# Patient Record
Sex: Male | Born: 1970 | Race: White | Hispanic: No | Smoking: Current every day smoker
Health system: Southern US, Community
[De-identification: ages and names within clinical notes are randomized; demographics above are authoritative.]

## PROBLEM LIST (undated history)

## (undated) ENCOUNTER — Emergency Department (HOSPITAL_COMMUNITY): Discharge status: Absent without leave | Payer: Self-pay

## (undated) DIAGNOSIS — F419 Anxiety disorder, unspecified: Secondary | ICD-10-CM

## (undated) DIAGNOSIS — I10 Essential (primary) hypertension: Secondary | ICD-10-CM

## (undated) DIAGNOSIS — M1990 Unspecified osteoarthritis, unspecified site: Secondary | ICD-10-CM

## (undated) DIAGNOSIS — M958 Other specified acquired deformities of musculoskeletal system: Secondary | ICD-10-CM

## (undated) DIAGNOSIS — G8929 Other chronic pain: Secondary | ICD-10-CM

## (undated) DIAGNOSIS — I639 Cerebral infarction, unspecified: Secondary | ICD-10-CM

## (undated) DIAGNOSIS — M76821 Posterior tibial tendinitis, right leg: Secondary | ICD-10-CM

## (undated) DIAGNOSIS — M25371 Other instability, right ankle: Secondary | ICD-10-CM

## (undated) DIAGNOSIS — I739 Peripheral vascular disease, unspecified: Secondary | ICD-10-CM

## (undated) DIAGNOSIS — G4700 Insomnia, unspecified: Secondary | ICD-10-CM

## (undated) DIAGNOSIS — M7671 Peroneal tendinitis, right leg: Secondary | ICD-10-CM

## (undated) HISTORY — DX: Other chronic pain: G89.29

## (undated) HISTORY — DX: Anxiety disorder, unspecified: F41.9

## (undated) HISTORY — PX: LEG SURGERY: SHX1003

## (undated) HISTORY — PX: HERNIA REPAIR: SHX51

---
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
## 2003-02-04 ENCOUNTER — Emergency Department (HOSPITAL_COMMUNITY): Admission: EM | Admit: 2003-02-04 | Discharge: 2003-02-04 | Payer: Self-pay | Admitting: Emergency Medicine

## 2003-02-15 ENCOUNTER — Ambulatory Visit (HOSPITAL_BASED_OUTPATIENT_CLINIC_OR_DEPARTMENT_OTHER): Admission: RE | Admit: 2003-02-15 | Discharge: 2003-02-15 | Payer: Self-pay | Admitting: General Surgery

## 2003-03-10 ENCOUNTER — Inpatient Hospital Stay (HOSPITAL_COMMUNITY): Admission: EM | Admit: 2003-03-10 | Discharge: 2003-03-13 | Payer: Self-pay | Admitting: Emergency Medicine

## 2004-08-10 ENCOUNTER — Emergency Department (HOSPITAL_COMMUNITY): Admission: EM | Admit: 2004-08-10 | Discharge: 2004-08-10 | Payer: Self-pay | Admitting: Emergency Medicine

## 2004-09-05 ENCOUNTER — Emergency Department (HOSPITAL_COMMUNITY): Admission: EM | Admit: 2004-09-05 | Discharge: 2004-09-05 | Payer: Self-pay | Admitting: Emergency Medicine

## 2005-10-02 IMAGING — CR DG CLAVICLE*R*
2 series · 2 of 2 positions shown · non-contrast
Comparison: None.

CLINICAL DATA: Fall three weeks ago.  Now with right shoulder and neck pain.
 RIGHT CLAVICLE ? 2 VIEW:

[w clavicle ap right *]
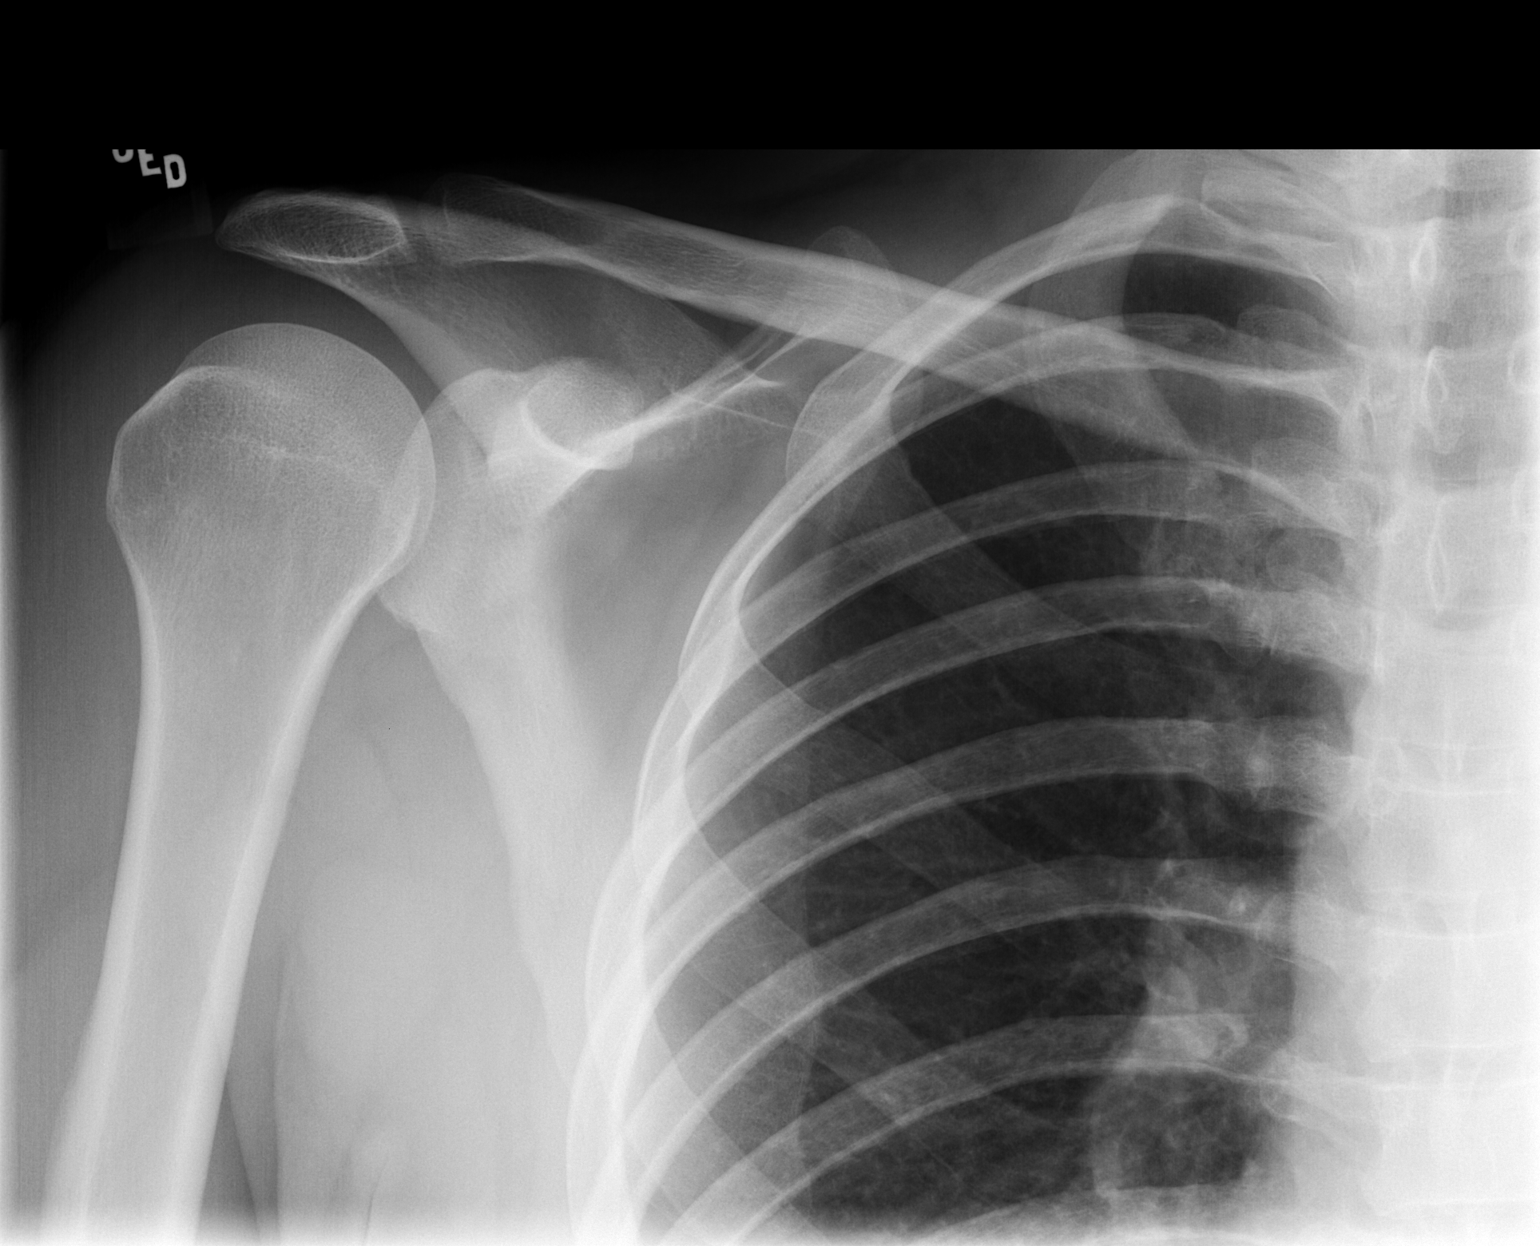

[w clavicle tangential right *]
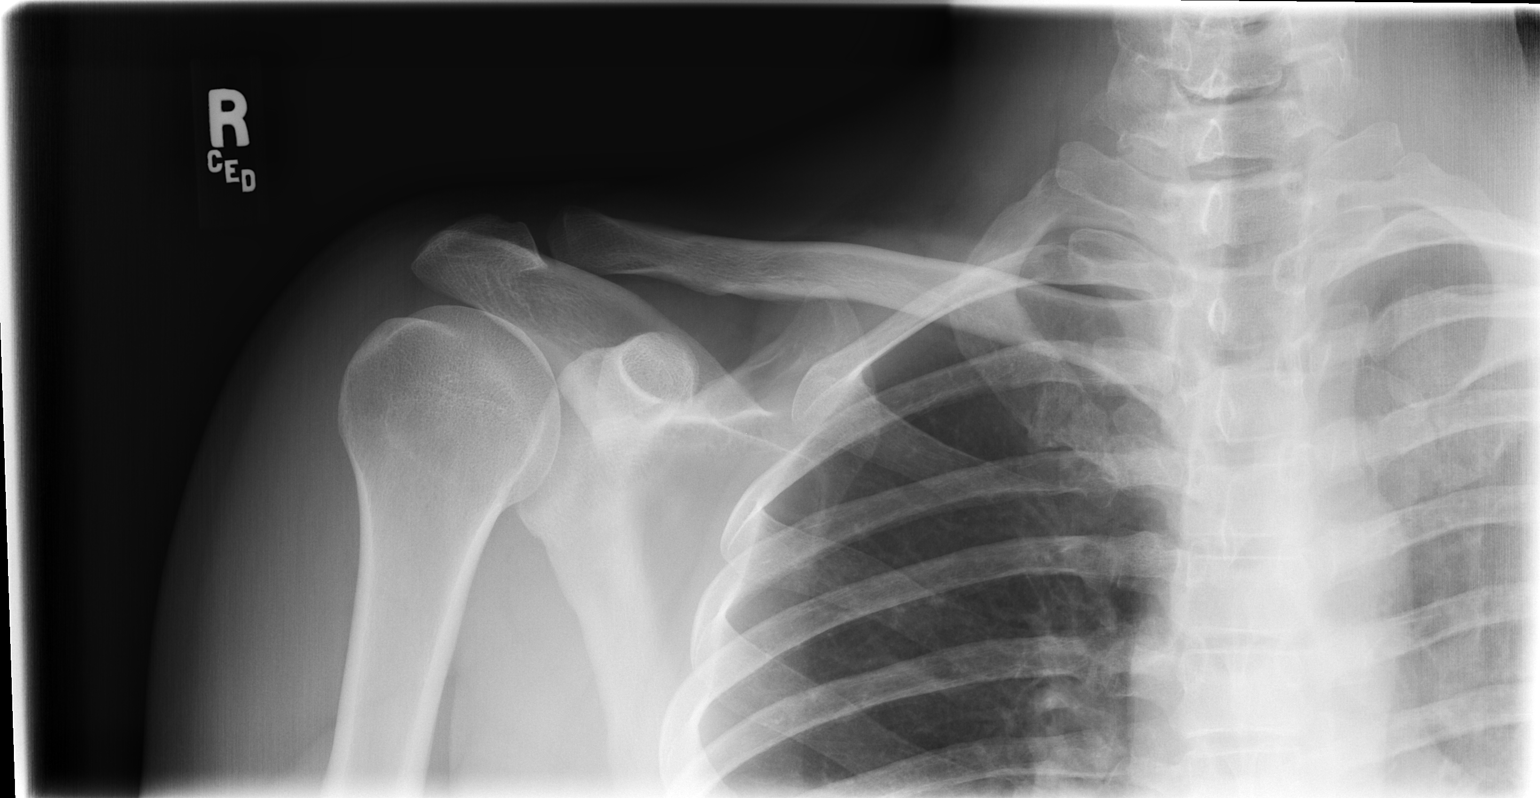

[2 of 2 positions shown; findings below may reference images not displayed]

FINDINGS: No fractures or dislocations are identified.
IMPRESSION: Normal right shoulder.

## 2008-03-17 ENCOUNTER — Emergency Department (HOSPITAL_COMMUNITY): Admission: EM | Admit: 2008-03-17 | Discharge: 2008-03-17 | Payer: Self-pay | Admitting: Emergency Medicine

## 2008-05-22 ENCOUNTER — Emergency Department (HOSPITAL_COMMUNITY): Admission: EM | Admit: 2008-05-22 | Discharge: 2008-05-22 | Payer: Self-pay | Admitting: Emergency Medicine

## 2009-04-24 ENCOUNTER — Emergency Department (HOSPITAL_COMMUNITY): Admission: EM | Admit: 2009-04-24 | Discharge: 2009-04-24 | Payer: Self-pay | Admitting: Emergency Medicine

## 2009-08-14 ENCOUNTER — Emergency Department (HOSPITAL_COMMUNITY): Admission: EM | Admit: 2009-08-14 | Discharge: 2009-08-14 | Payer: Self-pay | Admitting: Emergency Medicine

## 2010-07-07 LAB — POCT I-STAT, CHEM 8
Calcium, Ion: 0.87 mmol/L — ABNORMAL LOW (ref 1.12–1.32)
Chloride: 109 mEq/L (ref 96–112)
Glucose, Bld: 61 mg/dL — ABNORMAL LOW (ref 70–99)
HCT: 46 % (ref 39.0–52.0)
Hemoglobin: 15.6 g/dL (ref 13.0–17.0)
Sodium: 134 mEq/L — ABNORMAL LOW (ref 135–145)
TCO2: 21 mmol/L (ref 0–100)

## 2010-07-07 LAB — CBC
HCT: 42.5 % (ref 39.0–52.0)
Hemoglobin: 14.3 g/dL (ref 13.0–17.0)
MCHC: 33.7 g/dL (ref 30.0–36.0)
Platelets: 182 10*3/uL (ref 150–400)
RDW: 13.4 % (ref 11.5–15.5)
WBC: 12.2 10*3/uL — ABNORMAL HIGH (ref 4.0–10.5)

## 2010-07-07 LAB — DIFFERENTIAL
Basophils Relative: 0 % (ref 0–1)
Lymphs Abs: 2.9 10*3/uL (ref 0.7–4.0)
Monocytes Relative: 9 % (ref 3–12)
Neutrophils Relative %: 65 % (ref 43–77)

## 2010-09-02 ENCOUNTER — Emergency Department (HOSPITAL_COMMUNITY): Payer: Self-pay

## 2010-09-02 ENCOUNTER — Inpatient Hospital Stay (HOSPITAL_COMMUNITY): Payer: Self-pay

## 2010-09-02 ENCOUNTER — Inpatient Hospital Stay (HOSPITAL_COMMUNITY)
Admission: EM | Admit: 2010-09-02 | Discharge: 2010-09-04 | DRG: 494 | Disposition: A | Discharge status: Home Health | Payer: Self-pay | Source: Ambulatory Visit | Attending: Orthopedic Surgery | Admitting: Orthopedic Surgery

## 2010-09-02 DIAGNOSIS — S60229A Contusion of unspecified hand, initial encounter: Secondary | ICD-10-CM | POA: Diagnosis present

## 2010-09-02 DIAGNOSIS — G40909 Epilepsy, unspecified, not intractable, without status epilepticus: Secondary | ICD-10-CM | POA: Diagnosis present

## 2010-09-02 DIAGNOSIS — S72309A Unspecified fracture of shaft of unspecified femur, initial encounter for closed fracture: Principal | ICD-10-CM | POA: Diagnosis present

## 2010-09-02 DIAGNOSIS — F1010 Alcohol abuse, uncomplicated: Secondary | ICD-10-CM | POA: Diagnosis present

## 2010-09-02 DIAGNOSIS — F17200 Nicotine dependence, unspecified, uncomplicated: Secondary | ICD-10-CM | POA: Diagnosis present

## 2010-09-02 LAB — ETHANOL: Alcohol, Ethyl (B): 144 mg/dL — ABNORMAL HIGH (ref 0–10)

## 2010-09-02 LAB — SAMPLE TO BLOOD BANK

## 2010-09-02 LAB — COMPREHENSIVE METABOLIC PANEL
AST: 25 U/L (ref 0–37)
Albumin: 3.7 g/dL (ref 3.5–5.2)
Alkaline Phosphatase: 131 U/L — ABNORMAL HIGH (ref 39–117)
Creatinine, Ser: 0.97 mg/dL (ref 0.4–1.5)
GFR calc non Af Amer: >60 mL/min (ref >60)
Glucose, Bld: 150 mg/dL — ABNORMAL HIGH (ref 70–99)
Sodium: 141 mEq/L (ref 135–145)
Total Bilirubin: 0.2 mg/dL — ABNORMAL LOW (ref 0.3–1.2)
Total Protein: 6.8 g/dL (ref 6.0–8.3)

## 2010-09-02 LAB — POCT I-STAT, CHEM 8
Chloride: 107 meq/L (ref 96–112)
Glucose, Bld: 150 mg/dL — ABNORMAL HIGH (ref 70–99)
HCT: 48 % (ref 39.0–52.0)

## 2010-09-02 LAB — PROTIME-INR: Prothrombin Time: 13.1 seconds (ref 11.6–15.2)

## 2010-09-02 LAB — CBC
Hemoglobin: 15.8 g/dL (ref 13.0–17.0)
MCH: 33.4 pg (ref 26.0–34.0)
RDW: 12.7 % (ref 11.5–15.5)
WBC: 21.6 10*3/uL — ABNORMAL HIGH (ref 4.0–10.5)

## 2010-09-02 LAB — LACTIC ACID, PLASMA: Lactic Acid, Venous: 2 mmol/L (ref 0.5–2.2)

## 2010-09-02 LAB — SURGICAL PCR SCREEN
MRSA, PCR: NEGATIVE
Staphylococcus aureus: NEGATIVE

## 2010-09-02 MED ORDER — IOHEXOL 300 MG/ML  SOLN
100.0000 mL | Freq: Once | INTRAMUSCULAR | Status: AC | PRN
  Administered 2010-09-02: 100 mL via INTRAVENOUS

## 2010-09-03 LAB — BASIC METABOLIC PANEL
BUN: 8 mg/dL (ref 6–23)
Calcium: 7.5 mg/dL — ABNORMAL LOW (ref 8.4–10.5)
GFR calc non Af Amer: >60 mL/min (ref >60)
Glucose, Bld: 153 mg/dL — ABNORMAL HIGH (ref 70–99)
Potassium: 3.7 mEq/L (ref 3.5–5.1)

## 2010-09-03 LAB — CBC
HCT: 32.9 % — ABNORMAL LOW (ref 39.0–52.0)
MCHC: 33.4 g/dL (ref 30.0–36.0)
MCV: 95.6 fL (ref 78.0–100.0)
RDW: 13.1 % (ref 11.5–15.5)

## 2010-09-04 LAB — BASIC METABOLIC PANEL WITH GFR
BUN: 5 mg/dL — ABNORMAL LOW (ref 6–23)
CO2: 27 meq/L (ref 19–32)
Calcium: 7.9 mg/dL — ABNORMAL LOW (ref 8.4–10.5)
Chloride: 102 meq/L (ref 96–112)
Creatinine, Ser: 0.66 mg/dL (ref 0.4–1.5)
GFR calc non Af Amer: >60 mL/min
Glucose, Bld: 102 mg/dL — ABNORMAL HIGH (ref 70–99)
Potassium: 3.7 meq/L (ref 3.5–5.1)
Sodium: 136 meq/L (ref 135–145)

## 2010-09-04 LAB — CBC
HCT: 30.1 % — ABNORMAL LOW (ref 39.0–52.0)
Hemoglobin: 10.5 g/dL — ABNORMAL LOW (ref 13.0–17.0)
MCH: 32.7 pg (ref 26.0–34.0)
MCHC: 34.9 g/dL (ref 30.0–36.0)
MCV: 93.8 fL (ref 78.0–100.0)
Platelets: 159 K/uL (ref 150–400)
RBC: 3.21 MIL/uL — ABNORMAL LOW (ref 4.22–5.81)
RDW: 12.7 % (ref 11.5–15.5)
WBC: 8.5 K/uL (ref 4.0–10.5)

## 2010-09-04 NOTE — Op Note (Signed)
NAME:  Aaron Kennedy, Aaron Kennedy                        ACCOUNT NO.:  [id number]   MEDICAL RECORD NO.:  [id number]                   PATIENT TYPE:  AMB   LOCATION:  DSC                                  FACILITY:  [facility]   PHYSICIAN:  Gita Kudo, M.D.              DATE OF BIRTH:  05/20/1970   DATE OF PROCEDURE:  02/15/2003  DATE OF DISCHARGE:                                 OPERATIVE REPORT   PROCEDURE:  Repair of left inguinal hernia - direct and indirect, with  modified Kugel two-layer mesh.   SURGEON:  Gita Kudo, M.D.   ANESTHESIA:  General endotracheal.   PREOPERATIVE DIAGNOSIS:  Left inguinal hernia.   POSTOPERATIVE DIAGNOSIS:  Left inguinal hernia.  Combined direct and  indirect.   INDICATIONS FOR PROCEDURE:  A 40 year old male with groin bulge for at least  two years' duration.  Comes in for elective repair after verifying the  hernia on physical examination.   FINDINGS:  The patient had a very large indirect hernia with a very large  sac and the contents could be totally reduced.  He also had a medium-size  direct hernia.   DESCRIPTION OF PROCEDURE:  Under satisfactory general anesthesia, having  received 1.0 gram Ancef preoperatively, the patient's abdomen was prepped  and draped in the usual sterile fashion.  A total of 30 mL of 0.5% Marcaine  with epinephrine was infiltrated during the procedure for postoperative  analgesia.  A transverse incision was made and carried down to and through  the external ring and external oblique.  Bleeders were either tied with 3-0  Vicryl or coagulated.  The cord and its contents were mobilized with the  Penrose drain and the large hernia sac identified, dissected, and ligated  high with 0 Prolene suture.  The cord structures and nerves were identified  throughout the procedure and not injured.  Then the floor of the canal was  opened from the pubis to the internal ring and the inferior epigastric  vessels identified and  divided between clamps and ties of 3-0 Vicryl.  This  was done to allow good access to the preperitoneal space.  The preperitoneal  space was then developed by finger dissection and the contents held away  with a moistened gauze.   The circular portion of the Kugel mesh was then anchored to Cooper's  ligament with a 0 Prolene suture and unfolded laterally and medially.  Then,  the mesh was placed superiorly and medially after the moistened gauze was  removed.  The mesh lay in good position, well deployed.  A second suture was  used to approximate the mesh to the undersurface of the abdominal wall  medially and then the floor closed over the mesh taking intermittent bites  of the mesh with a running 0 Prolene which when tied at the internal ring,  rendered the ring snug and the ends were left long.  Then  the oval portion  of the mesh was tailored to fit into the floor and a slit made to go around  the cord.  It was then anchored at the internal ring with the previous  suture and tacked around the periphery with three sutures - 0 Prolene - to  the internal oblique, soft tissue near the pubis, inguinal ligament.  The  towels were then brought around the cord structures and sutured to each  other.  The wound was lavaged with saline, made hemostatic by cautery, and  then closed in layers with running 2-0 Vicryl for the external oblique, 2-0  Vicryl for the deep fascia, 3-0 Vicryl for subcu, Steri-Strips for skin.  Sterile absorbent dressing applied and the patient went to the recovery room  from the operating room in good condition.                                               Gita Kudo, M.D.    MRL/MEDQ  D:  02/15/2003  T:  02/15/2003  Job:  [id number]

## 2010-09-04 NOTE — Consult Note (Signed)
NAME:  Aaron Kennedy, Aaron Kennedy                        ACCOUNT NO.:  [id number]   MEDICAL RECORD NO.:  [id number]                   PATIENT TYPE:  INP   LOCATION:  5016                                 FACILITY:  [facility]   PHYSICIAN:  Artist Pais. Mina Marble, M.D.           DATE OF BIRTH:  1971/01/22   DATE OF CONSULTATION:  03/10/2003  DATE OF DISCHARGE:                                   CONSULTATION   REQUESTING PHYSICIAN:  Doug Sou, M.D.   REASON FOR CONSULTATION:  Aaron Kennedy is a 40 year old right-hand  dominate male who presented today with bilateral lesions on his hands as  well as status post left inguinal hernia repair 3 weeks ago by Dr. Jerelene Redden with some drainage and some lesions in the vicinity of his incision  for the last week. He is an otherwise  healthy 40 year old male.   ALLERGIES:  No known drug allergies.   CURRENT MEDICATIONS:  None except Percocet.   PAST MEDICAL HISTORY:  No recent hospitalizations or surgeries except for  the previously mentioned inguinal hernia repair around 3 weeks ago.   FAMILY HISTORY:  Noncontributory.   SOCIAL HISTORY:  Occasional alcohol  and tobacco use.   PHYSICAL EXAMINATION:  GENERAL:  A well nourished male, pleasant, alert and  oriented x3.  EXTREMITIES:  Examination of his hands, he has bilateral lesions on his  right hand over the ulnar border of the 5th digit proximal phalanx. He has a  small lesion that was cultured by the ER staff that measures approximately 1  x 1. It is raised, red and again was opened and cultured by the  ER staff.  On his left hand he has a similar lesion over the left 5th digit ulnar side  at the proximal phalangeal level. It is raised, erythematous but no drainage  was noted. He also has a lesion on the long finger over the middle phalanx,  left hand, which is raised about 5 x 6 mm, erythematous but no drainage  noted. No streaks up the arms or adenopathy  bilaterally. His inguinal  hernia repair  is somewhat red and his bandage has some yellowish type  drainage and he says this has been draining for the past week or so  intermittently.   LABORATORY DATA:  White count was drawn and pending.   IMPRESSION:  A 40 year old male with possible methicillin resistant Staph  aureus infection on his hands and probably secondary to a infection to his  hernia repair. Recommendations at this point in time, his wound was  cultured. I am going to admit him for IV vancomycin for a presumed  methicillin resistant Staph aureus infection. I am going to have the general  surgeon on call for Dr. Jerelene Redden look at his left hernia incision and  will await his cultures, white count, etc.  Artist Pais Mina Marble, M.D.   MAW/MEDQ  D:  03/10/2003  T:  03/10/2003  Job:  [id number]

## 2010-09-04 NOTE — Discharge Summary (Signed)
NAME:  Aaron Kennedy, Aaron Kennedy                        ACCOUNT NO.:  [id number]   MEDICAL RECORD NO.:  [id number]                   PATIENT TYPE:  INP   LOCATION:  5016                                 FACILITY:  [facility]   PHYSICIAN:  Artist Pais. Mina Marble, M.D.           DATE OF BIRTH:  February 19, 1971   DATE OF ADMISSION:  03/10/2003  DATE OF DISCHARGE:  03/13/2003                                 DISCHARGE SUMMARY   ADMITTING DIAGNOSES:  1. Methicillin-resistant Staphylococcus aureus infection bilateral hands.  2. Status post left inguinal hernia repair three weeks ago by Dr. Gita Kudo.   DISCHARGE DIAGNOSES:  1. Methicillin-resistant Staphylococcus aureus infection bilateral hands.  2. Status post left inguinal hernia repair three weeks ago by Dr. Gita Kudo.   DISCHARGE CONDITION:  Stable and improved.   BRIEF HISTORY:  The patient is a 40 year old right-hand dominant white male  who was seen in the emergency room with bilateral lesions on his hands,  status post left inguinal hernia repair by Dr. Gita Kudo.  He has  some drainage from the lesions, and he also has some drainage and redness in  his incision area of his left inguinal hernia repair.  Dr. Dairl Ponder  was consulted from the emergency room for evaluation of his hand lesions.   PERTINENT LABS:  WBC 10.0 on November 21.  On November 23, it was 9.8.  Hemoglobin 13.8, hematocrit 40.1.  Sodium 139, potassium 4.0.  Cultures were  positive for methicillin-resistant staphylococcus aureus from his right-hand  culture.   HOSPITAL COURSE:  The patient was admitted on March 10, 2003 and placed  on IV vancomycin for methicillin-resistant staphylococcus aureus infection.  Dr. Gita Kudo was consulted for evaluation of his left inguinal  hernia incision.  Infectious disease was consulted for assistance with  antibiotic coverage.  On hospital day number 1, the patient was feeling  better.  He had  decreased swelling of his hands, decreased redness.  Several  small pustules on his hands were not draining anything.  He had increased  range of motion bilateral hands.  WBC was 15.0, hemoglobin 13.8, hematocrit  40.1, platelet count 421,000.  He continued on his IV vancomycin.  Infectious disease was consulted.  He remained afebrile.  Vital signs  stable.  He was ready for discharge home on March 13, 2003.  IV medicines  were discontinued.   DISCHARGE MEDICATIONS:  He was discharged home on p.o. doxycycline 100 mg  b.i.d. times two weeks.   FOLLOWUP:  1. On the following Tuesday with Dr. Molli Hazard A. Weingold in their office     [phone]), call for an appointment.  2. He is to follow up with Dr. Gita Kudo regarding his left inguinal     hernia repair.  3. Contact our office prior to followup if he has any questions or concerns.   DISCHARGE  INSTRUCTIONS:  Keep lesions clean and dry.   DISCHARGE CONDITION:  Stable and improved.      Joellyn Rued, P.A. [facility]                    Matthew A. Mina Marble, M.D.    EW/MEDQ  D:  05/08/2003  T:  05/09/2003  Job:  [id number]
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
## 2011-05-18 ENCOUNTER — Telehealth: Payer: Self-pay

## 2011-05-18 NOTE — Telephone Encounter (Signed)
.  [facility] Pt requests refill on tramadol Uses [facility] on [address]: [phone]  bf

## 2011-05-19 NOTE — Telephone Encounter (Signed)
Spoke with pt and informed RF was sent to pharmacy yesterday by computer. Pt thanked [place].

## 2011-06-18 ENCOUNTER — Other Ambulatory Visit: Payer: Self-pay | Admitting: Family Medicine

## 2011-07-19 ENCOUNTER — Other Ambulatory Visit: Payer: Self-pay | Admitting: Physician Assistant

## 2011-08-19 ENCOUNTER — Other Ambulatory Visit: Payer: Self-pay | Admitting: Physician Assistant

## 2011-09-19 ENCOUNTER — Other Ambulatory Visit: Payer: Self-pay | Admitting: Physician Assistant

## 2012-04-28 ENCOUNTER — Other Ambulatory Visit: Payer: Self-pay | Admitting: Family Medicine

## 2012-04-28 NOTE — Telephone Encounter (Signed)
Pt has questions about refill for amitriptolyne  Pharmacy: [facility] [address]  bf

## 2012-04-28 NOTE — Telephone Encounter (Signed)
What are pt's questions?

## 2012-05-05 NOTE — Telephone Encounter (Signed)
Called patient. Unsure why this has not been called sooner. Message was in Rx pool. He states Dr Milus Glazier normally gives him a 1 yr supply. He states he has no questions, he just needs a refill on this please advise.

## 2012-06-13 ENCOUNTER — Other Ambulatory Visit: Payer: Self-pay | Admitting: Family Medicine

## 2012-06-14 ENCOUNTER — Other Ambulatory Visit: Payer: Self-pay | Admitting: Family Medicine

## 2012-07-07 ENCOUNTER — Other Ambulatory Visit: Payer: Self-pay | Admitting: Family Medicine

## 2012-07-08 ENCOUNTER — Telehealth: Payer: Self-pay

## 2012-07-08 NOTE — Telephone Encounter (Signed)
Pt is needing Rx refill on MRSA Medication, the pharmacy has already faxed over a request.

## 2012-07-08 NOTE — Telephone Encounter (Signed)
Does pt need to RTC? 

## 2012-07-08 NOTE — Telephone Encounter (Signed)
Needs OV, hasn't been seen in Epic

## 2012-07-09 NOTE — Telephone Encounter (Signed)
Spoke with pt advised to RTC. Pt understood.

## 2013-02-06 ENCOUNTER — Encounter (HOSPITAL_COMMUNITY): Payer: Self-pay | Admitting: Emergency Medicine

## 2013-02-06 ENCOUNTER — Emergency Department (HOSPITAL_COMMUNITY)
Admission: EM | Admit: 2013-02-06 | Discharge: 2013-02-06 | Disposition: A | Discharge status: Home / Self Care | Payer: No Typology Code available for payment source | Attending: Emergency Medicine | Admitting: Emergency Medicine

## 2013-02-06 DIAGNOSIS — R51 Headache: Secondary | ICD-10-CM | POA: Insufficient documentation

## 2013-02-06 DIAGNOSIS — F17200 Nicotine dependence, unspecified, uncomplicated: Secondary | ICD-10-CM | POA: Insufficient documentation

## 2013-02-06 DIAGNOSIS — I10 Essential (primary) hypertension: Secondary | ICD-10-CM | POA: Insufficient documentation

## 2013-02-06 DIAGNOSIS — Z79899 Other long term (current) drug therapy: Secondary | ICD-10-CM | POA: Insufficient documentation

## 2013-02-06 HISTORY — DX: Essential (primary) hypertension: I10

## 2013-02-06 LAB — CBC WITH DIFFERENTIAL/PLATELET
Basophils Absolute: 0 10*3/uL (ref 0.0–0.1)
Basophils Relative: 0 % (ref 0–1)
Eosinophils Absolute: 0.3 10*3/uL (ref 0.0–0.7)
Eosinophils Relative: 3 % (ref 0–5)
HCT: 43.9 % (ref 39.0–52.0)
Hemoglobin: 15.5 g/dL (ref 13.0–17.0)
Lymphocytes Relative: 42 % (ref 12–46)
Lymphs Abs: 4.8 10*3/uL — ABNORMAL HIGH (ref 0.7–4.0)
MCH: 32.9 pg (ref 26.0–34.0)
MCHC: 35.3 g/dL (ref 30.0–36.0)
MCV: 93.2 fL (ref 78.0–100.0)
Monocytes Absolute: 0.9 10*3/uL (ref 0.1–1.0)
Monocytes Relative: 8 % (ref 3–12)
Neutro Abs: 5.4 10*3/uL (ref 1.7–7.7)
Neutrophils Relative %: 47 % (ref 43–77)
Platelets: 204 10*3/uL (ref 150–400)
RBC: 4.71 MIL/uL (ref 4.22–5.81)
RDW: 12.9 % (ref 11.5–15.5)
WBC: 11.4 10*3/uL — ABNORMAL HIGH (ref 4.0–10.5)

## 2013-02-06 LAB — COMPREHENSIVE METABOLIC PANEL
ALT: 20 U/L (ref 0–53)
AST: 15 U/L (ref 0–37)
Albumin: 3.9 g/dL (ref 3.5–5.2)
Alkaline Phosphatase: 135 U/L — ABNORMAL HIGH (ref 39–117)
BUN: 9 mg/dL (ref 6–23)
CO2: 24 mEq/L (ref 19–32)
Calcium: 9 mg/dL (ref 8.4–10.5)
Chloride: 106 mEq/L (ref 96–112)
Creatinine, Ser: 0.92 mg/dL (ref 0.50–1.35)
GFR calc Af Amer: >90 mL/min (ref >90)
GFR calc non Af Amer: >90 mL/min (ref >90)
Glucose, Bld: 109 mg/dL — ABNORMAL HIGH (ref 70–99)
Potassium: 3.7 mEq/L (ref 3.5–5.1)
Sodium: 141 mEq/L (ref 135–145)
Total Bilirubin: 0.3 mg/dL (ref 0.3–1.2)
Total Protein: 7.1 g/dL (ref 6.0–8.3)

## 2013-02-06 MED ORDER — AMLODIPINE BESYLATE 5 MG PO TABS
5.0000 mg | ORAL_TABLET | Freq: Once | ORAL | Status: AC
  Administered 2013-02-06: 5 mg via ORAL
  Filled 2013-02-06 (×3): qty 1

## 2013-02-06 MED ORDER — OXYCODONE-ACETAMINOPHEN 5-325 MG PO TABS: 1.0000 | ORAL_TABLET | ORAL | Status: DC | PRN

## 2013-02-06 NOTE — ED Notes (Signed)
MD Kohut at bedside. 

## 2013-02-06 NOTE — ED Provider Notes (Signed)
CSN: [id number]     Arrival date & time 02/06/13  1914 History   First MD Initiated Contact with Patient 02/06/13 2023     Chief Complaint  Patient presents with  . Hypertension   (Consider location/radiation/quality/duration/timing/severity/associated sxs/prior Treatment) HPI  42 year old with hypertension. Long-standing. Reports that recently his systolic blood pressures have been consistently in the 200s. Has been seen by his primary care provider recently for the same as her amlodipine. He reports only intermittent complaints though. Occasional mild headaches for the past day. Does not say that they're particularly severe and he has no neurological complaints otherwise. No chest pain or shortness of breath. No peripheral edema. No urinary complaints. Is a smoker.  Past Medical History  Diagnosis Date  . Hypertension    Past Surgical History  Procedure Laterality Date  . Hernia repair    . Leg surgery     No family history on file. History  Substance Use Topics  . Smoking status: Current Every Day Smoker  . Smokeless tobacco: Not on file  . Alcohol Use: Yes    Review of Systems  All systems reviewed and negative, other than as noted in HPI.   Allergies  Review of patient's allergies indicates no known allergies.  Home Medications   Current Outpatient Rx  Name  Route  Sig  Dispense  Refill  . acetaminophen (TYLENOL) 325 MG tablet   Oral   Take 650 mg by mouth every 6 (six) hours as needed for pain.         Marland Kitchen amitriptyline (ELAVIL) 50 MG tablet   Oral   Take 150 mg by mouth at bedtime.         Marland Kitchen amLODipine (NORVASC) 5 MG tablet   Oral   Take 5 mg by mouth daily.         Marland Kitchen gabapentin (NEURONTIN) 600 MG tablet   Oral   Take 600 mg by mouth 2 (two) times daily.          BP 187/135  Pulse 80  Temp(Src) 98.1 F (36.7 C) (Oral)  Resp 21  Ht 5\' 11"  (1.803 m)  Wt 220 lb (99.791 kg)  BMI 30.7 kg/m2  SpO2 98% Physical Exam  Nursing note and vitals  reviewed. Constitutional: He is oriented to person, place, and time. He appears well-developed and well-nourished. No distress.  HENT:  Head: Normocephalic and atraumatic.  Eyes: Conjunctivae are normal. Right eye exhibits no discharge. Left eye exhibits no discharge.  Neck: Normal range of motion. Neck supple.  Cardiovascular: Normal rate, regular rhythm and normal heart sounds.  Exam reveals no gallop and no friction rub.   No murmur heard. Pulmonary/Chest: Effort normal and breath sounds normal. No respiratory distress.  Abdominal: Soft. He exhibits no distension. There is no tenderness.  Musculoskeletal: He exhibits no edema and no tenderness.  Neurological: He is alert and oriented to person, place, and time. No cranial nerve deficit. He exhibits normal muscle tone. Coordination normal.  Skin: Skin is warm and dry.  Psychiatric: He has a normal mood and affect. His behavior is normal. Thought content normal.    ED Course  Procedures (including critical care time) Labs Review Labs Reviewed  CBC WITH DIFFERENTIAL - Abnormal; Notable for the following:    WBC 11.4 (*)    Lymphs Abs 4.8 (*)    All other components within normal limits  COMPREHENSIVE METABOLIC PANEL - Abnormal; Notable for the following:    Glucose, Bld 109 (*)  Alkaline Phosphatase 135 (*)    All other components within normal limits   Imaging Review No results found.  EKG Interpretation   None       MDM   1. Hypertension    42 year old male with asymptomatic hypertension. He has had an occasional mild headache over the past several days, but I'm not convinced that this is necessarily related to his hypertension. He has a nonfocal neurological examination. Discussed at length the need for blood pressure control and long-term complications of it. Stressed the importance of compliance with medicines. Discussed that he may potentially need further agents for increased dose of his amlodipine but that he needs  to take his medicines regularly before anyone can titrate them. Return precautions were discussed. Outpatient followup otherwise.    Raeford Razor, MD 02/08/13 [phone]

## 2013-02-06 NOTE — ED Notes (Signed)
Pt. reports elevated blood pressure with headache for past several days , pt.stated he does not take his antihypertensive medication regularly. Alert and oriented / respirations unlabored .

## 2013-05-06 ENCOUNTER — Other Ambulatory Visit: Payer: Self-pay | Admitting: Family Medicine

## 2013-05-11 ENCOUNTER — Other Ambulatory Visit: Payer: Self-pay | Admitting: Family Medicine

## 2013-05-14 ENCOUNTER — Telehealth: Payer: Self-pay

## 2013-05-14 DIAGNOSIS — G4700 Insomnia, unspecified: Secondary | ICD-10-CM

## 2013-05-14 NOTE — Telephone Encounter (Signed)
Patient says that he had a refill on his amitriptyline and it was at the pharmacy for him, he was out of town working and could not pick it up, now we have to approve the med. He does know that he needs an ov, so he was transferred to appt office, please call and let him know if we can approve this [phone]

## 2013-05-14 NOTE — Telephone Encounter (Signed)
I was trying to explain to pt that he needed to come in for an ov. It has been over a year that the patient has been in to see a dr. He called in a refill at his pharmacy and was unable to pick it up because the prescription has expired. They need us to send in a refill medication.    Patient hung up on me.

## 2013-05-15 MED ORDER — AMITRIPTYLINE HCL 50 MG PO TABS: 150.0000 mg | ORAL_TABLET | Freq: Every day | ORAL | Status: DC

## 2013-05-15 NOTE — Telephone Encounter (Signed)
Spoke with pt, he has an appointment on 05/24/2013 at 11:00 am with Dr Milus Glazier. Can he have a refill until then so he wont run out of his medication Amitriptyline.

## 2013-05-24 ENCOUNTER — Encounter: Payer: Self-pay | Admitting: Family Medicine

## 2013-05-24 ENCOUNTER — Ambulatory Visit: Payer: Self-pay | Admitting: Family Medicine

## 2013-05-24 VITALS — BP 180/130 | HR 72 | Temp 98.9°F | Resp 16 | Ht 71.0 in | Wt 222.0 lb

## 2013-05-24 DIAGNOSIS — G4700 Insomnia, unspecified: Secondary | ICD-10-CM

## 2013-05-24 DIAGNOSIS — M79606 Pain in leg, unspecified: Secondary | ICD-10-CM

## 2013-05-24 DIAGNOSIS — B009 Herpesviral infection, unspecified: Secondary | ICD-10-CM

## 2013-05-24 DIAGNOSIS — M79609 Pain in unspecified limb: Secondary | ICD-10-CM

## 2013-05-24 DIAGNOSIS — G8929 Other chronic pain: Secondary | ICD-10-CM

## 2013-05-24 DIAGNOSIS — I10 Essential (primary) hypertension: Secondary | ICD-10-CM

## 2013-05-24 MED ORDER — AMITRIPTYLINE HCL 50 MG PO TABS: 150.0000 mg | ORAL_TABLET | Freq: Every day | ORAL | Status: DC

## 2013-05-24 MED ORDER — OXYCODONE-ACETAMINOPHEN 5-325 MG PO TABS: 1.0000 | ORAL_TABLET | Freq: Two times a day (BID) | ORAL | Status: DC | PRN

## 2013-05-24 MED ORDER — AMLODIPINE BESYLATE 10 MG PO TABS: 10.0000 mg | ORAL_TABLET | Freq: Every day | ORAL | Status: DC

## 2013-05-24 MED ORDER — ACYCLOVIR 200 MG PO CAPS: 200.0000 mg | ORAL_CAPSULE | Freq: Every morning | ORAL | Status: DC
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
## 2014-05-27 ENCOUNTER — Other Ambulatory Visit: Payer: Self-pay | Admitting: Family Medicine

## 2014-07-24 ENCOUNTER — Ambulatory Visit: Payer: Self-pay

## 2014-10-10 ENCOUNTER — Encounter (HOSPITAL_COMMUNITY): Payer: Self-pay | Admitting: *Deleted

## 2014-10-10 ENCOUNTER — Emergency Department (HOSPITAL_COMMUNITY): Payer: Self-pay

## 2014-10-10 ENCOUNTER — Emergency Department (HOSPITAL_COMMUNITY)
Admission: EM | Admit: 2014-10-10 | Discharge: 2014-10-10 | Disposition: A | Discharge status: Home / Self Care | Payer: Self-pay | Attending: Emergency Medicine | Admitting: Emergency Medicine

## 2014-10-10 DIAGNOSIS — Z720 Tobacco use: Secondary | ICD-10-CM | POA: Insufficient documentation

## 2014-10-10 DIAGNOSIS — Z9889 Other specified postprocedural states: Secondary | ICD-10-CM | POA: Insufficient documentation

## 2014-10-10 DIAGNOSIS — M25569 Pain in unspecified knee: Secondary | ICD-10-CM

## 2014-10-10 DIAGNOSIS — Z79899 Other long term (current) drug therapy: Secondary | ICD-10-CM | POA: Insufficient documentation

## 2014-10-10 DIAGNOSIS — Z87828 Personal history of other (healed) physical injury and trauma: Secondary | ICD-10-CM | POA: Insufficient documentation

## 2014-10-10 DIAGNOSIS — G8929 Other chronic pain: Secondary | ICD-10-CM | POA: Insufficient documentation

## 2014-10-10 DIAGNOSIS — I10 Essential (primary) hypertension: Secondary | ICD-10-CM | POA: Insufficient documentation

## 2014-10-10 DIAGNOSIS — M25562 Pain in left knee: Secondary | ICD-10-CM | POA: Insufficient documentation

## 2014-10-10 NOTE — ED Notes (Signed)
Pt left with all belongings and refused wheelchair. 
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
## 2014-10-10 NOTE — ED Notes (Signed)
Pt to xray
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
## 2014-10-10 NOTE — Discharge Instructions (Signed)
Arthralgia °Your caregiver has diagnosed you as suffering from an arthralgia. Arthralgia means there is pain in a joint. This can come from many reasons including: °· Bruising the joint which causes soreness (inflammation) in the joint. °· Wear and tear on the joints which occur as we grow older (osteoarthritis). °· Overusing the joint. °· Various forms of arthritis. °· Infections of the joint. °Regardless of the cause of pain in your joint, most of these different pains respond to anti-inflammatory drugs and rest. The exception to this is when a joint is infected, and these cases are treated with antibiotics, if it is a bacterial infection. °HOME CARE INSTRUCTIONS  °· Rest the injured area for as long as directed by your caregiver. Then slowly start using the joint as directed by your caregiver and as the pain allows. Crutches as directed may be useful if the ankles, knees or hips are involved. If the knee was splinted or casted, continue use and care as directed. If an stretchy or elastic wrapping bandage has been applied today, it should be removed and re-applied every 3 to 4 hours. It should not be applied tightly, but firmly enough to keep swelling down. Watch toes and feet for swelling, bluish discoloration, coldness, numbness or excessive pain. If any of these problems (symptoms) occur, remove the ace bandage and re-apply more loosely. If these symptoms persist, contact your caregiver or return to this location. °· For the first 24 hours, keep the injured extremity elevated on pillows while lying down. °· Apply ice for 15-20 minutes to the sore joint every couple hours while awake for the first half day. Then 03-04 times per day for the first 48 hours. Put the ice in a plastic bag and place a towel between the bag of ice and your skin. °· Wear any splinting, casting, elastic bandage applications, or slings as instructed. °· Only take over-the-counter or prescription medicines for pain, discomfort, or fever as  directed by your caregiver. Do not use aspirin immediately after the injury unless instructed by your physician. Aspirin can cause increased bleeding and bruising of the tissues. °· If you were given crutches, continue to use them as instructed and do not resume weight bearing on the sore joint until instructed. °Persistent pain and inability to use the sore joint as directed for more than 2 to 3 days are warning signs indicating that you should see a caregiver for a follow-up visit as soon as possible. Initially, a hairline fracture (break in bone) may not be evident on X-rays. Persistent pain and swelling indicate that further evaluation, non-weight bearing or use of the joint (use of crutches or slings as instructed), or further X-rays are indicated. X-rays may sometimes not show a small fracture until a week or 10 days later. Make a follow-up appointment with your own caregiver or one to whom we have referred you. A radiologist (specialist in reading X-rays) may read your X-rays. Make sure you know how you are to obtain your X-ray results. Do not assume everything is normal if you do not hear from us. °SEEK MEDICAL CARE IF: °Bruising, swelling, or pain increases. °SEEK IMMEDIATE MEDICAL CARE IF:  °· Your fingers or toes are numb or blue. °· The pain is not responding to medications and continues to stay the same or get worse. °· The pain in your joint becomes severe. °· You develop a fever over 102° F (38.9° C). °· It becomes impossible to move or use the joint. °MAKE SURE YOU:  °·   Understand these instructions. °· Will watch your condition. °· Will get help right away if you are not doing well or get worse. °Document Released: 04/05/2005 Document Revised: 06/28/2011 Document Reviewed: 11/22/2007 °ExitCare® Patient Information ©2015 [employer]. This information is not intended to replace advice given to you by your health care provider. Make sure you discuss any questions you have with your health care  provider. ° °Knee Pain °The knee is the complex joint between your thigh and your lower leg. It is made up of bones, tendons, ligaments, and cartilage. The bones that make up the knee are: °· The femur in the thigh. °· The tibia and fibula in the lower leg. °· The patella or kneecap riding in the groove on the lower femur. °CAUSES  °Knee pain is a common complaint with many causes. A few of these causes are: °· Injury, such as: °¨ A ruptured ligament or tendon injury. °¨ Torn cartilage. °· Medical conditions, such as: °¨ Gout °¨ Arthritis °¨ Infections °· Overuse, over training, or overdoing a physical activity. °Knee pain can be minor or severe. Knee pain can accompany debilitating injury. Minor knee problems often respond well to self-care measures or get well on their own. More serious injuries may need medical intervention or even surgery. °SYMPTOMS °The knee is complex. Symptoms of knee problems can vary widely. Some of the problems are: °· Pain with movement and weight bearing. °· Swelling and tenderness. °· Buckling of the knee. °· Inability to straighten or extend your knee. °· Your knee locks and you cannot straighten it. °· Warmth and redness with pain and fever. °· Deformity or dislocation of the kneecap. °DIAGNOSIS  °Determining what is wrong may be very straight forward such as when there is an injury. It can also be challenging because of the complexity of the knee. Tests to make a diagnosis may include: °· Your caregiver taking a history and doing a physical exam. °· Routine X-rays can be used to rule out other problems. X-rays will not reveal a cartilage tear. Some injuries of the knee can be diagnosed by: °¨ Arthroscopy a surgical technique by which a small video camera is inserted through tiny incisions on the sides of the knee. This procedure is used to examine and repair internal knee joint problems. Tiny instruments can be used during arthroscopy to repair the torn knee cartilage  (meniscus). °¨ Arthrography is a radiology technique. A contrast liquid is directly injected into the knee joint. Internal structures of the knee joint then become visible on X-ray film. °¨ An MRI scan is a non X-ray radiology procedure in which magnetic fields and a computer produce two- or three-dimensional images of the inside of the knee. Cartilage tears are often visible using an MRI scanner. MRI scans have largely replaced arthrography in diagnosing cartilage tears of the knee. °· Blood work. °· Examination of the fluid that helps to lubricate the knee joint (synovial fluid). This is done by taking a sample out using a needle and a syringe. °TREATMENT °The treatment of knee problems depends on the cause. Some of these treatments are: °· Depending on the injury, proper casting, splinting, surgery, or physical therapy care will be needed. °· Give yourself adequate recovery time. Do not overuse your joints. If you begin to get sore during workout routines, back off. Slow down or do fewer repetitions. °· For repetitive activities such as cycling or running, maintain your strength and nutrition. °· Alternate muscle groups. For example, if you are a   weight lifter, work the upper body on one day and the lower body the next. °· Either tight or weak muscles do not give the proper support for your knee. Tight or weak muscles do not absorb the stress placed on the knee joint. Keep the muscles surrounding the knee strong. °· Take care of mechanical problems. °¨ If you have flat feet, orthotics or special shoes may help. See your caregiver if you need help. °¨ Arch supports, sometimes with wedges on the inner or outer aspect of the heel, can help. These can shift pressure away from the side of the knee most bothered by osteoarthritis. °¨ A brace called an "unloader" brace also may be used to help ease the pressure on the most arthritic side of the knee. °· If your caregiver has prescribed crutches, braces, wraps or ice,  use as directed. The acronym for this is PRICE. This means protection, rest, ice, compression, and elevation. °· Nonsteroidal anti-inflammatory drugs (NSAIDs), can help relieve pain. But if taken immediately after an injury, they may actually increase swelling. Take NSAIDs with food in your stomach. Stop them if you develop stomach problems. Do not take these if you have a history of ulcers, stomach pain, or bleeding from the bowel. Do not take without your caregiver's approval if you have problems with fluid retention, heart failure, or kidney problems. °· For ongoing knee problems, physical therapy may be helpful. °· Glucosamine and chondroitin are over-the-counter dietary supplements. Both may help relieve the pain of osteoarthritis in the knee. These medicines are different from the usual anti-inflammatory drugs. Glucosamine may decrease the rate of cartilage destruction. °· Injections of a corticosteroid drug into your knee joint may help reduce the symptoms of an arthritis flare-up. They may provide pain relief that lasts a few months. You may have to wait a few months between injections. The injections do have a small increased risk of infection, water retention, and elevated blood sugar levels. °· Hyaluronic acid injected into damaged joints may ease pain and provide lubrication. These injections may work by reducing inflammation. A series of shots may give relief for as long as 6 months. °· Topical painkillers. Applying certain ointments to your skin may help relieve the pain and stiffness of osteoarthritis. Ask your pharmacist for suggestions. Many over the-counter products are approved for temporary relief of arthritis pain. °· In some countries, doctors often prescribe topical NSAIDs for relief of chronic conditions such as arthritis and tendinitis. A review of treatment with NSAID creams found that they worked as well as oral medications but without the serious side effects. °PREVENTION °· Maintain a  healthy weight. Extra pounds put more strain on your joints. °· Get strong, stay limber. Weak muscles are a common cause of knee injuries. Stretching is important. Include flexibility exercises in your workouts. °· Be smart about exercise. If you have osteoarthritis, chronic knee pain or recurring injuries, you may need to change the way you exercise. This does not mean you have to stop being active. If your knees ache after jogging or playing basketball, consider switching to swimming, water aerobics, or other low-impact activities, at least for a few days a week. Sometimes limiting high-impact activities will provide relief. °· Make sure your shoes fit well. Choose footwear that is right for your sport. °· Protect your knees. Use the proper gear for knee-sensitive activities. Use kneepads when playing volleyball or laying carpet. Buckle your seat belt every time you drive. Most shattered kneecaps occur in car accidents. °·   Rest when you are tired. °SEEK MEDICAL CARE IF:  °You have knee pain that is continual and does not seem to be getting better.  °SEEK IMMEDIATE MEDICAL CARE IF:  °Your knee joint feels hot to the touch and you have a high fever. °MAKE SURE YOU:  °· Understand these instructions. °· Will watch your condition. °· Will get help right away if you are not doing well or get worse. °Document Released: 01/31/2007 Document Revised: 06/28/2011 Document Reviewed: 01/31/2007 °ExitCare® Patient Information ©2015 [employer]. This information is not intended to replace advice given to you by your health care provider. Make sure you discuss any questions you have with your health care provider. ° °

## 2015-03-18 ENCOUNTER — Emergency Department (HOSPITAL_COMMUNITY): Payer: Self-pay

## 2015-03-18 ENCOUNTER — Observation Stay (HOSPITAL_COMMUNITY)
Admission: EM | Admit: 2015-03-18 | Discharge: 2015-03-19 | Disposition: A | Discharge status: Home / Self Care | Payer: Self-pay | Attending: Family Medicine | Admitting: Family Medicine

## 2015-03-18 ENCOUNTER — Encounter (HOSPITAL_COMMUNITY): Payer: Self-pay | Admitting: Emergency Medicine

## 2015-03-18 ENCOUNTER — Observation Stay (HOSPITAL_COMMUNITY): Payer: Self-pay

## 2015-03-18 DIAGNOSIS — F17200 Nicotine dependence, unspecified, uncomplicated: Secondary | ICD-10-CM | POA: Insufficient documentation

## 2015-03-18 DIAGNOSIS — I5040 Unspecified combined systolic (congestive) and diastolic (congestive) heart failure: Secondary | ICD-10-CM | POA: Insufficient documentation

## 2015-03-18 DIAGNOSIS — M25571 Pain in right ankle and joints of right foot: Secondary | ICD-10-CM | POA: Insufficient documentation

## 2015-03-18 DIAGNOSIS — Z79899 Other long term (current) drug therapy: Secondary | ICD-10-CM | POA: Insufficient documentation

## 2015-03-18 DIAGNOSIS — E785 Hyperlipidemia, unspecified: Secondary | ICD-10-CM | POA: Insufficient documentation

## 2015-03-18 DIAGNOSIS — G459 Transient cerebral ischemic attack, unspecified: Principal | ICD-10-CM | POA: Insufficient documentation

## 2015-03-18 DIAGNOSIS — I10 Essential (primary) hypertension: Secondary | ICD-10-CM | POA: Insufficient documentation

## 2015-03-18 DIAGNOSIS — R9082 White matter disease, unspecified: Secondary | ICD-10-CM | POA: Insufficient documentation

## 2015-03-18 DIAGNOSIS — M6289 Other specified disorders of muscle: Secondary | ICD-10-CM

## 2015-03-18 DIAGNOSIS — R531 Weakness: Secondary | ICD-10-CM | POA: Diagnosis present

## 2015-03-18 DIAGNOSIS — Z8781 Personal history of (healed) traumatic fracture: Secondary | ICD-10-CM

## 2015-03-18 LAB — I-STAT CHEM 8, ED
BUN: 8 mg/dL (ref 6–20)
CREATININE: 0.8 mg/dL (ref 0.61–1.24)
Calcium, Ion: 1.04 mmol/L — ABNORMAL LOW (ref 1.12–1.23)
Chloride: 106 mmol/L (ref 101–111)
GLUCOSE: 93 mg/dL (ref 65–99)
HEMATOCRIT: 49 % (ref 39.0–52.0)
HEMOGLOBIN: 16.7 g/dL (ref 13.0–17.0)
POTASSIUM: 4 mmol/L (ref 3.5–5.1)
Sodium: 141 mmol/L (ref 135–145)
TCO2: 23 mmol/L (ref 0–100)

## 2015-03-18 LAB — CBC
HEMATOCRIT: 46.1 % (ref 39.0–52.0)
HEMOGLOBIN: 16.1 g/dL (ref 13.0–17.0)
MCH: 33.2 pg (ref 26.0–34.0)
MCHC: 34.9 g/dL (ref 30.0–36.0)
MCV: 95.1 fL (ref 78.0–100.0)
Platelets: 182 10*3/uL (ref 150–400)
RBC: 4.85 MIL/uL (ref 4.22–5.81)
RDW: 12.4 % (ref 11.5–15.5)
WBC: 8.2 10*3/uL (ref 4.0–10.5)

## 2015-03-18 LAB — PROTIME-INR
INR: 1.08 (ref 0.00–1.49)
Prothrombin Time: 14.2 seconds (ref 11.6–15.2)

## 2015-03-18 LAB — RAPID URINE DRUG SCREEN, HOSP PERFORMED
Amphetamines: NOT DETECTED
BARBITURATES: NOT DETECTED
Benzodiazepines: NOT DETECTED
COCAINE: NOT DETECTED
OPIATES: NOT DETECTED
Tetrahydrocannabinol: POSITIVE — AB

## 2015-03-18 LAB — DIFFERENTIAL
BASOS PCT: 0 %
Basophils Absolute: 0 10*3/uL (ref 0.0–0.1)
EOS ABS: 0.2 10*3/uL (ref 0.0–0.7)
EOS PCT: 3 %
LYMPHS ABS: 2.6 10*3/uL (ref 0.7–4.0)
Lymphocytes Relative: 32 %
MONO ABS: 0.8 10*3/uL (ref 0.1–1.0)
MONOS PCT: 9 %
Neutro Abs: 4.6 10*3/uL (ref 1.7–7.7)
Neutrophils Relative %: 56 %

## 2015-03-18 LAB — COMPREHENSIVE METABOLIC PANEL
ALT: 19 U/L (ref 17–63)
ANION GAP: 5 (ref 5–15)
AST: 13 U/L — ABNORMAL LOW (ref 15–41)
Albumin: 3.7 g/dL (ref 3.5–5.0)
Alkaline Phosphatase: 103 U/L (ref 38–126)
BILIRUBIN TOTAL: 0.6 mg/dL (ref 0.3–1.2)
BUN: 7 mg/dL (ref 6–20)
CO2: 24 mmol/L (ref 22–32)
Calcium: 8.6 mg/dL — ABNORMAL LOW (ref 8.9–10.3)
Chloride: 110 mmol/L (ref 101–111)
Creatinine, Ser: 0.83 mg/dL (ref 0.61–1.24)
Glucose, Bld: 94 mg/dL (ref 65–99)
POTASSIUM: 4.2 mmol/L (ref 3.5–5.1)
Sodium: 139 mmol/L (ref 135–145)
TOTAL PROTEIN: 6.1 g/dL — AB (ref 6.5–8.1)

## 2015-03-18 LAB — APTT: aPTT: 33 seconds (ref 24–37)

## 2015-03-18 LAB — URINALYSIS, ROUTINE W REFLEX MICROSCOPIC
Bilirubin Urine: NEGATIVE
Glucose, UA: NEGATIVE mg/dL
Hgb urine dipstick: NEGATIVE
KETONES UR: NEGATIVE mg/dL
LEUKOCYTES UA: NEGATIVE
Nitrite: NEGATIVE
PROTEIN: NEGATIVE mg/dL
Specific Gravity, Urine: 1.01 (ref 1.005–1.030)
pH: 7.5 (ref 5.0–8.0)

## 2015-03-18 LAB — ETHANOL

## 2015-03-18 LAB — I-STAT TROPONIN, ED: TROPONIN I, POC: 0.01 ng/mL (ref 0.00–0.08)

## 2015-03-18 MED ORDER — ACETAMINOPHEN 325 MG PO TABS
325.0000 mg | ORAL_TABLET | Freq: Once | ORAL | Status: DC
  Filled 2015-03-18: qty 1

## 2015-03-18 MED ORDER — ASPIRIN 81 MG PO CHEW
324.0000 mg | CHEWABLE_TABLET | Freq: Once | ORAL | Status: AC
  Administered 2015-03-18: 324 mg via ORAL
  Filled 2015-03-18: qty 4

## 2015-03-18 MED ORDER — NICOTINE 21 MG/24HR TD PT24
21.0000 mg | MEDICATED_PATCH | Freq: Once | TRANSDERMAL | Status: AC
  Administered 2015-03-18: 21 mg via TRANSDERMAL
  Filled 2015-03-18: qty 1

## 2015-03-18 MED ORDER — ACETAMINOPHEN 325 MG PO TABS
650.0000 mg | ORAL_TABLET | Freq: Once | ORAL | Status: AC
  Administered 2015-03-18: 650 mg via ORAL

## 2015-03-18 MED ORDER — GABAPENTIN 300 MG PO CAPS
300.0000 mg | ORAL_CAPSULE | Freq: Three times a day (TID) | ORAL | Status: DC | PRN
  Administered 2015-03-19: 300 mg via ORAL
  Filled 2015-03-18: qty 1

## 2015-03-18 MED ORDER — NICOTINE 21 MG/24HR TD PT24: 21.0000 mg | MEDICATED_PATCH | Freq: Once | TRANSDERMAL | Status: DC

## 2015-03-18 MED ORDER — STROKE: EARLY STAGES OF RECOVERY BOOK
Freq: Once | Status: AC
  Administered 2015-03-18: 17:00:00
  Filled 2015-03-18: qty 1

## 2015-03-18 MED ORDER — ACETAMINOPHEN 325 MG PO TABS
650.0000 mg | ORAL_TABLET | Freq: Four times a day (QID) | ORAL | Status: DC | PRN
  Administered 2015-03-18: 650 mg via ORAL
  Filled 2015-03-18: qty 2

## 2015-03-18 MED ORDER — ASPIRIN EC 81 MG PO TBEC
81.0000 mg | DELAYED_RELEASE_TABLET | Freq: Every day | ORAL | Status: DC
  Administered 2015-03-19: 81 mg via ORAL
  Filled 2015-03-18: qty 1

## 2015-03-18 MED ORDER — ENOXAPARIN SODIUM 40 MG/0.4ML ~~LOC~~ SOLN
40.0000 mg | SUBCUTANEOUS | Status: DC
  Administered 2015-03-18: 40 mg via SUBCUTANEOUS
  Filled 2015-03-18: qty 0.4

## 2015-03-18 NOTE — ED Notes (Signed)
Patient still out for scan.

## 2015-03-18 NOTE — H&P (Signed)
Family Medicine Teaching [facility] Admission History and Physical Service Pager: [phone]  Patient name: Aaron Kennedy Medical record number: [id number] Date of birth: [birth year] Age: 44 y.o. Gender: male  Primary Care Provider: Pcp Not In System Consultants: none  Code Status: FULL  Chief Complaint: right sided numbness/weakness, slurred speech, and difficulty with balance .    Assessment and Plan: Aaron Kennedy is a 44 y.o. male presenting with right sided numbness/weakness, slurred speech, and difficulty with balance, suggestive of [name]. PMH is significant for HTN (noncompliant with meds), Chronic Pain (right ankle), ?HSV.   Suspected [name] with Right Sided Numbness/Weakness, slurred speech, and difficulty with balance: Now significantly improved with only some residual numbness over right lateral ankle (chronic from h/o fracture) and right fingertips. Risk factors include uncontrolled HTN without compliance to medication (due to reported dizziness), tobacco use. CT Head negative for acute findings. MRI head only showed moderately advanced cerebral white matter disease for age which may reflect chronic small vessel ischemia.  - admit to observation, telemetry, attending Dr. Randolm Idol - Neuro checks per protocol  - ASA  in ED; will start 81 mg daily 11/30, recommend he continue ASA 81 daily on discharge - allow permissive HTN for 24 hrs - ECHO and Carotid US ordered  - risk stratification labs: A1c, lipid, TSH - PT/OT/Speech consulted   Right Ankle Pain: Notes of history of injury about 1 year ago, but was not evaluated. No recent re-injury. Symptoms started along with [name] symptoms. Normal ROM with only some tenderness on lateral malleolus.  - Ordered x-ray right ankle complete - continue home Gabapentin  PRN - Tylenol PRN  Tobacco Use: almost 1ppd ~43 years.  - Nicotine patch .  HTN: 177/114 in ED which improved without treatment. Patient self discontinued home  Amlodipine due to dizziness. Most recent BP 162/87 - allow permissive HTN for 24 hrs - will follow BP - anticipate starting anti-HTN med on discharge, likely ACE vs Thiazide   FEN/GI: heart diet Prophylaxis: Lovenox SQ  Disposition: admit to teaching service for management of [name].   History of Present Illness:  Aaron Kennedy is a 44 y.o. male presenting with right sided numbness/weakness, slurred speech, and difficulty with balance .   Patient reports that he first noticed symptoms after waking up early morning 11/29 at 0330, said he "felt like right arm numbness", thought he "slept on it overnight" and got up to go smoke, but went back to sleep. He then woke up around 0700 and then realized his right arm, hand, leg, and foot were "numb", also described feeling some "numbness in tip of R-ear". He describes also feeling dizzy, difficulty with balance was "shuffling" with his gait and did not "feel right".  Never had similar episode of numbness before. No history of prior [name] / CVA. 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 He did have some subsequent seizures later and was put on Dilantin for 2 years. Last seizure was 2006. Has not had one since, and has been off Dilantin since that time.  Currently symptoms mostly resolved with only very mild residual numbness in right foot and fingertips. Does endorse some residual problem with prior broken Right ankle. Previously was prescribed Gabapentin, still takes occasional  in AM only PRN.  Does have a history of elevated BP. Was started on Amlodipine because was told had  BP up to 215/115, did not tolerate and felt dizzy after trying to take this, then self discontinued. He does check BP at home, has been checking it regularly at home, with reported values of about 120-140s/80-90s.  Admits "neck pain". Denies  any fevers/chills, headaches, vision loss  Active smoker, about 0.5ppd since age [age], about >25 year smoking history. Infrequent alcohol use, none in past few months. Admits to marijuana use. No other illicit drug use.  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  Regarding current home medications, patient has been out of Acyclovir and Elavil. Does not take Percoet. History of hernia surgery, has had some history of cellulitis and MRSA.  Review Of Systems: Per HPI Otherwise the remainder of the systems were negative.  Patient Active Problem List   Diagnosis Date Noted  . Right sided weakness 03/18/2015    Past Medical History: Past Medical History  Diagnosis Date  . Hypertension     Past Surgical History: Past Surgical History  Procedure Laterality Date  . Hernia repair    . Leg surgery      Social History: Social History  Substance Use Topics  . Smoking status: Current Every Day Smoker  . Smokeless tobacco: None  . Alcohol Use: Yes   Please also refer to relevant sections of EMR.  Family History: No family history on file.   Allergies and Medications: No Known Allergies No current facility-administered medications on file prior to encounter.   Current Outpatient Prescriptions on File Prior to Encounter  Medication Sig Dispense Refill  . gabapentin (NEURONTIN) 600 MG tablet Take 600-1,200 mg by mouth daily as needed (leg pain).     Marland Kitchen acyclovir (ZOVIRAX) 200 MG capsule Take 1 capsule (200 mg total) by mouth every morning. (Patient not taking: Reported on 03/18/2015) 90 capsule 3  . amitriptyline (ELAVIL) 50 MG tablet TAKE THREE TABLETS BY MOUTH AT BEDTIME. "OV NEEDED FOR ADDITIONAL REFILLS" (Patient not taking: Reported on 03/18/2015) 90 tablet 0  . amLODipine (NORVASC) 10 MG tablet Take 1 tablet (10 mg total) by mouth daily. (Patient not taking: Reported on 03/18/2015) 90 tablet 3  . oxyCODONE-acetaminophen  (PERCOCET/ROXICET) 5-325 MG per tablet Take 1 tablet by mouth every 12 (twelve) hours as needed. (Patient not taking: Reported on 03/18/2015) 60 tablet 0    Objective: BP 146/109 mmHg  Pulse 63  Temp(Src) 97.9 F (36.6 C) (Oral)  Resp 17  SpO2 96% Exam: General: NAD, resting comfortably  Eyes: EOMI, PERRL ENTM: MMM, uvula at midline, oropharynx normal Neck: no carotid bruits heard  Cardiovascular: RRR. No m/r/g/ Respiratory: CTAB, normal effort Abdomen: Soft, NT, ND, + bowel sounds MSK: No LE edema or calf tenderness; notes of tenderness to palpation over the right lateral malleolus, no edema noted, very mild erythema over this area, no increased warmth. No obvious deformity Skin: warm and dry; no rashes on exposed areas Neuro: CN 3-12 intact (visual fields not tested), Strength: 5/5 in upper and lower extremities bilaterally. Sensation: numbness over right lateral ankle, otherwise normal sensation. Finger to nose testing normal bilaterally; normal gait Psych: Mood and affect euthymic, normal rate and volume of speech  Labs and Imaging: CBC BMET   Recent Labs Lab 03/18/15 1014 03/18/15 1022  WBC 8.2  --   HGB 16.1 16.7  HCT 46.1 49.0  PLT 182  --     Recent Labs Lab 03/18/15 1014 03/18/15 1022  NA 139 141  K 4.2 4.0  CL 110 106  CO2 24  --   BUN 7 8  CREATININE 0.83 0.80  GLUCOSE 94 93  CALCIUM 8.6*  --      11/29 Head CT w/o contrast IMPRESSION: No acute intracranial abnormality. No definite acute cortical infarction. Mild mucosal thickening with partial opacification right sphenoid sinus.  11/29 MR Brain w/o contrast IMPRESSION: 1. No acute intracranial abnormality. 2. Moderately advanced cerebral white matter disease for age, nonspecific. This may reflect chronic small vessel ischemia in this patient with hypertension. Other considerations include the sequelae of trauma, hypercoagulable state, vasculitis, migraines, prior infection and  demyelination.  Palma Holter, MD 03/18/2015, 3:35 PM PGY-1, [facility] FPTS Intern pager: [phone], text pages welcome  Upper Level Addendum:  I have seen and evaluated this patient along with Dr. Ottie Glazier and reviewed the above note, making necessary revisions in purple.  Saralyn Pilar, DO [facility], PGY-3

## 2015-03-18 NOTE — ED Notes (Signed)
Admitting MD at the bedside.  

## 2015-03-18 NOTE — ED Notes (Signed)
Patient comes in today with complaints of numbness to right-side. Patient states when he went to bed at midnight he was fine and when he woke up at 0300 he started feeling numbness and pins. Patient denies any pain. neuro assessment showed slight facial droop and right weakness to the right lower extremities. Family states patient's speech is slurred.  Patient alert and oriented x4.

## 2015-03-18 NOTE — ED Provider Notes (Signed)
CSN: [id number]     Arrival date & time 03/18/15  0930 History   First MD Initiated Contact with Patient 03/18/15 [phone]     Chief Complaint  Patient presents with  . Weakness     (Consider location/radiation/quality/duration/timing/severity/associated sxs/prior Treatment) HPI Comments: Patient states numbness and weakness to his right side. He was normal when he went to bed around 12:30. He woke up around 3 with numbness to his right face, arm and leg. Denies pain. Family reports his speech is slurred. History of hypertension has not had medications in 1 year. No headache, chest pain, shortness of breath. Numbness and weakness have not changed since it first started. He denies any visual changes. He denies any chest pain or shortness of breath. He denies any bowel or bladder incontinence. Denies any difficulty swallowing. His family thinks his speech is slurred.  Patient is a 44 y.o. male presenting with weakness. The history is provided by the patient.  Weakness Pertinent negatives include no chest pain, no abdominal pain, no headaches and no shortness of breath.    Past Medical History  Diagnosis Date  . Hypertension    Past Surgical History  Procedure Laterality Date  . Hernia repair    . Leg surgery     No family history on file. Social History  Substance Use Topics  . Smoking status: Current Every Day Smoker  . Smokeless tobacco: None  . Alcohol Use: Yes    Review of Systems  Constitutional: Negative for fever, activity change and appetite change.  HENT: Negative for congestion.   Eyes: Negative for visual disturbance.  Respiratory: Negative for cough, chest tightness and shortness of breath.   Cardiovascular: Negative for chest pain.  Gastrointestinal: Negative for nausea, vomiting and abdominal pain.  Genitourinary: Negative for dysuria and hematuria.  Musculoskeletal: Negative for myalgias and arthralgias.  Skin: Negative for rash.  Neurological: Positive for  facial asymmetry, weakness, light-headedness and numbness. Negative for dizziness and headaches.  A complete 10 system review of systems was obtained and all systems are negative except as noted in the HPI and PMH.      Allergies  Review of patient's allergies indicates no known allergies.  Home Medications   Prior to Admission medications   Medication Sig Start Date End Date Taking? Authorizing Provider  Esomeprazole Magnesium (NEXIUM PO) Take 1 capsule by mouth daily as needed (heartburn).   Yes Historical Provider, MD  gabapentin (NEURONTIN) 600 MG tablet Take 600-1,200 mg by mouth daily as needed (leg pain).    Yes Historical Provider, MD  hydroxypropyl methylcellulose / hypromellose (ISOPTO TEARS / GONIOVISC) 2.5 % ophthalmic solution Place 1 drop into both eyes 4 (four) times daily as needed for dry eyes.   Yes Historical Provider, MD  acyclovir (ZOVIRAX) 200 MG capsule Take 1 capsule (200 mg total) by mouth every morning. Patient not taking: Reported on 03/18/2015 05/24/13   [name], MD  amitriptyline (ELAVIL) 50 MG tablet TAKE THREE TABLETS BY MOUTH AT BEDTIME. "OV NEEDED FOR ADDITIONAL REFILLS" Patient not taking: Reported on 03/18/2015 05/27/14   Chelle Jeffery, PA-C  amLODipine (NORVASC) 10 MG tablet Take 1 tablet (10 mg total) by mouth daily. Patient not taking: Reported on 03/18/2015 05/24/13   [name], MD  oxyCODONE-acetaminophen (PERCOCET/ROXICET) 5-325 MG per tablet Take 1 tablet by mouth every 12 (twelve) hours as needed. Patient not taking: Reported on 03/18/2015 05/24/13   [name], MD   BP 158/94 mmHg  Pulse 67  Temp(Src) 97.9 F (36.6  C) (Oral)  Resp 18  SpO2 95% Physical Exam  Constitutional: He is oriented to person, place, and time. He appears well-developed and well-nourished. No distress.  HENT:  Head: Normocephalic and atraumatic.  Mouth/Throat: Oropharynx is clear and moist. No oropharyngeal exudate.  Eyes: Conjunctivae and EOM are  normal. Pupils are equal, round, and reactive to light.  Neck: Normal range of motion. Neck supple.  No meningismus.  Cardiovascular: Normal rate, regular rhythm, normal heart sounds and intact distal pulses.   No murmur heard. Pulmonary/Chest: Effort normal and breath sounds normal. No respiratory distress.  Abdominal: Soft. There is no tenderness. There is no rebound and no guarding.  Musculoskeletal: Normal range of motion. He exhibits no edema or tenderness.  Neurological: He is alert and oriented to person, place, and time. A cranial nerve deficit is present. He exhibits normal muscle tone. Coordination normal.  Slight right-sided facial droop, questionable slurred speech, tongue midline. 4/5 strength of right upper extremity and right lower extremity. Pronator drift of right arm. No ataxia on finger to nose. Subjective sensory deficit on the right side compared to left.  Skin: Skin is warm.  Psychiatric: He has a normal mood and affect. His behavior is normal.  Nursing note and vitals reviewed.   ED Course  Procedures (including critical care time) Labs Review Labs Reviewed  COMPREHENSIVE METABOLIC PANEL - Abnormal; Notable for the following:    Calcium 8.6 (*)    Total Protein 6.1 (*)    AST 13 (*)    All other components within normal limits  URINE RAPID DRUG SCREEN, HOSP PERFORMED - Abnormal; Notable for the following:    Tetrahydrocannabinol POSITIVE (*)    All other components within normal limits  I-STAT CHEM 8, ED - Abnormal; Notable for the following:    Calcium, Ion 1.04 (*)    All other components within normal limits  ETHANOL  PROTIME-INR  APTT  CBC  DIFFERENTIAL  URINALYSIS, ROUTINE W REFLEX MICROSCOPIC (NOT AT [facility])  I-STAT TROPOININ, ED  CBG MONITORING, ED    Imaging Review Ct Head Wo Contrast  03/18/2015  CLINICAL DATA:  Right side weakness, facial droop, slurred speech EXAM: CT HEAD WITHOUT CONTRAST TECHNIQUE: Contiguous axial images were obtained  from the base of the skull through the vertex without intravenous contrast. COMPARISON:  09/02/2010 FINDINGS: There is mild mucosal thickening right sphenoid sinus. No intracranial hemorrhage, mass effect or midline shift. No definite acute cortical infarction. No mass lesion is noted on this unenhanced scan. No hydrocephalus. No intra or extra-axial fluid collection. Ventricular size is stable from prior exam. IMPRESSION: No acute intracranial abnormality. No definite acute cortical infarction. Mild mucosal thickening with partial opacification right sphenoid sinus. Electronically Signed   By: Natasha Mead M.D.   On: 03/18/2015 13:15   Mr Brain Wo Contrast  03/18/2015  CLINICAL DATA:  Right-sided weakness.  Right facial droop. EXAM: MRI HEAD WITHOUT CONTRAST TECHNIQUE: Multiplanar, multiecho pulse sequences of the brain and surrounding structures were obtained without intravenous contrast. COMPARISON:  Head CT 03/18/2015 FINDINGS: No acute infarct is identified. Increased signal along the anterior left aspect of the pons and medulla on axial diffusion weighted images is felt to be artifact. There is no evidence of intracranial hemorrhage, mass, midline shift, or extra-axial fluid collection. Small foci of T2 hyperintensity are scattered throughout the subcortical and deep cerebral white matter bilaterally, moderately advanced for age. Mildly dilated perivascular spaces are noted about the basal ganglia, most notably in the region of the  posterior limb of the right internal capsule. Orbits are unremarkable. Scattered mucous retention cysts and mild mucosal thickening are noted in the paranasal sinuses. Secretions are noted in the right sphenoid sinus. The mastoid air cells are clear. There is mild enlargement of the cisterna magna. Major intracranial vascular flow voids are preserved. IMPRESSION: 1. No acute intracranial abnormality. 2. Moderately advanced cerebral white matter disease for age, nonspecific. This  may reflect chronic small vessel ischemia in this patient with hypertension. Other considerations include the sequelae of trauma, hypercoagulable state, vasculitis, migraines, prior infection and demyelination. Electronically Signed   By: Sebastian Ache M.D.   On: 03/18/2015 14:56   I have personally reviewed and evaluated these images and lab results as part of my medical decision-making.   EKG Interpretation   Date/Time:  Tuesday March 18 2015 09:57:53 EST Ventricular Rate:  68 PR Interval:  166 QRS Duration: 84 QT Interval:  427 QTC Calculation: 454 R Axis:   33 Text Interpretation:  Sinus rhythm Abnormal R-wave progression, early  transition ST elevation, consider inferior injury No previous ECGs  available Confirmed by Nevia Henkin  MD, Sagrario Lineberry [phone]) on 03/18/2015 2:23:38  PM      MDM   Final diagnoses:  Right sided weakness   Right-sided numbness and weakness, last seen normal 3 AM. Concern for stroke. Code stroke not activated due to delay in presentation.  CT head is negative for hemorrhage or obvious infarct. Labs remarkable for marijuana and drug screen. Blood pressure is uncontrolled. States he has not had blood pressure medication for more than 1 year.  Concern for CVA versus [name]. His symptoms are gradually improving. Unassigned admission will be discussed with family practice residents. MRI pending.    Glynn Octave, MD 03/18/15 [phone]

## 2015-03-18 NOTE — ED Notes (Signed)
Admitting MD continues to be at the bedside

## 2015-03-18 NOTE — ED Notes (Signed)
Admitting MD just stepped out.

## 2015-03-18 NOTE — Progress Notes (Signed)
Patient arrived from ED to 5C16. Safety precautions and orders reviewed with patient. Call light within reach at this time. TELE called and applied. Will continue to monitor.   Sim Boast, RN

## 2015-03-18 NOTE — ED Notes (Signed)
Pt being transported upstairs by Zella Ball, EMT

## 2015-03-19 ENCOUNTER — Observation Stay (HOSPITAL_BASED_OUTPATIENT_CLINIC_OR_DEPARTMENT_OTHER): Payer: Self-pay

## 2015-03-19 DIAGNOSIS — I10 Essential (primary) hypertension: Secondary | ICD-10-CM

## 2015-03-19 DIAGNOSIS — I639 Cerebral infarction, unspecified: Secondary | ICD-10-CM

## 2015-03-19 DIAGNOSIS — G459 Transient cerebral ischemic attack, unspecified: Secondary | ICD-10-CM

## 2015-03-19 LAB — BASIC METABOLIC PANEL
Anion gap: 7 (ref 5–15)
BUN: 7 mg/dL (ref 6–20)
CHLORIDE: 108 mmol/L (ref 101–111)
CO2: 25 mmol/L (ref 22–32)
CREATININE: 0.98 mg/dL (ref 0.61–1.24)
Calcium: 8.6 mg/dL — ABNORMAL LOW (ref 8.9–10.3)
GFR calc Af Amer: >60 mL/min (ref >60)
GFR calc non Af Amer: >60 mL/min (ref >60)
GLUCOSE: 86 mg/dL (ref 65–99)
Potassium: 4.1 mmol/L (ref 3.5–5.1)
SODIUM: 140 mmol/L (ref 135–145)

## 2015-03-19 LAB — LIPID PANEL
Cholesterol: 189 mg/dL (ref 0–200)
HDL: 24 mg/dL — ABNORMAL LOW (ref >40)
LDL CALC: 140 mg/dL — AB (ref 0–99)
Total CHOL/HDL Ratio: 7.9 RATIO
Triglycerides: 127 mg/dL (ref <150)
VLDL: 25 mg/dL (ref 0–40)

## 2015-03-19 LAB — CBC
HCT: 46.6 % (ref 39.0–52.0)
HEMOGLOBIN: 16.1 g/dL (ref 13.0–17.0)
MCH: 33.1 pg (ref 26.0–34.0)
MCHC: 34.5 g/dL (ref 30.0–36.0)
MCV: 95.7 fL (ref 78.0–100.0)
Platelets: 165 10*3/uL (ref 150–400)
RBC: 4.87 MIL/uL (ref 4.22–5.81)
RDW: 12.5 % (ref 11.5–15.5)
WBC: 7.8 10*3/uL (ref 4.0–10.5)

## 2015-03-19 LAB — GLUCOSE, CAPILLARY: GLUCOSE-CAPILLARY: 95 mg/dL (ref 65–99)

## 2015-03-19 LAB — TSH: TSH: 1.362 u[IU]/mL (ref 0.350–4.500)

## 2015-03-19 MED ORDER — ATORVASTATIN CALCIUM 40 MG PO TABS: 40.0000 mg | ORAL_TABLET | Freq: Every day | ORAL | Status: DC

## 2015-03-19 MED ORDER — NICOTINE POLACRILEX 2 MG MT GUM
2.0000 mg | CHEWING_GUM | OROMUCOSAL | Status: DC | PRN
  Filled 2015-03-19: qty 1

## 2015-03-19 MED ORDER — METOPROLOL SUCCINATE ER 25 MG PO TB24: 25.0000 mg | ORAL_TABLET | Freq: Every day | ORAL | Status: DC

## 2015-03-19 MED ORDER — METOPROLOL SUCCINATE ER 25 MG PO TB24
25.0000 mg | ORAL_TABLET | Freq: Every day | ORAL | Status: DC
  Administered 2015-03-19: 25 mg via ORAL
  Filled 2015-03-19: qty 1

## 2015-03-19 MED ORDER — ATORVASTATIN CALCIUM 40 MG PO TABS
40.0000 mg | ORAL_TABLET | Freq: Every day | ORAL | Status: DC
Start: 2015-03-19 — End: 2015-03-19

## 2015-03-19 MED ORDER — AMITRIPTYLINE HCL 50 MG PO TABS
ORAL_TABLET | ORAL | Status: DC
Start: 2015-03-19 — End: 2015-04-22

## 2015-03-19 MED ORDER — NICOTINE 21 MG/24HR TD PT24: 21.0000 mg | MEDICATED_PATCH | Freq: Once | TRANSDERMAL | Status: DC

## 2015-03-19 MED ORDER — ENOXAPARIN SODIUM 60 MG/0.6ML ~~LOC~~ SOLN
50.0000 mg | SUBCUTANEOUS | Status: DC
  Filled 2015-03-19: qty 0.6

## 2015-03-19 MED ORDER — NICOTINE POLACRILEX 2 MG MT GUM: 2.0000 mg | CHEWING_GUM | OROMUCOSAL | Status: DC | PRN

## 2015-03-19 MED ORDER — ASPIRIN 81 MG PO TBEC: 81.0000 mg | DELAYED_RELEASE_TABLET | Freq: Every day | ORAL | Status: DC

## 2015-03-19 MED ORDER — LISINOPRIL 10 MG PO TABS: 10.0000 mg | ORAL_TABLET | Freq: Every day | ORAL | Status: DC

## 2015-03-19 MED ORDER — LISINOPRIL 10 MG PO TABS
10.0000 mg | ORAL_TABLET | Freq: Every day | ORAL | Status: DC
  Administered 2015-03-19: 10 mg via ORAL
  Filled 2015-03-19: qty 1

## 2015-03-19 NOTE — Progress Notes (Signed)
Discharge instructions discussed and reviewed with Pt and Pt's sister. Pt was informed of upcoming appointments and educated on new medication, hypertension, stroke risk factors and prevention. Pt was provided handouts to take home of information discussed and verbalized understanding. Pt had a NIH of 1. MD notified of Pt's BP 172/108 and pule 62 prior to discharge. RAC IV removed with no difficulties. Pt v/u of all instructions and stated he had no further questions.

## 2015-03-19 NOTE — Progress Notes (Signed)
Family Medicine Teaching Service Daily Progress Note Intern Pager: [phone]  Patient name: Aaron Kennedy Medical record number: [id number] Date of birth: 1970-05-31 Age: 44 y.o. Gender: male  Primary Care Provider: Pcp Not In System Consultants: none Code Status: FULL  Pt Overview and Major Events to Date:  11/29: admitted for suspected [name]  Assessment and Plan: CHRISTAPHER GILLIAN is a 44 y.o. male presenting with right sided numbness/weakness, slurred speech, and difficulty with balance, suggestive of [name]. PMH is significant for HTN (noncompliant with meds), Chronic Pain (right ankle), ?HSV.   Suspected [name] with Right Sided Numbness/Weakness, slurred speech, and difficulty with balance, Resolved: Now significantly improved with only some residual numbness over right lateral ankle (chronic from h/o fracture) - ASA  in ED; start 81 mg daily 11/30, recommend he continue ASA 81 daily on discharge - ECHO showed EF 45-50%, G1DD - Carotid US: negative  - risk stratification labs: A1c pending - TSH wnl - Lipids values give patient ASCVD 10 yr risk score of 15.3% > started Atorvastatin  (rec moderate to high intensity statin)  - PT/OT/Speech consulted: no needs currently   Newly Diagnosed Combined HF, Stable: ECHO showed  EF 45-50%, G1DD.  - started on Metoprolol XL  daily - started on Lisinopril  daily  - ASA daily - started Atorvastatin - cardiology referral as outpatient   Right Ankle Pain, Improved: Notes of history of injury about 1 year ago, but was not evaluated. No recent re-injury. Symptoms started the morning of admission. Normal ROM with only some tenderness on lateral malleolus;no edema.  - X-ray: corticated ossific fragments adjacent to the medial malleolus likely reflecting sequela of prior avulsive injury, but no acute injury noted - continue home Gabapentin  PRN - Tylenol PRN  HTN: 177/114 in ED which improved without treatment. Patient self  discontinued home Amlodipine due to dizziness. Most recent BP 145-165/92-97 - starting Lisinopril  daily  - BMET ordered for future as an outpatient; lab appointment made 12/7  Tobacco Use: almost 1ppd ~43 years.  - Nicotine patch . - Nicorete gum   FEN/GI: heart diet Prophylaxis: Lovenox SQ  Disposition: home today   Subjective:  - states symptoms are almost completely resolved. Only has some numbness around right ankle.  - ready to go home   Objective: Temp:  [97.6 F (36.4 C)-98.4 F (36.9 C)] 97.6 F (36.4 C) (11/30 0600) Pulse Rate:  [56-73] 61 (11/30 0600) Resp:  [14-23] 20 (11/30 0600) BP: (134-177)/(87-114) 153/93 mmHg (11/30 0600) SpO2:  [93 %-97 %] 97 % (11/30 0600) Physical Exam: General: NAD, resting comfortably  Eyes: EOMI, PERRL ENTM: MMM, uvula at midline, oropharynx normal Neck: no carotid bruits heard  Cardiovascular: RRR. No m/r/g/ Respiratory: CTAB, normal effort Abdomen: Soft, NT, ND, + bowel sounds MSK: No LE edema or calf tenderness; notes of mild tenderness to palpation over the right lateral malleolus, no edema noted,no increased warmth. No obvious deformity Skin: warm and dry; no rashes on exposed areas Neuro: CN 3-12 intact (visual fields not tested), Strength: 5/5 in upper and lower extremities bilaterally. Sensation: numbness over right lateral ankle (improved from yesterday), otherwise normal sensation. Finger to nose testing normal bilaterally; normal gait Psych: Mood and affect euthymic, normal rate and volume of speech   Laboratory:  Recent Labs Lab 03/18/15 1014 03/18/15 1022 03/19/15 0546  WBC 8.2  --  7.8  HGB 16.1 16.7 16.1  HCT 46.1 49.0 46.6  PLT 182  --  165    Recent  Labs Lab 03/18/15 1014 03/18/15 1022 03/19/15 0546  NA 139 141 140  K 4.2 4.0 4.1  CL 110 106 108  CO2 24  --  25  BUN CREATININE 0.83 0.80 0.98  CALCIUM 8.6*  --  8.6*  PROT 6.1*  --   --   BILITOT 0.6  --   --   ALKPHOS 103  --    --   ALT 19  --   --   AST 13*  --   --   GLUCOSE 94 93 86    Results for Kilcrease, Pastor R (MRN [id number]) as of 03/19/2015 06:58  Ref. Range 03/19/2015 05:46  Cholesterol Latest Ref Range: 0-200 mg/dL 409  Triglycerides Latest Ref Range: <150 mg/dL 811  HDL Cholesterol Latest Ref Range: >40 mg/dL 24 (L)  LDL (calc) Latest Ref Range: 0-99 mg/dL 914 (H)  VLDL Latest Ref Range: 0-40 mg/dL 25  Total CHOL/HDL Ratio Latest Units: RATIO 7.9   Imaging:   X-Ray Right Ankle: FINDINGS: There is no evidence of fracture, dislocation, or joint effusion. There are multiple well corticated ossific fragments adjacent to the medial malleolus likely reflecting sequela of prior avulsive injury. There is a anterior tibial marginal osteophyte. 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 Soft tissues are unremarkable.  IMPRESSION: No acute osseous injury of the right ankle.  CXR:  FINDINGS: The heart size and mediastinal contours are within normal limits. Both lungs are clear. The visualized skeletal structures are unremarkable.  IMPRESSION: No active cardiopulmonary disease   Palma Holter, MD 03/19/2015, 9:17 AM PGY-1,  Family Medicine FPTS Intern pager: [phone], text pages welcome

## 2015-03-19 NOTE — Evaluation (Signed)
Physical Therapy Evaluation Patient Details Name: Aaron Kennedy MRN: [id number] DOB: Aug 23, 1970 Today's Date: 03/19/2015   History of Present Illness  44 y/o male with PMH of tobacco abuse and uncontrolled HTN admitted with 12 hour history of slurred speech/difficulty with balance/right hand/leg numbness. MRI negative for acute CVA (moderately advanced white matter changes present). Symptoms resolving since time of initial evaluation in the ED.  Clinical Impression  Patient presents with slight weakness R LE as compared to L but without functional deficit beyond his baseline per pt report.  Feel safe for return home with intermittent assist for safety especially accessing second floor apartment more due to impulsivity (which also seems to be his baseline.)  No further skilled PT needs at this time.    Follow Up Recommendations No PT follow up    Equipment Recommendations  None recommended by PT    Recommendations for Other Services       Precautions / Restrictions Precautions Precautions: None      Mobility  Bed Mobility               General bed mobility comments: up in recliner  Transfers Overall transfer level: Independent                  Ambulation/Gait Ambulation/Gait assistance: Independent Ambulation Distance (Feet): 125 Feet Assistive device: None Gait Pattern/deviations: Antalgic;Decreased stance time - right;Wide base of support     General Gait Details: increased lateral weight shift and "dragging" R LE behind at times (reports h/o ankle injury and due to wearing soccer sandles and feet sliding out.  Encouraged pt to wear more secure shoes at home  Stairs Stairs: Yes Stairs assistance: Supervision Stair Management: Alternating pattern;Forwards;One rail Right;Two rails Number of Stairs: 10 General stair comments: no rail initially and LOB tripping on step with self recovery, and encouraged to use rails for safety used one for rest of ascent  and two for descent w/o further LOB  Wheelchair Mobility    Modified Rankin (Stroke Patients Only) Modified Rankin (Stroke Patients Only) Pre-Morbid Rankin Score: No symptoms Modified Rankin: Slight disability     Balance             Standing balance-Leahy Scale: Good         Tandem Stance - Left Leg: 30   Rhomberg - Eyes Closed: 30                 Pertinent Vitals/Pain Pain Assessment: Faces Pain Score: 3  Pain Location: R ankle Pain Descriptors / Indicators: Aching Pain Intervention(s): Monitored during session    Home Living Family/patient expects to be discharged to:: Private residence Living Arrangements: Spouse/significant other Available Help at Discharge: Friend(s) Type of Home: Apartment Home Access: Stairs to enter Entrance Stairs-Rails: Doctor, general practice of Steps: flight Home Layout: One level Home Equipment: None      Prior Function Level of Independence: Independent         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   Dominant Hand: Right    Extremity/Trunk Assessment   Upper Extremity Assessment: Overall WFL for tasks assessed           Lower Extremity Assessment: RLE deficits/detail RLE Deficits / Details: AROM WFL, strength hip flexion 4=/5, knee extension 4+/5, ankle DF 4/5       Communication   Communication: No difficulties  Cognition Arousal/Alertness: Awake/alert Behavior During Therapy: WFL for tasks assessed/performed;Impulsive (eager for d/c) Overall Cognitive Status: Within Functional  Limits for tasks assessed                      General Comments General comments (skin integrity, edema, etc.): Encouraged to gradually return to activities and to seek medical help soon if any symptoms recur    Exercises        Assessment/Plan    PT Assessment Patent does not need any further PT services  PT Diagnosis Abnormality of gait   PT Problem List    PT Treatment Interventions      PT Goals (Current goals can be found in the Care Plan section) Acute Rehab PT Goals PT Goal Formulation: All assessment and education complete, DC therapy    Frequency     Barriers to discharge        Co-evaluation               End of Session   Activity Tolerance: Patient tolerated treatment well Patient left: with call bell/phone within reach;in chair;with nursing/sitter in room      Functional Assessment Tool Used: Clinical Judgement Functional Limitation: Self care Self Care Current Status (Z6109): At least 1 percent but less than 20 percent impaired, limited or restricted Self Care Goal Status (U0454): 0 percent impaired, limited or restricted Self Care Discharge Status [phone]): 0 percent impaired, limited or restricted    Time: 1120-1140 PT Time Calculation (min) (ACUTE ONLY): 20 min   Charges:   PT Evaluation $Initial PT Evaluation Tier I: 1 Procedure     PT G Codes:   PT G-Codes **NOT FOR INPATIENT CLASS** Functional Assessment Tool Used: Clinical Judgement Functional Limitation: Self care Self Care Current Status (B1478): At least 1 percent but less than 20 percent impaired, limited or restricted Self Care Goal Status (G9562): 0 percent impaired, limited or restricted Self Care Discharge Status [phone]): 0 percent impaired, limited or restricted    [facility] 03/19/2015, 12:02 PM  Sheran Lawless, PT [phone] 03/19/2015  Sheran Lawless, PT [phone] 03/19/2015

## 2015-03-19 NOTE — Discharge Summary (Signed)
Family Medicine Teaching [facility] Discharge Summary  Patient name: Aaron Kennedy Medical record number: [id number] Date of birth: 11-26-1970 Age: 44 y.o. Gender: male Date of Admission: 03/18/2015  Date of Discharge: 03/19/15 Admitting Physician: Uvaldo Rising, MD  Primary Care Provider: Pcp Not In System Consultants: none  Indication for Hospitalization: Weakness/Numbness of right upper and lower extremity, slurred speech, difficulty with gait/balance  Discharge Diagnoses/Problem List:  [name] Newly Diagnosed Combined Systolic and Diastolic HF HTN Tobacco Use  Disposition: Home  Discharge Condition: Improved/Essentially Resolved   Discharge Exam:   Brief Hospital Course:   [name] with Right Sided Numbness/Weakness, slurred speech, and difficulty with balance:  Patient presented with the above symptoms. Code stroke was not called in the ED due to delay in presentation. CT head was negative for hemorrhage/infarct. MRI was negative for acute findings. Symptoms were gradually improving while in the ED. Initial labs were essentially unremarkable except for marijuana on drug screen. Blood pressure was elevated through out hospitalization. Patient admitted to noncompliance with home Norvasc due to feeling dizzy when taking medication. Patient was given high dose ASA in the ED, then started on ASA  daily. Risk stratification labs were obtained and are noted below. 10 year ASCVD risk score for patient was ~17%, therefore patient was started on Lipitor . Cartoic [place] was negative. ECHO significant for systolic and diastolic dysfunction (noted below).   Newly Diagnosed Systolic and Diastolic HF:  New diagnosis according to ECHO findings (noted below). Patient was started on Beta Blocker and ACE Inhibitor. EKG showed Q waves in inferior-lateral leads. I-stat troponin negative in ED. Patient denied symptoms of chest pain/shortness of breath.  Right Ankle Pain around Lateral  Malleolus: Patient noted of history of injury about 1 year, but was not evaluated at that point. Denies recent re-injury. Symptoms started the morning of admission. Patinet had normal ROM with only some tenderness on lateral malleolus and without edema. X-ray of ankle was obtained and showed no acute injury but noted corticated ossific fragments adjacent to medial malleolus likely reflecting sequela of prior avulsive injury. Symptoms improved with home Gabapentin PRN and Tylenol PRN.   HTN: Blood pressure was elevated on presentation. Patient was started on Lisinopril. BMET was ordered as a future lab as outpatient. Patient was scheduled a lab visit in one week.   Tobacco Use:  Patient smokes ~1PPD for ~43 years. Interested in quitting. Discharged with nicotine patch  and Nicorete gum.    Issues for Follow Up:  - follow up BMET after starting Lisinopril: ordered as a future lab at [facility]. Patient had a lab visit on 12/7 - follow up Hemoglobin A1c results - patient will benefit from outpatient referral to cardiology - follow up blood pressure with new anti-hypertensive - follow up compliance with new medications - follow up tobacco cessation efforts   Significant Procedures: none  Significant Labs and Imaging:   Recent Labs Lab 03/18/15 1014 03/18/15 1022 03/19/15 0546  WBC 8.2  --  7.8  HGB 16.1 16.7 16.1  HCT 46.1 49.0 46.6  PLT 182  --  165    Recent Labs Lab 03/18/15 1014 03/18/15 1022 03/19/15 0546  NA 139 141 140  K 4.2 4.0 4.1  CL 110 106 108  CO2 24  --  25  GLUCOSE 94 93 86  BUN CREATININE 0.83 0.80 0.98  CALCIUM 8.6*  --  8.6*  ALKPHOS 103  --   --   AST 13*  --   --  ALT 19  --   --   ALBUMIN 3.7  --   --    ECHO:  EF 45-50%, G2DD, mildly dilated right atrium   MRI Brain: CLINICAL DATA: Right-sided weakness. Right facial droop.  EXAM: MRI HEAD WITHOUT CONTRAST  TECHNIQUE: Multiplanar, multiecho pulse sequences of the brain and  surrounding structures were obtained without intravenous contrast.  COMPARISON: Head CT 03/18/2015  FINDINGS: No acute infarct is identified. Increased signal along the anterior left aspect of the pons and medulla on axial diffusion weighted images is felt to be artifact. There is no evidence of intracranial hemorrhage, mass, midline shift, or extra-axial fluid collection.  Small foci of T2 hyperintensity are scattered throughout the subcortical and deep cerebral white matter bilaterally, moderately advanced for age. Mildly dilated perivascular spaces are noted about the basal ganglia, most notably in the region of the posterior limb of the right internal capsule.  Orbits are unremarkable. Scattered mucous retention cysts and mild mucosal thickening are noted in the paranasal sinuses. Secretions are noted in the right sphenoid sinus. The mastoid air cells are clear. There is mild enlargement of the cisterna magna. Major intracranial vascular flow voids are preserved.  IMPRESSION: 1. No acute intracranial abnormality. 2. Moderately advanced cerebral white matter disease for age, nonspecific. This may reflect chronic small vessel ischemia in this patient with hypertension. Other considerations include the sequelae of trauma, hypercoagulable state, vasculitis, migraines, prior infection and demyelination.  CT Head:  IMPRESSION: No acute intracranial abnormality. No definite acute cortical infarction. Mild mucosal thickening with partial opacification right sphenoid sinus  Carotid US: Vascular Ultrasound Carotid Duplex (Doppler) has been completed.  Findings suggest 1-39% internal carotid artery stenosis bilaterally. Vertebral arteries are patent with antegrade flow.  Xray R Ankle: FINDINGS: There is no evidence of fracture, dislocation, or joint effusion. There are multiple well corticated ossific fragments adjacent to the medial malleolus likely reflecting sequela  of prior avulsive injury. There is a anterior tibial marginal osteophyte. 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 Soft tissues are unremarkable.  IMPRESSION: No acute osseous injury of the right ankle.  Results/Tests Pending at Time of Discharge: Hemoglobin A1c  Discharge Medications:    Medication List    STOP taking these medications        amLODipine 10 MG tablet  Commonly known as:  NORVASC     oxyCODONE-acetaminophen 5-325 MG tablet  Commonly known as:  PERCOCET/ROXICET      TAKE these medications        acyclovir 200 MG capsule  Commonly known as:  ZOVIRAX  Take 1 capsule (200 mg total) by mouth every morning.     amitriptyline 50 MG tablet  Commonly known as:  ELAVIL  TAKE THREE TABLETS BY MOUTH AT BEDTIME.     aspirin 81 MG EC tablet  Take 1 tablet (81 mg total) by mouth daily.  Start taking on:  03/20/2015     atorvastatin 40 MG tablet  Commonly known as:  LIPITOR  Take 1 tablet (40 mg total) by mouth daily at 6 PM.     gabapentin 600 MG tablet  Commonly known as:  NEURONTIN  Take 600-1,200 mg by mouth daily as needed (leg pain).     hydroxypropyl methylcellulose / hypromellose 2.5 % ophthalmic solution  Commonly known as:  ISOPTO TEARS / GONIOVISC  Place 1 drop into both eyes 4 (four) times daily as needed for dry eyes.     lisinopril 10 MG tablet  Commonly known  as:  PRINIVIL,ZESTRIL  Take 1 tablet (10 mg total) by mouth daily.  Start taking on:  03/20/2015     metoprolol succinate 25 MG 24 hr tablet  Commonly known as:  TOPROL-XL  Take 1 tablet (25 mg total) by mouth daily.  Start taking on:  03/20/2015     NEXIUM PO  Take 1 capsule by mouth daily as needed (heartburn).     nicotine 21 mg/24hr patch  Commonly known as:  NICODERM CQ - dosed in mg/24 hours  Place 1 patch (21 mg total) onto the skin once.     nicotine polacrilex 2 MG gum  Commonly known as:  NICORETTE  Take 1 each (2 mg total) by mouth as needed for smoking  cessation.        Discharge Instructions: Please refer to Patient Instructions section of EMR for full details.  Patient was counseled important signs and symptoms that should prompt return to medical care, changes in medications, dietary instructions, activity restrictions, and follow up appointments.   Follow-Up Appointments:     Follow-up Information    Follow up with Uvaldo Rising, MD On 04/03/2015.   Specialty:  Family Medicine   Why:  at 8:30 AM for hospital follow up    Contact information:   [address] [place] [address] [phone]       Follow up with Lab Appointment  On 03/26/2015.   Why:  at 8:30AM to get blood work done   [employer] information:   [address] [place] [address] [phone]      Palma Holter, MD 03/19/2015, 1:17 PM PGY-1, [facility]

## 2015-03-19 NOTE — Progress Notes (Signed)
OT Cancellation Note  Patient Details Name: BRINDEN LEVISON MRN: [id number] DOB: 02-19-1971   Cancelled Treatment:    Reason Eval/Treat Not Completed: Patient at procedure or test/ unavailable (Echo). Will attempt to see later today.   Nils Pyle, OTR/L 03/19/2015, 9:16 AM

## 2015-03-19 NOTE — Progress Notes (Signed)
OT Cancellation Note  Patient Details Name: Aaron Kennedy MRN: [id number] DOB: 1970/07/16   Cancelled Treatment:    Reason Eval/Treat Not Completed: OT screened, no needs identified, will sign off. Reviewed and provided education for fall prevention and signs and symptoms.   Nils Pyle, OTR/L 03/19/2015, 1:40 PM

## 2015-03-19 NOTE — Progress Notes (Signed)
  Echocardiogram 2D Echocardiogram has been performed.  Aaron Kennedy 03/19/2015, 9:35 AM

## 2015-03-19 NOTE — Discharge Instructions (Signed)
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 This was likely due to your elevated blood pressures. We started you on a few medications that will help reduce the risk of another similar event in the future.  - You were started on a cholesterol medication (Atorvastatin) - You were also started on two blood pressure medications (Metoprolol and Lisinopril) - You should also take Aspirin  daily - You have an appointment with Dr. Cristal Ford for hospital follow up (on 12/15) - Dr. Randolm Idol would like you to get blood work done before you see him in clinic. You have a lab appointment on 12/7 at 8:30AM to get blood work done.  - We also prescribed Nicotine patches and gum to help you quit smoking  - You can continue to take Tylenol as needed for ankle pain    Transient Ischemic Attack A transient ischemic attack ([name]) is a "warning stroke" that causes stroke-like symptoms. Unlike a stroke, a [name] does not cause permanent damage to the brain. The symptoms of a [name] can happen very fast and do not last long. It is important to know the symptoms of a [name] and what to do. This can help prevent a major stroke or death. CAUSES  A [name] is caused by a temporary blockage in an artery in the brain or neck (carotid artery). The blockage does not allow the brain to get the blood supply it needs and can cause different symptoms. The blockage can be caused by either:  A blood clot.  Fatty buildup (plaque) in a neck or brain artery. RISK FACTORS  High blood pressure (hypertension).  High cholesterol.  Diabetes mellitus.  Heart disease.  The buildup of plaque in the blood vessels (peripheral artery disease or atherosclerosis).  The buildup of plaque in the blood vessels that provide blood and oxygen to the brain (carotid artery stenosis).  An abnormal heart rhythm (atrial fibrillation).  Obesity.  Using any tobacco products, including cigarettes, chewing tobacco, or electronic  cigarettes.  Taking oral contraceptives, especially in combination with using tobacco.  Physical inactivity.  A diet high in fats, salt (sodium), and calories.  Excessive alcohol use.  Use of illegal drugs (especially cocaine and methamphetamine).  Being male.  Being African American.  Being over the age of 64 years.  Family history of stroke.  Previous history of blood clots, stroke, [name], or heart attack.  Sickle cell disease. SIGNS AND SYMPTOMS  [name] symptoms are the same as a stroke but are temporary. These symptoms usually develop suddenly, or may be newly present upon waking from sleep:  Sudden weakness or numbness of the face, arm, or leg, especially on one side of the body.  Sudden trouble walking or difficulty moving arms or legs.  Sudden confusion.  Sudden personality changes.  Trouble speaking (aphasia) or understanding.  Difficulty swallowing.  Sudden trouble seeing in one or both eyes.  Double vision.  Dizziness.  Loss of balance or coordination.  Sudden severe headache with no known cause.  Trouble reading or writing.  Loss of bowel or bladder control.  Loss of consciousness. DIAGNOSIS  Your health care provider may be able to determine the presence or absence of a [name] based on your symptoms, history, and physical exam. CT scan of the brain is usually performed to help identify a [name]. Other tests may include:  Electrocardiography (ECG).  Continuous heart monitoring.  Echocardiography.  Carotid ultrasonography.  MRI.  A scan of the brain circulation.  Blood tests. TREATMENT  Since the symptoms of [name] are the same as a stroke, it is important to seek treatment as soon as possible. You may need a medicine to dissolve a blood clot (thrombolytic) if that is the cause of the [name]. This medicine cannot be given if too much time has passed. Treatment may also include:   Rest, oxygen, fluids through an IV tube, and medicines to thin the blood  (anticoagulants).  Measures will be taken to prevent short-term and long-term complications, including infection from breathing foreign material into the lungs (aspiration pneumonia), blood clots in the legs, and falls.  Procedures to either remove plaque in the carotid arteries or dilate carotid arteries that have narrowed due to plaque. Those procedures are:  Carotid endarterectomy.  Carotid angioplasty and stenting.  Medicines and diet may be used to address diabetes, high blood pressure, and other underlying risk factors. HOME CARE INSTRUCTIONS   Take medicines only as directed by your health care provider. Follow the directions carefully. Medicines may be used to control risk factors for a stroke. Be sure you understand all your medicine instructions.  You may be told to take aspirin or the anticoagulant warfarin. Warfarin needs to be taken exactly as instructed.  Taking too much or too little warfarin is dangerous. Too much warfarin increases the risk of bleeding. Too little warfarin continues to allow the risk for blood clots. While taking warfarin, you will need to have regular blood tests to measure your blood clotting time. A PT blood test measures how long it takes for blood to clot. Your PT is used to calculate another value called an INR. Your PT and INR help your health care provider to adjust your dose of warfarin. The dose can change for many reasons. It is critically important that you take warfarin exactly as prescribed.  Many foods, especially foods high in vitamin K can interfere with warfarin and affect the PT and INR. Foods high in vitamin K include spinach, kale, broccoli, cabbage, collard and turnip greens, Brussels sprouts, peas, cauliflower, seaweed, and parsley, as well as beef and pork liver, green tea, and soybean oil. You should eat a consistent amount of foods high in vitamin K. Avoid major changes in your diet, or notify your health care provider before changing  your diet. Arrange a visit with a dietitian to answer your questions.  Many medicines can interfere with warfarin and affect the PT and INR. You must tell your health care provider about any and all medicines you take; this includes all vitamins and supplements. Be especially cautious with aspirin and anti-inflammatory medicines. Do not take or discontinue any prescribed or over-the-counter medicine except on the advice of your health care provider or pharmacist.  Warfarin can have side effects, such as excessive bruising or bleeding. You will need to hold pressure over cuts for longer than usual. Your health care provider or pharmacist will discuss other potential side effects.  Avoid sports or activities that may cause injury or bleeding.  Be careful when shaving, flossing your teeth, or handling sharp objects.  Alcohol can change the body's ability to handle warfarin. It is best to avoid alcoholic drinks or consume only very small amounts while taking warfarin. Notify your health care provider if you change your alcohol intake.  Notify your dentist or other health care providers before procedures.  Eat a diet that includes 5 or more servings of fruits and vegetables each day. This may reduce the risk of stroke. Certain diets may be prescribed to  address high blood pressure, high cholesterol, diabetes, or obesity.  A diet low in sodium, saturated fat, trans fat, and cholesterol is recommended to manage high blood pressure.  A diet low in saturated fat, trans fat, and cholesterol, and high in fiber may control cholesterol levels.  A controlled-carbohydrate, controlled-sugar diet is recommended to manage diabetes.  A reduced-calorie diet that is low in sodium, saturated fat, trans fat, and cholesterol is recommended to manage obesity.  Maintain a healthy weight.  Stay physically active. It is recommended that you get at least 30 minutes of activity on most or all days.  Do not use any  tobacco products, including cigarettes, chewing tobacco, or electronic cigarettes. If you need help quitting, ask your health care provider.  Limit alcohol intake to no more than 1 drink per day for nonpregnant women and 2 drinks per day for men. One drink equals 12 ounces of beer, 5 ounces of wine, or 1 ounces of hard liquor.  Do not abuse drugs.  A safe home environment is important to reduce the risk of falls. Your health care provider may arrange for specialists to evaluate your home. Having grab bars in the bedroom and bathroom is often important. Your health care provider may arrange for equipment to be used at home, such as raised toilets and a seat for the shower.  Follow all instructions for follow-up with your health care provider. This is very important. This includes any referrals and lab tests. Proper follow-up can prevent a stroke or another [name] from occurring. PREVENTION  The risk of a [name] can be decreased by appropriately treating high blood pressure, high cholesterol, diabetes, heart disease, and obesity, and by quitting smoking, limiting alcohol, and staying physically active. SEEK MEDICAL CARE IF:  You have personality changes.  You have difficulty swallowing.  You are seeing double.  You have dizziness.  You have a fever. SEEK IMMEDIATE MEDICAL CARE IF:  Any of the following symptoms may represent a serious problem that is an emergency. Do not wait to see if the symptoms will go away. Get medical help right away. Call your local emergency services (911 in [place] Do not drive yourself to the hospital.  You have sudden weakness or numbness of the face, arm, or leg, especially on one side of the body.  You have sudden trouble walking or difficulty moving arms or legs.  You have sudden confusion.  You have trouble speaking (aphasia) or understanding.  You have sudden trouble seeing in one or both eyes.  You have a loss of balance or coordination.  You have a  sudden, severe headache with no known cause.  You have new chest pain or an irregular heartbeat.  You have a partial or total loss of consciousness. MAKE SURE YOU:   Understand these instructions.  Will watch your condition.  Will get help right away if you are not doing well or get worse.   This information is not intended to replace advice given to you by your health care provider. Make sure you discuss any questions you have with your health care provider.   Document Released: 01/13/2005 Document Revised: 04/26/2014 Document Reviewed: 07/11/2013 Elsevier Interactive Patient Education 2016 [employer]  Hydrochlorothiazide, HCTZ; Metoprolol tablets What is this medicine? METOPROLOL; HYDROCHLOROTHIAZIDE (me TOE proe lole; hye droe klor oh THYE a zide) is a combination of a beta-blocker and a diuretic. It is used to treat high blood pressure. This medicine may be used for other purposes; ask your health  care provider or pharmacist if you have questions. What should I tell my health care provider before I take this medicine? They need to know if you have any of these conditions: -circulation problems, or blood vessel disease -decreased urine -diabetes -heart disease, heart failure or a history of heart attack -kidney disease -liver disease -lung or breathing disease, like asthma or emphysema -pheochromocytoma -slow heart rate -thyroid disease -an unusual or allergic reaction to hydrochlorothiazide, metoprolol, sulfa drugs, or other medicines, foods, dyes, or preservatives -pregnant or trying to get pregnant -breast-feeding How should I use this medicine? Take this medicine by mouth with a glass of water. Follow the directions on the prescription label. Take this medicine with food. Take your medicine at regular intervals. Do not take it more often than directed. Do not stop taking except on your doctor's advice. Talk to your pediatrician regarding the use of this medicine in  children. Special care may be needed. Overdosage: If you think you have taken too much of this medicine contact a poison control center or emergency room at once. NOTE: This medicine is only for you. Do not share this medicine with others. What if I miss a dose? If you miss a dose, take it as soon as you can. If it is almost time for your next dose, take only that dose. Do not take double or extra doses. What may interact with this medicine? -antiinflammatory drugs, NSAIDs like ibuprofen -barbiturates like phenobarbital -corticosteroids like prednisone -lithium -medicines for chest pain or angina -medicines for diabetes -medicines for high blood pressure or heart failure -medicines to control heart rhythm -prescription pain medicines -rifampin -skeletal muscle relaxants like tubocurarine -some medicines for lowering cholesterol like colestipol or cholestyramine This list may not describe all possible interactions. Give your health care provider a list of all the medicines, herbs, non-prescription drugs, or dietary supplements you use. Also tell them if you smoke, drink alcohol, or use illegal drugs. Some items may interact with your medicine. What should I watch for while using this medicine? Visit your doctor or health care professional for regular checks on your progress. Check your blood pressure as directed. Ask your doctor or health care professional what your blood pressure should be and when you should contact him or her. Check with your doctor or health care professional if you get an attack of severe diarrhea, nausea and vomiting, or if you sweat a lot. The loss of too much body fluid can make it dangerous for you to take this medicine. You may get drowsy or dizzy. Do not drive, use machinery, or do anything that needs mental alertness until you know how this drug affects you. Do not stand or sit up quickly, especially if you are an older patient. This reduces the risk of dizzy or  fainting spells. Alcohol can make you more drowsy and dizzy. Avoid alcoholic drinks. This medicine may affect your blood sugar level. If you have diabetes, check with your doctor or health care professional before changing the dose of your diabetic medicine. This medicine can make you more sensitive to the sun. Keep out of the sun. If you cannot avoid being in the sun, wear protective clothing and use sunscreen. Do not use sun lamps or tanning beds/booths. Do not treat yourself for coughs, colds, or pain while you are taking this medicine without asking your doctor or health care professional for advice. Some ingredients may increase your blood pressure. What side effects may I notice from receiving this medicine? Side  effects that you should report to your doctor or health care professional as soon as possible: -changes in vision -cold, tingling, or numb hands or feet -difficulty breathing, wheezing -eye pain -increased thirst or sweating -irregular heart beat, palpitations, or chest pain -muscle cramps -redness, blistering, peeling or loosening of the skin, including inside the mouth -swollen legs or ankles -tremor, shakes -unusual skin rash or bruising -unusually weak or tired -vomiting -worsened gout pain -yellowing of the eyes or skin Side effects that usually do not require medical attention (report to your doctor or health care professional if they continue or are bothersome): -change in sex drive or performance -cough -depression -diarrhea -nausea This list may not describe all possible side effects. Call your doctor for medical advice about side effects. You may report side effects to FDA at 1-800-FDA-1088. Where should I keep my medicine? Keep out of the reach of children. Store at room temperature between 15 and 30 degrees C (59 and 86 degrees F). Protect from light and moisture. Keep container tightly closed. Throw away any unused medicine after the expiration date. NOTE:  This sheet is a summary. It may not cover all possible information. If you have questions about this medicine, talk to your doctor, pharmacist, or health care provider.    2016, Elsevier/Gold Standard. (2012-03-24 13:16:58)  Lisinopril tablets What is this medicine? LISINOPRIL (lyse IN oh pril) is an ACE inhibitor. This medicine is used to treat high blood pressure and heart failure. It is also used to protect the heart immediately after a heart attack. This medicine may be used for other purposes; ask your health care provider or pharmacist if you have questions. What should I tell my health care provider before I take this medicine? They need to know if you have any of these conditions: -diabetes -heart or blood vessel disease -kidney disease -low blood pressure -previous swelling of the tongue, face, or lips with difficulty breathing, difficulty swallowing, hoarseness, or tightening of the throat -an unusual or allergic reaction to lisinopril, other ACE inhibitors, insect venom, foods, dyes, or preservatives -pregnant or trying to get pregnant -breast-feeding How should I use this medicine? Take this medicine by mouth with a glass of water. Follow the directions on your prescription label. You may take this medicine with or without food. If it upsets your stomach, take it with food. Take your medicine at regular intervals. Do not take it more often than directed. Do not stop taking except on your doctor's advice. Talk to your pediatrician regarding the use of this medicine in children. Special care may be needed. While this drug may be prescribed for children as young as 22 years of age for selected conditions, precautions do apply. Overdosage: If you think you have taken too much of this medicine contact a poison control center or emergency room at once. NOTE: This medicine is only for you. Do not share this medicine with others. What if I miss a dose? If you miss a dose, take it as soon  as you can. If it is almost time for your next dose, take only that dose. Do not take double or extra doses. What may interact with this medicine? Do not take this medicine with any of the following medications: -hymenoptera venomThis medicines may also interact with the following medications: -aliskiren -angiotensin receptor blockers, like losartan or valsartan -certain medicines for diabetes -diuretics -everolimus -gold compounds -lithium -NSAIDs, medicines for pain and inflammation, like ibuprofen or naproxen -potassium salts or supplements -salt substitutes -sirolimus -  temsirolimus This list may not describe all possible interactions. Give your health care provider a list of all the medicines, herbs, non-prescription drugs, or dietary supplements you use. Also tell them if you smoke, drink alcohol, or use illegal drugs. Some items may interact with your medicine. What should I watch for while using this medicine? Visit your doctor or health care professional for regular check ups. Check your blood pressure as directed. Ask your doctor what your blood pressure should be, and when you should contact him or her. Do not treat yourself for coughs, colds, or pain while you are using this medicine without asking your doctor or health care professional for advice. Some ingredients may increase your blood pressure. Women should inform their doctor if they wish to become pregnant or think they might be pregnant. There is a potential for serious side effects to an unborn child. Talk to your health care professional or pharmacist for more information. Check with your doctor or health care professional if you get an attack of severe diarrhea, nausea and vomiting, or if you sweat a lot. The loss of too much body fluid can make it dangerous for you to take this medicine. You may get drowsy or dizzy. Do not drive, use machinery, or do anything that needs mental alertness until you know how this drug  affects you. Do not stand or sit up quickly, especially if you are an older patient. This reduces the risk of dizzy or fainting spells. Alcohol can make you more drowsy and dizzy. Avoid alcoholic drinks. Avoid salt substitutes unless you are told otherwise by your doctor or health care professional. What side effects may I notice from receiving this medicine? Side effects that you should report to your doctor or health care professional as soon as possible: -allergic reactions like skin rash, itching or hives, swelling of the hands, feet, face, lips, throat, or tongue -breathing problems -signs and symptoms of kidney injury like trouble passing urine or change in the amount of urine -signs and symptoms of increased potassium like muscle weakness; chest pain; or fast, irregular heartbeat -signs and symptoms of liver injury like dark yellow or brown urine; general ill feeling or flu-like symptoms; light-colored stools; loss of appetite; nausea; right upper belly pain; unusually weak or tired; yellowing of the eyes or skin -signs and symptoms of low blood pressure like dizziness; feeling faint or lightheaded, falls; unusually weak or tired -stomach pain with or without nausea and vomiting Side effects that usually do not require medical attention (report to your doctor or health care professional if they continue or are bothersome): -changes in taste -cough -dizziness -fever -headache -sensitivity to light This list may not describe all possible side effects. Call your doctor for medical advice about side effects. You may report side effects to FDA at 1-800-FDA-1088. Where should I keep my medicine? Keep out of the reach of children. Store at room temperature between 15 and 30 degrees C (59 and 86 degrees F). Protect from moisture. Keep container tightly closed. Throw away any unused medicine after the expiration date. NOTE: This sheet is a summary. It may not cover all possible information. If  you have questions about this medicine, talk to your doctor, pharmacist, or health care provider.    2016, Elsevier/Gold Standard. (2014-11-28 20:38:20)  Nicotine chewing gum What is this medicine? NICOTINE (NIK oh teen) helps people stop smoking. This medicine replaces the nicotine found in cigarettes and helps to decrease withdrawal effects. It is most effective when  used in combination with a stop-smoking program. This medicine may be used for other purposes; ask your health care provider or pharmacist if you have questions. What should I tell my health care provider before I take this medicine? They need to know if you have any of these conditions: -diabetes -heart disease, angina, irregular heartbeat or previous heart attack -high blood pressure -lung disease, including asthma -overactive thyroid -pheochromocytoma -seizures or history of seizures -stomach problems or ulcers -an unusual or allergic reaction to nicotine, other medicines, foods, dyes, or preservatives -pregnant or trying to get pregnant -breast-feeding How should I use this medicine? Chew but do not swallow the gum. Follow the directions that come with the chewing gum. Use exactly as directed. When you feel an urgent desire for a cigarette, chew one piece of gum slowly. Continue chewing until you taste the gum or feel a slight tingling in your mouth. Then, stop chewing and place the gum between your cheek and gum. Wait until the taste or tingling is almost gone then start chewing again. Continue chewing in this manner for about 30 minutes. Slow chewing helps reduce cravings and also helps reduce the chance for heartburn or other gastrointestinal side effects. Talk to your pediatrician regarding the use of this medicine in children. Special care may be needed. Overdosage: If you think you have taken too much of this medicine contact a poison control center or emergency room at once. NOTE: This medicine is only for you. Do  not share this medicine with others. What if I miss a dose? This does not apply. Only use the chewing gum when you have a strong desire to smoke. Do not use more than one piece of gum at a time. What may interact with this medicine? -medicines for asthma -medicines for blood pressure -medicines for mental depression This list may not describe all possible interactions. Give your health care provider a list of all the medicines, herbs, non-prescription drugs, or dietary supplements you use. Also tell them if you smoke, drink alcohol, or use illegal drugs. Some items may interact with your medicine. What should I watch for while using this medicine? Always carry the nicotine gum with you. Do not use more than 30 pieces of gum a day. Too much gum can increase the risk of an overdose. As the urge to smoke gets less, gradually reduce the number of pieces each day over a period of 2 to 3 months. When you are only using 1 or 2 pieces a day, stop using the nicotine gum. You should begin using the nicotine gum the day you stop smoking. It is okay if you do not succeed with the attempt to quit and have a cigarette. You can still continue your quit attempt and keep using the product as directed. Just throw away your cigarettes and get back to your quit plan. If your mouth gets sore from chewing the gum, suck hard sugarless candy between pieces of gum to help relieve the soreness. Brush your teeth regularly to reduce mouth irritation. If you wear dentures, contact your doctor or health care professional if the gum sticks to your dental work. If you are a diabetic and you quit smoking, the effects of insulin may be increased and you may need to reduce your insulin dose. Check with your doctor or health care professional about how you should adjust your insulin dose. What side effects may I notice from receiving this medicine? Side effects that you should report to your doctor or  health care professional as soon as  possible: -allergic reactions like skin rash, itching or hives, swelling of the face, lips, or tongue -blisters in mouth -breathing problems -changes in hearing -changes in vision -chest pain -cold sweats -confusion -fast, irregular heartbeat -feeling faint or lightheaded, falls -headache -increased saliva -nausea, vomiting -stomach pain -weakness Side effects that usually do not require medical attention (report to your doctor or health care professional if they continue or are bothersome): -diarrhea -dry mouth -hiccups -irritability -nervousness or restlessness -trouble sleeping or vivid dreams This list may not describe all possible side effects. Call your doctor for medical advice about side effects. You may report side effects to FDA at 1-800-FDA-1088. Where should I keep my medicine? Keep out of the reach of children. Store at room temperature between 15 and 30 degrees C (59 and 86 degrees F). Protect from heat and light. Throw away unused medicine after the expiration date. NOTE: This sheet is a summary. It may not cover all possible information. If you have questions about this medicine, talk to your doctor, pharmacist, or health care provider.    2016, Elsevier/Gold Standard. (2014-09-30 19:37:14)  Nicotine skin patches What is this medicine? NICOTINE (NIK [place]) helps people stop smoking. The patches replace the nicotine found in cigarettes and help to decrease withdrawal effects. They are most effective when used in combination with a stop-smoking program. This medicine may be used for other purposes; ask your health care provider or pharmacist if you have questions. What should I tell my health care provider before I take this medicine? They need to know if you have any of these conditions: -diabetes -heart disease, angina, irregular heartbeat or previous heart attack -high blood pressure -lung disease, including asthma -overactive  thyroid -pheochromocytoma -seizures or a history of seizures -skin problems, like eczema -stomach problems or ulcers -an unusual or allergic reaction to nicotine, adhesives, other medicines, foods, dyes, or preservatives -pregnant or trying to get pregnant -breast-feeding How should I use this medicine? This medicine is for use on the skin. Follow the directions that come with the patches. Find an area of skin on your upper arm, chest, or back that is clean, dry, greaseless, undamaged and hairless. Wash hands with plain soap and water. Do not use anything that contains aloe, lanolin or glycerin as these may prevent the patch from sticking. Dry thoroughly. Remove the patch from the sealed pouch. Do not try to cut or trim the patch. Using your palm, press the patch firmly in place for 10 seconds to make sure that there is good contact with your skin. After applying the patch, wash your hands. Change the patch every day, keeping to a regular schedule. When you apply a new patch, use a new area of skin. Wait at least 1 week before using the same area again. Talk to your pediatrician regarding the use of this medicine in children. Special care may be needed. Overdosage: If you think you have taken too much of this medicine contact a poison control center or emergency room at once. NOTE: This medicine is only for you. Do not share this medicine with others. What if I miss a dose? If you forget to replace a patch, use it as soon as you can. Only use one patch at a time and do not leave on the skin for longer than directed. If a patch falls off, you can replace it, but keep to your schedule and remove the patch at the right time. What may  interact with this medicine? -medicines for asthma -medicines for blood pressure -medicines for mental depression This list may not describe all possible interactions. Give your health care provider a list of all the medicines, herbs, non-prescription drugs, or dietary  supplements you use. Also tell them if you smoke, drink alcohol, or use illegal drugs. Some items may interact with your medicine. What should I watch for while using this medicine? You should begin using the nicotine patch the day you stop smoking. It is okay if you do not succeed at your attempt to quit and have a cigarette. You can still continue your quit attempt and keep using the product as directed. Just throw away your cigarettes and get back to your quit plan. You can keep the patch in place during swimming, bathing, and showering. If your patch falls off during these activities, replace it. When you first apply the patch, your skin may itch or burn. This should go away soon. When you remove a patch, the skin may look red, but this should only last for a few days. Call your doctor or health care professional if skin redness does not go away after 4 days, if your skin swells, or if you get a rash. If you are a diabetic and you quit smoking, the effects of insulin may be increased and you may need to reduce your insulin dose. Check with your doctor or health care professional about how you should adjust your insulin dose. If you are going to have a magnetic resonance imaging (MRI) procedure, tell your MRI technician if you have this patch on your body. It must be removed before a MRI. What side effects may I notice from receiving this medicine? Side effects that you should report to your doctor or health care professional as soon as possible: -allergic reactions like skin rash, itching or hives, swelling of the face, lips, or tongue -breathing problems -changes in hearing -changes in vision -chest pain -cold sweats -confusion -fast, irregular heartbeat -feeling faint or lightheaded, falls -headache -increased saliva -skin redness that lasts more than 4 days -stomach pain -signs and symptoms of nicotine overdose like nausea; vomiting; dizziness; weakness; and rapid heartbeat Side effects  that usually do not require medical attention (report to your doctor or health care professional if they continue or are bothersome): -diarrhea -dry mouth -hiccups -irritability -nervousness or restlessness -trouble sleeping or vivid dreams This list may not describe all possible side effects. Call your doctor for medical advice about side effects. You may report side effects to FDA at 1-800-FDA-1088. Where should I keep my medicine? Keep out of the reach of children. Store at room temperature between 20 and 25 degrees C (68 and 77 degrees F). Protect from heat and light. Store in Tax inspector until ready to use. Throw away unused medicine after the expiration date. When you remove a patch, fold with sticky sides together; put in an empty opened pouch and throw away. NOTE: This sheet is a summary. It may not cover all possible information. If you have questions about this medicine, talk to your doctor, pharmacist, or health care provider.    2016, Elsevier/Gold Standard. (2014-03-04 15:46:21)  Hypertension Hypertension, commonly called high blood pressure, is when the force of blood pumping through your arteries is too strong. Your arteries are the blood vessels that carry blood from your heart throughout your body. A blood pressure reading consists of a higher number over a lower number, such as 110/72. The higher number (systolic) is the  pressure inside your arteries when your heart pumps. The lower number (diastolic) is the pressure inside your arteries when your heart relaxes. Ideally you want your blood pressure below 120/80. Hypertension forces your heart to work harder to pump blood. Your arteries may become narrow or stiff. Having untreated or uncontrolled hypertension can cause heart attack, stroke, kidney disease, and other problems. RISK FACTORS Some risk factors for high blood pressure are controllable. Others are not.  Risk factors you cannot control include:   Race.  You may be at higher risk if you are African American.  Age. Risk increases with age.  Gender. Men are at higher risk than women before age 56 years. After age 76, women are at higher risk than men. Risk factors you can control include:  Not getting enough exercise or physical activity.  Being overweight.  Getting too much fat, sugar, calories, or salt in your diet.  Drinking too much alcohol. SIGNS AND SYMPTOMS Hypertension does not usually cause signs or symptoms. Extremely high blood pressure (hypertensive crisis) may cause headache, anxiety, shortness of breath, and nosebleed. DIAGNOSIS To check if you have hypertension, your health care provider will measure your blood pressure while you are seated, with your arm held at the level of your heart. It should be measured at least twice using the same arm. Certain conditions can cause a difference in blood pressure between your right and left arms. A blood pressure reading that is higher than normal on one occasion does not mean that you need treatment. If it is not clear whether you have high blood pressure, you may be asked to return on a different day to have your blood pressure checked again. Or, you may be asked to monitor your blood pressure at home for 1 or more weeks. TREATMENT Treating high blood pressure includes making lifestyle changes and possibly taking medicine. Living a healthy lifestyle can help lower high blood pressure. You may need to change some of your habits. Lifestyle changes may include:  Following the DASH diet. This diet is high in fruits, vegetables, and whole grains. It is low in salt, red meat, and added sugars.  Keep your sodium intake below 2,300 mg per day.  Getting at least 30-45 minutes of aerobic exercise at least 4 times per week.  Losing weight if necessary.  Not smoking.  Limiting alcoholic beverages.  Learning ways to reduce stress. Your health care provider may prescribe medicine if  lifestyle changes are not enough to get your blood pressure under control, and if one of the following is true:  You are 77-31 years of age and your systolic blood pressure is above 140.  You are 44 years of age or older, and your systolic blood pressure is above 150.  Your diastolic blood pressure is above 90.  You have diabetes, and your systolic blood pressure is over 140 or your diastolic blood pressure is over 90.  You have kidney disease and your blood pressure is above 140/90.  You have heart disease and your blood pressure is above 140/90. Your personal target blood pressure may vary depending on your medical conditions, your age, and other factors. HOME CARE INSTRUCTIONS  Have your blood pressure rechecked as directed by your health care provider.   Take medicines only as directed by your health care provider. Follow the directions carefully. Blood pressure medicines must be taken as prescribed. The medicine does not work as well when you skip doses. Skipping doses also puts you at risk for  problems.  Do not smoke.   Monitor your blood pressure at home as directed by your health care provider. SEEK MEDICAL CARE IF:   You think you are having a reaction to medicines taken.  You have recurrent headaches or feel dizzy.  You have swelling in your ankles.  You have trouble with your vision. SEEK IMMEDIATE MEDICAL CARE IF:  You develop a severe headache or confusion.  You have unusual weakness, numbness, or feel faint.  You have severe chest or abdominal pain.  You vomit repeatedly.  You have trouble breathing. MAKE SURE YOU:   Understand these instructions.  Will watch your condition.  Will get help right away if you are not doing well or get worse.   This information is not intended to replace advice given to you by your health care provider. Make sure you discuss any questions you have with your health care provider.   Document Released: 04/05/2005  Document Revised: 08/20/2014 Document Reviewed: 01/26/2013 Elsevier Interactive Patient Education [employer]

## 2015-03-19 NOTE — Care Management Note (Signed)
Case Management Note  Patient Details  Name: MARSH HECKLER MRN: [id number] Date of Birth: 08/09/1970  Subjective/Objective:                    Action/Plan: CM met with patient to discuss discharge needs. Patient is listed as self pay.  Patient states that he has applied for Medicaid in the past, but was recently denied.  He verbalized intent to explore Obamacare.  CM spoke with Benjamine Mola in [employer], who will contact patient after discharge. Patient is aware and agrees to this plan.   Patient has a follow-up appointment scheduled with Dr Ree Kida.  Patient states that his girlfriend has already taken care of picking up his prescriptions, which were electronically sent to [facility]. He denies any discharge needs.  Expected Discharge Date:                  Expected Discharge Plan:  Home/Self Care  In-House Referral:  Financial Counselor  Discharge planning Services     Post Acute Care Choice:    Choice offered to:  Patient  DME Arranged:    DME Agency:     HH Arranged:    [place] Agency:     Status of Service:  Completed, signed off  Medicare Important Message Given:    Date Medicare IM Given:    Medicare IM give by:    Date Additional Medicare IM Given:    Additional Medicare Important Message give by:     If discussed at [place] of Stay Meetings, dates discussed:    Additional Comments:  Rolm Baptise, RN 03/19/2015, 1:22 PM

## 2015-03-19 NOTE — Progress Notes (Signed)
*  PRELIMINARY RESULTS* Vascular Ultrasound Carotid Duplex (Doppler) has been completed.   Findings suggest 1-39% internal carotid artery stenosis bilaterally. Vertebral arteries are patent with antegrade flow.  03/19/2015 9:55 AM Gertie Fey, RVT, RDCS, RDMS

## 2015-03-20 ENCOUNTER — Telehealth: Payer: Self-pay | Admitting: Obstetrics and Gynecology

## 2015-03-20 LAB — HEMOGLOBIN A1C
HEMOGLOBIN A1C: 6 % — AB (ref 4.8–5.6)
MEAN PLASMA GLUCOSE: 126 mg/dL

## 2015-03-20 NOTE — Telephone Encounter (Signed)
Family Medicine Emergency Line Telephone Note  Aaron Kennedy called stating that he was just discharged from the hospital yesterday for [name]. He states that he felt well all yesterday but woke up this morning feeling stiff and weak. Only endorsing weakness in his right hand. States that their is no associated tingling like before just weakness. Feels as though he cannot use his hand the same. Patient also denies slurred speech and gait abnormalities. He called wondering if this was normal.  After reviewing d/c summary from hospitalization it looks like full stroke work-up was done and negative for any source. He was started on ASA and Lipitor. I advised patient that symptoms sound benign at this time and likely secondary residual affects from [name]. He would likely benefit greatly with some PT. I discussed some exercises he could do to strengthen hand. Advised patient to see how symptoms are and if progresses or he gets other symptoms to come to ED to be evaluated. Also suggested that he call the clinic in the morning to discuss this with PCP.   Patient voiced understanding and agreed to plan.   Caryl Ada, DO 03/20/2015, 6:50 PM PGY-2,  Family Medicine

## 2015-03-21 ENCOUNTER — Emergency Department (HOSPITAL_COMMUNITY): Payer: Self-pay

## 2015-03-21 ENCOUNTER — Inpatient Hospital Stay (HOSPITAL_COMMUNITY): Payer: Self-pay

## 2015-03-21 ENCOUNTER — Encounter (HOSPITAL_COMMUNITY): Payer: Self-pay | Admitting: Family Medicine

## 2015-03-21 ENCOUNTER — Inpatient Hospital Stay (HOSPITAL_COMMUNITY)
Admission: EM | Admit: 2015-03-21 | Discharge: 2015-03-22 | DRG: 065 | Disposition: A | Discharge status: Home / Self Care | Payer: Self-pay | Attending: Family Medicine | Admitting: Family Medicine

## 2015-03-21 DIAGNOSIS — I10 Essential (primary) hypertension: Secondary | ICD-10-CM | POA: Diagnosis present

## 2015-03-21 DIAGNOSIS — G8191 Hemiplegia, unspecified affecting right dominant side: Secondary | ICD-10-CM | POA: Diagnosis present

## 2015-03-21 DIAGNOSIS — Z8673 Personal history of transient ischemic attack (TIA), and cerebral infarction without residual deficits: Secondary | ICD-10-CM

## 2015-03-21 DIAGNOSIS — E785 Hyperlipidemia, unspecified: Secondary | ICD-10-CM | POA: Diagnosis present

## 2015-03-21 DIAGNOSIS — G35 Multiple sclerosis: Secondary | ICD-10-CM | POA: Diagnosis present

## 2015-03-21 DIAGNOSIS — M6289 Other specified disorders of muscle: Secondary | ICD-10-CM

## 2015-03-21 DIAGNOSIS — Z8249 Family history of ischemic heart disease and other diseases of the circulatory system: Secondary | ICD-10-CM

## 2015-03-21 DIAGNOSIS — R7303 Prediabetes: Secondary | ICD-10-CM | POA: Diagnosis present

## 2015-03-21 DIAGNOSIS — Z823 Family history of stroke: Secondary | ICD-10-CM

## 2015-03-21 DIAGNOSIS — Z79899 Other long term (current) drug therapy: Secondary | ICD-10-CM

## 2015-03-21 DIAGNOSIS — M25571 Pain in right ankle and joints of right foot: Secondary | ICD-10-CM | POA: Diagnosis present

## 2015-03-21 DIAGNOSIS — I739 Peripheral vascular disease, unspecified: Secondary | ICD-10-CM | POA: Diagnosis present

## 2015-03-21 DIAGNOSIS — R531 Weakness: Secondary | ICD-10-CM

## 2015-03-21 DIAGNOSIS — I639 Cerebral infarction, unspecified: Principal | ICD-10-CM | POA: Diagnosis present

## 2015-03-21 DIAGNOSIS — Z7982 Long term (current) use of aspirin: Secondary | ICD-10-CM

## 2015-03-21 DIAGNOSIS — F17200 Nicotine dependence, unspecified, uncomplicated: Secondary | ICD-10-CM | POA: Diagnosis present

## 2015-03-21 DIAGNOSIS — I6350 Cerebral infarction due to unspecified occlusion or stenosis of unspecified cerebral artery: Secondary | ICD-10-CM

## 2015-03-21 HISTORY — DX: Cerebral infarction, unspecified: I63.9

## 2015-03-21 LAB — I-STAT TROPONIN, ED: Troponin i, poc: 0 ng/mL (ref 0.00–0.08)

## 2015-03-21 LAB — COMPREHENSIVE METABOLIC PANEL
ALBUMIN: 3.9 g/dL (ref 3.5–5.0)
ALT: 17 U/L (ref 17–63)
ANION GAP: 10 (ref 5–15)
AST: 13 U/L — ABNORMAL LOW (ref 15–41)
Alkaline Phosphatase: 116 U/L (ref 38–126)
BILIRUBIN TOTAL: 1.2 mg/dL (ref 0.3–1.2)
BUN: 11 mg/dL (ref 6–20)
CO2: 23 mmol/L (ref 22–32)
CREATININE: 0.81 mg/dL (ref 0.61–1.24)
Calcium: 9.5 mg/dL (ref 8.9–10.3)
Chloride: 106 mmol/L (ref 101–111)
GLUCOSE: 118 mg/dL — AB (ref 65–99)
POTASSIUM: 3.8 mmol/L (ref 3.5–5.1)
Sodium: 139 mmol/L (ref 135–145)
Total Protein: 7.5 g/dL (ref 6.5–8.1)

## 2015-03-21 LAB — CBC
HCT: 49.9 % (ref 39.0–52.0)
HEMOGLOBIN: 17.6 g/dL — AB (ref 13.0–17.0)
MCH: 33.1 pg (ref 26.0–34.0)
MCHC: 35.3 g/dL (ref 30.0–36.0)
MCV: 94 fL (ref 78.0–100.0)
Platelets: 187 10*3/uL (ref 150–400)
RBC: 5.31 MIL/uL (ref 4.22–5.81)
RDW: 12.2 % (ref 11.5–15.5)
WBC: 11.1 10*3/uL — ABNORMAL HIGH (ref 4.0–10.5)

## 2015-03-21 LAB — I-STAT CHEM 8, ED
BUN: 13 mg/dL (ref 6–20)
CREATININE: 0.8 mg/dL (ref 0.61–1.24)
Calcium, Ion: 1.1 mmol/L — ABNORMAL LOW (ref 1.12–1.23)
Chloride: 103 mmol/L (ref 101–111)
Glucose, Bld: 125 mg/dL — ABNORMAL HIGH (ref 65–99)
HEMATOCRIT: 55 % — AB (ref 39.0–52.0)
HEMOGLOBIN: 18.7 g/dL — AB (ref 13.0–17.0)
POTASSIUM: 3.7 mmol/L (ref 3.5–5.1)
Sodium: 140 mmol/L (ref 135–145)
TCO2: 23 mmol/L (ref 0–100)

## 2015-03-21 LAB — DIFFERENTIAL
Basophils Absolute: 0 10*3/uL (ref 0.0–0.1)
Basophils Relative: 0 %
EOS ABS: 0.1 10*3/uL (ref 0.0–0.7)
EOS PCT: 1 %
LYMPHS ABS: 3.5 10*3/uL (ref 0.7–4.0)
Lymphocytes Relative: 31 %
Monocytes Absolute: 1 10*3/uL (ref 0.1–1.0)
Monocytes Relative: 9 %
NEUTROS PCT: 59 %
Neutro Abs: 6.4 10*3/uL (ref 1.7–7.7)

## 2015-03-21 LAB — APTT: aPTT: 34 seconds (ref 24–37)

## 2015-03-21 LAB — CBG MONITORING, ED: GLUCOSE-CAPILLARY: 106 mg/dL — AB (ref 65–99)

## 2015-03-21 LAB — PROTIME-INR
INR: 1.07 (ref 0.00–1.49)
PROTHROMBIN TIME: 14.1 s (ref 11.6–15.2)

## 2015-03-21 MED ORDER — ALPRAZOLAM 0.25 MG PO TABS
1.0000 mg | ORAL_TABLET | Freq: Once | ORAL | Status: AC
  Administered 2015-03-21: 1 mg via ORAL
  Filled 2015-03-21: qty 4

## 2015-03-21 MED ORDER — SODIUM CHLORIDE 0.9 % IV SOLN
240.0000 mg | Freq: Once | INTRAVENOUS | Status: AC
  Administered 2015-03-21: 240 mg via INTRAVENOUS
  Filled 2015-03-21: qty 1.92

## 2015-03-21 MED ORDER — FAMOTIDINE 20 MG PO TABS
20.0000 mg | ORAL_TABLET | Freq: Once | ORAL | Status: AC
  Administered 2015-03-21: 20 mg via ORAL
  Filled 2015-03-21: qty 1

## 2015-03-21 MED ORDER — GADOBENATE DIMEGLUMINE 529 MG/ML IV SOLN
20.0000 mL | Freq: Once | INTRAVENOUS | Status: AC | PRN
  Administered 2015-03-21: 20 mL via INTRAVENOUS

## 2015-03-21 NOTE — ED Notes (Signed)
THE NPT HAS BEEN TO [facility] AND JUST RETURNED .  HE PASSED HIS SWALLOW SCREEN

## 2015-03-21 NOTE — ED Provider Notes (Signed)
CSN: [id number]     Arrival date & time 03/21/15  1607 History   First MD Initiated Contact with Patient 03/21/15 1645     Chief Complaint  Patient presents with  . Extremity Weakness  . Numbness     (Consider location/radiation/quality/duration/timing/severity/associated sxs/prior Treatment) HPI  Patient is a 44 year old male with past medical history significant for hypertension, recent [name], who presents to the emergency department with right-sided numbness and weakness. Patient was recently discharged from hospital 2 days ago following a [name] workup. Patient presented with slurring of speech and right upper and lower extremity numbness and weakness that resolved. CT head and MRI brain were unremarkable, patient was admitted for a [name] workup. His workup was overall unremarkable. He was started on antihypertensive medications and an aspirin. He was back to his baseline Wednesday and went to sleep with normal strength and sensation. States he woke up Thursday morning with right upper and lower extremity weakness with decreased sensation. States this feels exactly like his symptoms when he was diagnosed with a [name]. Denies missing any medication doses. Denies falls or head injury. Denies headache, change in vision, dizziness, difficulty swallowing, slurring of speech, chest pain, shortness of breath, fevers, chills.   Past Medical History  Diagnosis Date  . Hypertension   . Stroke [facility])    Past Surgical History  Procedure Laterality Date  . Hernia repair    . Leg surgery     Family History  Problem Relation Age of Onset  . Stroke Brother     55s  . Heart attack Sister     3s   Social History  Substance Use Topics  . Smoking status: Current Every Day Smoker -- 1.00 packs/day for 20 years  . Smokeless tobacco: None  . Alcohol Use: Yes    Review of Systems  Constitutional: Negative for fever and appetite change.  HENT: Negative for congestion, drooling and trouble swallowing.    Eyes: Negative for photophobia and visual disturbance.  Respiratory: Negative for chest tightness and shortness of breath.   Cardiovascular: Negative for chest pain, palpitations and leg swelling.  Gastrointestinal: Negative for nausea, vomiting, abdominal pain and blood in stool.  Genitourinary: Negative for dysuria, hematuria, flank pain and decreased urine volume.  Musculoskeletal: Positive for gait problem. Negative for back pain and neck pain.  Skin: Negative for rash and wound.  Neurological: Positive for weakness and numbness. Negative for dizziness, seizures, facial asymmetry, speech difficulty, light-headedness and headaches.  Psychiatric/Behavioral: Negative for behavioral problems.      Allergies  Review of patient's allergies indicates no known allergies.  Home Medications   Prior to Admission medications   Medication Sig Start Date End Date Taking? Authorizing Provider  amitriptyline (ELAVIL) 50 MG tablet TAKE THREE TABLETS BY MOUTH AT BEDTIME. 03/19/15  Yes Palma Holter, MD  aspirin EC 81 MG EC tablet Take 1 tablet (81 mg total) by mouth daily. 03/20/15  Yes Palma Holter, MD  atorvastatin (LIPITOR) 40 MG tablet Take 1 tablet (40 mg total) by mouth daily at 6 PM. 03/19/15  Yes Palma Holter, MD  Esomeprazole Magnesium (NEXIUM PO) Take 1 capsule by mouth daily as needed (heartburn).   Yes Historical Provider, MD  gabapentin (NEURONTIN) 600 MG tablet Take 600-1,200 mg by mouth daily as needed (leg pain).    Yes Historical Provider, MD  lisinopril (PRINIVIL,ZESTRIL) 10 MG tablet Take 1 tablet (10 mg total) by mouth daily. 03/20/15  Yes Palma Holter, MD  metoprolol succinate (  TOPROL-XL) 25 MG 24 hr tablet Take 1 tablet (25 mg total) by mouth daily. 03/20/15  Yes Palma Holter, MD  naphazoline-pheniramine (NAPHCON-A) 0.025-0.3 % ophthalmic solution Place 1 drop into both eyes daily as needed for irritation.   Yes Historical Provider, MD  acyclovir  (ZOVIRAX) 200 MG capsule Take 1 capsule (200 mg total) by mouth every morning. Patient not taking: Reported on 03/18/2015 05/24/13   [name], MD  nicotine (NICODERM CQ - DOSED IN MG/24 HOURS) 21 mg/24hr patch Place 1 patch (21 mg total) onto the skin once. Patient not taking: Reported on 03/21/2015 03/19/15   Palma Holter, MD  nicotine polacrilex (NICORETTE) 2 MG gum Take 1 each (2 mg total) by mouth as needed for smoking cessation. Patient not taking: Reported on 03/21/2015 03/19/15   Palma Holter, MD   BP 116/86 mmHg  Pulse 79  Temp(Src) 98.2 F (36.8 C) (Oral)  Resp 18  Ht 6' (1.829 m)  Wt 87.317 kg  BMI 26.10 kg/m2  SpO2 96% Physical Exam  Constitutional: He is oriented to person, place, and time. He appears well-developed and well-nourished. No distress.  HENT:  Head: Normocephalic and atraumatic.  Mouth/Throat: Oropharynx is clear and moist.  Eyes: Conjunctivae and EOM are normal. Pupils are equal, round, and reactive to light.  Neck: Normal range of motion. Neck supple. No JVD present. No tracheal deviation present.  Cardiovascular: Normal rate, regular rhythm, normal heart sounds and intact distal pulses.   Pulmonary/Chest: Effort normal and breath sounds normal. No respiratory distress.  Abdominal: Soft. He exhibits no distension. There is no tenderness. There is no rebound.  Musculoskeletal: Normal range of motion.  Neurological: He is alert and oriented to person, place, and time. He has normal reflexes. He displays no tremor. A sensory deficit is present. No cranial nerve deficit. He exhibits normal muscle tone. He displays no seizure activity. Gait abnormal. Coordination normal. GCS eye subscore is 4. GCS verbal subscore is 5. GCS motor subscore is 6. He displays no Babinski's sign on the right side. He displays no Babinski's sign on the left side.  RUE and RLE strength 4/5. Pronator drift of RUE. Decreased sensation of RUE. Normal sensation of RLE. Gait  with dragging of right foot. Speech normal.  Skin: Skin is warm.  Psychiatric: He has a normal mood and affect.  Nursing note and vitals reviewed.   ED Course  Procedures (including critical care time) Labs Review Labs Reviewed  CBC - Abnormal; Notable for the following:    WBC 11.1 (*)    Hemoglobin 17.6 (*)    All other components within normal limits  COMPREHENSIVE METABOLIC PANEL - Abnormal; Notable for the following:    Glucose, Bld 118 (*)    AST 13 (*)    All other components within normal limits  CBG MONITORING, ED - Abnormal; Notable for the following:    Glucose-Capillary 106 (*)    All other components within normal limits  I-STAT CHEM 8, ED - Abnormal; Notable for the following:    Glucose, Bld 125 (*)    Calcium, Ion 1.10 (*)    Hemoglobin 18.7 (*)    HCT 55.0 (*)    All other components within normal limits  PROTIME-INR  APTT  DIFFERENTIAL  I-STAT TROPOININ, ED    Imaging Review Ct Head Wo Contrast  03/21/2015  CLINICAL DATA:  [name] and hypertension. Recent admission. Patient now unable to move RIGHT arm. EXAM: CT HEAD WITHOUT CONTRAST TECHNIQUE: Contiguous axial  images were obtained from the base of the skull through the vertex without intravenous contrast. COMPARISON:  03/18/2015. FINDINGS: No evidence for acute infarction, hemorrhage, mass lesion, hydrocephalus, or extra-axial fluid. Normal for age cerebral volume. Patchy hypoattenuation of the white matter confirmed to represent small vessel disease on prior MR. Incidental mega cisterna magna. Calvarium intact. Chronic sinus disease. Negative orbits. IMPRESSION: Negative exam.  No acute intracranial findings are evident. Electronically Signed   By: Elsie Stain M.D.   On: 03/21/2015 17:42   Mr Maxine Glenn Head Wo Contrast  03/21/2015  CLINICAL DATA:  RIGHT arm and RIGHT leg weakness which began 03/18/2015. Abnormal brain MRI. EXAM: MRA HEAD WITHOUT CONTRAST TECHNIQUE: Angiographic images of the Circle of Willis were  obtained using MRA technique without intravenous contrast. COMPARISON:  MRI brain 03/18/2015. CT head 03/18/2015. CT head 03/21/2015. FINDINGS: The internal carotid arteries are widely patent. The basilar artery is widely patent with vertebrals codominant. No intracranial stenosis or aneurysm. Mild non stenotic irregularity of the proximal LEFT A1 and mid M1 segments of the anterior and middle cerebral arteries respectively. No PCA disease. No cerebellar branch occlusion. No MCA branch occlusion. Specific attention was directed to the LEFT vertebral and LEFT PICA which show no evidence for dissection or thrombus. IMPRESSION: No flow-limiting stenosis, vertebral dissection, or LEFT PICA thrombosis. Electronically Signed   By: Elsie Stain M.D.   On: 03/21/2015 20:05   Mr Laqueta Jean HQ Contrast  03/21/2015  ADDENDUM REPORT: 03/21/2015 23:00 ADDENDUM: Correction: IMPRESSION: MR CERVICAL SPINE (not mra head): No MR findings of demyelination within the cervical spine. Electronically Signed   By: Awilda Metro M.D.   On: 03/21/2015 23:00  03/21/2015  CLINICAL DATA:  Acute on chronic RIGHT extremity weakness. Diagnosed with transient ischemic attack a few days prior. History of multiple sclerosis, hypertension. Assess stroke. EXAM: MRI HEAD WITHOUT AND WITH CONTRAST MRI CERVICAL SPINE WITHOUT AND WITH CONTRAST TECHNIQUE: Multiplanar, multiecho pulse sequences of the brain and surrounding structures, and cervical spine, to include the craniocervical junction and cervicothoracic junction, were obtained without and with intravenous contrast. CONTRAST:  20 cc MultiHance COMPARISON:  MRI of the brain March 18, 2015 FINDINGS: MRI HEAD FINDINGS Subcentimeter focus of reduced diffusion within the LEFT ventral pontomedullary junction, with corresponding low ADC value. Mild is associated expansile T2 bright signal. No superimposed enhancement. No additional foci of acute ischemia. No susceptibility artifact to suggest  hemorrhage. Ventricles and sulci are normal for patient's age. Greater than expected (greater than 10) supratentorial subcentimeter white matter T2 hyperintensities, unchanged from recent MRI a few which are periventricular in distribution, in addition to LEFT temporal/posterior insula cortical involvement, RIGHT para cingulate white matter changes. No midline shift, mass effect, mass lesions nor abnormal parenchymal enhancement. Small midline posterior fossa arachnoid cyst. No abnormal extra-axial fluid collections, leptomeningeal enhancement or extra-axial masses. Normal major intracranial vascular flow voids seen at the skull base. Mild to moderate pan paranasal sinusitis. Mastoid air cells are well aerated. No abnormal sellar expansion. No cerebellar tonsillar ectopia. No suspicious calvarial bone marrow signal. MRI CERVICAL SPINE FINDINGS Cervical vertebral bodies and posterior elements intact and aligned with maintenance of cervical lordosis. Intervertebral discs demonstrate normal morphology and signal characteristics. No abnormal osseous or intradiscal enhancement. No STIR signal abnormality to suggest acute osseous process. Cervical spinal cord is normal morphology and signal characteristics from the cervical medullary junction to level of T3-4, the most caudal well visualized level. No abnormal cord, leptomeningeal or epidural enhancement. Included prevertebral and paraspinal  soft tissues are normal. Level by level evaluation: C2-3: No significant disc bulge. Moderate LEFT facet arthropathy. No canal stenosis or neural foraminal narrowing. C3-4: Annular bulging, uncovertebral hypertrophy and mild facet arthropathy. Mild canal stenosis. Moderate bilateral neural foraminal narrowing. C4-5: Annular bulging, uncovertebral hypertrophy and mild facet arthropathy. No canal stenosis. Moderate bilateral neural foraminal narrowing. C5-6: Annular bulging, uncovertebral hypertrophy. No canal stenosis. Mild RIGHT,  moderate LEFT neural foraminal narrowing. C6-7: Small broad-based disc bulge, uncovertebral hypertrophy. Minimal canal stenosis. Moderate bilateral neural foraminal narrowing. C7-T1: No significant disc bulge, canal stenosis or neural foraminal narrowing. IMPRESSION: MRI HEAD: Subcentimeter focus of acute ischemia within LEFT ventral pontomedullary junction. Moderate white matter changes advanced for age predominantly representing chronic small vessel ischemic disease though, suspected component chronic demyelination without acute inflammation. No parenchymal brain volume loss for age. MRA HEAD: No MR findings of demyelination within the cervical spine. No acute fracture or malalignment. Mild canal stenosis C3-4, minimal at C6-7. Moderate neural foraminal narrowing C3-4 thru C6-7. Electronically Signed: By: Awilda Metro M.D. On: 03/21/2015 22:46   Mr Cervical Spine W Wo Contrast  03/21/2015  CLINICAL DATA:  Acute on chronic RIGHT extremity weakness. Diagnosed with transient ischemic attack a few days prior. History of multiple sclerosis, hypertension. Assess stroke. EXAM: MRI HEAD WITHOUT AND WITH CONTRAST MRI CERVICAL SPINE WITHOUT AND WITH CONTRAST TECHNIQUE: Multiplanar and multiecho pulse sequences of the cervical spine, to include the craniocervical junction and cervicothoracic junction, were obtained according to standard protocol without and with intravenous contrast. CONTRAST:  20mL MULTIHANCE GADOBENATE DIMEGLUMINE 529 MG/ML IV SOLN COMPARISON:  MRI of the brain March 18, 2015 FINDINGS: MRI HEAD FINDINGS: Subcentimeter focus of reduced diffusion within the LEFT ventral pontomedullary junction, with corresponding low ADC value. Mild is associated expansile T2 bright signal. No superimposed enhancement.No additional foci of acute ischemia. No susceptibility artifact to suggest hemorrhage. Ventricles and sulci are normal for patient's age. Greater than expected (greater than 10) supratentorial  subcentimeter white matter T2 hyperintensities, unchanged from recent MRI, a few which are periventricular in distribution, in addition to LEFT temporal/posterior insula cortical involvement, RIGHT para cingulate white matter changes. No midline shift, mass effect, mass lesions nor abnormal parenchymal enhancement. Small midline posterior fossa arachnoid cyst. No abnormal extra-axial fluid collections, leptomeningeal enhancement or extra-axial masses. Normal major intracranial vascular flow voids seen at the skull base.Mild to moderate pan paranasal sinusitis. Mastoid air cells are well aerated. No abnormal sellar expansion. No cerebellar tonsillar ectopia. No suspicious calvarial bone marrow signal. MRI CERVICAL SPINE FINDINGS: Cervical vertebral bodies and posterior elements intact and aligned with maintenance of cervical lordosis. Intervertebral discs demonstrate normal morphology and signal characteristics. No abnormal osseous or intradiscal enhancement. No STIR signal abnormality to suggest acute osseous process.Cervical spinal cord is normal morphology and signal characteristics from the cervical medullary junction to level of T3-4, the most caudal well visualized level. No abnormal cord, leptomeningeal or epidural enhancement. Included prevertebral and paraspinal soft tissues are normal. Level by level evaluation: C2-3: No significant disc bulge. Moderate LEFT facet arthropathy. No canal stenosis or neural foraminal narrowing. C3-4: Annular bulging, uncovertebral hypertrophy and mild facet arthropathy. Mild canal stenosis. Moderate bilateral neural foraminal narrowing. C4-5: Annular bulging, uncovertebral hypertrophy and mild facet arthropathy. No canal stenosis. Moderate bilateral neural foraminal narrowing. C5-6: Annular bulging, uncovertebral hypertrophy. No canal stenosis. Mild RIGHT, moderate LEFT neural foraminal narrowing. C6-7: Small broad-based disc bulge, uncovertebral hypertrophy. Minimal canal  stenosis. Moderate bilateral neural foraminal narrowing. C7-T1: No significant disc bulge, canal  stenosis or neural foraminal narrowing IMPRESSION: MRI HEAD: Subcentimeter focus of acute ischemia within LEFT ventral pontomedullary junction. Moderate white matter changes advanced for age predominantly representing chronic small vessel ischemic disease though, suspected component chronic demyelination without acute inflammation. No parenchymal brain volume loss for age. MR CERVICAL SPINE: No MR findings of demyelination within the cervical spine. No acute fracture or malalignment. Mild canal stenosis C3-4, minimal at C6-7. Moderate neural foraminal narrowing C3-4 thru C6-7. Electronically Signed   By: Awilda Metro M.D.   On: 03/21/2015 22:59   I have personally reviewed and evaluated these images and lab results as part of my medical decision-making.   EKG Interpretation   Date/Time:  Friday March 21 2015 16:22:18 EST Ventricular Rate:  68 PR Interval:  150 QRS Duration: 86 QT Interval:  378 QTC Calculation: 401 R Axis:   40 Text Interpretation:  Normal sinus rhythm Nonspecific T wave abnormality  Abnormal ECG No significant change since last tracing Confirmed by  Ethelda Chick  MD, SAM [phone]) on 03/21/2015 7:21:10 PM      MDM   Final diagnoses:  Stroke [facility])  Right sided weakness   Patient is a 44 year old male with past medical history significant for hypertension, recent diagnosis of [name], who presents to the emergency department with right upper and lower extremity weakness with decreased sensation. Similar to complaints that patient had been admitted for [name] 3 days ago, however at this time does not have slurring of speech. On arrival no acute distress, not ill appearing. Afebrile, hemodynamically stable. Exam as above, notable for right-sided weakness with decreased sensation and abnormal gait.  Lab work unremarkable. CT head showed no acute findings. Neurology was consulted in  the emergency department. Reviewed the patient's MRI from his recent hospitalization. Stated it was concerning for multiple sclerosis. Recommended obtaining MRI brain and cervical spine with contrast, starting IV Solu-Medrol 250 mg every 6 hours.    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 Recommended canceling MRI of the cervical spine and obtaining an MRA. MRA showed no acute findings. Discussed this with Dr. Charlesetta Ivory, stated that after reevaluating the MRA and MRI that he was concerned for multiple sclerosis and recommended obtaining the MRI cervical spine with contrast.  Patient was given a Xanax prior to MRI cervical spine. Patient will be admitted to family medicine with neurology consult for further evaluation of his neurologic deficits. Uncertain at this time the patient's clinical picture is consistent with CVA versus multiple sclerosis. Patient stable for the floor, transferred to the floor in stable condition.    Corena Herter, MD 03/22/15 6045  Doug Sou, MD 03/25/15 2358

## 2015-03-21 NOTE — ED Notes (Signed)
The pt is c/o weaKNESS IN HBIS RT ARM AND HBIS RT LEG SINCE THIS AM 0700AM.  HE WAS HERE FOR A [name] LAST WEEK AND HE WAS AFRAID NOT TO COME IN.  ALERT ORIENTED SKIN WARM AND DRY.  HE HGAS SL DRIFT RT ARM  WEAK GRIP NO FACIAL DROOP  .  HEADACHE EARLIER TODAY  NONE NOW

## 2015-03-21 NOTE — ED Notes (Signed)
Report attempted. Floor not ready to take patient at this time.

## 2015-03-21 NOTE — Telephone Encounter (Signed)
Patient reports continued weakness of right UE and lE. No slurred speech. Patient counseled to return to the ED as he is at high risk for repeat [name]/CVA given recent admission for [name]. Patient agreeable with plan.

## 2015-03-21 NOTE — H&P (Signed)
Family Medicine Teaching [facility] Admission History and Physical Service Pager: [phone]  Patient name: Aaron Kennedy Medical record number: [id number] Date of birth: 12-16-1970 Age: 44 y.o. Gender: male  Primary Care Provider: Pcp Not In System Consultants: Neurology  Code Status: Full  Chief Complaint: Right-sided numbness, weakness  Assessment and Plan: Aaron Kennedy is a 44 y.o. male presenting with stroke. PMH is significant for hypertension, combined heart failure, tobacco use and prediabetes.   CVA: Right sided numbness & weakness.  Recently discharged 03/19/15 for [name] with similar symptoms that resolved during hospitalization.  Was started on ASA 81 mg daily and Lipitor 40 mg daily at bedtime during last hospitalization. Carotid Duplex (03/19/15) showed 1-39% internal carotid artery stenosis bilaterally. Vertebral arteries are patent with antegrade flow. MRI 03/21/15 showed: acute ischemia within LEFT ventral pontomedullary junction. Multiple family members with CVA or MI in their 59s.  - Admit to Tele - ASA 81mg  qd, Lipitor 40mg  qhs - Allow for permissive HTN as below - Elevate HOB - OOB w/ assistance - PT/OT/SLP consulted - Neuro consulted  Card: HTN, HLD (LDL 140 on 03/19/15), Newly Diagnosed Combined HF (ECHO 03/19/15 showed EF 45-50%, G1DD) - Holding Metoprolol XL 25mg  daily, Lisinopril 10mg  daily restart prn after 24 hours permissive HTN  Prediabetes:  A1c 6 (03/19/59) - Consider starting metformin  Tobacco Use: almost 1ppd ~43 years.  UDS positive for marijuana last hospitalization - Nicotine patch 21mg . - Nicorete gum   Right Ankle Pain: Notes of history of injury about 1 year ago, but was not evaluated. X-ray corticated ossific fragments adjacent to the medial malleolus likely reflecting sequela of prior avulsive injury, but no acute injury noted - Holding Gabapentin  - Tylenol PRN  FEN/GI: heart diet; SLIV Prophylaxis: Lovenox SQ  Disposition:  Tele; discharge pending PT/OT recs  History of Present Illness:  Aaron Kennedy is a 44 y.o. male presenting with right-sided numbness and weakness. He reports improvement upon discharge for [name], but then notice return of right-side weakness/numbness/heaviness the morning of 12/1 than have persisted since. Denies CP, SOB or palpitations. Reports compliance with medications. Decreased smoking but continue to smoke 2-3 cigs a day.   Review Of Systems: Per HPI with the following additions:  Otherwise the remainder of the systems were negative.  Patient Active Problem List   Diagnosis Date Noted  . Multiple sclerosis (HCC) 03/21/2015  . Right sided weakness 03/18/2015  . [name] (transient ischemic attack) 03/18/2015  . History of fracture of ankle   . Right ankle pain     Past Medical History: Past Medical History  Diagnosis Date  . Hypertension   . Stroke [facility])     Past Surgical History: Past Surgical History  Procedure Laterality Date  . Hernia repair    . Leg surgery      Social History: Social History  Substance Use Topics  . Smoking status: Current Every Day Smoker -- 1.00 packs/day for 20 years  . Smokeless tobacco: None  . Alcohol Use: Yes   Additional social history:  Please also refer to relevant sections of EMR.  Family History: Family History  Problem Relation Age of Onset  . Stroke Brother     57s  . Heart attack Sister     32s   Allergies and Medications: No Known Allergies No current facility-administered medications on file prior to encounter.   Current Outpatient Prescriptions on File Prior to Encounter  Medication Sig Dispense Refill  . amitriptyline (ELAVIL) 50 MG  tablet TAKE THREE TABLETS BY MOUTH AT BEDTIME. 90 tablet 0  . aspirin EC 81 MG EC tablet Take 1 tablet (81 mg total) by mouth daily. 30 tablet 0  . atorvastatin (LIPITOR) 40 MG tablet Take 1 tablet (40 mg total) by mouth daily at 6 PM. 30 tablet 0  . Esomeprazole Magnesium (NEXIUM PO)  Take 1 capsule by mouth daily as needed (heartburn).    . gabapentin (NEURONTIN) 600 MG tablet Take 600-1,200 mg by mouth daily as needed (leg pain).     Marland Kitchen lisinopril (PRINIVIL,ZESTRIL) 10 MG tablet Take 1 tablet (10 mg total) by mouth daily. 30 tablet 0  . metoprolol succinate (TOPROL-XL) 25 MG 24 hr tablet Take 1 tablet (25 mg total) by mouth daily. 30 tablet 0  . acyclovir (ZOVIRAX) 200 MG capsule Take 1 capsule (200 mg total) by mouth every morning. (Patient not taking: Reported on 03/18/2015) 90 capsule 3  . nicotine (NICODERM CQ - DOSED IN MG/24 HOURS) 21 mg/24hr patch Place 1 patch (21 mg total) onto the skin once. (Patient not taking: Reported on 03/21/2015) 28 patch 0  . nicotine polacrilex (NICORETTE) 2 MG gum Take 1 each (2 mg total) by mouth as needed for smoking cessation. (Patient not taking: Reported on 03/21/2015) 100 tablet 0    Objective: BP 140/88 mmHg  Pulse 73  Temp(Src) 97.9 F (36.6 C) (Oral)  Resp 18  Ht 6' (1.829 m)  Wt 192 lb 8 oz (87.317 kg)  BMI 26.10 kg/m2  SpO2 99% Exam: General: NAD Eyes: PERRLA ENTM: MM Neck: Supple, no bruits Cardiovascular: RRR no m/r/g Respiratory: CTAB, normal effort Abdomen: SNTND Skin: No rash Neuro: A&Ox3; PERRLA, EOMI, Right-side facial droop otherwise CN-3-12 intact. Dec sensation in right upper and lower extremity; right upper and lower strength 4/5 Psych: Normal mood  Labs and Imaging: CBC BMET   Recent Labs Lab 03/21/15 1637 03/21/15 1639  WBC 11.1*  --   HGB 17.6* 18.7*  HCT 49.9 55.0*  PLT 187  --     Recent Labs Lab 03/21/15 1637 03/21/15 1639  NA 139 140  K 3.8 3.7  CL 106 103  CO2 23  --   BUN 11 13  CREATININE 0.81 0.80  GLUCOSE 118* 125*  CALCIUM 9.5  --      Ct Head Wo Contrast  03/21/2015  CLINICAL DATA:  [name] and hypertension. Recent admission. Patient now unable to move RIGHT arm. EXAM: CT HEAD WITHOUT CONTRAST TECHNIQUE: Contiguous axial images were obtained from the base of the skull  through the vertex without intravenous contrast. COMPARISON:  03/18/2015. FINDINGS: No evidence for acute infarction, hemorrhage, mass lesion, hydrocephalus, or extra-axial fluid. Normal for age cerebral volume. Patchy hypoattenuation of the white matter confirmed to represent small vessel disease on prior MR. Incidental mega cisterna magna. Calvarium intact. Chronic sinus disease. Negative orbits. IMPRESSION: Negative exam.  No acute intracranial findings are evident. Electronically Signed   By: Elsie Stain M.D.   On: 03/21/2015 17:42   Mr Maxine Glenn Head Wo Contrast  03/21/2015  CLINICAL DATA:  RIGHT arm and RIGHT leg weakness which began 03/18/2015. Abnormal brain MRI. EXAM: MRA HEAD WITHOUT CONTRAST TECHNIQUE: Angiographic images of the Circle of Willis were obtained using MRA technique without intravenous contrast. COMPARISON:  MRI brain 03/18/2015. CT head 03/18/2015. CT head 03/21/2015. FINDINGS: The internal carotid arteries are widely patent. The basilar artery is widely patent with vertebrals codominant. No intracranial stenosis or aneurysm. Mild non stenotic irregularity of the proximal LEFT  A1 and mid M1 segments of the anterior and middle cerebral arteries respectively. No PCA disease. No cerebellar branch occlusion. No MCA branch occlusion. Specific attention was directed to the LEFT vertebral and LEFT PICA which show no evidence for dissection or thrombus. IMPRESSION: No flow-limiting stenosis, vertebral dissection, or LEFT PICA thrombosis. Electronically Signed   By: Elsie Stain M.D.   On: 03/21/2015 20:05   Mr Laqueta Jean ZO Contrast  03/21/2015  ADDENDUM REPORT: 03/21/2015 23:00 ADDENDUM: Correction: IMPRESSION: MR CERVICAL SPINE (not mra head): No MR findings of demyelination within the cervical spine. Electronically Signed   By: Awilda Metro M.D.   On: 03/21/2015 23:00  IMPRESSION: MRI HEAD: Subcentimeter focus of acute ischemia within LEFT ventral pontomedullary junction. Moderate white  matter changes advanced for age predominantly representing chronic small vessel ischemic disease though, suspected component chronic demyelination without acute inflammation. No parenchymal brain volume loss for age. MRA HEAD: No MR findings of demyelination within the cervical spine. No acute fracture or malalignment. Mild canal stenosis C3-4, minimal at C6-7. Moderate neural foraminal narrowing C3-4 thru C6-7. Electronically Signed: By: Awilda Metro M.D. On: 03/21/2015 22:46   Jamal Collin, MD 03/22/2015, 12:07 AM PGY-3, [facility] FPTS Intern pager: [phone], text pages welcome

## 2015-03-21 NOTE — Progress Notes (Signed)
Patient arrived from ED with VSS and reporting no pain. WIll orient to room and continue to monitor. Suzy Bouchard E, [place] 03/21/2015 2227

## 2015-03-21 NOTE — Consult Note (Signed)
Referring Physician: ED    Chief Complaint: right sided weakness  HPI:                                                                                                                                         Aaron Kennedy is an 44 y.o. male with a past medical history significant for HTN, HLD, and smoking, admitted to [facility] due to right sided weakness. Aaron Kennedy said that this past Tuesday he woke up around 3 am to go to work and noticed  " a heavy, numb feeling in the right arm and leg and I couldn't use my right arm". He did not seek immediate medical attention, but the weakness worsened and he presented to [facility] where he was admitted to the hospital on 11/30 where his symptoms improved, had [name] work up that was significant only for LDL 140 and hemoglobin A1c 6.0, and thus was discharged home on BP medication, ASA and statin. He indicated that he did improve substantially after discharge  but never was 100% back to baseline and yesterday morning woke up again with weakness of his right arm and leg, similar to what he had before. Denies associated vertigo, double vision, difficulty swallowing, slurred speech, imbalance, language or vision impairment. MRI/MRA brain were personally reviewed and showed an acute infarction at the left ventral pontomedullary region, with MRA that was unrevealing.   Date last known well: 03/17/15 Time last known well: unable to determine tPA Given: no, late presentation   Past Medical History  Diagnosis Date  . Hypertension     Past Surgical History  Procedure Laterality Date  . Hernia repair    . Leg surgery      History reviewed. No pertinent family history. Social History:  reports that he has been smoking.  He does not have any smokeless tobacco history on file. He reports that he drinks alcohol. He reports that he does not use illicit drugs. Family history: no epilepsy, MS, or brain tumor Allergies: No Known Allergies  Medications:                                                                                                                            Scheduled:  ROS:  History obtained from chart review and the patient  General ROS: negative for - chills, fatigue, fever, night sweats, weight gain or weight loss Psychological ROS: negative for - behavioral disorder, hallucinations, memory difficulties, mood swings or suicidal ideation Ophthalmic ROS: negative for - blurry vision, double vision, eye pain or loss of vision ENT ROS: negative for - epistaxis, nasal discharge, oral lesions, sore throat, tinnitus or vertigo Allergy and Immunology ROS: negative for - hives or itchy/watery eyes Hematological and Lymphatic ROS: negative for - bleeding problems, bruising or swollen lymph nodes Endocrine ROS: negative for - galactorrhea, hair pattern changes, polydipsia/polyuria or temperature intolerance Respiratory ROS: negative for - cough, hemoptysis, shortness of breath or wheezing Cardiovascular ROS: negative for - chest pain, dyspnea on exertion, edema or irregular heartbeat Gastrointestinal ROS: negative for - abdominal pain, diarrhea, hematemesis, nausea/vomiting or stool incontinence Genito-Urinary ROS: negative for - dysuria, hematuria, incontinence or urinary frequency/urgency Musculoskeletal ROS: negative for - joint swelling Neurological ROS: as noted in HPI Dermatological ROS: negative for rash and skin lesion changes  Physical exam:  Constitutional: well developed, pleasant male in no apparent distress. Blood pressure 140/88, pulse 73, temperature 97.9 F (36.6 C), temperature source Oral, resp. rate 18, height 6' (1.829 m), weight 87.317 kg (192 lb 8 oz), SpO2 99 %. Eyes: no jaundice or exophthalmos.  Head: normocephalic. Neck: supple, no bruits, no JVD. Cardiac: no murmurs. Lungs:  clear. Abdomen: soft, no tender, no mass. Extremities: no edema, clubbing, or cyanosis.  Skin: no rash  Neurologic Examination:                                                                                                      General: NAD Mental Status: Alert, oriented, thought content appropriate.  Speech fluent without evidence of aphasia.  Able to follow 3 step commands without difficulty. Cranial Nerves: II: Discs flat bilaterally; Visual fields grossly normal, pupils equal, round, reactive to light and accommodation III,IV, VI: ptosis not present, extra-ocular motions intact bilaterally V,VII: smile symmetric, facial light touch sensation normal bilaterally VIII: hearing normal bilaterally IX,X: uvula rises symmetrically XI: bilateral shoulder shrug XII: midline tongue extension without atrophy or fasciculations Motor: Significant for right hemiparesis arm>>leg Tone and bulk:normal tone throughout; no atrophy noted Sensory: Pinprick and light touch intact throughout, bilaterally Deep Tendon Reflexes:  Right: Upper Extremity   Left: Upper extremity   biceps (C-5 to C-6) 2/4   biceps (C-5 to C-6) 2/4 tricep (C7) 2/4    triceps (C7) 2/4 Brachioradialis (C6) 2/4  Brachioradialis (C6) 2/4  Lower Extremity Lower Extremity  quadriceps (L-2 to L-4) 2/4   quadriceps (L-2 to L-4) 2/4 Achilles (S1) 2/4   Achilles (S1) 2/4  Plantars: Right: downgoing   Left: downgoing Cerebellar: normal finger-to-nose,  normal heel-to-shin test Gait:  No tested due to multiple leads      Results for orders placed or performed during the hospital encounter of 03/21/15 (from the past 48 hour(s))  Protime-INR     Status: None   Collection Time: 03/21/15  4:37 PM  Result Value Ref Range  Prothrombin Time 14.1 11.6 - 15.2 seconds   INR 1.07 0.00 - 1.49  APTT     Status: None   Collection Time: 03/21/15  4:37 PM  Result Value Ref Range   aPTT 34 24 - 37 seconds  CBC     Status: Abnormal    Collection Time: 03/21/15  4:37 PM  Result Value Ref Range   WBC 11.1 (H) 4.0 - 10.5 K/uL   RBC 5.31 4.22 - 5.81 MIL/uL   Hemoglobin 17.6 (H) 13.0 - 17.0 g/dL   HCT 49.9 39.0 - 52.0 %   MCV 94.0 78.0 - 100.0 fL   MCH 33.1 26.0 - 34.0 pg   MCHC 35.3 30.0 - 36.0 g/dL   RDW 12.2 11.5 - 15.5 %   Platelets 187 150 - 400 K/uL  Differential     Status: None   Collection Time: 03/21/15  4:37 PM  Result Value Ref Range   Neutrophils Relative % 59 %   Neutro Abs 6.4 1.7 - 7.7 K/uL   Lymphocytes Relative 31 %   Lymphs Abs 3.5 0.7 - 4.0 K/uL   Monocytes Relative 9 %   Monocytes Absolute 1.0 0.1 - 1.0 K/uL   Eosinophils Relative 1 %   Eosinophils Absolute 0.1 0.0 - 0.7 K/uL   Basophils Relative 0 %   Basophils Absolute 0.0 0.0 - 0.1 K/uL  Comprehensive metabolic panel     Status: Abnormal   Collection Time: 03/21/15  4:37 PM  Result Value Ref Range   Sodium 139 135 - 145 mmol/L   Potassium 3.8 3.5 - 5.1 mmol/L   Chloride 106 101 - 111 mmol/L   CO2 23 22 - 32 mmol/L   Glucose, Bld 118 (H) 65 - 99 mg/dL   BUN 11 6 - 20 mg/dL   Creatinine, Ser 0.81 0.61 - 1.24 mg/dL   Calcium 9.5 8.9 - 10.3 mg/dL   Total Protein 7.5 6.5 - 8.1 g/dL   Albumin 3.9 3.5 - 5.0 g/dL   AST 13 (L) 15 - 41 U/L   ALT 17 17 - 63 U/L   Alkaline Phosphatase 116 38 - 126 U/L   Total Bilirubin 1.2 0.3 - 1.2 mg/dL   GFR calc non Af Amer >60 >60 mL/min   GFR calc Af Amer >60 >60 mL/min    Comment: (NOTE) The eGFR has been calculated using the CKD EPI equation. This calculation has not been validated in all clinical situations. eGFR's persistently <60 mL/min signify possible Chronic Kidney Disease.    Anion gap 10 5 - 15  I-Stat Chem 8, ED  (not at [facility])     Status: Abnormal   Collection Time: 03/21/15  4:39 PM  Result Value Ref Range   Sodium 140 135 - 145 mmol/L   Potassium 3.7 3.5 - 5.1 mmol/L   Chloride 103 101 - 111 mmol/L   BUN 13 6 - 20 mg/dL   Creatinine, Ser 0.80 0.61 - 1.24 mg/dL   Glucose, Bld  125 (H) 65 - 99 mg/dL   Calcium, Ion 1.10 (L) 1.12 - 1.23 mmol/L   TCO2 23 0 - 100 mmol/L   Hemoglobin 18.7 (H) 13.0 - 17.0 g/dL   HCT 55.0 (H) 39.0 - 52.0 %  I-stat troponin, ED (not at [facility])     Status: None   Collection Time: 03/21/15  4:40 PM  Result Value Ref Range   Troponin i, poc 0.00 0.00 - 0.08 ng/mL   Comment 3  Comment: Due to the release kinetics of cTnI, a negative result within the first hours of the onset of symptoms does not rule out myocardial infarction with certainty. If myocardial infarction is still suspected, repeat the test at appropriate intervals.   CBG monitoring, ED     Status: Abnormal   Collection Time: 03/21/15  4:52 PM  Result Value Ref Range   Glucose-Capillary 106 (H) 65 - 99 mg/dL   Ct Head Wo Contrast  03/21/2015  CLINICAL DATA:  [name] and hypertension. Recent admission. Patient now unable to move RIGHT arm. EXAM: CT HEAD WITHOUT CONTRAST TECHNIQUE: Contiguous axial images were obtained from the base of the skull through the vertex without intravenous contrast. COMPARISON:  03/18/2015. FINDINGS: No evidence for acute infarction, hemorrhage, mass lesion, hydrocephalus, or extra-axial fluid. Normal for age cerebral volume. Patchy hypoattenuation of the white matter confirmed to represent small vessel disease on prior MR. Incidental mega cisterna magna. Calvarium intact. Chronic sinus disease. Negative orbits. IMPRESSION: Negative exam.  No acute intracranial findings are evident. Electronically Signed   By: Staci Righter M.D.   On: 03/21/2015 17:42   Mr Jodene Nam Head Wo Contrast  03/21/2015  CLINICAL DATA:  RIGHT arm and RIGHT leg weakness which began 03/18/2015. Abnormal brain MRI. EXAM: MRA HEAD WITHOUT CONTRAST TECHNIQUE: Angiographic images of the Circle of Willis were obtained using MRA technique without intravenous contrast. COMPARISON:  MRI brain 03/18/2015. CT head 03/18/2015. CT head 03/21/2015. FINDINGS: The internal carotid arteries are  widely patent. The basilar artery is widely patent with vertebrals codominant. No intracranial stenosis or aneurysm. Mild non stenotic irregularity of the proximal LEFT A1 and mid M1 segments of the anterior and middle cerebral arteries respectively. No PCA disease. No cerebellar branch occlusion. No MCA branch occlusion. Specific attention was directed to the LEFT vertebral and LEFT PICA which show no evidence for dissection or thrombus. IMPRESSION: No flow-limiting stenosis, vertebral dissection, or LEFT PICA thrombosis. Electronically Signed   By: Staci Righter M.D.   On: 03/21/2015 20:05    Assessment: 44 y.o. male with new onset right sided weakness and acute left ventral pontomedullary infarct. Did not receive iv tPA due to late presentation. Complete stroke work up. Plavix. PT. Stroke team will resume care tomorrow.  Stroke Risk Factors -HTN, [name]  Plan: 1. HgbA1c, fasting lipid panel 2. MRI, MRA  of the brain without contrast 3. Echocardiogram 4. Carotid dopplers 5. Prophylactic therapy-plavix 6. Risk factor modification 7. Telemetry monitoring 8. Frequent neuro checks 9. PT/OT SLP  Dorian Pod, MD [facility] [phone]  03/21/2015, 10:48 PM

## 2015-03-21 NOTE — ED Notes (Signed)
No further  headahce

## 2015-03-21 NOTE — ED Notes (Signed)
Pt here for "tightness" and numbness in right arm and leg. sts recently dx with [name]. Sts sore under right arm and on right shoulder.

## 2015-03-21 NOTE — ED Provider Notes (Signed)
Patient awakened yesterday morning with right arm and right leg weakness. Similar to symptoms that he's had in the past. Patient discharged a few days ago with diagnosis of [name]. Nothing makes symptoms better or worse. No other associated symptoms. On exam patient is alert Glasgow Coma Score 15 HEENT exam no facial asymmetry neck supple no JVD lungs clear breath sounds heart regular rate and rhythm abdomen nondistended nontender neurologic cranial nerves II through XII grossly intact positive pronator drift. Motor strength of right upper extremity 4/ 5 right lower extremity 4 over 5, left upper extremity 5 over 5, left lower extremity 5 over 5 DTRs symmetric bilaterally knee jerk and ankle jerk biceps toes downgoing bilaterally. Neurology consult. Foot clinically patient may have multiple sclerosis. MRI scan of brain and cervical spine ordered however I spoke with Dr. Benard Rink from radiology patient has suffered new brainstem stroke seen on MRI scan. MRI of cervical spine canceled. Patient to be given aspirin therapy admitted to medical service  Doug Sou, MD 03/21/15 [phone]

## 2015-03-21 NOTE — ED Notes (Signed)
Pt going to mri 

## 2015-03-21 NOTE — Progress Notes (Signed)
Attempted report will try again later. Melina Schools, RN 03/21/2015 8:11 PM

## 2015-03-21 NOTE — ED Notes (Signed)
Neuro here to see  Pt just went to mri.  Med has not come up from pharmacy steroid

## 2015-03-22 ENCOUNTER — Encounter (HOSPITAL_COMMUNITY): Payer: Self-pay | Admitting: Family Medicine

## 2015-03-22 DIAGNOSIS — I10 Essential (primary) hypertension: Secondary | ICD-10-CM

## 2015-03-22 DIAGNOSIS — E785 Hyperlipidemia, unspecified: Secondary | ICD-10-CM

## 2015-03-22 DIAGNOSIS — I639 Cerebral infarction, unspecified: Secondary | ICD-10-CM | POA: Insufficient documentation

## 2015-03-22 DIAGNOSIS — R7303 Prediabetes: Secondary | ICD-10-CM

## 2015-03-22 DIAGNOSIS — F17200 Nicotine dependence, unspecified, uncomplicated: Secondary | ICD-10-CM

## 2015-03-22 DIAGNOSIS — I6302 Cerebral infarction due to thrombosis of basilar artery: Secondary | ICD-10-CM

## 2015-03-22 LAB — GLUCOSE, CAPILLARY: Glucose-Capillary: 99 mg/dL (ref 65–99)

## 2015-03-22 MED ORDER — AMITRIPTYLINE HCL 75 MG PO TABS
150.0000 mg | ORAL_TABLET | Freq: Every day | ORAL | Status: DC
  Administered 2015-03-22: 150 mg via ORAL
  Filled 2015-03-22: qty 2
  Filled 2015-03-22: qty 6

## 2015-03-22 MED ORDER — CLOPIDOGREL BISULFATE 75 MG PO TABS: 75.0000 mg | ORAL_TABLET | Freq: Every day | ORAL | Status: DC

## 2015-03-22 MED ORDER — ASPIRIN EC 81 MG PO TBEC: 81.0000 mg | DELAYED_RELEASE_TABLET | Freq: Every day | ORAL | Status: DC

## 2015-03-22 MED ORDER — NICOTINE POLACRILEX 2 MG MT GUM
2.0000 mg | CHEWING_GUM | OROMUCOSAL | Status: DC | PRN
  Filled 2015-03-22: qty 1

## 2015-03-22 MED ORDER — CLOPIDOGREL BISULFATE 75 MG PO TABS
75.0000 mg | ORAL_TABLET | Freq: Every day | ORAL | Status: DC
  Administered 2015-03-22: 75 mg via ORAL
  Filled 2015-03-22: qty 1

## 2015-03-22 MED ORDER — ATORVASTATIN CALCIUM 40 MG PO TABS
40.0000 mg | ORAL_TABLET | Freq: Every day | ORAL | Status: DC
  Administered 2015-03-22: 40 mg via ORAL
  Filled 2015-03-22: qty 1

## 2015-03-22 MED ORDER — NICOTINE 21 MG/24HR TD PT24: 21.0000 mg | MEDICATED_PATCH | Freq: Once | TRANSDERMAL | Status: DC

## 2015-03-22 MED ORDER — SENNOSIDES-DOCUSATE SODIUM 8.6-50 MG PO TABS: 1.0000 | ORAL_TABLET | Freq: Every evening | ORAL | Status: DC | PRN

## 2015-03-22 MED ORDER — ENOXAPARIN SODIUM 40 MG/0.4ML ~~LOC~~ SOLN
40.0000 mg | SUBCUTANEOUS | Status: DC
  Administered 2015-03-22: 40 mg via SUBCUTANEOUS
  Filled 2015-03-22: qty 0.4

## 2015-03-22 MED ORDER — METOPROLOL SUCCINATE ER 25 MG PO TB24
25.0000 mg | ORAL_TABLET | Freq: Every day | ORAL | Status: DC
  Administered 2015-03-22: 25 mg via ORAL
  Filled 2015-03-22: qty 1

## 2015-03-22 MED ORDER — STROKE: EARLY STAGES OF RECOVERY BOOK
Freq: Once | Status: AC
  Administered 2015-03-22

## 2015-03-22 NOTE — Progress Notes (Signed)
Pt is being discharged home. Discharge instructions were given to patient and family 

## 2015-03-22 NOTE — Evaluation (Signed)
Physical Therapy Evaluation Patient Details Name: Aaron Kennedy MRN: [id number] DOB: 07/20/1970 Today's Date: 03/22/2015   History of Present Illness  44 y.o. male admitted for slurred speech and R side weakness. 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 Pt had previous hospitalization 3 days ago for [name]. PMH consists of MS, active smoker, and HTN.  Clinical Impression  Pt is mod I to supervision with all functional mobility. No further acute care PT services indicated. Recommend OPPT at d/c to address strength deficits RUE/LE. PT signing off.    Follow Up Recommendations Outpatient PT;Supervision - Intermittent    Equipment Recommendations  None recommended by PT    Recommendations for Other Services       Precautions / Restrictions Precautions Precautions: None      Mobility  Bed Mobility Overal bed mobility: Independent                Transfers Overall transfer level: Modified independent                  Ambulation/Gait Ambulation/Gait assistance: Supervision Ambulation Distance (Feet): 350 Feet Assistive device: None Gait Pattern/deviations: Decreased stance time - right;Step-through pattern Gait velocity: WFL   General Gait Details: increased hip flexion/hip hiking R during swing phase with knee held in fully extended position.  Stairs            Wheelchair Mobility    Modified Rankin (Stroke Patients Only) Modified Rankin (Stroke Patients Only) Pre-Morbid Rankin Score: No symptoms Modified Rankin: Slight disability     Balance             Standing balance-Leahy Scale: Good                               Pertinent Vitals/Pain Pain Assessment: No/denies pain    Home Living Family/patient expects to be discharged to:: Private residence Living Arrangements: Spouse/significant other;Children Available Help at Discharge: Family;Friend(s);Available PRN/intermittently Type of Home: Apartment    Entrance Stairs-Rails: Right;Left Entrance Stairs-Number of Steps: flight Home Layout: One level Home Equipment: None      Prior Function Level of Independence: Independent         Comments: works in Runner, broadcasting/film/video   Dominant Hand: Right    Extremity/Trunk Assessment   Upper Extremity Assessment: RUE deficits/detail RUE Deficits / Details: strength deficits noted, extensors greater than flexors.          Lower Extremity Assessment: RLE deficits/detail RLE Deficits / Details: AROM WFL, hip flexors 4/5, knee flex 4/5, knee extension 4-/5, DF 3/5    Cervical / Trunk Assessment: Normal  Communication   Communication: No difficulties  Cognition Arousal/Alertness: Awake/alert Behavior During Therapy: WFL for tasks assessed/performed Overall Cognitive Status: Within Functional Limits for tasks assessed                      General Comments      Exercises        Assessment/Plan    PT Assessment All further PT needs can be met in the next venue of care  PT Diagnosis Abnormality of gait   PT Problem List Decreased strength;Decreased mobility  PT Treatment Interventions     PT Goals (Current goals can be found in the Care Plan section) Acute Rehab PT Goals PT Goal Formulation: All assessment and education complete, DC therapy    Frequency     Barriers to  discharge        Co-evaluation               End of Session Equipment Utilized During Treatment: Gait belt Activity Tolerance: Patient tolerated treatment well Patient left: in chair;with call bell/phone within reach;Other (comment) (MD in room) Nurse Communication: Mobility status         Time: 0762-2633 PT Time Calculation (min) (ACUTE ONLY): 25 min   Charges:   PT Evaluation $Initial PT Evaluation Tier I: 1 Procedure PT Treatments $Gait Training: 8-22 mins   PT G Codes:        Lorriane Shire 03/22/2015, 1:33 PM

## 2015-03-22 NOTE — Progress Notes (Signed)
   03/22/15 1415  Vitals  Temp 98.3 F (36.8 C)  Temp Source Oral  BP (!) 163/106 mmHg  BP Location Left Arm  BP Method Automatic  Patient Position (if appropriate) Sitting  Pulse Rate 74  Pulse Rate Source Dinamap  Resp 18  Oxygen Therapy  SpO2 100 %  O2 Device Room Air   VS was reported to family medecin teaching service. No new order given

## 2015-03-22 NOTE — Discharge Summary (Signed)
Family Medicine Teaching [facility] Discharge Summary  Patient name: Aaron Kennedy Medical record number: [id number] Date of birth: 06-03-1970 Age: 44 y.o. Gender: male Date of Admission: 03/21/2015  Date of Discharge: 03/22/15 Admitting Physician: Uvaldo Rising, MD  Primary Care Provider: Pcp Not In System Consultants: Neuro  Indication for Hospitalization: CVA  Discharge Diagnoses/Problem List:  Patient Active Problem List   Diagnosis Date Noted  . Stroke (HCC) 03/22/2015  . HTN (hypertension) 03/22/2015  . HLD (hyperlipidemia) 03/22/2015  . Prediabetes 03/22/2015  . Tobacco use disorder 03/22/2015  . Multiple sclerosis (HCC) 03/21/2015  . Right sided weakness 03/18/2015  . [name] (transient ischemic attack) 03/18/2015  . History of fracture of ankle   . Right ankle pain    Disposition: Home  Discharge Condition: stable  Discharge Exam: See Progress note  Brief Hospital Course:  Aaron Kennedy is a 44 y.o. male presented with stroke (left ventral pontomedullary junction) and right upper and lower extremity weakness. PMH is significant for hypertension, combined heart failure, tobacco use and prediabetes.  Switch from aspirin to Plavix.  Discharged home with follow-up with PCP.  Recommended for outpatient physical therapy.   Issues for Follow Up:  1. Follow-up BP  2. Able to afford Plavix? 3. Prediabetes  Significant Procedures: None  Significant Labs and Imaging:   Recent Labs Lab 03/21/15 1637 03/21/15 1639  WBC 11.1*  --   HGB 17.6* 18.7*  HCT 49.9 55.0*  PLT 187  --     Recent Labs Lab 03/21/15 1637 03/21/15 1639  NA 139 140  K 3.8 3.7  CL 106 103  CO2 23  --   GLUCOSE 118* 125*  BUN 11 13  CREATININE 0.81 0.80  CALCIUM 9.5  --   ALKPHOS 116  --   AST 13*  --   ALT 17  --   ALBUMIN 3.9  --    MRI HEAD: Subcentimeter focus of acute ischemia within LEFT ventral pontomedullary junction.  Results/Tests Pending at Time of Discharge:  None  Discharge Medications:    Medication List    TAKE these medications        acyclovir 200 MG capsule  Commonly known as:  ZOVIRAX  Take 1 capsule (200 mg total) by mouth every morning.     amitriptyline 50 MG tablet  Commonly known as:  ELAVIL  TAKE THREE TABLETS BY MOUTH AT BEDTIME.     aspirin 81 MG EC tablet  Take 1 tablet (81 mg total) by mouth daily.     atorvastatin 40 MG tablet  Commonly known as:  LIPITOR  Take 1 tablet (40 mg total) by mouth daily at 6 PM.     clopidogrel 75 MG tablet  Commonly known as:  PLAVIX  Take 1 tablet (75 mg total) by mouth daily.     gabapentin 600 MG tablet  Commonly known as:  NEURONTIN  Take 600-1,200 mg by mouth daily as needed (leg pain).     lisinopril 10 MG tablet  Commonly known as:  PRINIVIL,ZESTRIL  Take 1 tablet (10 mg total) by mouth daily.     metoprolol succinate 25 MG 24 hr tablet  Commonly known as:  TOPROL-XL  Take 1 tablet (25 mg total) by mouth daily.     naphazoline-pheniramine 0.025-0.3 % ophthalmic solution  Commonly known as:  NAPHCON-A  Place 1 drop into both eyes daily as needed for irritation.     NEXIUM PO  Take 1 capsule by mouth daily as  needed (heartburn).     nicotine 21 mg/24hr patch  Commonly known as:  NICODERM CQ - dosed in mg/24 hours  Place 1 patch (21 mg total) onto the skin once.     nicotine polacrilex 2 MG gum  Commonly known as:  NICORETTE  Take 1 each (2 mg total) by mouth as needed for smoking cessation.       Discharge Instructions: Please refer to Patient Instructions section of EMR for full details.  Patient was counseled important signs and symptoms that should prompt return to medical care, changes in medications, dietary instructions, activity restrictions, and follow up appointments.   Follow-Up Appointments: Follow-up Information    Follow up with Uvaldo Rising, MD On 04/03/2015.   Specialty:  Family Medicine   Why:  Apt at 8:30   Contact information:   [address] [place] [address] [phone]       Follow up with [facility] On 03/26/2015.   Specialty:  Family Medicine   Why:  Appointment with Lab @ 8:30am for blood work   Contact information:   [address] [id number] [place] [address] [phone]      Follow up with Xu,Jindong, MD. Schedule an appointment as soon as possible for a visit in 2 months.   Specialty:  Neurology   Why:  Please call to make an appointment with Neurology   Contact information:   [address] [place] [address] [phone]       Follow-up Information    Follow up with Uvaldo Rising, MD On 04/03/2015.   Specialty:  Family Medicine   Why:  Apt at 8:30   Contact information:   [address] [place] [address] [phone]       Follow up with [facility] On 03/26/2015.   Specialty:  Family Medicine   Why:  Appointment with Lab @ 8:30am for blood work   Contact information:   [address] [id number] [place] [address] [phone]      Follow up with Xu,Jindong, MD. Schedule an appointment as soon as possible for a visit in 2 months.   Specialty:  Neurology   Why:  Please call to make an appointment with Neurology   Contact information:   [address] [place] [address] [phone]      Jamal Collin, MD 03/26/2015, 1:44 PM PGY-3, [facility]

## 2015-03-22 NOTE — Progress Notes (Signed)
Nutrition Brief Note  Patient identified on the Malnutrition Screening Tool (MST) Report  Wt Readings from Last 15 Encounters:  03/21/15 192 lb 8 oz (87.317 kg)  03/19/15 220 lb (99.791 kg)  05/24/13 222 lb (100.699 kg)  02/06/13 220 lb (99.791 kg)   Aaron Kennedy is a 44 y.o. male presenting with stroke. PMH is significant for hypertension, combined heart failure, tobacco use and prediabetes.   Pt admitted with acute infarction at lt ventral pontomedullary region. Neuro following for completion stroke work-up.   Spoke with pt bedside. He reports good appetite presently and PTA. He reveals "I love to eat" and consumed all of his breakfast this morning. He denies any difficulty chewing or swallowing foods and liquids.   Pt reports he "lost a few pounds", but contributesthis to being more active. He recently started playing basketball recreationally. He expressed appreciation for visit, but denied further nutritional needs.   Body mass index is 26.1 kg/(m^2). Patient meets criteria for overweightcurrent BMI.   Current diet order is Heart Healthy, patient is consuming approximately 100% of meals at this time. Labs and medications reviewed.   No nutrition interventions warranted at this time. If nutrition issues arise, please consult RD.   Shad Ledvina A. Mayford Knife, RD, LDN, CDE Pager: [phone] After hours Pager: [phone]

## 2015-03-22 NOTE — Progress Notes (Signed)
PT Cancellation Note  Patient Details Name: Aaron Kennedy MRN: [id number] DOB: Aug 09, 1970   Cancelled Treatment:    Reason Eval/Treat Not Completed: Patient not medically ready. Pt with order for bedrest. Please update activity order, when appropriate, for PT to proceed with eval.   Ilda Foil 03/22/2015, 8:40 AM

## 2015-03-22 NOTE — Progress Notes (Signed)
STROKE TEAM PROGRESS NOTE   HISTORY Aaron Kennedy is an 44 y.o. male with a past medical history significant for HTN, HLD, and smoking, admitted to [facility] due to right sided weakness. Aaron Kennedy said that this past Tuesday he woke up around 3 am to go to work and noticed " a heavy, numb feeling in the right arm and leg and I couldn't use my right arm". He did not seek immediate medical attention, but the weakness worsened and he presented to [facility] where he was admitted to the hospital on 11/30 where his symptoms improved, had [name] work up that was significant only for LDL 140 and hemoglobin A1c 6.0, and thus was discharged home on BP medication, ASA and statin. He indicated that he did improve substantially after discharge but never was 100% back to baseline and yesterday morning woke up again with weakness of his right arm and leg, similar to what he had before. Denies associated vertigo, double vision, difficulty swallowing, slurred speech, imbalance, language or vision impairment. MRI/MRA brain were personally reviewed and showed an acute infarction at the left ventral pontomedullary region, with MRA that was unrevealing.   Date last known well: 03/17/15 Time last known well: unable to determine tPA Given: no, late presentation   SUBJECTIVE (INTERVAL HISTORY) No family members present. The patient is anxious for discharge. He states he intends to quit smoking. He will follow-up in the clinic with Dr. Roda Shutters in approximately 2 months. Still has right hand dexterity difficulty but otherwise stable.   OBJECTIVE Temp:  [97.6 F (36.4 C)-98.2 F (36.8 C)] 98.1 F (36.7 C) (12/03 0600) Pulse Rate:  [57-79] 62 (12/03 0400) Cardiac Rhythm:  [-] Normal sinus rhythm;Bundle branch block (12/03 0700) Resp:  [11-20] 16 (12/03 0600) BP: (116-164)/(82-101) 144/83 mmHg (12/03 0600) SpO2:  [83 %-99 %] 94 % (12/03 0600) Weight:  [87.317 kg (192 lb 8 oz)] 87.317 kg (192 lb 8 oz) (12/02 2227)  CBC:   Recent Labs Lab 03/18/15 1014  03/19/15 0546 03/21/15 1637 03/21/15 1639  WBC 8.2  --  7.8 11.1*  --   NEUTROABS 4.6  --   --  6.4  --   HGB 16.1  < > 16.1 17.6* 18.7*  HCT 46.1  < > 46.6 49.9 55.0*  MCV 95.1  --  95.7 94.0  --   PLT 182  --  165 187  --   < > = values in this interval not displayed.  Basic Metabolic Panel:  Recent Labs Lab 03/19/15 0546 03/21/15 1637 03/21/15 1639  NA 140 139 140  K 4.1 3.8 3.7  CL 108 106 103  CO2 25 23  --   GLUCOSE 86 118* 125*  BUN CREATININE 0.98 0.81 0.80  CALCIUM 8.6* 9.5  --     Lipid Panel:    Component Value Date/Time   CHOL 189 03/19/2015 0546   TRIG 127 03/19/2015 0546   HDL 24* 03/19/2015 0546   CHOLHDL 7.9 03/19/2015 0546   VLDL 25 03/19/2015 0546   LDLCALC 140* 03/19/2015 0546   HgbA1c:  Lab Results  Component Value Date   HGBA1C 6.0* 03/19/2015   Urine Drug Screen:    Component Value Date/Time   LABOPIA NONE DETECTED 03/18/2015 1113   COCAINSCRNUR NONE DETECTED 03/18/2015 1113   LABBENZ NONE DETECTED 03/18/2015 1113   AMPHETMU NONE DETECTED 03/18/2015 1113   THCU POSITIVE* 03/18/2015 1113   LABBARB NONE DETECTED 03/18/2015 1113  IMAGING  Ct Head Wo Contrast 03/21/2015   Negative exam.  No acute intracranial findings are evident.    Mr C Spine W Wo Contrast 03/21/2015   No MR findings of demyelination within the cervical spine. No acute fracture or malalignment. Mild canal stenosis C3-4, minimal at C6-7. Moderate neural foraminal narrowing C3-4 thru C6-7.    MRI HEAD:  Subcentimeter focus of acute ischemia within LEFT ventral pontomedullary junction. Moderate white matter changes advanced for age predominantly representing chronic small vessel ischemic disease though, suspected component chronic demyelination without acute inflammation. No parenchymal brain volume loss for age.    Mr Maxine Glenn Head Wo Contrast 03/21/2015   No flow-limiting stenosis, vertebral dissection, or LEFT PICA  thrombosis.   CUS - Bilateral: 1-39% ICA stenosis. Vertebral artery flow is antegrade.  TTE - - Left ventricle: The cavity size was normal. Wall thickness was normal. Systolic function was mildly reduced. The estimated ejection fraction was in the range of 45% to 50%. Features are consistent with a pseudonormal left ventricular filling pattern, with concomitant abnormal relaxation and increased filling pressure (grade 2 diastolic dysfunction). - Right atrium: The atrium was mildly dilated.   PHYSICAL EXAM  Physical exam  Temp:  [98.3 F (36.8 C)-98.6 F (37 C)] 98.3 F (36.8 C) (12/03 1415) Pulse Rate:  [74] 74 (12/03 1415) Resp:  [18] 18 (12/03 1415) BP: (127-163)/(80-106) 163/106 mmHg (12/03 1415) SpO2:  [95 %-100 %] 100 % (12/03 1415)  General - Well nourished, well developed, in no apparent distress.  Ophthalmologic - Sharp disc margins OU.   Cardiovascular - Regular rate and rhythm.  Mental Status -  Level of arousal and orientation to time, place, and person were intact. Language including expression, naming, repetition, comprehension was assessed and found intact. Fund of Knowledge was assessed and was intact.  Cranial Nerves II - XII - II - Visual field intact OU. III, IV, VI - Extraocular movements intact. V - Facial sensation intact bilaterally. VII - right nasolabial fold flattening. VIII - Hearing & vestibular intact bilaterally. X - Palate elevates symmetrically. XI - Chin turning & shoulder shrug intact bilaterally. XII - Tongue protrusion intact.  Motor Strength - The patient's strength was normal in all extremities except right hand dexterity difficulty and pronator drift was absent.  Bulk was normal and fasciculations were absent.   Motor Tone - Muscle tone was assessed at the neck and appendages and was normal.  Reflexes - The patient's reflexes were 1+ in all extremities and he had no pathological reflexes.  Sensory - Light touch,  temperature/pinprick were assessed and were symmetrical.    Coordination - The patient had normal movements in the hands and feet with no ataxia or dysmetria.  Tremor was absent.  Gait and Station - deferred due to safety concerns    ASSESSMENT/PLAN Aaron Kennedy is a 44 y.o. male with history of hypertension, hyperlipidemia, tobacco history, and recent [name],  presenting with right hemiparesis. He did not receive IV t-PA due to late presentation.  Stroke:  Left pontomedullary infarct, secondary to small vessel disease.  Resultant  Right hand dexterity difficulty  MRI  Subcentimeter focus of acute ischemia within LEFT ventral pontomedullary junction.  MRA  unremarkable  Carotid Doppler unremarkable.  2D Echo - EF 45-50%. No cardiac source of emboli identified.  LDL 140  HgbA1c - 6.0  VTE prophylaxis - Lovenox  Diet Heart Room service appropriate?: Yes; Fluid consistency:: Thin  aspirin 81 mg daily prior to admission, now  on clopidogrel 75 mg daily. Continue plavix on discharge.  Patient counseled to be compliant with his antithrombotic medications  Ongoing aggressive stroke risk factor management  Therapy recommendations: No follow-up physical therapy recommended.  Disposition: Possible discharge today.  Hypertension  Stable  Permissive hypertension (OK if < 220/120) but gradually normalize in 5-7 days  Hyperlipidemia  Home meds:  Lipitor 40 mg daily resumed in hospital. (Recently started)  LDL 140, goal < 70  Continue statin at discharge  Tobacco abuse  Current smoker  Smoking cessation counseling provided  Nicotine patch provided  Pt is willing to quit  Other Stroke Risk Factors  ETOH use  Family hx stroke (brother)  Other Active Problems  UDS - positive for [facility]  Hospital day # 1  Neurology will sign off. Please call with questions. Pt will [facility] with [facility] about 2 months. Thanks for the consult.  Marvel Plan, MD  PhD Stroke Neurology 03/23/2015 6:52 AM   To contact Stroke Continuity provider, please refer to [email or web address]. After hours, contact General Neurology

## 2015-03-22 NOTE — Discharge Instructions (Signed)
You're admitted to the hospital for stroke that caused your right arm and leg weakness.  You should continue to take your Plavix, Lipitor, and blood pressure medications as prescribed.  Your scheduled follow-up with family medicine clinic on 12/7 for blood work at Dr. Randolm Idol on 12/15.   Ischemic Stroke Treated Without Warfarin An ischemic stroke is the sudden death of brain tissue. Blood carries oxygen to all areas of your body. This type of stroke happens when your blood does not flow to your brain like normal. Your brain cannot get the oxygen it needs. This is an emergency. Symptoms of any stroke usually happen all of the sudden. You may notice them when you wake up. They can include sudden:  Weakness or loss of feeling in your face, arm, or leg, especially on one side of your body.  Confusion.  Trouble talking or understanding.  Trouble seeing.  Trouble walking or moving your arms or legs.  Dizziness.  Loss of balance or coordination.  Severe headache with no known cause. Get help as soon as any of these problems start. This is important so your doctor can treat you right away with:   Medicines that dissolve blood clots.  A device to remove a blood clot. A few hours after the start of your symptoms, your doctor may treat you with:  Oxygen.  Medicines.  A surgery to widen blood vessels. RISK FACTORS  Risk factors are things that make it more likely for you to have a stroke. These include:  High blood pressure.  High cholesterol.  Diabetes.  Heart disease.  Having a buildup of fatty deposits in the blood vessels.  Having an abnormal heart rhythm (atrial fibrillation).  Being very overweight (obese).  Smoking.  Taking birth control pills, especially if you smoke.  Not being active.  Having a diet that is high in fats, salt, and calories.  Drinking too much alcohol.  Using illegal drugs.  Being African American.  Being over the age of 33.  Having a  family history of stroke.  Having a history of blood clots, stroke, warning stroke (transient ischemic attack, [name]), or heart attack.  Sickle cell disease. HOME CARE  Take all medicines exactly as told by your doctor. Understand all your medicine instructions.  If you are able to swallow, eat healthy foods. Eat 5 or more servings of fruits and vegetables each day. Eat soft foods or pureed foods, or eat small bites of food so you do not choke.  Stay active. Try to get at least 30 minutes of activity on most or all days.  Do not smoke.  Limit or stop alcohol use.  Keep your home safe so you do not fall. Try:  Putting grab bars in the bedroom and bathroom.  Raising toilet seats.  Putting a seat in the shower.  Go to physical, occupational, and speech therapy sessions as told by your doctor.  Use a walker or a cane at all times if told to do so.  Keep all doctor visits as told. GET HELP RIGHT AWAY IF:   You have sudden weakness or loss of feeling in your face, arm, or leg, especially on one side of your body.  You are suddenly confused.  You have trouble talking or understanding.  You have sudden trouble seeing.  You have sudden trouble walking or moving your arms or legs.  You have sudden dizziness.  You lose your balance or your movements are not smooth.  You have a sudden,  severe headache with no known cause.  You are partly unaware or totally unaware of what is going on around you. Any of these symptoms may be a sign of an emergency. Do not wait to see if the symptoms go away. Call for help (911 in [place] Do not drive yourself to the hospital.   This information is not intended to replace advice given to you by your health care provider. Make sure you discuss any questions you have with your health care provider.   Document Released: 01/18/2014 Document Reviewed: 01/18/2014 Elsevier Interactive Patient Education [employer]

## 2015-03-22 NOTE — Progress Notes (Signed)
Family Medicine Teaching Service Daily Progress Note Intern Pager: [phone]  Patient name: Aaron Kennedy Medical record number: [id number] Date of birth: June 30, 1970 Age: 44 y.o. Gender: male  Primary Care Provider: Pcp Not In System Consultants: Neuro Code Status: Full  Pt Overview and Major Events to Date:  12/2: Admit for CVA; holding BP meds for permissive hypertension 12/3: Likely discharged today pending PT, OT and neuro recs  Assessment and Plan: Aaron Kennedy is a 44 y.o. male presenting with stroke. PMH is significant for hypertension, combined heart failure, tobacco use and prediabetes.   CVA: Right sided numbness & weakness. Recently discharged 03/19/15 for [name] with similar symptoms that resolved during hospitalization. Was started on ASA 81 mg daily and Lipitor 40 mg daily at bedtime during last hospitalization. Carotid Duplex (03/19/15) showed 1-39% internal carotid artery stenosis bilaterally. Vertebral arteries are patent with antegrade flow. MRI 03/21/15 showed: acute ischemia within LEFT ventral pontomedullary junction. Multiple family members with CVA or MI in their 68s.  - Admit to Tele - Plavix qd, Lipitor  qhs - Allow for permissive HTN as below - PT/OT/SLP consulted - Neuro consulted: Awaiting recs - likely discharge today given completed stroke work-up at last hospitalization 3 days ago  Card: HTN, HLD (LDL 140 on 03/19/15), Newly Diagnosed Combined HF (ECHO 03/19/15 showed EF 45-50%, G1DD) - Holding Lisinopril  daily restart prn  - Restart Metoprolol XL  daily  Prediabetes: A1c 6 (03/19/59) - Consider starting metformin  Tobacco Use: almost 1ppd ~43 years. UDS positive for marijuana last hospitalization - Nicotine patch . - Nicorete gum   Right Ankle Pain: Notes of history of injury about 1 year ago, but was not evaluated. X-ray corticated ossific fragments adjacent to the medial malleolus likely reflecting sequela of prior avulsive  injury, but no acute injury noted - Holding Gabapentin  - Tylenol PRN  FEN/GI: heart diet; SLIV Prophylaxis: Lovenox SQ  Disposition: Likely home today  Subjective:  Continues to report some right upper and lower extremity weakness but otherwise denies any complaints.  Continues to deny chest pain, shortness breath, palpitations.   Objective: Temp:  [97.6 F (36.4 C)-98.6 F (37 C)] 98.6 F (37 C) (12/03 0800) Pulse Rate:  [57-79] 62 (12/03 0400) Resp:  [11-20] 16 (12/03 0600) BP: (116-164)/(80-101) 162/90 mmHg (12/03 0943) SpO2:  [83 %-99 %] 99 % (12/03 0943) Weight:  [192 lb 8 oz (87.317 kg)] 192 lb 8 oz (87.317 kg) (12/02 2227)  Physical Exam: General: NAD Eyes: PERRLA ENTM: MM Cardiovascular: RRR no m/r/g Respiratory: CTAB, normal effort Neuro: A&Ox3; PERRLA, EOMI, CN-3-12 intact. Right upper and lower strength 4/5.  Positive pronator drift on right side  Laboratory:  Recent Labs Lab 03/18/15 1014  03/19/15 0546 03/21/15 1637 03/21/15 1639  WBC 8.2  --  7.8 11.1*  --   HGB 16.1  < > 16.1 17.6* 18.7*  HCT 46.1  < > 46.6 49.9 55.0*  PLT 182  --  165 187  --   < > = values in this interval not displayed.  Recent Labs Lab 03/18/15 1014  03/19/15 0546 03/21/15 1637 03/21/15 1639  NA 139  < > 140 139 140  K 4.2  < > 4.1 3.8 3.7  CL 110  < > 108 106 103  CO2 24  --  25 23  --   BUN 7  < > CREATININE 0.83  < > 0.98 0.81 0.80  CALCIUM 8.6*  --  8.6* 9.5  --  PROT 6.1*  --   --  7.5  --   BILITOT 0.6  --   --  1.2  --   ALKPHOS 103  --   --  116  --   ALT 19  --   --  17  --   AST 13*  --   --  13*  --   GLUCOSE 94  < > 86 118* 125*  < > = values in this interval not displayed.   Recent Labs Lab 03/18/15 1014 03/21/15 1637  INR 1.08 1.07   Troponin (Point of Care Test)  Recent Labs  03/21/15 1640  TROPIPOC 0.00   Imaging/Diagnostic Tests: Ct Head Wo Contrast  03/21/2015 CLINICAL DATA: [name] and hypertension. Recent admission.  Patient now unable to move RIGHT arm. EXAM: CT HEAD WITHOUT CONTRAST TECHNIQUE: Contiguous axial images were obtained from the base of the skull through the vertex without intravenous contrast. COMPARISON: 03/18/2015. FINDINGS: No evidence for acute infarction, hemorrhage, mass lesion, hydrocephalus, or extra-axial fluid. Normal for age cerebral volume. Patchy hypoattenuation of the white matter confirmed to represent small vessel disease on prior MR. Incidental mega cisterna magna. Calvarium intact. Chronic sinus disease. Negative orbits. IMPRESSION: Negative exam. No acute intracranial findings are evident. Electronically Signed By: Elsie Stain M.D. On: 03/21/2015 17:42   Mr Maxine Glenn Head Wo Contrast  03/21/2015 CLINICAL DATA: RIGHT arm and RIGHT leg weakness which began 03/18/2015. Abnormal brain MRI. EXAM: MRA HEAD WITHOUT CONTRAST TECHNIQUE: Angiographic images of the Circle of Willis were obtained using MRA technique without intravenous contrast. COMPARISON: MRI brain 03/18/2015. CT head 03/18/2015. CT head 03/21/2015. FINDINGS: The internal carotid arteries are widely patent. The basilar artery is widely patent with vertebrals codominant. No intracranial stenosis or aneurysm. Mild non stenotic irregularity of the proximal LEFT A1 and mid M1 segments of the anterior and middle cerebral arteries respectively. No PCA disease. No cerebellar branch occlusion. No MCA branch occlusion. Specific attention was directed to the LEFT vertebral and LEFT PICA which show no evidence for dissection or thrombus. IMPRESSION: No flow-limiting stenosis, vertebral dissection, or LEFT PICA thrombosis. Electronically Signed By: Elsie Stain M.D. On: 03/21/2015 20:05   Mr Laqueta Jean TG Contrast  03/21/2015 ADDENDUM REPORT: 03/21/2015 23:00 ADDENDUM: Correction: IMPRESSION: MR CERVICAL SPINE (not mra head): No MR findings of demyelination within the cervical spine. Electronically Signed By: Awilda Metro M.D.  On: 03/21/2015 23:00  IMPRESSION: MRI HEAD: Subcentimeter focus of acute ischemia within LEFT ventral pontomedullary junction. Moderate white matter changes advanced for age predominantly representing chronic small vessel ischemic disease though, suspected component chronic demyelination without acute inflammation. No parenchymal brain volume loss for age. MRA HEAD: No MR findings of demyelination within the cervical spine. No acute fracture or malalignment. Mild canal stenosis C3-4, minimal at C6-7. Moderate neural foraminal narrowing C3-4 thru C6-7. Electronically Signed: By: Awilda Metro M.D. On: 03/21/2015 22:46   Jamal Collin, MD 03/22/2015, 10:59 AM PGY-3, [facility] FPTS Intern pager: [phone], text pages welcome

## 2015-03-26 ENCOUNTER — Other Ambulatory Visit: Payer: Self-pay

## 2015-03-26 DIAGNOSIS — I10 Essential (primary) hypertension: Secondary | ICD-10-CM

## 2015-03-26 LAB — BASIC METABOLIC PANEL
BUN: 10 mg/dL (ref 7–25)
CALCIUM: 8.6 mg/dL (ref 8.6–10.3)
CO2: 25 mmol/L (ref 20–31)
CREATININE: 0.82 mg/dL (ref 0.60–1.35)
Chloride: 101 mmol/L (ref 98–110)
Glucose, Bld: 116 mg/dL — ABNORMAL HIGH (ref 65–99)
POTASSIUM: 3.9 mmol/L (ref 3.5–5.3)
SODIUM: 139 mmol/L (ref 135–146)

## 2015-03-26 NOTE — Progress Notes (Signed)
Bmp done today Ardell Makarewicz 

## 2015-03-27 ENCOUNTER — Encounter: Payer: Self-pay | Admitting: Family Medicine

## 2015-04-03 ENCOUNTER — Ambulatory Visit (INDEPENDENT_AMBULATORY_CARE_PROVIDER_SITE_OTHER): Payer: Self-pay | Admitting: Family Medicine

## 2015-04-03 ENCOUNTER — Encounter: Payer: Self-pay | Admitting: Family Medicine

## 2015-04-03 VITALS — BP 150/91 | HR 81 | Temp 97.9°F | Ht 73.0 in | Wt 202.5 lb

## 2015-04-03 DIAGNOSIS — I5189 Other ill-defined heart diseases: Secondary | ICD-10-CM

## 2015-04-03 DIAGNOSIS — F17200 Nicotine dependence, unspecified, uncomplicated: Secondary | ICD-10-CM

## 2015-04-03 DIAGNOSIS — I6330 Cerebral infarction due to thrombosis of unspecified cerebral artery: Secondary | ICD-10-CM

## 2015-04-03 DIAGNOSIS — I639 Cerebral infarction, unspecified: Secondary | ICD-10-CM | POA: Insufficient documentation

## 2015-04-03 DIAGNOSIS — I10 Essential (primary) hypertension: Secondary | ICD-10-CM

## 2015-04-03 DIAGNOSIS — R569 Unspecified convulsions: Secondary | ICD-10-CM | POA: Insufficient documentation

## 2015-04-03 NOTE — Progress Notes (Signed)
   Subjective:    Patient ID: Aaron Kennedy, male    DOB: 15-Dec-1970, 44 y.o.   MRN: [id number]  HPI 44 y/o male presents for new patient visit/hospital follow up.   Admitted to [facility] 11/29 - 11/30 for acute weakness of right UE and LE and slurred speech thought to be [name]. Found to have new diagnosis of combined systolic and diastolic heart failure. He was readmitted on 12/2-12/3 with worsening neurologic symptoms and found to have CVA (see MRI results below).   CVA - slow improvement every day, still some "tightness", currently at 70% improved, no further changes in speech; right leg still has some numbness  History of Seizures - on Amitryptyline per Neurology in [facility]  HTN - currently on Metoprolol and Lisinopril, BP at home 139/82 last night, no chest pain  Combined Diastolic and Systolic Heart Failure - no swelling, no sob, no chest pain  Social - tobacco user, down to 1-2 cigarettes per day, using nicotine patches intermittently  Prediabetes - A1C 6.0 while hospitalized, not on diabetes medications   Review of Systems  Constitutional: Negative for fever and fatigue.  Respiratory: Negative for cough and shortness of breath.   Cardiovascular: Negative for chest pain.  Gastrointestinal: Negative for nausea, vomiting and diarrhea.       Objective:   Physical Exam Vitals: Reviewed Gen.: Pleasant male, no acute distress HEENT: Normocephalic, pupils equal round reactive to light, extraocular intact, no scleral icterus, moist mucous membranes, uvula midline, neck was supple Cardiac: Regular rate and rhythm, S1 and S2 present, no murmurs, no heaves or thrills Respiratory: Clear to patient bilaterally, normal effort Neuro: Cranial nerves II through XII intact, strength grossly 5/5 in all extremities, decreased sensation to light touch in the right upper extremity, 2+ patellar and biceps reflexes bilaterally, normal gait   MRI/MRA 12/2 - subcentimeter focus of  acute ischemia within the left ventral pontomedullary function.     Assessment & Plan:  CVA (cerebral vascular accident) [facility]) Hospital Follow up for CVA on 03/21/15. Symptoms gradually improving. -continue Plavix and Lipitor -patient encouraged to follow up with his Neurologist in [facility] -provided letter to return to work (light duty).   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 Likely due to history of uncontrolled HTN. -Continue Plavix/Toprol/Lisinopril -referral to cardiology for ischemic workup  HTN (hypertension) Blood pressure elevated in office today (under better control at home). -no changes in Lisinopril 10 mg daily and Toprol 25 mg daily -continue to check BP at home, if remain elevated will increase Lisinopril -Patient applying for Medicaid, when he has insurance will check BMP to monitor renal function on ACE-I  Tobacco use disorder Patient down to 1-2 cigarettes per day. -Quit date set for 04/20/15  Seizures (HCC) No recent Seizures. Previously seen by Neurology in [facility]. -record request sent to previous Neurologist -patient to continue Amitryptyline daily

## 2015-04-03 NOTE — Patient Instructions (Signed)
It was great to see you today.   Stroke - you are doing great, continue Plavix and Lipitor daily; please follow up with Dr. Lenoria Farrier (in [facility]).   Smoking - contradulations on cutting down, please set a quit date for the 1st of the year  Blood pressure - slightly elevated today, please continue to check blood pressures at home and call values into the office in 2 weeks; please call the office when you have Medicaid so that we can check some lab work  Heart Failure - continue Metoprolol and Lisinorpril, you have been referred to the heart doctor (my office will call you to schedule an appointment).

## 2015-04-03 NOTE — Assessment & Plan Note (Signed)
Hospital Follow up for CVA on 03/21/15. Symptoms gradually improving. -continue Plavix and Lipitor -patient encouraged to follow up with his Neurologist in [facility] -provided letter to return to work (light duty).

## 2015-04-03 NOTE — Assessment & Plan Note (Signed)
Combined Systolic and Diastolic Heart Failure. Likely due to history of uncontrolled HTN. -Continue Plavix/Toprol/Lisinopril -referral to cardiology for ischemic workup

## 2015-04-03 NOTE — Assessment & Plan Note (Signed)
Patient down to 1-2 cigarettes per day. -Quit date set for 04/20/15

## 2015-04-03 NOTE — Assessment & Plan Note (Signed)
No recent Seizures. Previously seen by Neurology in [facility]. -record request sent to previous Neurologist -patient to continue Amitryptyline daily

## 2015-04-03 NOTE — Assessment & Plan Note (Signed)
Blood pressure elevated in office today (under better control at home). -no changes in Lisinopril 10 mg daily and Toprol 25 mg daily -continue to check BP at home, if remain elevated will increase Lisinopril -Patient applying for Medicaid, when he has insurance will check BMP to monitor renal function on ACE-I

## 2015-04-04 ENCOUNTER — Other Ambulatory Visit: Payer: Self-pay

## 2015-04-04 NOTE — Patient Outreach (Signed)
[facility]) Care Management  04/04/2015  Aaron Kennedy [birth year] [id number]  SUBJECTIVE:  Telephone call to patient regarding EMMI stroke program outreach. HIPAA verified with patient. Patient states he is recovering well.  Patient reports his strength on his right side continues to improve daily. Patient states he still has tome tingling in his toes and fingers on the right side. Patient states his right knee catches  Sometimes when he is walking.  Patient states he followed up with his primary care provider on yesterday 04/03/15.  Patient states he primary MD stated he was doing well. Patient states he has all of his medications and is able to afford them at this time. Patient states he has not scheduled a follow appointment with neurologist. RNCM advised patient to schedule follow up appointment with neurologist. Patient states has a home blood pressure monitor.  States he is concerned whether or not his monitor is checking his blood pressures accurately.   RNCM advised patient to take his blood pressure monitor to his next doctor appointment to have calibrated. Patient verbalized understanding.  Patient states he continues to work on decreasing his smoking. RNCM offered [facility] care management smoking cessation follow up by pharmacist. Patient refused at this time. Patient states he is going to try to stop smoking on his own.  RNCM advised patient to keep schedule appointments with doctor, take medications as prescribed, report any unusual symptoms to doctor and / or call 911 for any stroke symptoms.  RNCM advised to continue to monitor and record blood pressures daily and adhere to low salt diet.  RNCM discussed with patient EMMI education material regarding stroke recovery/ stroke symptoms and low salt diet. Patient verbalized agreement to receive EMMI educations material.  Patient verbalized agreement to next telephone outreach with [facility].   ASSESSMENT: EMMI stroke transition  program: RED referral ( Stroke coping)  PLAN: RNCM will send patient EMMI education material regarding stroke and low salt diet.  RNCM will follow up with patient within 1 week.   [name] RN,BSN,CCM [facility] Telephonic Care Coordinator [phone]

## 2015-04-04 NOTE — Patient Outreach (Signed)
[facility]) Care Management  04/04/2015  Aaron Kennedy [birth year] [id number]  Telephone call to patient regarding EMMI stroke program referral.  Unable to reach patient. HIPAA compliant voice message left with call back phone number.   PLAN: RNCM will attempt 2nd telephone outreach to patient within 1 week.   [name] RN,BSN,CCM [facility] Telephonic Care Coordinator [phone]

## 2015-04-07 ENCOUNTER — Encounter (HOSPITAL_COMMUNITY): Payer: Self-pay | Admitting: Emergency Medicine

## 2015-04-07 ENCOUNTER — Emergency Department (HOSPITAL_COMMUNITY): Payer: Self-pay

## 2015-04-07 ENCOUNTER — Inpatient Hospital Stay (HOSPITAL_COMMUNITY)
Admission: EM | Admit: 2015-04-07 | Discharge: 2015-04-08 | DRG: 093 | Disposition: A | Discharge status: Home / Self Care | Payer: Self-pay | Attending: Family Medicine | Admitting: Family Medicine

## 2015-04-07 ENCOUNTER — Ambulatory Visit: Payer: Self-pay

## 2015-04-07 ENCOUNTER — Encounter: Payer: Self-pay | Admitting: Family Medicine

## 2015-04-07 DIAGNOSIS — Z823 Family history of stroke: Secondary | ICD-10-CM

## 2015-04-07 DIAGNOSIS — R202 Paresthesia of skin: Principal | ICD-10-CM | POA: Diagnosis present

## 2015-04-07 DIAGNOSIS — F17200 Nicotine dependence, unspecified, uncomplicated: Secondary | ICD-10-CM | POA: Diagnosis present

## 2015-04-07 DIAGNOSIS — I10 Essential (primary) hypertension: Secondary | ICD-10-CM | POA: Diagnosis present

## 2015-04-07 DIAGNOSIS — R569 Unspecified convulsions: Secondary | ICD-10-CM

## 2015-04-07 DIAGNOSIS — Z79899 Other long term (current) drug therapy: Secondary | ICD-10-CM

## 2015-04-07 DIAGNOSIS — I6330 Cerebral infarction due to thrombosis of unspecified cerebral artery: Secondary | ICD-10-CM

## 2015-04-07 DIAGNOSIS — I639 Cerebral infarction, unspecified: Secondary | ICD-10-CM | POA: Diagnosis present

## 2015-04-07 DIAGNOSIS — R079 Chest pain, unspecified: Secondary | ICD-10-CM

## 2015-04-07 DIAGNOSIS — R200 Anesthesia of skin: Secondary | ICD-10-CM

## 2015-04-07 DIAGNOSIS — F1290 Cannabis use, unspecified, uncomplicated: Secondary | ICD-10-CM | POA: Diagnosis present

## 2015-04-07 DIAGNOSIS — Z8673 Personal history of transient ischemic attack (TIA), and cerebral infarction without residual deficits: Secondary | ICD-10-CM

## 2015-04-07 DIAGNOSIS — Z7902 Long term (current) use of antithrombotics/antiplatelets: Secondary | ICD-10-CM

## 2015-04-07 DIAGNOSIS — R7303 Prediabetes: Secondary | ICD-10-CM | POA: Diagnosis present

## 2015-04-07 DIAGNOSIS — R209 Unspecified disturbances of skin sensation: Secondary | ICD-10-CM

## 2015-04-07 DIAGNOSIS — E785 Hyperlipidemia, unspecified: Secondary | ICD-10-CM | POA: Diagnosis present

## 2015-04-07 LAB — DIFFERENTIAL
BASOS PCT: 0 %
Basophils Absolute: 0 10*3/uL (ref 0.0–0.1)
EOS ABS: 0.2 10*3/uL (ref 0.0–0.7)
EOS PCT: 3 %
Lymphocytes Relative: 31 %
Lymphs Abs: 2.6 10*3/uL (ref 0.7–4.0)
MONO ABS: 0.7 10*3/uL (ref 0.1–1.0)
Monocytes Relative: 8 %
Neutro Abs: 4.9 10*3/uL (ref 1.7–7.7)
Neutrophils Relative %: 58 %

## 2015-04-07 LAB — CBC
HCT: 43.5 % (ref 39.0–52.0)
Hemoglobin: 15.2 g/dL (ref 13.0–17.0)
MCH: 33.2 pg (ref 26.0–34.0)
MCHC: 34.9 g/dL (ref 30.0–36.0)
MCV: 95 fL (ref 78.0–100.0)
PLATELETS: 197 10*3/uL (ref 150–400)
RBC: 4.58 MIL/uL (ref 4.22–5.81)
RDW: 12.1 % (ref 11.5–15.5)
WBC: 8.5 10*3/uL (ref 4.0–10.5)

## 2015-04-07 LAB — I-STAT CHEM 8, ED
BUN: 12 mg/dL (ref 6–20)
CALCIUM ION: 1.09 mmol/L — AB (ref 1.12–1.23)
CHLORIDE: 101 mmol/L (ref 101–111)
CREATININE: 1 mg/dL (ref 0.61–1.24)
GLUCOSE: 112 mg/dL — AB (ref 65–99)
HCT: 48 % (ref 39.0–52.0)
Hemoglobin: 16.3 g/dL (ref 13.0–17.0)
Potassium: 3.8 mmol/L (ref 3.5–5.1)
SODIUM: 138 mmol/L (ref 135–145)
TCO2: 26 mmol/L (ref 0–100)

## 2015-04-07 LAB — I-STAT TROPONIN, ED: Troponin i, poc: 0 ng/mL (ref 0.00–0.08)

## 2015-04-07 LAB — COMPREHENSIVE METABOLIC PANEL
ALT: 33 U/L (ref 17–63)
ANION GAP: 10 (ref 5–15)
AST: 18 U/L (ref 15–41)
Albumin: 3.5 g/dL (ref 3.5–5.0)
Alkaline Phosphatase: 136 U/L — ABNORMAL HIGH (ref 38–126)
BUN: 10 mg/dL (ref 6–20)
CALCIUM: 8.8 mg/dL — AB (ref 8.9–10.3)
CHLORIDE: 104 mmol/L (ref 101–111)
CO2: 25 mmol/L (ref 22–32)
Creatinine, Ser: 1.04 mg/dL (ref 0.61–1.24)
GFR calc non Af Amer: >60 mL/min (ref >60)
Glucose, Bld: 117 mg/dL — ABNORMAL HIGH (ref 65–99)
POTASSIUM: 4 mmol/L (ref 3.5–5.1)
SODIUM: 139 mmol/L (ref 135–145)
Total Bilirubin: 0.5 mg/dL (ref 0.3–1.2)
Total Protein: 6.1 g/dL — ABNORMAL LOW (ref 6.5–8.1)

## 2015-04-07 LAB — ETHANOL: Alcohol, Ethyl (B): <5 mg/dL (ref <5)

## 2015-04-07 LAB — APTT: aPTT: 32 seconds (ref 24–37)

## 2015-04-07 LAB — TROPONIN I

## 2015-04-07 LAB — PROTIME-INR
INR: 1.12 (ref 0.00–1.49)
Prothrombin Time: 14.6 seconds (ref 11.6–15.2)

## 2015-04-07 MED ORDER — SODIUM CHLORIDE 0.9 % IV BOLUS (SEPSIS)
1000.0000 mL | Freq: Once | INTRAVENOUS | Status: AC
  Administered 2015-04-08: 1000 mL via INTRAVENOUS

## 2015-04-07 MED ORDER — IOHEXOL 350 MG/ML SOLN
100.0000 mL | Freq: Once | INTRAVENOUS | Status: AC | PRN
  Administered 2015-04-07: 100 mL via INTRAVENOUS

## 2015-04-07 NOTE — ED Notes (Signed)
Code Stroke paged @ [phone]

## 2015-04-07 NOTE — Consult Note (Signed)
Stroke Consult Consulting Physician: Dr Manus Gunning ED  Chief Complaint:  HPI: Aaron Kennedy is an 44 y.o. male hx of recent CVA ( left ventral pontomedullary junction) on 12/02, HTN, HLD presenting with fluctuating right sided paresthesias and a numb/cold right foot. Patient has baseline right sided weakness from recent CVA, today at 1930 had sudden onset of tightness of RUE and increased numbness/paresthesias. Felt symptoms were similar to prior admission when he had his stroke. Overall feels symptoms have improved upon presentation to the ED though he continues to have a cold/numb right foot. Takes plavix at home.   CT head imaging reviewed and no acute process noted.   Date last known well: 04/07/2015 Time last known well: 1930 tPA Given: no, symptoms resolved, recent CVA  Modified Rankin: Rankin Score=1  Past Medical History  Diagnosis Date  . Hypertension   . Stroke [facility])     Past Surgical History  Procedure Laterality Date  . Hernia repair    . Leg surgery      Family History  Problem Relation Age of Onset  . Stroke Brother     42s  . Heart attack Sister     28s   Social History:  reports that he has been smoking.  He does not have any smokeless tobacco history on file. He reports that he drinks alcohol. He reports that he uses illicit drugs (Marijuana).  Allergies: No Known Allergies   (Not in a hospital admission)  ROS: Out of a complete 14 system review, the patient complains of only the following symptoms, and all other reviewed systems are negative. + numbness, paresthesias   Physical Examination: Filed Vitals:   04/07/15 2246 04/07/15 2247  BP: 146/91 139/89  Pulse: 75 71  Temp:    Resp: 17 19   Physical Exam  Constitutional: He appears well-developed and well-nourished.  Psych: Affect appropriate to situation Eyes: No scleral injection HENT: No OP obstrucion Head: Normocephalic.  Cardiovascular: Normal rate and regular rhythm.  Respiratory:  Effort normal and breath sounds normal.  GI: Soft. Bowel sounds are normal. No distension. There is no tenderness.  Skin: cold right foot, unable to palpate a pulse  Neurologic Examination: Mental Status: Alert, oriented, thought content appropriate.  Speech fluent without evidence of aphasia. No dysarthria noted. Able to follow 3 step commands without difficulty. Cranial Nerves: II: funduscopic exam wnl bilaterally, visual fields grossly normal, pupils equal, round, reactive to light and accommodation III,IV, VI: ptosis not present, extra-ocular motions intact bilaterally V,VII: smile symmetric, facial light touch sensation normal bilaterally VIII: hearing normal bilaterally IX,X: gag reflex present XI: trapezius strength/neck flexion strength normal bilaterally XII: tongue strength normal  Motor: Right : Upper extremity    Left:     Upper extremity 5/5 deltoid       5/5 deltoid 5/5 biceps      5/5 biceps  5/5 triceps      5/5 triceps 4+/5 hand grip (impaired fine motor)   5/5 hand grip  Lower extremity     Lower extremity 5/5 hip flexor      5/5 hip flexor 5/5 quadricep      5/5 quadriceps  5/5 hamstrings     5/5 hamstrings 5/5 plantar flexion       5/5 plantar flexion 5/5 plantar extension     5/5 plantar extension Tone and bulk:normal tone throughout; no atrophy noted Sensory: Pinprick and light touch intact throughout, bilaterally Deep Tendon Reflexes: 2+ and symmetric throughout Plantars: Right: downgoing  Left: downgoing Cerebellar: normal finger-to-nose, and normal heel-to-shin test Gait: deferred  Laboratory Studies:   Basic Metabolic Panel:  Recent Labs Lab 04/07/15 2241  NA 138  K 3.8  CL 101  GLUCOSE 112*  BUN 12  CREATININE 1.00    Liver Function Tests: No results for input(s): AST, ALT, ALKPHOS, BILITOT, PROT, ALBUMIN in the last 168 hours. No results for input(s): LIPASE, AMYLASE in the last 168 hours. No results for input(s): AMMONIA in the last  168 hours.  CBC:  Recent Labs Lab 04/07/15 2223 04/07/15 2241  WBC 8.5  --   NEUTROABS 4.9  --   HGB 15.2 16.3  HCT 43.5 48.0  MCV 95.0  --   PLT 197  --     Cardiac Enzymes: No results for input(s): CKTOTAL, CKMB, CKMBINDEX, TROPONINI in the last 168 hours.  BNP: Invalid input(s): POCBNP  CBG: No results for input(s): GLUCAP in the last 168 hours.  Microbiology: No results found for this or any previous visit.  Coagulation Studies: No results for input(s): LABPROT, INR in the last 72 hours.  Urinalysis: No results for input(s): COLORURINE, LABSPEC, PHURINE, GLUCOSEU, HGBUR, BILIRUBINUR, KETONESUR, PROTEINUR, UROBILINOGEN, NITRITE, LEUKOCYTESUR in the last 168 hours.  Invalid input(s): APPERANCEUR  Lipid Panel:     Component Value Date/Time   CHOL 189 03/19/2015 0546   TRIG 127 03/19/2015 0546   HDL 24* 03/19/2015 0546   CHOLHDL 7.9 03/19/2015 0546   VLDL 25 03/19/2015 0546   LDLCALC 140* 03/19/2015 0546    HgbA1C:  Lab Results  Component Value Date   HGBA1C 6.0* 03/19/2015    Urine Drug Screen:     Component Value Date/Time   LABOPIA NONE DETECTED 03/18/2015 1113   COCAINSCRNUR NONE DETECTED 03/18/2015 1113   LABBENZ NONE DETECTED 03/18/2015 1113   AMPHETMU NONE DETECTED 03/18/2015 1113   THCU POSITIVE* 03/18/2015 1113   LABBARB NONE DETECTED 03/18/2015 1113    Alcohol Level: No results for input(s): ETH in the last 168 hours.  Other results:  Imaging: No results found.  Assessment: 44 y.o. male hx of recent CVA, HTN, HLD presenting with transient increase in RUE paresthesias and a cold right foot. Patient currently back to baseline in regards to RUE symptoms. CT head imaging appears stable. Discussed possible MRI with patient but he wishes to hold off at this time. Low suspicion for new stroke based on history and presentation.   -continue plavix and BP management. Needs outpatient neurology follow up -further workup of RLE per ED    Elspeth Cho, DO [facility] [phone]  If 7pm- 7am, please page neurology on call as listed in AMION. 04/07/2015, 10:51 PM

## 2015-04-07 NOTE — Progress Notes (Signed)
Received and Reviewed records from [facility] Neurology. Updated EPIC chart as appropriate.   History of seizures (First seizure 08/10/04). Previously seen by [facility] Neurology in [facility]. Reviewed records on 04/07/15. Previously on Dilantin, Phenytoin, and Amitriptyline. Seizures occurred after traumatic injury. EEG on 08/21/04 normal. CT Head 08/10/04 negative.

## 2015-04-07 NOTE — ED Notes (Signed)
Seen here last week for weakness, tingling that was dx'd as TIAs. Tonight, R arm began tingling and going numb. Developed h/a shortly after. R foot numb, cold.

## 2015-04-07 NOTE — ED Notes (Addendum)
Pt screened by nurse first, reported new onset of left arm and leg tingling between 6-7pm, no weakness or sensation deficits noted, pt taken straight back to room with MD Rancour notified of symptoms, he will assess if patient is code stroke.

## 2015-04-07 NOTE — ED Provider Notes (Signed)
CSN: [id number]     Arrival date & time 04/07/15  2151 History   First MD Initiated Contact with Patient 04/07/15 2207     Chief Complaint  Patient presents with  . Stroke Symptoms   44 yo M w/PMH of recent CVA on 12/02, HTN, HLD who presents with right sided numbness and tingling: arm and leg. The began at 7:30 PM today and has been constant. It is very similar to his stroke a few weeks ago. He has also had a cold right foot and has worn double layers of sox on that side for 1-2 days. Patient has baseline right sided weakness from recent CVA, but had worsening numbness at 7:30PM. He also endorses that he felt some chest discomfort earlier today, now gone. Overall feels symptoms have improved upon presentation to the ED, though he continues to have a cold/numb right foot. Takes plavix at home.   He denies SOB, fever, chills, N/V, diarrhea, constipation, hematemesis, dysuria, hematuria, sick contacts, or recent travel.    Patient is a 44 y.o. male presenting with Acute Neurological Problem.  Cerebrovascular Accident This is a new (right body tingling) problem. The current episode started today. The problem occurs constantly. The problem has been gradually improving. Associated symptoms include numbness (right arm/leg) and weakness (right: residual from prior stroke). Pertinent negatives include no abdominal pain, chest pain, chills, fever, headaches, nausea, rash or vomiting. Nothing aggravates the symptoms. He has tried nothing for the symptoms.    Past Medical History  Diagnosis Date  . Hypertension   . Stroke [facility])    Past Surgical History  Procedure Laterality Date  . Hernia repair    . Leg surgery     Family History  Problem Relation Age of Onset  . Stroke Brother     60s  . Heart attack Sister     58s   Social History  Substance Use Topics  . Smoking status: Current Every Day Smoker -- 1.00 packs/day for 20 years  . Smokeless tobacco: None  . Alcohol Use: Yes    Review  of Systems  Constitutional: Negative for fever and chills.  Respiratory: Negative for shortness of breath.   Cardiovascular: Negative for chest pain, palpitations and leg swelling.  Gastrointestinal: Negative for nausea, vomiting, abdominal pain, diarrhea, constipation and abdominal distention.  Genitourinary: Negative for dysuria, frequency, flank pain and decreased urine volume.  Skin: Positive for pallor (right foot, cold right foot). Negative for rash and wound.  Neurological: Positive for weakness (right: residual from prior stroke) and numbness (right arm/leg). Negative for dizziness, speech difficulty, light-headedness and headaches.  All other systems reviewed and are negative.     Allergies  Review of patient's allergies indicates no known allergies.  Home Medications   Prior to Admission medications   Medication Sig Start Date End Date Taking? Authorizing Provider  amitriptyline (ELAVIL) 50 MG tablet TAKE THREE TABLETS BY MOUTH AT BEDTIME. 03/19/15  Yes Palma Holter, MD  atorvastatin (LIPITOR) 40 MG tablet Take 1 tablet (40 mg total) by mouth daily at 6 PM. 03/19/15  Yes Palma Holter, MD  clopidogrel (PLAVIX) 75 MG tablet Take 1 tablet (75 mg total) by mouth daily. 03/22/15  Yes Hillary Percell Boston, MD  gabapentin (NEURONTIN) 600 MG tablet Take 600-1,200 mg by mouth daily as needed (leg pain).    Yes Historical Provider, MD  lisinopril (PRINIVIL,ZESTRIL) 10 MG tablet Take 1 tablet (10 mg total) by mouth daily. 03/20/15  Yes Palma Holter, MD  metoprolol succinate (TOPROL-XL) 25 MG 24 hr tablet Take 1 tablet (25 mg total) by mouth daily. 03/20/15  Yes Palma Holter, MD  nicotine polacrilex (NICORETTE) 2 MG gum Take 1 each (2 mg total) by mouth as needed for smoking cessation. Patient not taking: Reported on 03/21/2015 03/19/15   Palma Holter, MD   BP 136/90 mmHg  Pulse 74  Temp(Src) 98.2 F (36.8 C) (Oral)  Resp 15  SpO2 94% Physical Exam   Constitutional: He is oriented to person, place, and time. He appears well-developed and well-nourished. No distress.  HENT:  Head: Normocephalic and atraumatic.  Eyes: Pupils are equal, round, and reactive to light.  Neck: Normal range of motion.  Cardiovascular: Normal rate, regular rhythm, normal heart sounds and intact distal pulses.  Exam reveals no gallop and no friction rub.   No murmur heard. Pulmonary/Chest: Effort normal and breath sounds normal. No respiratory distress. He has no wheezes. He has no rales. He exhibits no tenderness.  Abdominal: Soft. Bowel sounds are normal. He exhibits no distension and no mass. There is no tenderness. There is no rebound and no guarding.  Musculoskeletal: Normal range of motion. He exhibits no edema or tenderness.  Lymphadenopathy:    He has no cervical adenopathy.  Neurological: He is alert and oriented to person, place, and time. He has normal strength. A sensory deficit (diminiished on right arm /leg) is present. No cranial nerve deficit. Coordination normal.  Skin: Skin is warm and dry. He is not diaphoretic.  Cool right foot. No pulses palpated or dopplerable.   Nursing note and vitals reviewed.   ED Course  .Critical Care Performed by: Rachelle Hora Authorized by: Rachelle Hora Total critical care time: 30 minutes Critical care time was exclusive of separately billable procedures and treating other patients. Critical care was necessary to treat or prevent imminent or life-threatening deterioration of the following conditions: code stroke and pulseless foot. Critical care was time spent personally by me on the following activities: development of treatment plan with patient or surrogate, ordering and performing treatments and interventions, re-evaluation of patient's condition, discussions with consultants, evaluation of patient's response to treatment, examination of patient, ordering and review of radiographic studies, review of old charts  and ordering and review of laboratory studies. Subsequent provider of critical care: I assumed direction of critical care for this patient from another provider of my specialty.   (including critical care time) Labs Review Labs Reviewed  COMPREHENSIVE METABOLIC PANEL - Abnormal; Notable for the following:    Glucose, Bld 117 (*)    Calcium 8.8 (*)    Total Protein 6.1 (*)    Alkaline Phosphatase 136 (*)    All other components within normal limits  I-STAT CHEM 8, ED - Abnormal; Notable for the following:    Glucose, Bld 112 (*)    Calcium, Ion 1.09 (*)    All other components within normal limits  ETHANOL  PROTIME-INR  APTT  CBC  DIFFERENTIAL  TROPONIN I  URINE RAPID DRUG SCREEN, HOSP PERFORMED  URINALYSIS, ROUTINE W REFLEX MICROSCOPIC (NOT AT [facility])  I-STAT TROPOININ, ED  I-STAT CHEM 8, ED  I-STAT TROPOININ, ED    Imaging Review Ct Head Wo Contrast  04/07/2015  CLINICAL DATA:  44 year old male with right-sided numbness, headache, and dizziness. Code stroke. EXAM: CT HEAD WITHOUT CONTRAST TECHNIQUE: Contiguous axial images were obtained from the base of the skull through the vertex without intravenous contrast. COMPARISON:  MRI dated 03/21/2015 FINDINGS: The ventricles and  sulci are appropriate in size for patient's age. Mild periventricular and deep white matter hypodensities represent chronic microvascular ischemic changes and advanced for the patient's age. There is no intracranial hemorrhage. No mass effect or midline shift identified. Posterior fossa CSF density likely representing prominent cisterna magna or arachnoid cyst. The visualized paranasal sinuses and mastoid air cells are well aerated. Small left maxillary sinus retention cyst or polyp. The calvarium is intact. IMPRESSION: No acute intracranial hemorrhage. Mild chronic microvascular ischemic disease, advanced for patient's age. If symptoms persist and there are no contraindications, MRI may provide better evaluation if  clinically indicated. These results were called by telephone at the time of interpretation on 04/07/2015 at 10:52 pm to Dr.Sumner, who verbally acknowledged these results. Electronically Signed   By: Elgie Collard M.D.   On: 04/07/2015 22:53   I have personally reviewed and evaluated these images and lab results as part of my medical decision-making.   EKG Interpretation   Date/Time:  Monday April 07 2015 22:07:09 EST Ventricular Rate:  78 PR Interval:  169 QRS Duration: 101 QT Interval:  386 QTC Calculation: 440 R Axis:   8 Text Interpretation:  Sinus rhythm No significant change was found  Confirmed by Manus Gunning  MD, STEPHEN [phone]) on 04/07/2015 10:16:42 PM      MDM   Final diagnoses:  Cold foot  Numbness and tingling of right arm and leg    45 yo M w/recent CVA who presents w/right sided numbness and tingling as well as pulseless right foot that is cold. See HPI for details. On exam, NAD, AFVSS. Right foot is colder and very, very faint palpable DP pulses. Great pulses on left foot. Pt has no facial droop or weakness on exam but has subjective decreased sensation/tingling on the right arm and leg. Remainder of exam benign.   Concern for acute stroke. Code stroke called. Not a candidate for tpa given recent stroke. CT head wnl. Neuro recommends MRI: no emergent need now.   More concerning now is the pulseless right foot. W/stroke sx and CP earlier, must entertain dissection. However, would be rare with normotension and no back pain. Equal BPs in both arms.  Nevertheless, CT angio abdomen w/runoff (legs) ordered. Labs pending, as well. CP not c/w ACS as no active pain, EKG wnl. Trop negative.   Discussed w/vascular surgery who feel CT chest needs to be included if dissection likely. Discussed with radiology. CT angio for chest/abd/legs impossible to perform with timing of contrast. However, will get CT w/contrast added.   Admit to FM. If dissection or embolus, vascular to  intervene.   Pt was seen under the supervision of Dr. Manus Gunning.     Rachelle Hora, MD 04/08/15 [id number]  Glynn Octave, MD 04/08/15 [phone]

## 2015-04-08 ENCOUNTER — Emergency Department (HOSPITAL_COMMUNITY): Admission: EM | Admit: 2015-04-08 | Payer: MEDICAID | Source: Home / Self Care

## 2015-04-08 DIAGNOSIS — I70211 Atherosclerosis of native arteries of extremities with intermittent claudication, right leg: Secondary | ICD-10-CM

## 2015-04-08 DIAGNOSIS — I639 Cerebral infarction, unspecified: Secondary | ICD-10-CM | POA: Diagnosis present

## 2015-04-08 LAB — URINALYSIS, ROUTINE W REFLEX MICROSCOPIC
Bilirubin Urine: NEGATIVE
Glucose, UA: NEGATIVE mg/dL
Hgb urine dipstick: NEGATIVE
Ketones, ur: NEGATIVE mg/dL
LEUKOCYTES UA: NEGATIVE
Nitrite: NEGATIVE
PROTEIN: NEGATIVE mg/dL
SPECIFIC GRAVITY, URINE: 1.025 (ref 1.005–1.030)
pH: 7 (ref 5.0–8.0)

## 2015-04-08 LAB — RAPID URINE DRUG SCREEN, HOSP PERFORMED
Amphetamines: NOT DETECTED
BARBITURATES: NOT DETECTED
Benzodiazepines: NOT DETECTED
Cocaine: NOT DETECTED
Opiates: NOT DETECTED
Tetrahydrocannabinol: POSITIVE — AB

## 2015-04-08 NOTE — ED Notes (Signed)
MD at bedside. 

## 2015-04-08 NOTE — Consult Note (Signed)
Family Medicine Teaching [facility] Consult Note Service Pager: [phone]  Patient name: Aaron Kennedy Medical record number: [id number] Date of birth: 1970-11-01 Age: 44 y.o. Gender: male  Primary Care Provider: Uvaldo Rising, MD Consultants: Neuro/ Vascular  Chief Complaint: RUE tightness  Assessment and Plan: Aaron Kennedy is a 44 y.o. male presenting with RUE tightness . PMH is significant for recent CVA, tobacco use d/o, combined systolic and diastolic dysfunction, HTN, pre-DIABETES, seizures  Right foot coolness: Notes of history of injury about 1 year ago.  X-ray 03/18/15 corticated ossific fragments adjacent to the medial malleolus likely reflecting sequela of prior avulsive injury, but no acute injury noted.  Seen in ED today for R sided numbness.  Found to have a 3 day history of cool foot.  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  No evidence of Ao dissection on CT chest.  Dr Early from vascular evaluated patient in foot and explained that condition is benign.  He recommends continuing life normally.  No outpatient follow up needed at this time.  Patient with palpable RLE dorsalis pedis pulse, albeit more difficult to palpate than LLE.  Pulse also located via doppler in ED.  H/o recent CVA:  No focal neuro deficits on exam.  Carotid Duplex (03/19/15) showed 1-39% internal carotid artery stenosis bilaterally. Vertebral arteries are patent with antegrade flow. MRI 03/21/15 showed: acute ischemia within LEFT ventral pontomedullary junction. Multiple family members with CVA or MI in their 78s. Neuro has seen in ED.  They considered repeating MRI if patient wishes but patient declines repeat MRI.  Low suspicion for recurrent stroke. Continue Plavix & BP management.  Follow up with Neuro outpatient.    Disposition: Discharge from ED with close outpatient follow up with [facility] and Neuro.  Discussed this with patient and wife.  Since there is no intervention needed for  RLE and low suspicion for recurrent CVA, patient stable for discharge home.    History of Present Illness:  Aaron Kennedy is a 44 y.o. male presenting with RUE tightness Patient reports that he has resumed light duty at work.  He noticed RUE tightness after doing several household chores requiring UE repetitive movements.  Some weakness with overhead motions but otherwise doing well at home.  Able to perform ADLs and ambulate independently.  He notes that right foot has been cool for several days and he has been wearing extra socks to keep warm.  He notes that foot would seem to fall asleep before he had injured it several years ago.  He notes he had to tap it to get blood flow back to it.  Pain in foot seemed to worsen after CVA.  No weakness or falls.    Additionally, patient continues to smoke but notes that he has cut back to 5 cigs/ day.  Review Of Systems: Per HPI with the following additions: none.   Otherwise the remainder of the systems were negative.  Patient Active Problem List   Diagnosis Date Noted  . Stroke (cerebrum) (HCC) 04/08/2015  . 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 CVA (cerebral vascular accident) (HCC) 04/03/2015  . Seizures (HCC) 04/03/2015  . Essential hypertension   . Stroke (HCC) 03/22/2015  . HTN (hypertension) 03/22/2015  . HLD (hyperlipidemia) 03/22/2015  . Prediabetes 03/22/2015  . Tobacco use disorder 03/22/2015  . [name] (transient ischemic attack) 03/18/2015  . History of fracture of ankle   . Right ankle pain  Past Medical History: Past Medical History  Diagnosis Date  . Hypertension   . Stroke [facility])     Past Surgical History: Past Surgical History  Procedure Laterality Date  . Hernia repair    . Leg surgery      Social History: Social History  Substance Use Topics  . Smoking status: Current Every Day Smoker -- 1.00 packs/day for 20 years  . Smokeless tobacco: None  . Alcohol Use: Yes   Additional  social history: none  Please also refer to relevant sections of EMR.  Family History: Family History  Problem Relation Age of Onset  . Stroke Brother     24s  . Heart attack Sister     73s    Allergies and Medications: No Known Allergies No current facility-administered medications on file prior to encounter.   Current Outpatient Prescriptions on File Prior to Encounter  Medication Sig Dispense Refill  . amitriptyline (ELAVIL) 50 MG tablet TAKE THREE TABLETS BY MOUTH AT BEDTIME. 90 tablet 0  . atorvastatin (LIPITOR) 40 MG tablet Take 1 tablet (40 mg total) by mouth daily at 6 PM. 30 tablet 0  . clopidogrel (PLAVIX) 75 MG tablet Take 1 tablet (75 mg total) by mouth daily. 30 tablet 0  . gabapentin (NEURONTIN) 600 MG tablet Take 600-1,200 mg by mouth daily as needed (leg pain).     Marland Kitchen lisinopril (PRINIVIL,ZESTRIL) 10 MG tablet Take 1 tablet (10 mg total) by mouth daily. 30 tablet 0  . metoprolol succinate (TOPROL-XL) 25 MG 24 hr tablet Take 1 tablet (25 mg total) by mouth daily. 30 tablet 0  . nicotine polacrilex (NICORETTE) 2 MG gum Take 1 each (2 mg total) by mouth as needed for smoking cessation. (Patient not taking: Reported on 03/21/2015) 100 tablet 0    Objective: BP 136/90 mmHg  Pulse 74  Temp(Src) 98.2 F (36.8 C) (Oral)  Resp 15  SpO2 94% Exam: General: awake, alert, NAD, sitting up in bed, wife and Dr Arbie Cookey at bedside Eyes: EOMI, PERRLA ENTM: [place]/AT, MMM Neck: FROM, supple Cardiovascular: RRR, no murmrus Respiratory: CTAB, Normal WOB Abdomen: soft, NT/ND, +BS MSK: moves all extremities independently, 5/5 strength of UE and LE Skin: no rashes Neuro: CN 2-12 grossly in tact, light touch sensation intact Psych: mood stable, speech normal  Labs and Imaging: CBC BMET   Recent Labs Lab 04/07/15 2223 04/07/15 2241  WBC 8.5  --   HGB 15.2 16.3  HCT 43.5 48.0  PLT 197  --     Recent Labs Lab 04/07/15 2223 04/07/15 2241  NA 139 138  K 4.0 3.8  CL 104 101   CO2 25  --   BUN 10 12  CREATININE 1.04 1.00  GLUCOSE 117* 112*  CALCIUM 8.8*  --      Ct Head Wo Contrast  04/07/2015  CLINICAL DATA:  44 year old male with right-sided numbness, headache, and dizziness. Code stroke. EXAM: CT HEAD WITHOUT CONTRAST TECHNIQUE: Contiguous axial images were obtained from the base of the skull through the vertex without intravenous contrast. COMPARISON:  MRI dated 03/21/2015 FINDINGS: The ventricles and sulci are appropriate in size for patient's age. Mild periventricular and deep white matter hypodensities represent chronic microvascular ischemic changes and advanced for the patient's age. There is no intracranial hemorrhage. No mass effect or midline shift identified. Posterior fossa CSF density likely representing prominent cisterna magna or arachnoid cyst. The visualized paranasal sinuses and mastoid air cells are well aerated. Small left maxillary sinus retention cyst or  polyp. The calvarium is intact. IMPRESSION: No acute intracranial hemorrhage. Mild chronic microvascular ischemic disease, advanced for patient's age. If symptoms persist and there are no contraindications, MRI may provide better evaluation if clinically indicated. These results were called by telephone at the time of interpretation on 04/07/2015 at 10:52 pm to Dr.Sumner, who verbally acknowledged these results. Electronically Signed   By: Elgie Collard M.D.   On: 04/07/2015 22:53   Ct Chest W Contrast  04/08/2015  CLINICAL DATA:  Weakness and tingling of the right arm. Cold numb right foot. EXAM: CT ANGIOGRAPHY AOBIFEM WITHOUT AND WITH CONTRAST; CT CHEST WITH CONTRAST TECHNIQUE: Arterially timed CT angiography of the abdominal aorta and lower extremities was performed after bolus administration of iodinated contrast. Subsequently chest CT with contrast was performed to evaluate the thoracic aorta. Due to scanner limitations, chest imaging was performed in the parenchymal phase. CONTRAST:   OMNIPAQUE IOHEXOL 350 MG/ML SOLN COMPARISON:  Chest and abdomen CT 09/02/2010. FINDINGS: Arterial findings on CTA Standard abdominal aortic branching with patent visceral branches. There is marked circumferential predominately noncalcified thickening of the abdominal aorta consistent with atherosclerosis and progressed from abdominal CT 09/02/2010. There is no surrounding fat edema typical of aortitis. 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 Right lower extremity: Atheromatous changes to the posterior wall common femoral artery without stenosis. The superficial femoral artery and profunda are widely patent. Widely patent popliteal artery and proximal anterior tibial, posterior tibial, and peroneal arteries. Gradual progressive attenuation of the posterior tibial artery in the calf, with the peroneal artery serving the plantar arteries after crossing posterior to the ankle joint. Symmetric dorsalis pedis flow, faint due to contrast timing. Left lower extremity: Mild atherosclerotic narrowing of the proximal external iliac artery but no significant inflow stenosis. Notable noncalcified plaque in the proximal superficial femoral artery without stenosis. When accounting for faint distal arterial opacification due to contrast timing, there is likely three-vessel runoff. No evidence of dissection, aneurysm, or hemorrhage. Non arterial findings THORACIC INLET/BODY WALL: No acute abnormality. MEDIASTINUM: Normal heart size. No pericardial effusion. Atheromatous wall thickening of the aorta without dissection or evidence of intramural hematoma. No adenopathy. LUNG WINDOWS: No consolidation.  No effusion.  No suspicious pulmonary nodule. Abdominal wall: Changes of left inguinal hernia repair. Shallow fatty inguinal hernia on the right. Hepatobiliary: No focal liver abnormality.No evidence of biliary obstruction or stone. Pancreas: Unremarkable.  Spleen: Nonspecific few coarse calcifications. Adrenals/Urinary Tract: Negative adrenals. No hydronephrosis or stone. Unremarkable bladder. Reproductive:No pathologic findings. Stomach/Bowel:  No obstruction. No appendicitis. Vascular/Lymphatic: No acute vascular abnormality. No mass or adenopathy. Peritoneal: No ascites or pneumoperitoneum. Musculoskeletal: Remote, healed left femur fracture status post ORIF. Review of the MIP images confirms the above findings. IMPRESSION: 1. No acute arterial finding including aortic dissection. 2. 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 Electronically Signed   By: Marnee Spring M.D.   On: 04/08/2015 01:07   Ct Angio Ao+bifem W/cm &/or Wo/cm  04/08/2015  CLINICAL DATA:  Weakness and tingling of the right arm. Cold numb right foot. EXAM: CT ANGIOGRAPHY AOBIFEM WITHOUT AND WITH CONTRAST; CT CHEST WITH CONTRAST TECHNIQUE: Arterially timed CT angiography of the abdominal aorta and lower extremities was performed after bolus administration of iodinated contrast. Subsequently chest CT with contrast was performed to evaluate the thoracic aorta. Due to scanner limitations, chest imaging was performed in the parenchymal phase. CONTRAST:  OMNIPAQUE IOHEXOL 350 MG/ML SOLN COMPARISON:  Chest and abdomen CT 09/02/2010. FINDINGS: Arterial findings on CTA Standard abdominal aortic branching with patent visceral branches. There is marked circumferential predominately noncalcified thickening of the abdominal aorta consistent with atherosclerosis and progressed from abdominal CT 09/02/2010. There is no surrounding fat edema typical of aortitis. 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 Right lower extremity: Atheromatous changes to the posterior wall common femoral artery without stenosis. The superficial femoral artery and profunda are widely patent. Widely patent  popliteal artery and proximal anterior tibial, posterior tibial, and peroneal arteries. Gradual progressive attenuation of the posterior tibial artery in the calf, with the peroneal artery serving the plantar arteries after crossing posterior to the ankle joint. Symmetric dorsalis pedis flow, faint due to contrast timing. Left lower extremity: Mild atherosclerotic narrowing of the proximal external iliac artery but no significant inflow stenosis. Notable noncalcified plaque in the proximal superficial femoral artery without stenosis. When accounting for faint distal arterial opacification due to contrast timing, there is likely three-vessel runoff. No evidence of dissection, aneurysm, or hemorrhage. Non arterial findings THORACIC INLET/BODY WALL: No acute abnormality. MEDIASTINUM: Normal heart size. No pericardial effusion. Atheromatous wall thickening of the aorta without dissection or evidence of intramural hematoma. No adenopathy. LUNG WINDOWS: No consolidation.  No effusion.  No suspicious pulmonary nodule. Abdominal wall: Changes of left inguinal hernia repair. Shallow fatty inguinal hernia on the right. Hepatobiliary: No focal liver abnormality.No evidence of biliary obstruction or stone. Pancreas: Unremarkable. Spleen: Nonspecific few coarse calcifications. Adrenals/Urinary Tract: Negative adrenals. No hydronephrosis or stone. Unremarkable bladder. Reproductive:No pathologic findings. Stomach/Bowel:  No obstruction. No appendicitis. Vascular/Lymphatic: No acute vascular abnormality. No mass or adenopathy. Peritoneal: No ascites or pneumoperitoneum. Musculoskeletal: Remote, healed left femur fracture status post ORIF. Review of the MIP images confirms the above findings. IMPRESSION: 1. No acute arterial finding including aortic dissection. 2. 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 Electronically Signed   By: Marnee Spring M.D.   On: 04/08/2015 01:07     Raliegh Ip, DO 04/08/2015, 12:23 AM PGY-2, [facility] FPTS Intern pager: [phone], text pages welcome

## 2015-04-08 NOTE — Consult Note (Signed)
Patient name: Aaron Kennedy MRN: [id number] DOB: November 11, 1970 Sex: male     Reason for referral:  Chief Complaint  Patient presents with  . Stroke Symptoms    HISTORY OF PRESENT ILLNESS: Patient is a complex 44 year old gentleman presenting to the emergency room this evening with recurrent strokelike symptoms. He was admitted on 03/21/2015 with right arm weakness related to left brain event. Duplex that time showed no evidence of carotid stenosis. He presented this evening with the worsening symptoms. Has been seen by stroke service feels that this is not a new event but related to his prior event. He was also found to have some coolness in his right leg and the vascular was consult for further evaluation of this. On further questioning the patient reports that for years he has had mild claudication symptoms in his right leg. He reports that he noted this first when playing basketball if he stops for a few minutes and pulses foot back this can be relieved. Since the event on 03/21/2015 these had new symptoms of numbness as well. No history of tissue loss. Does have a long history of smoking. No history of cardiac disease.  Past Medical History  Diagnosis Date  . Hypertension   . Stroke [facility])     Past Surgical History  Procedure Laterality Date  . Hernia repair    . Leg surgery      Social History   Social History  . Marital Status: Single    Spouse Name: N/A  . Number of Children: N/A  . Years of Education: N/A   Occupational History  . Not on file.   Social History Main Topics  . Smoking status: Current Every Day Smoker -- 1.00 packs/day for 20 years  . Smokeless tobacco: Not on file  . Alcohol Use: Yes  . Drug Use: Yes    Special: Marijuana  . Sexual Activity: Not on file   Other Topics Concern  . Not on file   Social History Narrative    Family History  Problem Relation Age of Onset  . Stroke Brother     81s  . Heart attack Sister     11s     Allergies as of 04/07/2015  . (No Known Allergies)    No current facility-administered medications on file prior to encounter.   Current Outpatient Prescriptions on File Prior to Encounter  Medication Sig Dispense Refill  . amitriptyline (ELAVIL) 50 MG tablet TAKE THREE TABLETS BY MOUTH AT BEDTIME. 90 tablet 0  . atorvastatin (LIPITOR) 40 MG tablet Take 1 tablet (40 mg total) by mouth daily at 6 PM. 30 tablet 0  . clopidogrel (PLAVIX) 75 MG tablet Take 1 tablet (75 mg total) by mouth daily. 30 tablet 0  . gabapentin (NEURONTIN) 600 MG tablet Take 600-1,200 mg by mouth daily as needed (leg pain).     Marland Kitchen lisinopril (PRINIVIL,ZESTRIL) 10 MG tablet Take 1 tablet (10 mg total) by mouth daily. 30 tablet 0  . metoprolol succinate (TOPROL-XL) 25 MG 24 hr tablet Take 1 tablet (25 mg total) by mouth daily. 30 tablet 0  . nicotine polacrilex (NICORETTE) 2 MG gum Take 1 each (2 mg total) by mouth as needed for smoking cessation. (Patient not taking: Reported on 03/21/2015) 100 tablet 0     REVIEW OF SYSTEMS: Reviewed in his history and physical with nothing to add  PHYSICAL EXAMINATION:  General: The patient is a well-nourished male, in no acute distress. Vital signs  are BP 160/105 mmHg  Pulse 74  Temp(Src) 98.2 F (36.8 C) (Oral)  Resp 21  SpO2 98% Pulmonary: There is a good air exchange  Abdomen: Soft and non-tender with no masses noted Musculoskeletal: There are no major deformities.  There is no significant extremity pain. Neurologic: No focal weakness or paresthesias are detected, Skin: There are no ulcer or rashes noted. Psychiatric: The patient has normal affect. Cardiovascular: 2+ radial pulses bilaterally 2+ femoral pulse on the left. Diminished on the right. 2+ dorsalis pedis pulse on the left and 1+ on the right   Reviewed his CT scan from 2012. This revealed occlusion of his right common iliac artery at that scan with reconstitution of the external iliac and normal runoff  below in the pelvis  Reviewed the CT abdomen pelvis and runoff today and discussed this with the patient and his family present. This reveals no change from his 2012 image. Has complete occlusion of his right common iliac artery with reconstitution of his right external iliac via right internal iliac collaterals. 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 Discussed this at length with the patient and his family. Explained the critical importance of smoking cessation. Do not see any relationship between this and his recent neurologic deficit. No evidence of carotid disease on recent carotid duplex. Discussed this with the family practice teaching service as well. Will not follow actively. Please consult if we can provide any help.    Gretta Began [facility] [place] Office: [phone]

## 2015-04-08 NOTE — ED Provider Notes (Signed)
Received phone call from family medicine team that patient can be discharged. Patient had been previously admitted but apparently had vascular surgery and neurology consults and felt that patient could be discharged. I read through the vascular surgery note, neurology note, previous ED attending note and resident note. I also looked at all the imaging and everything appears stable. The family medicine team has/will shortly staff with their attending per their protocol. Will discharge patient from ED per family medicine wishes.    Labs Reviewed  COMPREHENSIVE METABOLIC PANEL - Abnormal; Notable for the following:    Glucose, Bld 117 (*)    Calcium 8.8 (*)    Total Protein 6.1 (*)    Alkaline Phosphatase 136 (*)    All other components within normal limits  URINE RAPID DRUG SCREEN, HOSP PERFORMED - Abnormal; Notable for the following:    Tetrahydrocannabinol POSITIVE (*)    All other components within normal limits  I-STAT CHEM 8, ED - Abnormal; Notable for the following:    Glucose, Bld 112 (*)    Calcium, Ion 1.09 (*)    All other components within normal limits  ETHANOL  PROTIME-INR  APTT  CBC  DIFFERENTIAL  URINALYSIS, ROUTINE W REFLEX MICROSCOPIC (NOT AT [facility])  TROPONIN I  I-STAT TROPOININ, ED  I-STAT CHEM 8, ED  I-STAT TROPOININ, ED    CT Chest W Contrast  Final Result    CT Angio Ao+Bifem W/Cm &/Or Wo/Cm  Final Result    CT Head Wo Contrast  Final Result      No Follow-up on file.     Editor: Raliegh Ip, DO (Resident)      Related Notes: Original Note by Raliegh Ip, DO (Resident) filed at 04/08/2015 1:18 AM   Cosign Required: Yes       Expand All Collapse All   Family Medicine Teaching [facility] Consult Note Service Pager: [phone]  Patient name: Aaron Revolorio McDonaldMedical record number: [id number] Date of birth: [birth year]Age: 44 y.o.Gender: male  Primary Care Provider: Uvaldo Rising, MD Consultants: Neuro/  Vascular  Chief Complaint: RUE tightness  Assessment and Plan: Aaron Kennedy is a 44 y.o. male presenting with RUE tightness . PMH is significant for recent CVA, tobacco use d/o, combined systolic and diastolic dysfunction, HTN, pre-DIABETES, seizures  Right foot coolness: Notes of history of injury about 1 year ago. X-ray 03/18/15 corticated ossific fragments adjacent to the medial malleolus likely reflecting sequela of prior avulsive injury, but no acute injury noted. Seen in ED today for R sided numbness. Found to have a 3 day history of cool foot. 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 No evidence of Ao dissection on CT chest. Dr Early from vascular evaluated patient in foot and explained that condition is benign. He recommends continuing life normally. No outpatient follow up needed at this time. Patient with palpable RLE dorsalis pedis pulse, albeit more difficult to palpate than LLE. Pulse also located via doppler in ED.  H/o recent CVA: No focal neuro deficits on exam. Carotid Duplex (03/19/15) showed 1-39% internal carotid artery stenosis bilaterally. Vertebral arteries are patent with antegrade flow. MRI 03/21/15 showed: acute ischemia within LEFT ventral pontomedullary junction. Multiple family members with CVA or MI in their 74s. Neuro has seen in ED. They considered repeating MRI if patient wishes but patient declines repeat MRI. Low suspicion for recurrent stroke. Continue Plavix & BP management. Follow up with Neuro outpatient.   Disposition: Discharge from ED with close outpatient  follow up with [facility] and Neuro. Discussed this with patient and wife. Since there is no intervention needed for RLE and low suspicion for recurrent CVA, patient stable for discharge home.       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 Discussed this at length with the patient and his family. Explained the  critical importance of smoking cessation. Do not see any relationship between this and his recent neurologic deficit. No evidence of carotid disease on recent carotid duplex. Discussed this with the family practice teaching service as well. Will not follow actively. Please consult if we can provide any help.  Gretta Began [facility] [place] Office: [phone]    Assessment: 44 y.o. male hx of recent CVA, HTN, HLD presenting with transient increase in RUE paresthesias and a cold right foot. Patient currently back to baseline in regards to RUE symptoms. CT head imaging appears stable. Discussed possible MRI with patient but he wishes to hold off at this time. Low suspicion for new stroke based on history and presentation.   -continue plavix and BP management. Needs outpatient neurology follow up -further workup of RLE per ED  Elspeth Cho, DO [facility] [phone]   Marily Memos, MD 04/08/15 [phone]

## 2015-04-08 NOTE — ED Notes (Signed)
Pt departed under his own power and in NAD.  

## 2015-04-10 ENCOUNTER — Other Ambulatory Visit: Payer: Self-pay

## 2015-04-10 NOTE — Patient Outreach (Signed)
[facility]) Care Management  04/10/2015  Aaron Kennedy 14-Apr-1971 [id number]  SUBJECTIVE; Telephone call to patient regarding EMMI stroke follow up.  Patient states he is doing ok.  Patient states he went to the department of social services to submit his application for Medicaid. Patient states he will be meeting with his supervisor on tomorrow to complete the paperwork that needs to be given to social work to complete his Medicaid application. Patient states he will follow up with social services on Tuesday of next week to submit final paperwork. Patient states he also applied for medication assistance with social services. Patient states he has an appointment scheduled with social services on May 07, 2015 to complete disability application.  Patient states he has not scheduled a follow up appointment with his primary, or the neurologist because he is trying to get Medicaid first.  Patient states he has not received a follow up call from his primary MD office regarding an appointment date for therapy but states he will not be able to afford it anyway until he gets Medicaid.   Patient state he is monitoring his blood pressure and recording. Patient states today's blood pressure reading was 139/82. Patient states this is a good reading for him because it use to be in the 160's over 80-90's.  Patient states, " I think the blood pressure medication is working."  Patient states he continues to work on decreasing his cigarette intake. Patient states he is down to approximately 4 cigarettes per day. Patient refuses a formal smoking cessation program at this time. Patient states he is going to try to do it himself. Patient states he continues to adhere to a low carbohydrate/fat diet.  RNCM discussed with patient importance of adhering to low sodium diet as well.   Patient states he went to the emergency department on 04/07/15 due to chest tightness and shortness of breath. Patient  states he had a CT scan. Patient states he was found to have a blockage but the doctor does not want to do anything at this time, just monitor.  RNCM reviewed with patient signs and symptoms of stroke and heart attack. Patient advised to contact 911 for these symptoms. Patient voiced understanding .   ASSESSMENT: EMMI stroke transition program follow up.   PLAN; RNCM will follow up with patient within 2 weeks.  Patient will report to patient that he has submitted complete Medicaid paperwork.  RNCM will determine at next outreach if social work referral is needed.   [name] RN,BSN,CCM [facility] Telephonic Care Coordinator [phone]  RNCM will send patient EMMI education material on low sodium diet, hypertension monitoring at home.

## 2015-04-15 ENCOUNTER — Other Ambulatory Visit: Payer: Self-pay | Admitting: *Deleted

## 2015-04-15 ENCOUNTER — Other Ambulatory Visit: Payer: Self-pay | Admitting: Family Medicine

## 2015-04-15 MED ORDER — LISINOPRIL 10 MG PO TABS: 10.0000 mg | ORAL_TABLET | Freq: Every day | ORAL | Status: DC

## 2015-04-15 MED ORDER — METOPROLOL SUCCINATE ER 25 MG PO TB24: 25.0000 mg | ORAL_TABLET | Freq: Every day | ORAL | Status: DC

## 2015-04-15 NOTE — Telephone Encounter (Signed)
Pt needs refill on lisinopril and metoprolol sent to [facility] [address]. Please contact pt once sent. Thank you, Dorothey Baseman, ASA

## 2015-04-18 ENCOUNTER — Other Ambulatory Visit: Payer: Self-pay

## 2015-04-18 NOTE — Patient Outreach (Signed)
[facility]) Care Management  04/18/2015  CHINEMEREM ALDABA Jul 14, 1970 [id number]  Telephone call to patient regarding EMMI stroke follow up . Unable to reach patient. HIPAA compliant voice message left with call back phone number.   PLAN; RNCM will attempt 2nd telephone outreach to patient within 1 week.   [name] RN,BSN,CCM [facility] Telephonic Care Coordinator [phone]

## 2015-04-22 ENCOUNTER — Other Ambulatory Visit: Payer: Self-pay

## 2015-04-22 ENCOUNTER — Other Ambulatory Visit: Payer: Self-pay | Admitting: Family Medicine

## 2015-04-22 MED ORDER — CLOPIDOGREL BISULFATE 75 MG PO TABS: 75.0000 mg | ORAL_TABLET | Freq: Every day | ORAL | Status: DC

## 2015-04-22 MED ORDER — AMITRIPTYLINE HCL 50 MG PO TABS: ORAL_TABLET | ORAL | Status: DC

## 2015-04-22 NOTE — Patient Outreach (Signed)
[facility]) Care Management  04/22/2015  Aaron Kennedy Aug 09, 1970 [id number]   SUBJECTIVE: Telephone call to patient regarding EMMI stroke follow up.  HIPAA verified with patient. Patient states he continues to do ok.  Patient voiced that he was pleased with his blood pressure.  Patient states his blood pressure on 04/21/15 was 117/77.  Patient states he has not checked his blood pressure today.  RNCM reminded patient of importance of checking blood pressure daily and recording results. Patient verbalized understanding. Patient states he neglected his blood pressure for 2 years and he had this stroke.  Patient states he will not neglect taking his blood pressure medications and checking his blood pressure anymore.  Patient states he continues to take his medications as prescribed. Patient states due to him not having medical insurance two of his medications are expensive for him, Metoprolol and Plavix.  Patient states he has 4 days left of his Plavix.  Patient states he called in a refill request today with his doctor for Plavix and amitriptyline.  Patient states he is able to obtain the rest of his medications from [facility] on the $4.00 list.  Patient states he continues to have right sided weakness. Patient states he is unable to take physical therapy at this time due to not having insurance. Patient states he has met with his case worker at the social services office and has been told that he needs to make an appointment with the disability office. Patient states his appointment with the disability office is 05/07/15. Patient states he has not made a follow up appointment with his primary MD or neurologist due to not having medical insurance. RNCM informed patient of [employer] financial assistance and discount program. Patient states he is willing to apply. RNCM advised patient once application received if assistance is needed call RNCM or contact customer service phone number listed  on application for assistance.  RNCM contacted [facility] care management clinical pharmacist, Elisabeth Most.  RNCM informed by pharmacist that patients medication Metoprolol and Plavix can be obtained from [facility] for $10.00 each for a 30 day supply. Patient states he has a friend that is a [employer] and he will see if he is able to obtain the medication that way.  Also patient advised to ask primary MD office if the have any samples of medication.  RNCM advised patient to continue to take his medications as directed, check blood pressures daily and record.  RNCM called community health and wellness to attempt to schedule appointment for patient due to not having insurance. Spoke with  Santiago Glad at [employer] and wellness who stated as of today 04/22/15 they are not accepting any new patients. Dimas Chyle stated she is unsure when they will open up for new patients in the future.   ASSESSMENT: EMMI stroke follow up  PLAN: RNCM will contact Dr. Nedra Hai office to see if they will allow patient to be seen for follow up without insurance. RNCM will mail patient  financial assistance and discount program application to be completed. RNCM will follow up with patient within 1 week.   Quinn Plowman RN,BSN,CCM [place] Coordinator [phone]

## 2015-04-22 NOTE — Telephone Encounter (Signed)
Pt called and needs a refill on his Plavix and Amitriptyline called in. jw

## 2015-04-23 ENCOUNTER — Other Ambulatory Visit: Payer: Self-pay

## 2015-04-23 ENCOUNTER — Telehealth: Payer: Self-pay | Admitting: Family Medicine

## 2015-04-23 DIAGNOSIS — I6302 Cerebral infarction due to thrombosis of basilar artery: Secondary | ICD-10-CM

## 2015-04-23 NOTE — Telephone Encounter (Signed)
Social Worker from [employer] called and would like the doctor to put in a referral for the patient to be seen at [facility]. She is working on the patient case and getting him set up with [employer] financial Aide. jw

## 2015-04-23 NOTE — Telephone Encounter (Signed)
Referral to Neuro Rehab placed in EPIC.

## 2015-04-23 NOTE — Patient Outreach (Signed)
[facility]) Care Management  [facility] Care Manager  04/23/2015   Aaron Kennedy Nov 06, 1970 [id number]  Subjective: Telephone call to Dr. Versie Starks office to schedule patient appointment.  Spoke with [name] with Dr. Versie Starks office who informed RNCM ok to schedule patient for follow up appointment.  Several appointment times offered. RNCM requested physical therapy order for patient to [facility].  [name] states she will notify doctor of request.    RNCM contacted patient who confirmed that Friday, May 02, 2015 at 9:30am would be the accepted appointment time.  RNCM contacted Dr. Versie Starks office back and spoke with [name] who scheduled and confirmed patients appointment for Friday, May 02, 2015 at 9:30 am.   Objective: n/a  Current Medications:  Current Outpatient Prescriptions  Medication Sig Dispense Refill  . amitriptyline (ELAVIL) 50 MG tablet TAKE THREE TABLETS BY MOUTH AT BEDTIME. 90 tablet 5  . atorvastatin (LIPITOR) 40 MG tablet Take 1 tablet (40 mg total) by mouth daily at 6 PM. 30 tablet 0  . clopidogrel (PLAVIX) 75 MG tablet Take 1 tablet (75 mg total) by mouth daily. 90 tablet 1  . gabapentin (NEURONTIN) 600 MG tablet Take 600-1,200 mg by mouth daily as needed (leg pain).     Marland Kitchen lisinopril (PRINIVIL,ZESTRIL) 10 MG tablet Take 1 tablet (10 mg total) by mouth daily. 30 tablet 0  . metoprolol succinate (TOPROL-XL) 25 MG 24 hr tablet Take 1 tablet (25 mg total) by mouth daily. 30 tablet 0  . nicotine polacrilex (NICORETTE) 2 MG gum Take 1 each (2 mg total) by mouth as needed for smoking cessation. (Patient not taking: Reported on 03/21/2015) 100 tablet 0   No current facility-administered medications for this visit.    Functional Status:  In your present state of health, do you have any difficulty performing the following activities: 04/22/2015 03/21/2015  Hearing? N N  Vision? N N  Difficulty concentrating or making decisions? N N  Walking or climbing  stairs? Y N  Dressing or bathing? N N  Doing errands, shopping? N N  Preparing Food and eating ? N -  Using the Toilet? N -  In the past six months, have you accidently leaked urine? N -  Do you have problems with loss of bowel control? N -  Managing your Medications? N -  Managing your Finances? N -  Housekeeping or managing your Housekeeping? N -    Fall/Depression Screening: PHQ 2/9 Scores 04/22/2015 04/03/2015  PHQ - 2 Score 1 0    Assessment: [facility] contact number [phone], FAX number:  [phone]  Plan: RNCM will follow up with [facility] within 3 business days to confirm they have received order for patients  Therapy and determine scheduled appointment. RNCM will follow up with patient once therapy order and appointment confirmed.   Aaron Ina RN,BSN,CCM [facility] Telephonic Care Coordinator [phone]

## 2015-04-24 ENCOUNTER — Ambulatory Visit: Payer: Self-pay | Admitting: Family Medicine

## 2015-04-30 ENCOUNTER — Ambulatory Visit: Payer: Self-pay

## 2015-05-02 ENCOUNTER — Other Ambulatory Visit: Payer: Self-pay

## 2015-05-02 ENCOUNTER — Encounter: Payer: Self-pay | Admitting: Family Medicine

## 2015-05-02 ENCOUNTER — Ambulatory Visit (INDEPENDENT_AMBULATORY_CARE_PROVIDER_SITE_OTHER): Payer: Self-pay | Admitting: Family Medicine

## 2015-05-02 VITALS — BP 142/88 | HR 76 | Temp 98.5°F | Ht 71.0 in | Wt 199.9 lb

## 2015-05-02 DIAGNOSIS — F17200 Nicotine dependence, unspecified, uncomplicated: Secondary | ICD-10-CM

## 2015-05-02 DIAGNOSIS — I6330 Cerebral infarction due to thrombosis of unspecified cerebral artery: Secondary | ICD-10-CM

## 2015-05-02 DIAGNOSIS — I739 Peripheral vascular disease, unspecified: Secondary | ICD-10-CM | POA: Insufficient documentation

## 2015-05-02 DIAGNOSIS — I10 Essential (primary) hypertension: Secondary | ICD-10-CM

## 2015-05-02 NOTE — Assessment & Plan Note (Signed)
Current smoker. Encouraged cessation. Declined pharmacotherapy.

## 2015-05-02 NOTE — Assessment & Plan Note (Signed)
Hospital follow up for right arm numbness/weakness (related to previous CVA). Symptoms stable. -no change in therapy, continue Plavix and Lipitor -referral to neurology per patient request

## 2015-05-02 NOTE — Patient Outreach (Addendum)
[facility]) Care Management  05/02/2015  Aaron Kennedy April 18, 1971 [id number]   SUBJECTIVE; Telephone call to patient regarding EMMI stroke program follow up. HIPAA verified with patient. Patient states he received his [place] financial assistance papers in the mail from [facility]. Patient states he called the financial business office at [facility] health to confirm that he could bring his financial assistance paperwork there and someone will help him complete it.  Patient states he had his follow up appointment with Dr. Randolm Idol.  Patient states his appointment went well. Denies any changes being made to his treatment plan. Patient states his doctor said he could do as much as tolerated related to his activities.  Patient states he spoke further with his primary MD regarding physical therapy.  Patient states his doctor was in agreement that therapy would be beneficial. Patient denies any unusual symptoms.  Patient states he is doing well.  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   RNCM contacted [facility] and spoke with Kizzy. Kizzy states she has the order from Dr. Randolm Idol for patient but she will have to follow up with Dr. Randolm Idol regarding which therapy is needed. Kizzy states once this information is received patient will be contacted by them to set up therapy appointment.   RNCM called patient back to notify him that order has been received at [facility] and he should be expecting a call from them to set up appointment for therapy. Patient verbalized understanding and appreciation.   ASSESSMENT: EMMI stroke transition program.  Patient continues to self manage care.  Patient awaiting appointment time for therapy to begin with [place] [facility].   PLAN; RNCM will follow up with patient within 1 week. RNCM will follow up with [facility] to confirm that order for specific therapy was received.   [name] RN,BSN,CCM [facility] Telephonic Care Coordinator [phone]

## 2015-05-02 NOTE — Patient Instructions (Signed)
It was nice to see you today.  Blood pressure - well controlled, continue Toprol and Lisinopril  Stroke - continue Plavix and Lipitor  Smoking - continue to work to stop smoking  Return to office in 3 months.

## 2015-05-02 NOTE — Assessment & Plan Note (Addendum)
Blood pressure slightly elevated today. Home BP's well controlled. -continue Lisinopril 10 mg daily and Toprol 25 mg daily -Cr stable during recent ED visit

## 2015-05-02 NOTE — Progress Notes (Signed)
   Subjective:    Patient ID: Aaron Kennedy, male    DOB: October 17, 1970, 45 y.o.   MRN: [id number]  HPI 45 y/o male presents for hospital follow up. Seen in ED on 12/20 for chest pain/right sided weakness/cold right foot. Reviewed ED note/Neurology consult note/Vascular surgery consult note.   HTN - home readings 120/70's, still taking Lisinopril and Toprol, no further chest pain, occasional headache relieved with tylenol, no vision changes  CVA - still taking Plavix daily and Lipitor 40 mg daily, also taking Gabapentin 1200 TID for right foot pain/stiffness, evaluated by Neurologist in the ED, continue medical management; need neurology follow up.   Occlusion right common iliac - seen by Vascular Surgery during ED visit, has reconstitution of the right external iliac via collaterals, Vascular surgery did not plan to follow up as outpatient, continue Plavix.   Tobacco - smoking (1 pack will last 3 days), not using the nicorrette.  Social - working with get Disability/Medicaid.     Review of Systems  Constitutional: Negative for fever, chills and fatigue.  Cardiovascular: Negative for chest pain and leg swelling.  Gastrointestinal: Negative for nausea, vomiting and diarrhea.       Objective:   Physical Exam Vitals: reviewed Gen: pleasant male, NAD Cardiac: RRR, S1 and S2, no murmur Resp: CTAB, normal effort Neuro: CN 2-12 intact, strength 4+/5 RUE/RLE, 5/5 on left side, sensation to light touch decreased on left upper and lower extremity Ext: no edema, unable to palpate right DP or PT pulse, left DP pulse 2+, both feet are warm and have normal capillary refill  CT Angio Right lower extremity: Atheromatous changes to the posterior wall common femoral artery without stenosis. The superficial femoral artery and profunda are widely patent. Widely patent popliteal artery and proximal anterior tibial, posterior tibial, and peroneal arteries. Gradual progressive attenuation of the  posterior tibial artery in the calf, with the peroneal artery serving the plantar arteries after crossing posterior to the ankle joint. Symmetric dorsalis pedis flow, faint due to contrast timing.  Reviewed labs from recent hospitalization.      Assessment & Plan:  HTN (hypertension) Blood pressure slightly elevated today. Home BP's well controlled. -continue Lisinopril 10 mg daily and Toprol 25 mg daily -Cr stable during recent ED visit  CVA (cerebral vascular accident) [facility]) Hospital follow up for right arm numbness/weakness (related to previous CVA). Symptoms stable. -no change in therapy, continue Plavix and Lipitor -referral to neurology per patient request  Tobacco use disorder Current smoker. Encouraged cessation. Declined pharmacotherapy.   PAD (peripheral artery disease) [facility]) Hospital follow up for "cold foot". 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 Other vessels in right LE patent.  -continue Plavix and Lipitor -no vascular follow up per Vascular Surgery during ED visit.

## 2015-05-02 NOTE — Assessment & Plan Note (Signed)
Hospital follow up for "cold foot". 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 Other vessels in right LE patent.  -continue Plavix and Lipitor -no vascular follow up per Vascular Surgery during ED visit.

## 2015-05-06 ENCOUNTER — Other Ambulatory Visit: Payer: Self-pay

## 2015-05-06 NOTE — Patient Outreach (Addendum)
[facility]) Care Management  05/06/2015  Aaron Kennedy June 12, 1970 [id number]  Care coordination call  To [facility] regarding follow up for patients therapy order.  Spoke with Angie at [facility] neuro rehab who states order was received from Dr. Randolm Idol for neurology referral but no order for specific therapy submitted.   RNCM contacted Dr. Versie Starks office and spoke with Crist Infante, referral coordinator.  [name] states she will contact Dr. Randolm Idol to get order for rehab.   Called returned from [name] stating she has received a physical therapy order for patient.  [name] states patient will be getting a call from [facility] to schedule appointment.   PLAN:  RNCM will follow up with patient at next scheduled outreach. RNCM will follow up with [facility] to assure that order was received.   [name] RN,BSN,CCM [facility] Telephonic Care Coordinator [phone]

## 2015-05-09 ENCOUNTER — Other Ambulatory Visit: Payer: Self-pay

## 2015-05-09 NOTE — Patient Outreach (Signed)
[facility]) Care Management  05/09/2015  Aaron Kennedy 05-22-1970 [id number]   Telephone call to Aaron Kennedy with [facility] to confirm order was received for patients physical therapy.  Aaron Kennedy states new order has not been received from primary office.  Telephone call to Dr. Versie Kennedy office. Spoke with Aaron Kennedy, referral coordinator who states order had been sent to [facility].   Telephone call to [facility] outpatient rehab.  Spoke with Aaron Kennedy who states order for physical therapy was in the computer system for patient and patient will be called for scheduling.  Advised RNCM to call back to confirm appointment.   ASSESSMENT: Care coordination for physical therapy.  PLAN: RNCM will follow up with [facility] to confirm therapy appointment for patient.   [name] RN,BSN,CCM [facility] Telephonic  [phone]

## 2015-05-12 ENCOUNTER — Telehealth: Payer: Self-pay | Admitting: Rehabilitative and Restorative Service Providers"

## 2015-05-12 ENCOUNTER — Encounter: Payer: Self-pay | Admitting: Rehabilitative and Restorative Service Providers"

## 2015-05-12 ENCOUNTER — Other Ambulatory Visit: Payer: Self-pay

## 2015-05-12 ENCOUNTER — Ambulatory Visit: Payer: Self-pay | Attending: Family Medicine | Admitting: Rehabilitative and Restorative Service Providers"

## 2015-05-12 DIAGNOSIS — G8191 Hemiplegia, unspecified affecting right dominant side: Secondary | ICD-10-CM | POA: Insufficient documentation

## 2015-05-12 DIAGNOSIS — R269 Unspecified abnormalities of gait and mobility: Secondary | ICD-10-CM | POA: Insufficient documentation

## 2015-05-12 DIAGNOSIS — R2681 Unsteadiness on feet: Secondary | ICD-10-CM | POA: Insufficient documentation

## 2015-05-12 NOTE — Telephone Encounter (Signed)
PT called [facility] case manager to discuss patient's need for OT and if there was financial coverage for PT sessions.  Patient is applying for financial coverage through financial services.   Will await determination of coverage to request OT order for R UE.

## 2015-05-12 NOTE — Patient Instructions (Signed)
Heel Raise: Unilateral (Standing)    Balance on left foot, then rise on ball of foot. Repeat 10 times per set. Do 3 sets per session. Do Toe Raise (Standing)    Rock back on heels. Repeat 10 times per set. Do 3 sets per session. Do 2 sessions per day.  http://orth.exer.us/42   Copyright  VHI. All rights reserved.  2 sessions per day.  http://orth.exer.us/40   Copyright  VHI. All rights reserved.
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
## 2015-05-12 NOTE — Patient Outreach (Signed)
[facility]) Care Management  05/12/2015  Aaron Kennedy Sep 20, 1970 [id number]  SUBJECTIVE;  Received telephone call from Margretta Ditty physical therapist with [facility] neuro rehab.  States patient was seen and evaluated for physical therapy today.  States patient has follow up visit scheduled for 05/19/15.  Trula Ore states patient was given home exercises.  States will evaluate patients need for occupational therapy.     PLAN: RNCM will follow up with patient within 1 week to confirm patient has submitted his financial assistance application with [employer].  RNCM will follow up with Margretta Ditty within 1 week.   [name] RN,BSN,CCM [facility] Telephonic  [phone]

## 2015-05-13 ENCOUNTER — Ambulatory Visit: Payer: Self-pay

## 2015-05-13 ENCOUNTER — Other Ambulatory Visit: Payer: Self-pay

## 2015-05-13 NOTE — Patient Outreach (Signed)
[facility]) Care Management  05/13/2015  Aaron Kennedy [birth year] [id number]   Telephone call to patient regarding EMMI stroke program follow up. Unable to reach patient. HIPAA compliant voice message left with call back phone number.   PLAN; RNCM will attempt 2nd telephone outreach to patient within 1 week.   [name] RN,BSN,CCM [facility] Telephonic  [phone]

## 2015-05-14 ENCOUNTER — Other Ambulatory Visit: Payer: Self-pay

## 2015-05-14 NOTE — Patient Outreach (Signed)
[facility]) Care Management  05/14/2015  Aaron Kennedy Oct 05, 1970 [id number]  SUBJECTIVE:  Telephone call to patient related to EMMI stroke referral.  HIPAA verified with patient. Patient states he had his first therapy visit on Monday 05/12/15.  Patient states his therapy visit went well and he has been doing his home therapy exercises. Patient reports he has not take his financial assistance form to [facility] yet. 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  Patient states he will take the form as soon as possible. Patient states he has another therapy appointment scheduled for 05/19/15 and will try to have the form turned in before this appointment.  Patient denies any unusual signs and symptoms. Patient states he is doing very well. Patient states he continues to take his medication as prescribed.  States he is obtaining his medication without difficulty at this time.  Patient states he is not eligible for disability and/or medicaid due to not having a W-2 and 1099.  Patient states he has not filed taxes in several years and he is paid in cash from his employer so he is therefore not eligible. RNCM advised patient to complete the  financial assistance form and turn into [facility].  Patient verbalized agreement.  RNCM advised patient to continue to monitor his blood pressures daily. RNCM advised patient to take his medications as prescribed.  RNCM advised patient to keep follow up appointment with doctor.  Advised patient to schedule follow up appointment with neurologist.   ASSESSMENT: EMMI stroke transition program  PLAN: RNCM will follow up with patient within 1 week.  Patient will report he has submitted the financial assistance form with [name].

## 2015-05-19 ENCOUNTER — Ambulatory Visit: Payer: Self-pay | Admitting: Rehabilitative and Restorative Service Providers"

## 2015-05-20 ENCOUNTER — Other Ambulatory Visit: Payer: Self-pay

## 2015-05-20 NOTE — Patient Outreach (Signed)
[facility]) Care Management  05/20/2015  Aaron Kennedy [birth year] [id number]  Telephone call to patient regarding EMMI stroke program follow up. Unable to reach patient. HIPAA compliant voice message left with call back phone number.   PLAN: RNCM will attempt 2nd telephone call to patient within 1 week.   [name] RN,BSN,CCM [facility] Telephonic  [phone]

## 2015-05-21 ENCOUNTER — Ambulatory Visit: Payer: Self-pay | Admitting: Cardiology

## 2015-05-21 ENCOUNTER — Other Ambulatory Visit: Payer: Self-pay | Admitting: Family Medicine

## 2015-05-22 ENCOUNTER — Ambulatory Visit: Payer: Self-pay | Attending: Family Medicine | Admitting: Physical Therapy

## 2015-05-22 ENCOUNTER — Other Ambulatory Visit: Payer: Self-pay

## 2015-05-22 NOTE — Patient Outreach (Signed)
[facility]) Care Management  05/22/2015  ARREN LAMINACK [birth year] [id number]   Second telephone call to patient regarding EMMI stroke follow up.  Unable to reach patient. HIPAA compliant voice message left with call back phone number.  RNCM contacted Margretta Ditty, Physical therapist.  Ms. Alben Spittle stated patient did not come in for his scheduled appointment on Monday February 30, 2017 but did come in later that day.  States patient verbalized he still wanted to keep follow up physical therapy visit.  Ms. Alben Spittle states she will follow up with [facility] if patient shows up for next scheduled therapy visit.  Ms. Alben Spittle informed by this RNCM that patient was advised again to submit financial assistance paperwork to [facility] health.  This RNCM has attempted to follow up with patient x 2 without success.   ASSESSMENT:  EMMI stroke follow up / care coordination  PLAN: RNCM will attempt 3rd telephone outreach to patient within 1 week.   [name] RN,BSN,CCM [facility] Telephonic  [phone]

## 2015-05-23 ENCOUNTER — Ambulatory Visit: Payer: Self-pay | Admitting: Physical Therapy

## 2015-05-27 ENCOUNTER — Other Ambulatory Visit: Payer: Self-pay

## 2015-05-27 ENCOUNTER — Ambulatory Visit: Payer: Self-pay | Admitting: Physical Therapy

## 2015-05-27 NOTE — Patient Outreach (Addendum)
[facility]) Care Management  05/27/2015  Aaron Kennedy [birth year] [id number]  SUBJECTIVE: Telephone call to patient for EMMI stroke program follow up.  HIPAA compliant voice message left with call back phone number. Patient states he has not turned in financial assistance application with [place]. Patient states he received a call back from social services and he thinks he may be able to get Medicaid. Patient states, "I have turned in all of my paperwork and hopefully I'll be able to get it.  Patient states his physical therapy has been cancelled until he turns in the paperwork for financial assistance or receives Medicaid.  Patient states he continues to do well. Patient reports he is able to do some work.  Patient denies any unusual symptoms. Patient states, "I'm not having any problems." Patient expressed his appreciation to [facility] for following up with him.  RNCM advised patient to contact his primary provider for any additional health concerns. RNCM advised patient to schedule follow up appointment with neurologist. Patient states he has not scheduled follow up with neurologist because he does not want to "run up a bunch of bills." Patient states he is making sure he follows up with his primary MD.   ASSESSMENT: Financial hardship/assistance paperwork not submitted to [place] by patient  Several attempts made by [facility] and therapist advising patient to submit paperwork.  Patient has applied for Medicaid. Patient has returned to light duty work.  PLAN: RNCM will refer patient to Sherle Poe to close due to ending of EMMI stroke program RNCM will notify Dr. Roda Shutters of closure to [facility] program, no follow up appointment has been made with neurologist, and physical therapy not completed due to patients financial situation.  [name] RN,BSN,CCM [facility] Telephonic  [phone]

## 2015-05-30 ENCOUNTER — Ambulatory Visit: Payer: Self-pay | Admitting: Rehabilitative and Restorative Service Providers"

## 2015-06-03 ENCOUNTER — Ambulatory Visit: Payer: Self-pay | Admitting: Rehabilitative and Restorative Service Providers"

## 2015-06-06 ENCOUNTER — Ambulatory Visit: Payer: Self-pay | Admitting: Physical Therapy

## 2015-06-10 ENCOUNTER — Ambulatory Visit: Payer: Self-pay | Admitting: Physical Therapy

## 2015-06-13 ENCOUNTER — Ambulatory Visit: Payer: Self-pay | Admitting: Physical Therapy

## 2015-06-17 ENCOUNTER — Ambulatory Visit: Payer: Self-pay | Admitting: Rehabilitative and Restorative Service Providers"

## 2015-06-20 ENCOUNTER — Ambulatory Visit: Payer: Self-pay | Admitting: Physical Therapy

## 2015-07-11 ENCOUNTER — Ambulatory Visit: Payer: Self-pay | Admitting: Neurology

## 2015-07-14 ENCOUNTER — Encounter: Payer: Self-pay | Admitting: Neurology

## 2015-07-21 ENCOUNTER — Encounter: Payer: Self-pay | Admitting: Rehabilitative and Restorative Service Providers"

## 2015-07-21 NOTE — Therapy (Signed)
[facility] [address] [place], [place], [address] Phone: [phone]   Fax:  [phone]  Patient Details  Name: Aaron Kennedy MRN: [id number] Date of Birth: Nov 22, 1970 Referring Provider:  No ref. provider found  Encounter Date: last encounter 05/12/2015  PHYSICAL THERAPY DISCHARGE SUMMARY  Visits from Start of Care: 1 (eval only)  Current functional level related to goals / functional outcomes: See initial eval from 05/12/15--patient did not return.   Remaining deficits: See initial evaluation   Education / Equipment: HEP provided  Plan: Patient agrees to discharge.  Patient goals were not met. Patient is being discharged due to not returning since the last visit.  ?????       Thank you for the referral of this patient. Rudell Cobb, MPT    Aaron Kennedy 07/21/2015, 11:13 AM  [facility] [address] [place], [place], [address] Phone: [phone]   Fax:  [phone]

## 2015-08-27 ENCOUNTER — Other Ambulatory Visit: Payer: Self-pay | Admitting: Family Medicine

## 2015-08-29 ENCOUNTER — Other Ambulatory Visit: Payer: Self-pay | Admitting: *Deleted

## 2015-08-29 MED ORDER — METOPROLOL SUCCINATE ER 25 MG PO TB24: 25.0000 mg | ORAL_TABLET | Freq: Every day | ORAL | Status: DC

## 2015-09-26 ENCOUNTER — Ambulatory Visit (INDEPENDENT_AMBULATORY_CARE_PROVIDER_SITE_OTHER): Payer: No Typology Code available for payment source | Admitting: Family Medicine

## 2015-09-26 ENCOUNTER — Encounter: Payer: Self-pay | Admitting: Family Medicine

## 2015-09-26 VITALS — BP 138/96 | HR 67 | Temp 98.3°F | Resp 16 | Ht 71.0 in | Wt 229.0 lb

## 2015-09-26 DIAGNOSIS — F17200 Nicotine dependence, unspecified, uncomplicated: Secondary | ICD-10-CM

## 2015-09-26 DIAGNOSIS — R7303 Prediabetes: Secondary | ICD-10-CM

## 2015-09-26 DIAGNOSIS — I10 Essential (primary) hypertension: Secondary | ICD-10-CM

## 2015-09-26 DIAGNOSIS — K029 Dental caries, unspecified: Secondary | ICD-10-CM

## 2015-09-26 DIAGNOSIS — G8929 Other chronic pain: Secondary | ICD-10-CM

## 2015-09-26 DIAGNOSIS — E669 Obesity, unspecified: Secondary | ICD-10-CM

## 2015-09-26 DIAGNOSIS — M25561 Pain in right knee: Secondary | ICD-10-CM

## 2015-09-26 DIAGNOSIS — Z6831 Body mass index (BMI) 31.0-31.9, adult: Secondary | ICD-10-CM

## 2015-09-26 DIAGNOSIS — Z23 Encounter for immunization: Secondary | ICD-10-CM

## 2015-09-26 DIAGNOSIS — Z8673 Personal history of transient ischemic attack (TIA), and cerebral infarction without residual deficits: Secondary | ICD-10-CM

## 2015-09-26 LAB — LIPID PANEL
Cholesterol: 208 mg/dL — ABNORMAL HIGH (ref 125–200)
HDL: 31 mg/dL — AB (ref >=40)
LDL CALC: 149 mg/dL — AB (ref <130)
Total CHOL/HDL Ratio: 6.7 Ratio — ABNORMAL HIGH (ref <=5.0)
Triglycerides: 140 mg/dL (ref <150)
VLDL: 28 mg/dL (ref <30)

## 2015-09-26 LAB — COMPLETE METABOLIC PANEL WITH GFR
ALT: 20 U/L (ref 9–46)
AST: 14 U/L (ref 10–40)
Albumin: 4.1 g/dL (ref 3.6–5.1)
Alkaline Phosphatase: 105 U/L (ref 40–115)
BUN: 10 mg/dL (ref 7–25)
CHLORIDE: 105 mmol/L (ref 98–110)
CO2: 23 mmol/L (ref 20–31)
CREATININE: 0.91 mg/dL (ref 0.60–1.35)
Calcium: 8.8 mg/dL (ref 8.6–10.3)
GFR, Est African American: >89 mL/min (ref >=60)
GFR, Est Non African American: >89 mL/min (ref >=60)
GLUCOSE: 83 mg/dL (ref 65–99)
Potassium: 4.4 mmol/L (ref 3.5–5.3)
SODIUM: 141 mmol/L (ref 135–146)
Total Bilirubin: 0.5 mg/dL (ref 0.2–1.2)
Total Protein: 6.7 g/dL (ref 6.1–8.1)

## 2015-09-26 LAB — POCT URINALYSIS DIP (DEVICE)
BILIRUBIN URINE: NEGATIVE
GLUCOSE, UA: NEGATIVE mg/dL
Hgb urine dipstick: NEGATIVE
Ketones, ur: NEGATIVE mg/dL
LEUKOCYTES UA: NEGATIVE
NITRITE: NEGATIVE
PH: 6 (ref 5.0–8.0)
Protein, ur: NEGATIVE mg/dL
Specific Gravity, Urine: 1.02 (ref 1.005–1.030)
Urobilinogen, UA: 1 mg/dL (ref 0.0–1.0)

## 2015-09-26 LAB — CBC WITH DIFFERENTIAL/PLATELET
BASOS ABS: 0 {cells}/uL (ref 0–200)
BASOS PCT: 0 %
EOS ABS: 180 {cells}/uL (ref 15–500)
EOS PCT: 2 %
HCT: 45.7 % (ref 38.5–50.0)
Hemoglobin: 15.8 g/dL (ref 13.2–17.1)
LYMPHS ABS: 3240 {cells}/uL (ref 850–3900)
Lymphocytes Relative: 36 %
MCH: 33.2 pg — ABNORMAL HIGH (ref 27.0–33.0)
MCHC: 34.6 g/dL (ref 32.0–36.0)
MCV: 96 fL (ref 80.0–100.0)
MONO ABS: 720 {cells}/uL (ref 200–950)
MPV: 9.5 fL (ref 7.5–12.5)
Monocytes Relative: 8 %
NEUTROS ABS: 4860 {cells}/uL (ref 1500–7800)
NEUTROS PCT: 54 %
PLATELETS: 218 10*3/uL (ref 140–400)
RBC: 4.76 MIL/uL (ref 4.20–5.80)
RDW: 14 % (ref 11.0–15.0)
WBC: 9 10*3/uL (ref 3.8–10.8)

## 2015-09-26 LAB — HEMOGLOBIN A1C
Hgb A1c MFr Bld: 6.2 % — ABNORMAL HIGH (ref <5.7)
Mean Plasma Glucose: 131 mg/dL

## 2015-09-26 LAB — TSH: TSH: 1.13 m[IU]/L (ref 0.40–4.50)

## 2015-09-26 MED ORDER — KETOROLAC TROMETHAMINE 60 MG/2ML IM SOLN
30.0000 mg | Freq: Once | INTRAMUSCULAR | Status: AC
  Administered 2015-09-26: 30 mg via INTRAMUSCULAR

## 2015-09-26 NOTE — Progress Notes (Signed)
Subjective:    Patient ID: Aaron Kennedy, male    DOB: 1970-08-30, 45 y.o.   MRN: [id number]  HPI Mr. Aaron Kennedy, a history of stroke presents to establish care. Mr. Aaron Kennedy was a patient patient of Dr. Donnella Sham, but has been lost to follow-up due to insurance constraints. Mr. Aaron Kennedy had a CVA in November 2016. 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 He did not complete therapy and has been unable to follow up with neurology due to lack of payer source. Mr. Aaron Kennedy has a history of hypertension and prediabetes. He is not exercising and is not adherent to low salt diet.  Blood pressure is well controlled at home.  Patient denies chest pain, dyspnea, fatigue, near-syncope, orthopnea, palpitations, syncope and tachypnea.  Cardiovascular risk factors includes: obesity (BMI >= 30 kg/m2), sedentary lifestyle and smoking/ tobacco exposure..  Mr. Aaron Kennedy is also complaining of right knee pain. Knee pain has been present for 3 months. He says that he has not attempted any OTC interventions to alleviate current symptoms. Current symptoms include pain located right anterior knee. Pain is aggravated by inactivity, kneeling, lateral movements, rising after sitting, squatting and standing.  Patient has had no prior knee problems. Past Medical History  Diagnosis Date  . Stroke [facility])    There is no immunization history on file for this patient.  No Known Allergies  Past Surgical History  Procedure Laterality Date  . Hernia repair    . Leg surgery     Social History   Social History  . Marital Status: Single    Spouse Name: N/A  . Number of Children: N/A  . Years of Education: N/A   Occupational History  . Not on file.   Social History Main Topics  . Smoking status: Current Every Day Smoker -- 0.25 packs/day for 20 years    Types: Cigarettes  . Smokeless tobacco: Not on file  . Alcohol Use: Yes  . Drug Use: Yes    Special: Marijuana  . Sexual Activity: Not on file    Other Topics Concern  . Not on file   Social History Narrative   Review of Systems  Constitutional: Negative.   HENT: Negative.   Eyes: Negative.  Negative for photophobia and visual disturbance.  Respiratory: Negative.   Cardiovascular: Negative.   Gastrointestinal: Negative.   Endocrine: Negative.  Negative for polydipsia, polyphagia and polyuria.  Genitourinary: Negative.  Negative for flank pain.  Musculoskeletal: Positive for myalgias. Negative for joint swelling.  Skin: Negative.   Allergic/Immunologic: Negative.  Negative for immunocompromised state.  Neurological: Negative.  Negative for dizziness.  Hematological: Negative.   Psychiatric/Behavioral: Negative.        Objective:   Physical Exam  Constitutional: He is oriented to person, place, and time. He appears well-developed and well-nourished.  HENT:  Head: Normocephalic and atraumatic.  Right Ear: External ear normal.  Left Ear: External ear normal.  Nose: Nose normal.  Mouth/Throat: Oropharynx is clear and moist.  Eyes: Conjunctivae and EOM are normal. Pupils are equal, round, and reactive to light.  Neck: Normal range of motion. Neck supple.  Cardiovascular: Normal rate, regular rhythm, normal heart sounds and intact distal pulses.   Pulmonary/Chest: Effort normal and breath sounds normal.  Abdominal: Soft. Bowel sounds are normal.  Musculoskeletal:       Right knee: He exhibits decreased range of motion and swelling. Tenderness found. Lateral joint line tenderness noted. No patellar tendon tenderness noted.  Neurological: He is  alert and oriented to person, place, and time. He has normal reflexes.  Skin: Skin is warm.  Psychiatric: He has a normal mood and affect. His behavior is normal. Judgment and thought content normal.      BP 138/96 mmHg  Pulse 67  Temp(Src) 98.3 F (36.8 C) (Oral)  Resp 16  Ht 5\' 11"  (1.803 m)  Wt 229 lb (103.874 kg)  BMI 31.95 kg/m2  SpO2 97% Assessment & Plan:  1.  Essential hypertension Blood pressure is at goal on current medication regimen, will continue at current dosages.  - Urinalysis Dipstick - COMPLETE METABOLIC PANEL WITH GFR - EKG 69-SWNI  2. Prediabetes Previous hemoglobin a1C was 6.0, will recheck hemoglobin a1C.  - Urinalysis Dipstick - COMPLETE METABOLIC PANEL WITH GFR - Hemoglobin A1c  3. History of CVA (cerebrovascular accident) - Lipid Panel  4. Obesity Recommend a lowfat, low carbohydrate diet divided over 5-6 small meals, increase water intake to 6-8 glasses, and 150 minutes per week of cardiovascular exercise.   - TSH - CBC with Differential  5. BMI 31.0-31.9,adult Recommend a lowfat, low carbohydrate diet divided over 5-6 small meals, increase water intake to 6-8 glasses, and 150 minutes per week of cardiovascular exercise.    6. Dental caries - Ambulatory referral to Dentistry  7. Knee pain, chronic, right - ketorolac (TORADOL) injection 30 mg; Inject 1 mL (30 mg total) into the muscle once.  8. Immunization due - Pneumococcal polysaccharide vaccine 23-valent greater than or equal to 2yo subcutaneous/IM  9. Tobacco use disorder Smoking cessation instruction/counseling given:  counseled patient on the dangers of tobacco use, advised patient to stop smoking, and reviewed strategies to maximize success     RTC: 3 months for hypertension and prediabetes   Hollis,Lachina M, FNP

## 2015-09-26 NOTE — Patient Instructions (Addendum)
Knee pain: Cool compresses to right knee 20 minutes 4 times per day/use interchangeably with warm compresses  Tylenol 500 mg every 6 hours as needed for mild to moderate pain.  Will continue anti-hypertensive medications.    Will follow up by phone with laboratory values

## 2015-09-29 ENCOUNTER — Telehealth: Payer: Self-pay

## 2015-09-29 NOTE — Telephone Encounter (Signed)
Called and spoke with patient, advised of labs and the need to start lipitor  daily. Patient verbalized understanding and states he will pick up rx and start today. Advised of hgba1c and need to stick to low fat/ low carb diet. Patient verbalized understanding and will work on this. Thanks!

## 2015-09-29 NOTE — Telephone Encounter (Signed)
-----   Message from Massie Maroon, [place] sent at 09/29/2015  9:35 AM EDT ----- Regarding: lab results Please inform patient that cholesterol is 208, goal is < 200 and LDL is 149, goal is < 100. I sent Lipitor 40 mg to [facility]. Hemoglobin a1C has increased to 6.2, will not start medication at this time. Recommend a lowfat, low carbohydrate diet divided over 5-6 small meals, increase water intake to 6-8 glasses, and 150 minutes per week of cardiovascular exercise.   Thanks  ----- Message -----    From: Lab in [facility] Interface    Sent: 09/26/2015  11:27 PM      To: Massie Maroon, FNP

## 2015-09-30 ENCOUNTER — Other Ambulatory Visit: Payer: Self-pay | Admitting: *Deleted

## 2015-10-06 ENCOUNTER — Other Ambulatory Visit: Payer: Self-pay

## 2015-10-06 MED ORDER — CLOPIDOGREL BISULFATE 75 MG PO TABS: 75.0000 mg | ORAL_TABLET | Freq: Every day | ORAL | Status: DC

## 2015-10-06 MED ORDER — ATORVASTATIN CALCIUM 40 MG PO TABS: 40.0000 mg | ORAL_TABLET | Freq: Every day | ORAL | Status: DC

## 2015-10-06 MED ORDER — GABAPENTIN 600 MG PO TABS: 1200.0000 mg | ORAL_TABLET | Freq: Every day | ORAL | Status: DC

## 2015-10-06 MED ORDER — LISINOPRIL 10 MG PO TABS: 10.0000 mg | ORAL_TABLET | Freq: Every day | ORAL | Status: DC

## 2015-10-06 MED ORDER — METOPROLOL SUCCINATE ER 25 MG PO TB24: 25.0000 mg | ORAL_TABLET | Freq: Every day | ORAL | Status: DC

## 2015-10-06 MED ORDER — AMITRIPTYLINE HCL 50 MG PO TABS: ORAL_TABLET | ORAL | Status: DC

## 2015-10-06 MED FILL — METOPROLOL SUCC ER 25 MG TA: 25 | 30 days supply | Qty: 30 | Fill #0

## 2015-10-06 MED FILL — LISINOPRIL 10 MG TABLET: 10 | 30 days supply | Qty: 30 | Fill #0

## 2015-10-06 MED FILL — CLOPIDOGREL 75 MG TABLET: 75 | 30 days supply | Qty: 30 | Fill #0

## 2015-10-06 MED FILL — GABAPENTIN 600 MG TABLET: 600 | 30 days supply | Qty: 60 | Fill #0

## 2015-10-06 NOTE — Telephone Encounter (Signed)
REFILLS SENT INTO PHARMACY. THANKS!  

## 2015-10-16 MED FILL — AMITRIPTYLINE HCL 150 MG TA: 150 | 30 days supply | Qty: 30 | Fill #0

## 2015-10-16 MED FILL — ATORVASTATIN 40 MG TABLET: 40 | 30 days supply | Qty: 30 | Fill #0

## 2015-11-18 MED FILL — LISINOPRIL 10 MG TABLET: 10 | 30 days supply | Qty: 30 | Fill #1

## 2015-11-18 MED FILL — ATORVASTATIN 40 MG TABLET: 40 | 30 days supply | Qty: 30 | Fill #1

## 2015-11-18 MED FILL — CLOPIDOGREL 75 MG TABLET: 75 | 30 days supply | Qty: 30 | Fill #1

## 2015-11-18 MED FILL — GABAPENTIN 600 MG TABLET: 600 | 30 days supply | Qty: 60 | Fill #1

## 2015-11-18 MED FILL — METOPROLOL SUCC ER 25 MG TA: 25 | 30 days supply | Qty: 30 | Fill #1

## 2015-12-29 ENCOUNTER — Ambulatory Visit (INDEPENDENT_AMBULATORY_CARE_PROVIDER_SITE_OTHER): Payer: No Typology Code available for payment source | Admitting: Family Medicine

## 2015-12-29 ENCOUNTER — Encounter: Payer: Self-pay | Admitting: Family Medicine

## 2015-12-29 VITALS — BP 136/90 | HR 67 | Temp 97.9°F | Resp 16 | Ht 71.0 in | Wt 232.0 lb

## 2015-12-29 DIAGNOSIS — Z23 Encounter for immunization: Secondary | ICD-10-CM

## 2015-12-29 DIAGNOSIS — M25561 Pain in right knee: Secondary | ICD-10-CM

## 2015-12-29 MED ORDER — ATORVASTATIN CALCIUM 40 MG PO TABS: 40.0000 mg | ORAL_TABLET | Freq: Every day | ORAL | 3 refills | Status: DC

## 2015-12-29 MED ORDER — METOPROLOL SUCCINATE ER 25 MG PO TB24: 25.0000 mg | ORAL_TABLET | Freq: Every day | ORAL | 2 refills | Status: DC

## 2015-12-29 MED ORDER — LISINOPRIL 10 MG PO TABS: 10.0000 mg | ORAL_TABLET | Freq: Every day | ORAL | 2 refills | Status: DC

## 2015-12-29 MED ORDER — KETOROLAC TROMETHAMINE 30 MG/ML IJ SOLN
30.0000 mg | Freq: Once | INTRAMUSCULAR | Status: AC
  Administered 2015-12-29: 30 mg via INTRAMUSCULAR

## 2015-12-29 MED ORDER — KETOROLAC TROMETHAMINE 30 MG/ML IJ SOLN: 30.0000 mg | Freq: Once | INTRAMUSCULAR | Status: DC

## 2015-12-29 MED FILL — ATORVASTATIN 40 MG TABLET: 40 | 30 days supply | Qty: 30 | Fill #0

## 2015-12-29 MED FILL — METOPROLOL SUCC ER 25 MG TA: 25 | 30 days supply | Qty: 30 | Fill #0

## 2015-12-29 MED FILL — ?LISINOPRIL 10 MG TABLET: 10 | 30 days supply | Qty: 30 | Fill #0

## 2015-12-29 NOTE — Progress Notes (Signed)
Aaron Kennedy, is a 45 y.o. male  ZDG:[id number]  [id number]:[id number]  DOB - [birth year]  CC:  Chief Complaint  Patient presents with  . Hypertension       HPI: Aaron Kennedy is a 45 y.o. male here for follow-up chronic conditions. He has a history of HTN and is on lisinopril and metoprolol. He had a CVA in November 2016 and is on Plavix. He has right leg pain and weakness from the stroke and is on gabapentin. He is on atorvostatin for hypercholesterolemia. He reports following a low salt diet but does not exercise regularly. His only complaint is of  Right knee pain.   No Known Allergies Past Medical History:  Diagnosis Date  . Stroke [facility])    Current Outpatient Prescriptions on File Prior to Visit  Medication Sig Dispense Refill  . amitriptyline (ELAVIL) 50 MG tablet TAKE THREE TABLETS BY MOUTH AT BEDTIME. 90 tablet 5  . clopidogrel (PLAVIX) 75 MG tablet Take 1 tablet (75 mg total) by mouth daily. 90 tablet 1  . gabapentin (NEURONTIN) 600 MG tablet Take 2 tablets (1,200 mg total) by mouth daily. 60 tablet 3  . nicotine polacrilex (NICORETTE) 2 MG gum Take 1 each (2 mg total) by mouth as needed for smoking cessation. (Patient not taking: Reported on 12/29/2015) 100 tablet 0   No current facility-administered medications on file prior to visit.    Family History  Problem Relation Age of Onset  . Stroke Brother     4s  . Heart attack Sister     34s   Social History   Social History  . Marital status: Single    Spouse name: N/A  . Number of children: N/A  . Years of education: N/A   Occupational History  . Not on file.   Social History Main Topics  . Smoking status: Current Every Day Smoker    Packs/day: 0.50    Years: 20.00    Types: Cigarettes  . Smokeless tobacco: Never Used  . Alcohol use No  . Drug use:     Types: Marijuana  . Sexual activity: Not on file   Other Topics Concern  . Not on file   Social History Narrative  . No narrative on file     Review of Systems: Constitutional: Negative Skin: Negative HENT: Negative  Eyes: Negative  Neck: Negative Respiratory: Reports occ SOB with exertion Cardiovascular: Negative Gastrointestinal: Positive for occ heartburn Genitourinary: Negative  Musculoskeletal: Positive for knee pain  Neurological: Negative for Hematological: Negative  Psychiatric/Behavioral: Negative    Objective:   Vitals:   12/29/15 0822  BP: (!) 141/97  Pulse: 67  Resp: 16  Temp: 97.9 F (36.6 C)    Physical Exam: Constitutional: Patient appears well-developed and well-nourished. No distress. HENT: Normocephalic, atraumatic, External right and left ear normal. Oropharynx is clear and moist.  Eyes: Conjunctivae and EOM are normal. PERRLA, no scleral icterus. Neck: Normal ROM. Neck supple. No lymphadenopathy, No thyromegaly. CVS: RRR, S1/S2 +, no murmurs, no gallops, no rubs Pulmonary: Effort and breath sounds normal, no stridor, rhonchi, wheezes, rales.  Abdominal: Soft. Normoactive BS,, no distension, tenderness, rebound or guarding.  Musculoskeletal: Normal range of motion. No edema and no tenderness.  Neuro: Alert.Normal muscle tone coordination. Non-focal Skin: Skin is warm and dry. No rash noted. Not diaphoretic. No erythema. No pallor. Psychiatric: Normal mood and affect. Behavior, judgment, thought content normal.  Lab Results  Component Value Date   WBC 9.0 09/26/2015   HGB 15.8  09/26/2015   HCT 45.7 09/26/2015   MCV 96.0 09/26/2015   PLT 218 09/26/2015   Lab Results  Component Value Date   CREATININE 0.91 09/26/2015   BUN 10 09/26/2015   NA 141 09/26/2015   K 4.4 09/26/2015   CL 105 09/26/2015   CO2 23 09/26/2015    Lab Results  Component Value Date   HGBA1C 6.2 (H) 09/26/2015   Lipid Panel     Component Value Date/Time   CHOL 208 (H) 09/26/2015 1406   TRIG 140 09/26/2015 1406   HDL 31 (L) 09/26/2015 1406   CHOLHDL 6.7 (H) 09/26/2015 1406   VLDL 28 09/26/2015 1406    LDLCALC 149 (H) 09/26/2015 1406       Assessment and plan:   1. Need for prophylactic vaccination and inoculation against influenza  - Flu Vaccine QUAD 36+ mos PF IM (Fluarix & Fluzone Quad PF)  2. Right knee pain  - ketorolac (TORADOL) 30 MG/ML injection 30 mg; Inject 1 mL (30 mg total) into the muscle once.  3. Hypertension -Refill of lisinopril -Refill of metoprolol -low salt diet.  4. Leg Pain -refill gabapentin  5. Hypercholesterol -Refill atorvostatin.   No Follow-up on file.  The patient was given clear instructions to go to ER or return to medical center if symptoms don't improve, worsen or new problems develop. The patient verbalized understanding.    Henrietta HooverLinda C Cecilio Ohlrich FNP  12/29/2015, 8:54 AM

## 2016-01-02 MED FILL — AMITRIPTYLINE HCL 150 MG TA: 150 | 30 days supply | Qty: 30 | Fill #1

## 2016-01-02 MED FILL — CLOPIDOGREL 75 MG TABLET: 75 | 30 days supply | Qty: 30 | Fill #2

## 2016-01-02 MED FILL — GABAPENTIN 600 MG TABLET: 600 | 30 days supply | Qty: 60 | Fill #2

## 2016-02-09 MED FILL — METOPROLOL SUCC ER 25 MG TA: 25 | 30 days supply | Qty: 30 | Fill #1

## 2016-02-09 MED FILL — ATORVASTATIN 40 MG TABLET: 40 | 30 days supply | Qty: 30 | Fill #1

## 2016-02-09 MED FILL — LISINOPRIL 10 MG TABLET: 10 | 30 days supply | Qty: 30 | Fill #1

## 2016-02-09 MED FILL — GABAPENTIN 600 MG TABLET: 600 | 30 days supply | Qty: 60 | Fill #3

## 2016-02-09 MED FILL — AMITRIPTYLINE HCL 150 MG TA: 150 | 30 days supply | Qty: 30 | Fill #2

## 2016-02-09 MED FILL — CLOPIDOGREL 75 MG TABLET: 75 | 30 days supply | Qty: 30 | Fill #3

## 2016-03-15 ENCOUNTER — Other Ambulatory Visit: Payer: Self-pay | Admitting: Family Medicine

## 2016-03-15 MED FILL — CLOPIDOGREL 75 MG TABLET: 75 | 30 days supply | Qty: 30 | Fill #4

## 2016-03-15 MED FILL — METOPROLOL SUCC ER 25 MG TA: 25 | 30 days supply | Qty: 30 | Fill #2

## 2016-03-15 MED FILL — LISINOPRIL 10 MG TABLET: 10 | 30 days supply | Qty: 30 | Fill #2

## 2016-03-15 MED FILL — ATORVASTATIN 40 MG TABLET: 40 | 30 days supply | Qty: 30 | Fill #2

## 2016-03-15 MED FILL — ?GABAPENTIN 600 MG TABLET: 600 | 30 days supply | Qty: 60 | Fill #0

## 2016-04-15 ENCOUNTER — Telehealth: Payer: Self-pay

## 2016-04-15 MED ORDER — CLOPIDOGREL BISULFATE 75 MG PO TABS: 75.0000 mg | ORAL_TABLET | Freq: Every day | ORAL | 0 refills | Status: DC

## 2016-04-15 MED ORDER — ATORVASTATIN CALCIUM 40 MG PO TABS: 40.0000 mg | ORAL_TABLET | Freq: Every day | ORAL | 0 refills | Status: DC

## 2016-04-15 MED ORDER — LISINOPRIL 10 MG PO TABS: 10.0000 mg | ORAL_TABLET | Freq: Every day | ORAL | 0 refills | Status: DC

## 2016-04-15 MED ORDER — AMITRIPTYLINE HCL 50 MG PO TABS: ORAL_TABLET | ORAL | 0 refills | Status: DC

## 2016-04-15 MED ORDER — GABAPENTIN 600 MG PO TABS: 1200.0000 mg | ORAL_TABLET | Freq: Every day | ORAL | 0 refills | Status: DC

## 2016-04-15 MED ORDER — METOPROLOL SUCCINATE ER 25 MG PO TB24: 25.0000 mg | ORAL_TABLET | Freq: Every day | ORAL | 0 refills | Status: DC

## 2016-04-15 MED FILL — CLOPIDOGREL 75 MG TABLET: 75 | 30 days supply | Qty: 30 | Fill #0

## 2016-04-15 MED FILL — ?LISINOPRIL 10 MG TABLET: 10 | 30 days supply | Qty: 30 | Fill #0

## 2016-04-15 MED FILL — ?GABAPENTIN 600 MG TABLET: 600 | 30 days supply | Qty: 60 | Fill #0

## 2016-04-15 MED FILL — AMITRIPTYLINE HCL 50 MG TAB: 50 | 30 days supply | Qty: 90 | Fill #0

## 2016-04-15 MED FILL — ?ATORVASTATIN 40MG TABLET: 40 | 30 days supply | Qty: 30 | Fill #0

## 2016-04-15 MED FILL — METOPROLOL SUCC ER 25 MG TA: 25 | 30 days supply | Qty: 30 | Fill #0

## 2016-04-15 NOTE — Telephone Encounter (Signed)
Sent in 1 refill on all meds until patient comes to appointment in January. Thanks!

## 2016-04-22 ENCOUNTER — Encounter: Payer: Self-pay | Admitting: Family Medicine

## 2016-04-22 ENCOUNTER — Ambulatory Visit (INDEPENDENT_AMBULATORY_CARE_PROVIDER_SITE_OTHER): Payer: Self-pay | Admitting: Family Medicine

## 2016-04-22 VITALS — BP 162/92 | HR 70 | Temp 97.7°F | Resp 14 | Ht 72.0 in | Wt 235.0 lb

## 2016-04-22 DIAGNOSIS — I10 Essential (primary) hypertension: Secondary | ICD-10-CM

## 2016-04-22 DIAGNOSIS — F17200 Nicotine dependence, unspecified, uncomplicated: Secondary | ICD-10-CM

## 2016-04-22 DIAGNOSIS — M25561 Pain in right knee: Secondary | ICD-10-CM

## 2016-04-22 DIAGNOSIS — R7303 Prediabetes: Secondary | ICD-10-CM

## 2016-04-22 DIAGNOSIS — I6330 Cerebral infarction due to thrombosis of unspecified cerebral artery: Secondary | ICD-10-CM

## 2016-04-22 DIAGNOSIS — G8929 Other chronic pain: Secondary | ICD-10-CM | POA: Insufficient documentation

## 2016-04-22 LAB — CBC WITH DIFFERENTIAL/PLATELET
Basophils Absolute: 0 cells/uL (ref 0–200)
Basophils Relative: 0 %
EOS ABS: 180 {cells}/uL (ref 15–500)
Eosinophils Relative: 2 %
HCT: 48.8 % (ref 38.5–50.0)
Hemoglobin: 16.4 g/dL (ref 13.2–17.1)
LYMPHS PCT: 32 %
Lymphs Abs: 2880 cells/uL (ref 850–3900)
MCH: 33.1 pg — AB (ref 27.0–33.0)
MCHC: 33.6 g/dL (ref 32.0–36.0)
MCV: 98.4 fL (ref 80.0–100.0)
MPV: 9.8 fL (ref 7.5–12.5)
Monocytes Absolute: 720 cells/uL (ref 200–950)
Monocytes Relative: 8 %
NEUTROS PCT: 58 %
Neutro Abs: 5220 cells/uL (ref 1500–7800)
Platelets: 214 10*3/uL (ref 140–400)
RBC: 4.96 MIL/uL (ref 4.20–5.80)
RDW: 13.8 % (ref 11.0–15.0)
WBC: 9 10*3/uL (ref 3.8–10.8)

## 2016-04-22 LAB — COMPLETE METABOLIC PANEL WITH GFR
ALBUMIN: 4.2 g/dL (ref 3.6–5.1)
ALT: 17 U/L (ref 9–46)
AST: 12 U/L (ref 10–40)
Alkaline Phosphatase: 113 U/L (ref 40–115)
BUN: 9 mg/dL (ref 7–25)
CALCIUM: 8.8 mg/dL (ref 8.6–10.3)
CHLORIDE: 108 mmol/L (ref 98–110)
CO2: 18 mmol/L — ABNORMAL LOW (ref 20–31)
CREATININE: 0.96 mg/dL (ref 0.60–1.35)
GFR, Est African American: >89 mL/min (ref >=60)
GFR, Est Non African American: >89 mL/min (ref >=60)
Glucose, Bld: 120 mg/dL — ABNORMAL HIGH (ref 65–99)
Potassium: 4.3 mmol/L (ref 3.5–5.3)
Sodium: 138 mmol/L (ref 135–146)
Total Bilirubin: 0.4 mg/dL (ref 0.2–1.2)
Total Protein: 6.5 g/dL (ref 6.1–8.1)

## 2016-04-22 LAB — POCT URINALYSIS DIP (DEVICE)
Bilirubin Urine: NEGATIVE
Glucose, UA: 100 mg/dL — AB
HGB URINE DIPSTICK: NEGATIVE
Ketones, ur: NEGATIVE mg/dL
Leukocytes, UA: NEGATIVE
Nitrite: NEGATIVE
PH: 6 (ref 5.0–8.0)
PROTEIN: NEGATIVE mg/dL
SPECIFIC GRAVITY, URINE: 1.02 (ref 1.005–1.030)
UROBILINOGEN UA: 0.2 mg/dL (ref 0.0–1.0)

## 2016-04-22 LAB — POCT GLYCOSYLATED HEMOGLOBIN (HGB A1C): Hemoglobin A1C: 5.2

## 2016-04-22 MED ORDER — KETOROLAC TROMETHAMINE 60 MG/2ML IM SOLN
60.0000 mg | Freq: Once | INTRAMUSCULAR | Status: AC
  Administered 2016-04-22: 60 mg via INTRAMUSCULAR

## 2016-04-22 NOTE — Progress Notes (Signed)
Subjective:    Patient ID: Aaron Kennedy, male    DOB: 1970/09/22, 46 y.o.   MRN: [id number]  Hypertension  This is a chronic (Aaron Kennedy has a history of a CVA and uncontrolled hypertension. He has not been taking anti-hypertensive medications consistently due to financial constraints. ) problem. The current episode started more than 1 year ago. The problem is uncontrolled. Associated symptoms include headaches, malaise/fatigue and peripheral edema. Pertinent negatives include no anxiety, blurred vision, chest pain, neck pain, orthopnea, palpitations, PND, shortness of breath or sweats. There are no associated agents to hypertension. Risk factors for coronary artery disease include dyslipidemia, male gender, obesity, smoking/tobacco exposure and sedentary lifestyle. Past treatments include beta blockers and ACE inhibitors. The current treatment provides moderate improvement. Compliance problems include medication cost.  Hypertensive end-organ damage includes CVA. There is no history of angina, kidney disease, CAD/MI, heart failure, left ventricular hypertrophy, PVD, retinopathy or a thyroid problem.     Past Medical History:  Diagnosis Date  . Stroke [facility])    Social History   Social History  . Marital status: Single    Spouse name: N/A  . Number of children: N/A  . Years of education: N/A   Occupational History  . Not on file.   Social History Main Topics  . Smoking status: Current Every Day Smoker    Packs/day: 0.50    Years: 20.00    Types: Cigarettes  . Smokeless tobacco: Never Used  . Alcohol use No  . Drug use:     Types: Marijuana  . Sexual activity: Not on file   Other Topics Concern  . Not on file   Social History Narrative  . No narrative on file  No Known Allergies  Immunization History  Administered Date(s) Administered  . Influenza,inj,Quad PF,36+ Mos 12/29/2015  . Pneumococcal Polysaccharide-23 09/26/2015  . Tdap 12/29/2015   Review of Systems   Constitutional: Positive for malaise/fatigue.  HENT: Negative.   Eyes: Negative.  Negative for blurred vision.  Respiratory: Positive for wheezing. Negative for shortness of breath.   Cardiovascular: Positive for leg swelling. Negative for chest pain, palpitations, orthopnea and PND.  Gastrointestinal: Negative.   Endocrine: Negative.   Genitourinary: Negative.   Musculoskeletal: Negative.  Negative for neck pain.  Skin: Negative.   Allergic/Immunologic: Negative.  Negative for immunocompromised state.  Neurological: Positive for dizziness, weakness and headaches.  Hematological: Negative.   Psychiatric/Behavioral: Negative.       Objective:   Physical Exam  Constitutional: He is oriented to person, place, and time.  HENT:  Head: Normocephalic and atraumatic.  Right Ear: External ear normal.  Left Ear: External ear normal.  Nose: Nose normal.  Mouth/Throat: Oropharynx is clear and moist.  Eyes: Conjunctivae and EOM are normal. Pupils are equal, round, and reactive to light.  Cardiovascular: Normal rate, regular rhythm, normal heart sounds and intact distal pulses.   Pulmonary/Chest: Effort normal and breath sounds normal.  Abdominal: Soft. Bowel sounds are normal.  Musculoskeletal:       Right knee: He exhibits decreased range of motion, swelling and erythema.  Neurological: He is alert and oriented to person, place, and time. He has normal reflexes.  Skin: Skin is warm and dry.  Psychiatric: He has a normal mood and affect. His behavior is normal. Judgment and thought content normal.      BP (!) 162/92 Comment: manual  Pulse 70   Temp 97.7 F (36.5 C) (Oral)   Resp 14   Ht 6' (  1.829 m)   Wt 235 lb (106.6 kg)   SpO2 97%   BMI 31.87 kg/m  Assessment & Plan:  1. Essential hypertension Blood pressure is above goal on current medication regimen. Patient has not been taking anti-hypertensive medications consistently. Reviewed urinalysis, not proteinuria present.  -  COMPLETE METABOLIC PANEL WITH GFR - CBC with Differential  2. 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 Smoking cessation recommended.  3. Prediabetes Recommend a lowfat, low carbohydrate diet divided over 5-6 small meals, increase water intake to 6-8 glasses, and 150 minutes per week of cardiovascular exercise.   - HgB A1c  4. Chronic pain of right knee Will send a referral to orthopedic physician for further work-up and evaluation.  - ketorolac (TORADOL) injection 60 mg; Inject 2 mLs (60 mg total) into the muscle once.  5. Tobacco use disorder Smoking cessation instruction/counseling given:  counseled patient on the dangers of tobacco use, advised patient to stop smoking, and reviewed strategies to maximize success    RTC: 3 months for hypertension and prediabetes  The patient was given clear instructions to go to ER or return to medical center if symptoms do not improve, worsen or new problems develop. The patient verbalized understanding. Will notify patient with laboratory results.   Massie Maroon, FNP

## 2016-04-22 NOTE — Patient Instructions (Addendum)
Hypertension Hypertension is another name for high blood pressure. High blood pressure forces your heart to work harder to pump blood. A blood pressure reading has two numbers, which includes a higher number over a lower number (example: 110/72). Follow these instructions at home:  Have your blood pressure rechecked by your doctor.  Only take medicine as told by your doctor. Follow the directions carefully. The medicine does not work as well if you skip doses. Skipping doses also puts you at risk for problems.  Do not smoke.  Monitor your blood pressure at home as told by your doctor. Contact a doctor if:  You think you are having a reaction to the medicine you are taking.  You have repeat headaches or feel dizzy.  You have puffiness (swelling) in your ankles.  You have trouble with your vision. Get help right away if:  You get a very bad headache and are confused.  You feel weak, numb, or faint.  You get chest or belly (abdominal) pain.  You throw up (vomit).  You cannot breathe very well. This information is not intended to replace advice given to you by your health care provider. Make sure you discuss any questions you have with your health care provider. Document Released: 09/22/2007 Document Revised: 09/11/2015 Document Reviewed: 01/26/2013 Elsevier Interactive Patient Education  2017 [employer]  Joint Pain Introduction Joint pain can be caused by many things. The joint can be bruised, infected, weak from aging, or sore from exercise. The pain will probably go away if you follow your doctor's instructions for home care. If your joint pain continues, more tests may be needed to help find the cause of your condition. Follow these instructions at home: Watch your condition for any changes. Follow these instructions as told to lessen the pain that you are feeling:  Take medicines only as told by your doctor.  Rest the sore joint for as long as told by your doctor. If  your doctor tells you to, raise (elevate) the painful joint above the level of your heart while you are sitting or lying down.  Do not do things that cause pain or make the pain worse.  If told, put ice on the painful area:  Put ice in a plastic bag.  Place a towel between your skin and the bag.  Leave the ice on for 20 minutes, 2-3 times per day.  Wear an elastic bandage, splint, or sling as told by your doctor. Loosen the bandage or splint if your fingers or toes lose feeling (become numb) and tingle, or if they turn cold and blue.  Begin exercising or stretching the joint as told by your doctor. Ask your doctor what types of exercise are safe for you.  Keep all follow-up visits as told by your doctor. This is important. Contact a doctor if:  Your pain gets worse and medicine does not help it.  Your joint pain does not get better in 3 days.  You have more bruising or swelling.  You have a fever.  You lose 10 pounds (4.5 kg) or more without trying. Get help right away if:  You are not able to move the joint.  Your fingers or toes become numb or they turn cold and blue. This information is not intended to replace advice given to you by your health care provider. Make sure you discuss any questions you have with your health care provider. Document Released: 03/24/2009 Document Revised: 09/11/2015 Document Reviewed: 01/15/2014  2017 Elsevier  Knee Pain Knee pain is a very common symptom and can have many causes. Knee pain often goes away when you follow your health care provider's instructions for relieving pain and discomfort at home. However, knee pain can develop into a condition that needs treatment. Some conditions may include:  Arthritis caused by wear and tear (osteoarthritis).  Arthritis caused by swelling and irritation (rheumatoid arthritis or gout).  A cyst or growth in your knee.  An infection in your knee joint.  An injury that will not heal.  Damage,  swelling, or irritation of the tissues that support your knee (torn ligaments or tendinitis). If your knee pain continues, additional tests may be ordered to diagnose your condition. Tests may include X-rays or other imaging studies of your knee. You may also need to have fluid removed from your knee. Treatment for ongoing knee pain depends on the cause, but treatment may include:  Medicines to relieve pain or swelling.  Steroid injections in your knee.  Physical therapy.  Surgery. HOME CARE INSTRUCTIONS  Take medicines only as directed by your health care provider.  Rest your knee and keep it raised (elevated) while you are resting.  Do not do things that cause or worsen pain.  Avoid high-impact activities or exercises, such as running, jumping rope, or doing jumping jacks.  Apply ice to the knee area:  Put ice in a plastic bag.  Place a towel between your skin and the bag.  Leave the ice on for 20 minutes, 2-3 times a day.  Ask your health care provider if you should wear an elastic knee support.  Keep a pillow under your knee when you sleep.  Lose weight if you are overweight. Extra weight can put pressure on your knee.  Do not use any tobacco products, including cigarettes, chewing tobacco, or electronic cigarettes. If you need help quitting, ask your health care provider. Smoking may slow the healing of any bone and joint problems that you may have. SEEK MEDICAL CARE IF:  Your knee pain continues, changes, or gets worse.  You have a fever along with knee pain.  Your knee buckles or locks up.  Your knee becomes more swollen. SEEK IMMEDIATE MEDICAL CARE IF:   Your knee joint feels hot to the touch.  You have chest pain or trouble breathing. This information is not intended to replace advice given to you by your health care provider. Make sure you discuss any questions you have with your health care provider. Document Released: 01/31/2007 Document Revised:  04/26/2014 Document Reviewed: 11/19/2013 Elsevier Interactive Patient Education  2017 [employer].

## 2016-05-20 ENCOUNTER — Other Ambulatory Visit: Payer: Self-pay | Admitting: Family Medicine

## 2016-05-20 MED FILL — METOPROLOL SUCC ER 25 MG TA: 25 | 30 days supply | Qty: 30 | Fill #0

## 2016-05-20 MED FILL — CLOPIDOGREL 75 MG TABLET: 75 | 30 days supply | Qty: 30 | Fill #0

## 2016-05-20 MED FILL — ?GABAPENTIN 600 MG TABLET: 600 | 30 days supply | Qty: 60 | Fill #0

## 2016-05-20 MED FILL — ?AMITRIPTYLINE HCL 50MG TAB: 50 | 30 days supply | Qty: 90 | Fill #0

## 2016-05-21 ENCOUNTER — Ambulatory Visit: Payer: No Typology Code available for payment source | Attending: Family Medicine

## 2016-05-24 ENCOUNTER — Other Ambulatory Visit: Payer: Self-pay | Admitting: Family Medicine

## 2016-05-24 ENCOUNTER — Telehealth: Payer: Self-pay

## 2016-05-24 DIAGNOSIS — K029 Dental caries, unspecified: Secondary | ICD-10-CM

## 2016-05-24 NOTE — Telephone Encounter (Signed)
[place], Please advise. Thanks!  

## 2016-06-18 ENCOUNTER — Other Ambulatory Visit: Payer: Self-pay | Admitting: Family Medicine

## 2016-06-18 MED FILL — CLOPIDOGREL 75 MG TABLET: 75 | 30 days supply | Qty: 30 | Fill #0

## 2016-06-18 MED FILL — ?AMITRIPTYLINE HCL 50MG TAB: 50 | 30 days supply | Qty: 90 | Fill #0

## 2016-06-18 MED FILL — ATORVASTATIN 40 MG TABLET: 40 | 30 days supply | Qty: 30 | Fill #0

## 2016-06-18 MED FILL — METOPROLOL SUCC ER 25 MG TA: 25 | 30 days supply | Qty: 30 | Fill #0

## 2016-06-18 MED FILL — GABAPENTIN 600 MG TABLET: 600 | 30 days supply | Qty: 60 | Fill #0

## 2016-06-22 ENCOUNTER — Ambulatory Visit (INDEPENDENT_AMBULATORY_CARE_PROVIDER_SITE_OTHER): Payer: No Typology Code available for payment source | Admitting: Family Medicine

## 2016-06-22 ENCOUNTER — Encounter: Payer: Self-pay | Admitting: Family Medicine

## 2016-06-22 VITALS — BP 120/76 | HR 67 | Temp 98.1°F | Resp 16 | Ht 72.0 in | Wt 242.0 lb

## 2016-06-22 DIAGNOSIS — I6302 Cerebral infarction due to thrombosis of basilar artery: Secondary | ICD-10-CM

## 2016-06-22 DIAGNOSIS — F17200 Nicotine dependence, unspecified, uncomplicated: Secondary | ICD-10-CM

## 2016-06-22 DIAGNOSIS — G4700 Insomnia, unspecified: Secondary | ICD-10-CM

## 2016-06-22 DIAGNOSIS — G8929 Other chronic pain: Secondary | ICD-10-CM

## 2016-06-22 DIAGNOSIS — R7303 Prediabetes: Secondary | ICD-10-CM

## 2016-06-22 DIAGNOSIS — K089 Disorder of teeth and supporting structures, unspecified: Secondary | ICD-10-CM

## 2016-06-22 DIAGNOSIS — I10 Essential (primary) hypertension: Secondary | ICD-10-CM

## 2016-06-22 DIAGNOSIS — K029 Dental caries, unspecified: Secondary | ICD-10-CM

## 2016-06-22 DIAGNOSIS — G629 Polyneuropathy, unspecified: Secondary | ICD-10-CM

## 2016-06-22 DIAGNOSIS — E785 Hyperlipidemia, unspecified: Secondary | ICD-10-CM

## 2016-06-22 LAB — POCT GLYCOSYLATED HEMOGLOBIN (HGB A1C): HEMOGLOBIN A1C: 6.4

## 2016-06-22 LAB — COMPLETE METABOLIC PANEL WITH GFR
ALT: 23 U/L (ref 9–46)
AST: 14 U/L (ref 10–40)
Albumin: 4.2 g/dL (ref 3.6–5.1)
Alkaline Phosphatase: 115 U/L (ref 40–115)
BUN: 11 mg/dL (ref 7–25)
CALCIUM: 8.9 mg/dL (ref 8.6–10.3)
CHLORIDE: 103 mmol/L (ref 98–110)
CO2: 22 mmol/L (ref 20–31)
Creat: 1.09 mg/dL (ref 0.60–1.35)
GFR, Est Non African American: 82 mL/min (ref >=60)
Glucose, Bld: 87 mg/dL (ref 65–99)
Potassium: 4.2 mmol/L (ref 3.5–5.3)
Sodium: 136 mmol/L (ref 135–146)
Total Bilirubin: 0.4 mg/dL (ref 0.2–1.2)
Total Protein: 6.7 g/dL (ref 6.1–8.1)

## 2016-06-22 LAB — POCT URINALYSIS DIP (DEVICE)
BILIRUBIN URINE: NEGATIVE
GLUCOSE, UA: NEGATIVE mg/dL
Hgb urine dipstick: NEGATIVE
KETONES UR: NEGATIVE mg/dL
Leukocytes, UA: NEGATIVE
Nitrite: NEGATIVE
PROTEIN: NEGATIVE mg/dL
Specific Gravity, Urine: 1.015 (ref 1.005–1.030)
Urobilinogen, UA: 1 mg/dL (ref 0.0–1.0)
pH: 7 (ref 5.0–8.0)

## 2016-06-22 MED ORDER — AMITRIPTYLINE HCL 50 MG PO TABS: 150.0000 mg | ORAL_TABLET | Freq: Every day | ORAL | 0 refills | Status: DC

## 2016-06-22 MED ORDER — GABAPENTIN 600 MG PO TABS
1200.0000 mg | ORAL_TABLET | Freq: Every day | ORAL | 5 refills | Status: DC
Start: 2016-06-22 — End: 2017-04-28

## 2016-06-22 MED ORDER — CLOPIDOGREL BISULFATE 75 MG PO TABS: 75.0000 mg | ORAL_TABLET | Freq: Every day | ORAL | 5 refills | Status: DC

## 2016-06-22 MED ORDER — ATORVASTATIN CALCIUM 40 MG PO TABS: ORAL_TABLET | ORAL | 5 refills | Status: DC

## 2016-06-22 MED ORDER — METOPROLOL SUCCINATE ER 25 MG PO TB24: 25.0000 mg | ORAL_TABLET | Freq: Every day | ORAL | 5 refills | Status: DC

## 2016-06-22 MED ORDER — LISINOPRIL-HYDROCHLOROTHIAZIDE 10-12.5 MG PO TABS: 1.0000 | ORAL_TABLET | Freq: Every day | ORAL | 3 refills | Status: DC

## 2016-06-22 MED FILL — LISINOPRIL-HCTZ 10-12.5 MG: 10-12.5 | 30 days supply | Qty: 30 | Fill #0

## 2016-06-22 NOTE — Progress Notes (Signed)
Subjective:    Patient ID: Aaron Kennedy, male    DOB: Jul 21, 1970, 46 y.o.   MRN: [id number]  HPI Aaron Kennedy, a history of hypertension, stroke, prediabetes, and dental pain presents for a follow up of chronic conditions.  Aaron Kennedy says that he has been taking medications consistently.  Aaron Kennedy had a CVA in November 2016. 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 He did not complete therapy and has been unable to follow up with neurology due to lack of payer source. Aaron Kennedy has a history of hypertension and prediabetes. He is not exercising and is not adherent to low salt diet.  Blood pressure is well controlled at home.  Patient denies chest pain, dyspnea, fatigue, near-syncope, orthopnea, palpitations, syncope and tachypnea.  Cardiovascular risk factors includes: obesity (BMI >= 30 kg/m2), sedentary lifestyle and smoking/ tobacco exposure..  Past Medical History:  Diagnosis Date  . Stroke [facility])    Immunization History  Administered Date(s) Administered  . Influenza,inj,Quad PF,36+ Mos 12/29/2015  . Pneumococcal Polysaccharide-23 09/26/2015  . Tdap 12/29/2015   Social History   Social History  . Marital status: Single    Spouse name: N/A  . Number of children: N/A  . Years of education: N/A   Occupational History  . Not on file.   Social History Main Topics  . Smoking status: Current Every Day Smoker    Packs/day: 0.50    Years: 20.00    Types: Cigarettes  . Smokeless tobacco: Never Used  . Alcohol use No  . Drug use: Yes    Types: Marijuana  . Sexual activity: Not on file   Other Topics Concern  . Not on file   Social History Narrative  . No narrative on file   Review of Systems  Constitutional: Negative.  Negative for fatigue and fever.  HENT: Positive for dental problem.   Eyes: Negative.   Respiratory: Negative.   Cardiovascular: Negative for chest pain, palpitations and leg swelling.  Endocrine: Negative.  Negative for  polydipsia, polyphagia and polyuria.  Genitourinary: Negative.   Musculoskeletal: Positive for myalgias (right knee pain).  Skin: Negative.   Allergic/Immunologic: Negative for immunocompromised state.  Neurological: Negative.  Negative for weakness.  Hematological: Negative.   Psychiatric/Behavioral: Negative.        Objective:   Physical Exam  Constitutional: He is oriented to person, place, and time.  HENT:  Head: Normocephalic and atraumatic.  Right Ear: External ear normal.  Left Ear: External ear normal.  Nose: Nose normal.  Mouth/Throat: Oropharynx is clear and moist. Abnormal dentition. Dental caries present.  Eyes: Conjunctivae and EOM are normal. Pupils are equal, round, and reactive to light.  Neck: Normal range of motion. Neck supple.  Cardiovascular: Normal rate, regular rhythm, normal heart sounds and intact distal pulses.   Pulmonary/Chest: Effort normal and breath sounds normal.  Abdominal: Soft. Bowel sounds are normal.  Musculoskeletal: Normal range of motion.  Neurological: He is alert and oriented to person, place, and time. He has normal reflexes.  Skin: Skin is warm and dry.  Psychiatric: He has a normal mood and affect. His behavior is normal. Judgment and thought content normal.      BP 120/76 (BP Location: Left Arm, Patient Position: Sitting, Cuff Size: Large)   Pulse 67   Temp 98.1 F (36.7 C) (Oral)   Resp 16   Ht 6' (1.829 m)   Wt 242 lb (109.8 kg)   SpO2 97%   BMI 32.82  kg/m  Assessment & Plan:  1. Essential hypertension - lisinopril-hydrochlorothiazide (PRINZIDE,ZESTORETIC) 10-12.5 MG tablet; Take 1 tablet by mouth daily.  Dispense: 90 tablet; Refill: 3 - metoprolol succinate (TOPROL-XL) 25 MG 24 hr tablet; Take 1 tablet (25 mg total) by mouth daily.  Dispense: 30 tablet; Refill: 5 - COMPLETE METABOLIC PANEL WITH GFR  2. Cerebrovascular accident (CVA) due to thrombosis of basilar artery (HCC)  - clopidogrel (PLAVIX) 75 MG tablet; Take 1  tablet (75 mg total) by mouth daily.  Dispense: 30 tablet; Refill: 5  3. Hyperlipidemia, unspecified hyperlipidemia type The patient is asked to make an attempt to improve diet and exercise patterns to aid in medical management of this problem. - atorvastatin (LIPITOR) 40 MG tablet; TAKE 1 TABLET BY MOUTH DAILY AT 6 PM.  Dispense: 30 tablet; Refill: 5  4. Insomnia, unspecified type - amitriptyline (ELAVIL) 50 MG tablet; Take 3 tablets (150 mg total) by mouth at bedtime.  Dispense: 90 tablet; Refill: 0  5. Neuropathy (HCC) - gabapentin (NEURONTIN) 600 MG tablet; Take 2 tablets (1,200 mg total) by mouth daily.  Dispense: 60 tablet; Refill: 5  6. Prediabetes Recommend a lowfat, low carbohydrate diet divided over 5-6 small meals, increase water intake to 6-8 glasses, and 150 minutes per week of cardiovascular exercise.   - HgB A1c  7. Chronic dental pain - Ambulatory referral to Dentistry  8. Dental caries - Ambulatory referral to Dentistry  9. Tobacco use disorder Smoking cessation instruction/counseling given:  counseled patient on the dangers of tobacco use, advised patient to stop smoking, and reviewed strategies to maximize success    RTC: 3 months for chronic conditions

## 2016-07-21 ENCOUNTER — Ambulatory Visit: Payer: No Typology Code available for payment source | Admitting: Family Medicine

## 2016-07-22 MED FILL — METOPROLOL SUCC ER 25 MG TA: 25 | 30 days supply | Qty: 30 | Fill #0

## 2016-07-22 MED FILL — CLOPIDOGREL 75 MG TABLET: 75 | 30 days supply | Qty: 30 | Fill #0

## 2016-07-22 MED FILL — ATORVASTATIN 40 MG TABLET: 40 | 30 days supply | Qty: 30 | Fill #0

## 2016-07-22 MED FILL — AMITRIPTYLINE HCL 50 MG TAB: 50 | 30 days supply | Qty: 90 | Fill #0

## 2016-07-22 MED FILL — GABAPENTIN 600 MG TABLET: 600 | 30 days supply | Qty: 60 | Fill #0

## 2016-07-22 MED FILL — LISINOPRIL-HCTZ 10-12.5 MG: 10-12.5 | 30 days supply | Qty: 30 | Fill #1

## 2016-08-19 MED FILL — GABAPENTIN 600 MG TABLET: 600 | 30 days supply | Qty: 60 | Fill #1

## 2016-08-19 MED FILL — LISINOPRIL-HCTZ 10-12.5 MG: 10-12.5 | 30 days supply | Qty: 30 | Fill #2

## 2016-08-19 MED FILL — METOPROLOL SUCC ER 25 MG TA: 25 | 30 days supply | Qty: 30 | Fill #1

## 2016-08-19 MED FILL — AMITRIPTYLINE HCL 50 MG TAB: 50 | 30 days supply | Qty: 90 | Fill #0

## 2016-08-19 MED FILL — CLOPIDOGREL 75 MG TABLET: 75 | 30 days supply | Qty: 30 | Fill #1

## 2016-09-21 ENCOUNTER — Other Ambulatory Visit: Payer: Self-pay | Admitting: Family Medicine

## 2016-09-21 MED FILL — CLOPIDOGREL 75 MG TABLET: 75 | 30 days supply | Qty: 30 | Fill #2

## 2016-09-21 MED FILL — AMITRIPTYLINE HCL 50 MG TAB: 50 | 30 days supply | Qty: 90 | Fill #0

## 2016-09-21 MED FILL — LISINOPRIL-HCTZ 10-12.5 MG: 10-12.5 | 30 days supply | Qty: 30 | Fill #3

## 2016-09-21 MED FILL — GABAPENTIN 600 MG TABLET: 600 | 30 days supply | Qty: 60 | Fill #2

## 2016-09-21 MED FILL — METOPROLOL SUCC ER 25 MG TA: 25 | 30 days supply | Qty: 30 | Fill #2

## 2016-09-22 ENCOUNTER — Ambulatory Visit: Payer: No Typology Code available for payment source | Admitting: Family Medicine

## 2016-09-23 ENCOUNTER — Encounter: Payer: Self-pay | Admitting: Family Medicine

## 2016-09-23 ENCOUNTER — Ambulatory Visit (INDEPENDENT_AMBULATORY_CARE_PROVIDER_SITE_OTHER): Payer: No Typology Code available for payment source | Admitting: Family Medicine

## 2016-09-23 VITALS — BP 133/87 | HR 98 | Temp 98.4°F | Resp 16 | Ht 72.0 in | Wt 243.0 lb

## 2016-09-23 DIAGNOSIS — R454 Irritability and anger: Secondary | ICD-10-CM

## 2016-09-23 DIAGNOSIS — I10 Essential (primary) hypertension: Secondary | ICD-10-CM

## 2016-09-23 DIAGNOSIS — I6330 Cerebral infarction due to thrombosis of unspecified cerebral artery: Secondary | ICD-10-CM

## 2016-09-23 DIAGNOSIS — R7303 Prediabetes: Secondary | ICD-10-CM

## 2016-09-23 DIAGNOSIS — F17200 Nicotine dependence, unspecified, uncomplicated: Secondary | ICD-10-CM

## 2016-09-23 DIAGNOSIS — E785 Hyperlipidemia, unspecified: Secondary | ICD-10-CM

## 2016-09-23 LAB — POCT GLYCOSYLATED HEMOGLOBIN (HGB A1C): HEMOGLOBIN A1C: 5.9

## 2016-09-23 NOTE — Progress Notes (Signed)
Subjective:    Patient ID: Aaron Kennedy, male    DOB: Sep 15, 1970, 46 y.o.   MRN: [id number]  HPI Aaron Kennedy, a history of hypertension, CVA, prediabetes, and dental pain presents for a follow up of chronic conditions.  Aaron Kennedy says that he has been taking medications consistently.  Aaron Kennedy had a CVA in November 2016. 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 He did not complete physical therapy and has been unable to follow up with neurology due to lack of payer source. Aaron Kennedy has been taking Plavix consistently and has not missed any doses. He continues to be an everyday tobacco user.   Aaron Kennedy has a history of hypertension and prediabetes. He is not exercising and is not adherent to low salt diet.  Blood pressure is well controlled at home.  Patient denies chest pain, dyspnea, fatigue, near-syncope, orthopnea, palpitations, syncope and tachypnea.  Cardiovascular risk factors includes: obesity (BMI >= 30 kg/m2), sedentary lifestyle and smoking/ tobacco exposure.  Aaron Kennedy has also been experiencing some behavorial concerns over the past month. He becomes easily agitated and and irritated in most situations. He attributes behavior concerns to problems with his teenage daughter and roommate. He denies current suicidal or homicidal thoughts. He also denies feelings of losing control, racing thoughts, or depression. Past Medical History:  Diagnosis Date  . Stroke [facility])    Immunization History  Administered Date(s) Administered  . Influenza,inj,Quad PF,36+ Mos 12/29/2015  . Pneumococcal Polysaccharide-23 09/26/2015  . Tdap 12/29/2015   Social History   Social History  . Marital status: Single    Spouse name: N/A  . Number of children: N/A  . Years of education: N/A   Occupational History  . Not on file.   Social History Main Topics  . Smoking status: Current Every Day Smoker    Packs/day: 0.50    Years: 20.00    Types:  Cigarettes  . Smokeless tobacco: Never Used  . Alcohol use No  . Drug use: Yes    Types: Marijuana  . Sexual activity: Not on file   Other Topics Concern  . Not on file   Social History Narrative  . No narrative on file   Review of Systems  Constitutional: Negative.  Negative for fatigue and fever.  HENT: Positive for dental problem.   Eyes: Negative.   Respiratory: Negative.   Cardiovascular: Negative for chest pain, palpitations and leg swelling.  Endocrine: Negative.  Negative for polydipsia, polyphagia and polyuria.  Genitourinary: Negative.   Musculoskeletal: Positive for myalgias (right knee pain).  Skin: Negative.   Allergic/Immunologic: Negative for immunocompromised state.  Neurological: Negative.  Negative for weakness.  Hematological: Negative.   Psychiatric/Behavioral: Positive for agitation and behavioral problems. Negative for self-injury, sleep disturbance and suicidal ideas. The patient is not nervous/anxious and is not hyperactive.        Objective:   Physical Exam  Constitutional: He is oriented to person, place, and time.  HENT:  Head: Normocephalic and atraumatic.  Right Ear: External ear normal.  Left Ear: External ear normal.  Nose: Nose normal.  Mouth/Throat: Oropharynx is clear and moist. Abnormal dentition. Dental caries present.  Eyes: Conjunctivae and EOM are normal. Pupils are equal, round, and reactive to light.  Neck: Normal range of motion. Neck supple.  Cardiovascular: Normal rate, regular rhythm, normal heart sounds and intact distal pulses.   Pulmonary/Chest: Effort normal and breath sounds normal.  Abdominal: Soft. Bowel sounds are normal.  Musculoskeletal:  Normal range of motion.  Neurological: He is alert and oriented to person, place, and time. He has normal reflexes.  Skin: Skin is warm and dry.  Psychiatric: He has a normal mood and affect. His behavior is normal. Judgment and thought content normal.      BP 133/87 (BP  Location: Left Arm, Patient Position: Sitting, Cuff Size: Large)   Pulse 98   Temp 98.4 F (36.9 C) (Oral)   Resp 16   Ht 6' (1.829 m)   Wt 243 lb (110.2 kg)   SpO2 96%   BMI 32.96 kg/m  Assessment & Plan:  1. Prediabetes Recommend a lowfat, low carbohydrate diet divided over 5-6 small meals, increase water intake to 6-8 glasses, and 150 minutes per week of cardiovascular exercise.   - HgB A1c  2. Essential hypertension Blood pressure is at goal on current medication regimen.   3. Cerebrovascular accident (CVA) due to thrombosis of cerebral artery (HCC) Continue Plavix 75 mg daily.  Discussed smoking cessation at length  4. Hyperlipidemia, unspecified hyperlipidemia type The patient is asked to make an attempt to improve diet and exercise patterns to aid in medical management of this problem.  5. Tobacco use disorder Smoking cessation instruction/counseling given:  counseled patient on the dangers of tobacco use, advised patient to stop smoking, and reviewed strategies to maximize success   6. Aaron Kennedy says that he has been easily irritated over the past several weeks. He attributes behavior concerns to problems with his teenage daughter and roommate. He denies current suicidal or homicidal thoughts. He also denies feelings of losing control, racing thoughts, or depression. I will send a referral to psychology for further work-up.    RTC: 3 months for chronic conditions   Halen Antenucci Rennis Petty  MSN, FNP-C [facility] Patient [facility] [address] [place], [place] [address] [phone]

## 2016-09-26 NOTE — Patient Instructions (Addendum)
DASH Eating Plan DASH stands for "Dietary Approaches to Stop Hypertension." The DASH eating plan is a healthy eating plan that has been shown to reduce high blood pressure (hypertension). It may also reduce your risk for type 2 diabetes, heart disease, and stroke. The DASH eating plan may also help with weight loss. What are tips for following this plan? General guidelines  Avoid eating more than 2,300 mg (milligrams) of salt (sodium) a day. If you have hypertension, you may need to reduce your sodium intake to 1,500 mg a day.  Limit alcohol intake to no more than 1 drink a day for nonpregnant women and 2 drinks a day for men. One drink equals 12 oz of beer, 5 oz of wine, or 1 oz of hard liquor.  Work with your health care provider to maintain a healthy body weight or to lose weight. Ask what an ideal weight is for you.  Get at least 30 minutes of exercise that causes your heart to beat faster (aerobic exercise) most days of the week. Activities may include walking, swimming, or biking.  Work with your health care provider or diet and nutrition specialist (dietitian) to adjust your eating plan to your individual calorie needs. Reading food labels  Check food labels for the amount of sodium per serving. Choose foods with less than 5 percent of the Daily Value of sodium. Generally, foods with less than 300 mg of sodium per serving fit into this eating plan.  To find whole grains, look for the word "whole" as the first word in the ingredient list. Shopping  Buy products labeled as "low-sodium" or "no salt added."  Buy fresh foods. Avoid canned foods and premade or frozen meals. Cooking  Avoid adding salt when cooking. Use salt-free seasonings or herbs instead of table salt or sea salt. Check with your health care provider or pharmacist before using salt substitutes.  Do not fry foods. Cook foods using healthy methods such as baking, boiling, grilling, and broiling instead.  Cook with  heart-healthy oils, such as olive, canola, soybean, or sunflower oil. Meal planning   Eat a balanced diet that includes: ? 5 or more servings of fruits and vegetables each day. At each meal, try to fill half of your plate with fruits and vegetables. ? Up to 6-8 servings of whole grains each day. ? Less than 6 oz of lean meat, poultry, or fish each day. A 3-oz serving of meat is about the same size as a deck of cards. One egg equals 1 oz. ? 2 servings of low-fat dairy each day. ? A serving of nuts, seeds, or beans 5 times each week. ? Heart-healthy fats. Healthy fats called Omega-3 fatty acids are found in foods such as flaxseeds and coldwater fish, like sardines, salmon, and mackerel.  Limit how much you eat of the following: ? Canned or prepackaged foods. ? Food that is high in trans fat, such as fried foods. ? Food that is high in saturated fat, such as fatty meat. ? Sweets, desserts, sugary drinks, and other foods with added sugar. ? Full-fat dairy products.  Do not salt foods before eating.  Try to eat at least 2 vegetarian meals each week.  Eat more home-cooked food and less restaurant, buffet, and fast food.  When eating at a restaurant, ask that your food be prepared with less salt or no salt, if possible. What foods are recommended? The items listed may not be a complete list. Talk with your dietitian about what   dietary choices are best for you. Grains Whole-grain or whole-wheat bread. Whole-grain or whole-wheat pasta. Brown rice. Oatmeal. Quinoa. Bulgur. Whole-grain and low-sodium cereals. Pita bread. Low-fat, low-sodium crackers. Whole-wheat flour tortillas. Vegetables Fresh or frozen vegetables (raw, steamed, roasted, or grilled). Low-sodium or reduced-sodium tomato and vegetable juice. Low-sodium or reduced-sodium tomato sauce and tomato paste. Low-sodium or reduced-sodium canned vegetables. Fruits All fresh, dried, or frozen fruit. Canned fruit in natural juice (without  added sugar). Meat and other protein foods Skinless chicken or turkey. Ground chicken or turkey. Pork with fat trimmed off. Fish and seafood. Egg whites. Dried beans, peas, or lentils. Unsalted nuts, nut butters, and seeds. Unsalted canned beans. Lean cuts of beef with fat trimmed off. Low-sodium, lean deli meat. Dairy Low-fat (1%) or fat-free (skim) milk. Fat-free, low-fat, or reduced-fat cheeses. Nonfat, low-sodium ricotta or cottage cheese. Low-fat or nonfat yogurt. Low-fat, low-sodium cheese. Fats and oils Soft margarine without trans fats. Vegetable oil. Low-fat, reduced-fat, or light mayonnaise and salad dressings (reduced-sodium). Canola, safflower, olive, soybean, and sunflower oils. Avocado. Seasoning and other foods Herbs. Spices. Seasoning mixes without salt. Unsalted popcorn and pretzels. Fat-free sweets. What foods are not recommended? The items listed may not be a complete list. Talk with your dietitian about what dietary choices are best for you. Grains Baked goods made with fat, such as croissants, muffins, or some breads. Dry pasta or rice meal packs. Vegetables Creamed or fried vegetables. Vegetables in a cheese sauce. Regular canned vegetables (not low-sodium or reduced-sodium). Regular canned tomato sauce and paste (not low-sodium or reduced-sodium). Regular tomato and vegetable juice (not low-sodium or reduced-sodium). Pickles. Olives. Fruits Canned fruit in a light or heavy syrup. Fried fruit. Fruit in cream or butter sauce. Meat and other protein foods Fatty cuts of meat. Ribs. Fried meat. Bacon. Sausage. Bologna and other processed lunch meats. Salami. Fatback. Hotdogs. Bratwurst. Salted nuts and seeds. Canned beans with added salt. Canned or smoked fish. Whole eggs or egg yolks. Chicken or turkey with skin. Dairy Whole or 2% milk, cream, and half-and-half. Whole or full-fat cream cheese. Whole-fat or sweetened yogurt. Full-fat cheese. Nondairy creamers. Whipped toppings.  Processed cheese and cheese spreads. Fats and oils Butter. Stick margarine. Lard. Shortening. Ghee. Bacon fat. Tropical oils, such as coconut, palm kernel, or palm oil. Seasoning and other foods Salted popcorn and pretzels. Onion salt, garlic salt, seasoned salt, table salt, and sea salt. Worcestershire sauce. Tartar sauce. Barbecue sauce. Teriyaki sauce. Soy sauce, including reduced-sodium. Steak sauce. Canned and packaged gravies. Fish sauce. Oyster sauce. Cocktail sauce. Horseradish that you find on the shelf. Ketchup. Mustard. Meat flavorings and tenderizers. Bouillon cubes. Hot sauce and Tabasco sauce. Premade or packaged marinades. Premade or packaged taco seasonings. Relishes. Regular salad dressings. Where to find more information:  National Heart, Lung, and Blood Institute: www.nhlbi.nih.gov  American Heart Association: www.heart.org Summary  The DASH eating plan is a healthy eating plan that has been shown to reduce high blood pressure (hypertension). It may also reduce your risk for type 2 diabetes, heart disease, and stroke.  With the DASH eating plan, you should limit salt (sodium) intake to 2,300 mg a day. If you have hypertension, you may need to reduce your sodium intake to 1,500 mg a day.  When on the DASH eating plan, aim to eat more fresh fruits and vegetables, whole grains, lean proteins, low-fat dairy, and heart-healthy fats.  Work with your health care provider or diet and nutrition specialist (dietitian) to adjust your eating plan to your individual   calorie needs. This information is not intended to replace advice given to you by your health care provider. Make sure you discuss any questions you have with your health care provider. Document Released: 03/25/2011 Document Revised: 03/29/2016 Document Reviewed: 03/29/2016 Elsevier Interactive Patient Education  2017 [employer]  Dyslipidemia Dyslipidemia is an imbalance of waxy, fat-like substances (lipids) in the  blood. The body needs lipids in small amounts. Dyslipidemia often involves a high level of cholesterol or triglycerides, which are types of lipids. Common forms of dyslipidemia include:  High levels of bad cholesterol (LDL cholesterol). LDL is the type of cholesterol that causes fatty deposits (plaques) to build up in the blood vessels that carry blood away from your heart (arteries).  Low levels of good cholesterol (HDL cholesterol). HDL cholesterol is the type of cholesterol that protects against heart disease. High levels of HDL remove the LDL buildup from arteries.  High levels of triglycerides. Triglycerides are a fatty substance in the blood that is linked to a buildup of plaques in the arteries.  You can develop dyslipidemia because of the genes you are born with (primary dyslipidemia) or changes that occur during your life (secondary dyslipidemia), or as a side effect of certain medical treatments. What are the causes? Primary dyslipidemia is caused by changes (mutations) in genes that are passed down through families (inherited). These mutations cause several types of dyslipidemia. Mutations can result in disorders that make the body produce too much LDL cholesterol or triglycerides, or not enough HDL cholesterol. These disorders may lead to heart disease, arterial disease, or stroke at an early age. Causes of secondary dyslipidemia include certain lifestyle choices and diseases that lead to dyslipidemia, such as:  Eating a diet that is high in animal fat.  Not getting enough activity or exercise (having a sedentary lifestyle).  Having diabetes, kidney disease, liver disease, or thyroid disease.  Drinking large amounts of alcohol.  Using certain types of drugs.  What increases the risk? You may be at greater risk for dyslipidemia if you are an older man or if you are a woman who has gone through menopause. Other risk factors include:  Having a family history of  dyslipidemia.  Taking certain medicines, including birth control pills, steroids, some diuretics, beta-blockers, and some medicines forHIV.  Smoking cigarettes.  Eating a high-fat diet.  Drinking large amounts of alcohol.  Having certain medical conditions such as diabetes, polycystic ovary syndrome (PCOS), pregnancy, kidney disease, liver disease, or hypothyroidism.  Not exercising regularly.  Being overweight or obese with too much belly fat.  What are the signs or symptoms? Dyslipidemia does not usually cause any symptoms. Very high lipid levels can cause fatty bumps under the skin (xanthomas) or a white or gray ring around the black center (pupil) of the eye. Very high triglyceride levels can cause inflammation of the pancreas (pancreatitis). How is this diagnosed? Your health care provider may diagnose dyslipidemia based on a routine blood test (fasting blood test). Because most people do not have symptoms of the condition, this blood testing (lipid profile) is done on adults age 34 and older and is repeated every 5 years. This test checks:  Total cholesterol. This is a measure of the total amount of cholesterol in your blood, including LDL cholesterol, HDL cholesterol, and triglycerides. A healthy number is below 200.  LDL cholesterol. The target number for LDL cholesterol is different for each person, depending on individual risk factors. For most people, a number below 100 is healthy. Ask your  health care provider what your LDL cholesterol number should be.  HDL cholesterol. An HDL level of 60 or higher is best because it helps to protect against heart disease. A number below 26 for men or below 94 for women increases the risk for heart disease.  Triglycerides. A healthy triglyceride number is below 150.  If your lipid profile is abnormal, your health care provider may do other blood tests to get more information about your condition. How is this treated? Treatment depends on  the type of dyslipidemia that you have and your other risk factors for heart disease and stroke. Your health care provider will have a target range for your lipid levels based on this information. For many people, treatment starts with lifestyle changes, such as diet and exercise. Your health care provider may recommend that you:  Get regular exercise.  Make changes to your diet.  Quit smoking if you smoke.  If diet changes and exercise do not help you reach your goals, your health care provider may also prescribe medicine to lower lipids. The most commonly prescribed type of medicine lowers your LDL cholesterol (statin drug). If you have a high triglyceride level, your provider may prescribe another type of drug (fibrate) or an omega-3 fish oil supplement, or both. Follow these instructions at home:  Take over-the-counter and prescription medicines only as told by your health care provider. This includes supplements.  Get regular exercise. Start an aerobic exercise and strength training program as told by your health care provider. Ask your health care provider what activities are safe for you. Your health care provider may recommend: ? 30 minutes of aerobic activity 4-6 days a week. Brisk walking is an example of aerobic activity. ? Strength training 2 days a week.  Eat a healthy diet as told by your health care provider. This can help you reach and maintain a healthy weight, lower your LDL cholesterol, and raise your HDL cholesterol. It may help to work with a diet and nutrition specialist (dietitian) to make a plan that is right for you. Your dietitian or health care provider may recommend: ? Limiting your calories, if you are overweight. ? Eating more fruits, vegetables, whole grains, fish, and lean meats. ? Limiting saturated fat, trans fat, and cholesterol.  Follow instructions from your health care provider or dietitian about eating or drinking restrictions.  Limit alcohol intake to  no more than one drink per day for nonpregnant women and two drinks per day for men. One drink equals 12 oz of beer, 5 oz of wine, or 1 oz of hard liquor.  Do not use any products that contain nicotine or tobacco, such as cigarettes and e-cigarettes. If you need help quitting, ask your health care provider.  Keep all follow-up visits as told by your health care provider. This is important. Contact a health care provider if:  You are having trouble sticking to your exercise or diet plan.  You are struggling to quit smoking or control your use of alcohol. Summary  Dyslipidemia is an imbalance of waxy, fat-like substances (lipids) in the blood. The body needs lipids in small amounts. Dyslipidemia often involves a high level of cholesterol or triglycerides, which are types of lipids.  Treatment depends on the type of dyslipidemia that you have and your other risk factors for heart disease and stroke.  For many people, treatment starts with lifestyle changes, such as diet and exercise. Your health care provider may also prescribe medicine to lower lipids. This information  is not intended to replace advice given to you by your health care provider. Make sure you discuss any questions you have with your health care provider. Document Released: 04/10/2013 Document Revised: 12/01/2015 Document Reviewed: 12/01/2015 Elsevier Interactive Patient Education  [employer].

## 2016-10-05 MED FILL — NYSTATIN-TRIAMCINOLONE CRM: 100000-0.1 | 20 days supply | Qty: 15 | Fill #0

## 2016-10-12 ENCOUNTER — Ambulatory Visit: Payer: No Typology Code available for payment source | Admitting: Clinical

## 2016-10-18 ENCOUNTER — Other Ambulatory Visit: Payer: Self-pay | Admitting: Family Medicine

## 2016-10-18 MED FILL — CLOPIDOGREL 75 MG TABLET: 75 | 30 days supply | Qty: 30 | Fill #3

## 2016-10-18 MED FILL — AMITRIPTYLINE HCL 50 MG TAB: 50 | 30 days supply | Qty: 90 | Fill #0

## 2016-10-18 MED FILL — METOPROLOL SUCC ER 25 MG TA: 25 | 30 days supply | Qty: 30 | Fill #3

## 2016-10-18 MED FILL — GABAPENTIN 600 MG TABLET: 600 | 30 days supply | Qty: 60 | Fill #3

## 2016-10-18 MED FILL — LISINOPRIL-HCTZ 10-12.5 MG: 10-12.5 | 30 days supply | Qty: 30 | Fill #4

## 2016-11-17 ENCOUNTER — Other Ambulatory Visit: Payer: Self-pay | Admitting: Family Medicine

## 2016-11-17 MED FILL — ?CLOPIDOGREL 75MG TAB: 75 | 30 days supply | Qty: 30 | Fill #4

## 2016-11-17 MED FILL — METOPROLOL SUCC ER 25 MG TA: 25 | 30 days supply | Qty: 30 | Fill #4

## 2016-11-17 MED FILL — AMITRIPTYLINE HCL 50 MG TAB: 50 | 30 days supply | Qty: 90 | Fill #0

## 2016-11-17 MED FILL — LISINOPRIL-HCTZ 10-12.5 MG: 10-12.5 | 30 days supply | Qty: 30 | Fill #5

## 2016-11-17 MED FILL — GABAPENTIN 600 MG TABLET: 600 | 30 days supply | Qty: 60 | Fill #4

## 2016-11-23 ENCOUNTER — Ambulatory Visit: Payer: Self-pay | Attending: Internal Medicine

## 2016-12-24 ENCOUNTER — Other Ambulatory Visit: Payer: Self-pay | Admitting: Family Medicine

## 2016-12-24 ENCOUNTER — Ambulatory Visit (INDEPENDENT_AMBULATORY_CARE_PROVIDER_SITE_OTHER): Payer: Self-pay | Admitting: Family Medicine

## 2016-12-24 ENCOUNTER — Encounter: Payer: Self-pay | Admitting: Family Medicine

## 2016-12-24 VITALS — BP 114/84 | HR 60 | Temp 97.8°F | Resp 14 | Ht 72.0 in | Wt 240.0 lb

## 2016-12-24 DIAGNOSIS — I10 Essential (primary) hypertension: Secondary | ICD-10-CM

## 2016-12-24 DIAGNOSIS — A64 Unspecified sexually transmitted disease: Secondary | ICD-10-CM

## 2016-12-24 DIAGNOSIS — I639 Cerebral infarction, unspecified: Secondary | ICD-10-CM

## 2016-12-24 DIAGNOSIS — K4090 Unilateral inguinal hernia, without obstruction or gangrene, not specified as recurrent: Secondary | ICD-10-CM

## 2016-12-24 DIAGNOSIS — E785 Hyperlipidemia, unspecified: Secondary | ICD-10-CM

## 2016-12-24 MED ORDER — MUPIROCIN 2 % EX OINT: 1.0000 "application " | TOPICAL_OINTMENT | Freq: Three times a day (TID) | CUTANEOUS | 0 refills | Status: DC

## 2016-12-24 NOTE — Progress Notes (Signed)
Patient ID: Aaron Kennedy, male    DOB: [birth year], 46 y.o.   MRN: [id number]  PCP: Aaron Maroon, FNP  Chief Complaint  Patient presents with  . Follow-up    3 month     Subjective:  HPI Aaron Kennedy is a 46 y.o. male presents for a routine 3 month evaluation. Medical history significant for prediabetes, hypertension, CVA, tobacco use, and hyperlipidemia. Aaron Kennedy reports chronic right sided weakness and numbness since CVA in 03/2015. He reports decrease in visual acuity and due to lack of health insurance has not obtained an opthalmic exam. Aaron Kennedy was found to have prediabetes, last A1C 5.9.  He reports poor dietary habits and does not engage in regular routine exercise. He has attempted to cut back on smoking and is down to 1/2 pack habit per day. He doesn't routinely monitor blood pressure, although has remained stable during prior visits. [name] complains of small vesicular rash along the groin region, reports appearance is similar to prior MRSA infection and request medication for lesions.[name] has a chronicright inguinal hernia. He reports some occasional pain at the site of hernia with coughing. He has been uninsured and therefore unable to see a Careers adviser for consult. [name] requests STI testing today. Denies any recent known exposures. Social History   Social History  . Marital status: Single    Spouse name: N/A  . Number of children: N/A  . Years of education: N/A   Occupational History  . Not on file.   Social History Main Topics  . Smoking status: Current Every Day Smoker    Packs/day: 0.50    Years: 20.00    Types: Cigarettes  . Smokeless tobacco: Never Used  . Alcohol use No  . Drug use: Yes    Types: Marijuana  . Sexual activity: Not on file   Other Topics Concern  . Not on file   Social History Narrative  . No narrative on file    Family History  Problem Relation Age of Onset  . Stroke Brother        15s  . Heart attack Sister        42s    Review of Systems See HPI  Patient Active Problem List   Diagnosis Date Noted  . Chronic pain of right knee 04/22/2016  . PAD (peripheral artery disease) (HCC) 05/02/2015  . Stroke (cerebrum) (HCC) 04/08/2015  . 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 CVA (cerebral vascular accident) (HCC) 04/03/2015  . Seizures (HCC) 04/03/2015  . Stroke (HCC) 03/22/2015  . HTN (hypertension) 03/22/2015  . HLD (hyperlipidemia) 03/22/2015  . Prediabetes 03/22/2015  . Tobacco use disorder 03/22/2015  . [name] (transient ischemic attack) 03/18/2015  . History of fracture of ankle   . Right ankle pain     No Known Allergies  Prior to Admission medications   Medication Sig Start Date End Date Taking? Authorizing Provider  amitriptyline (ELAVIL) 50 MG tablet Take 3 tablets (150 mg total) by mouth at bedtime. 06/22/16   Aaron Maroon, FNP  amitriptyline (ELAVIL) 50 MG tablet TAKE THREE TABLETS BY MOUTH AT BEDTIME. 11/17/16   Aaron Maroon, FNP  atorvastatin (LIPITOR) 40 MG tablet TAKE 1 TABLET BY MOUTH DAILY AT 6 PM. 06/22/16   Aaron Maroon, FNP  clopidogrel (PLAVIX) 75 MG tablet Take 1 tablet (75 mg total) by mouth daily. 06/22/16   Aaron Maroon, FNP  gabapentin (NEURONTIN) 600 MG tablet Take 2 tablets (1,200 mg total)  by mouth daily. 06/22/16   Aaron Maroon, FNP  lisinopril-hydrochlorothiazide (PRINZIDE,ZESTORETIC) 10-12.5 MG tablet Take 1 tablet by mouth daily. 06/22/16   Aaron Maroon, FNP  metoprolol succinate (TOPROL-XL) 25 MG 24 hr tablet Take 1 tablet (25 mg total) by mouth daily. 06/22/16   Aaron Maroon, FNP    Past Medical, Surgical Family and Social History reviewed and updated.    Objective:   Today's Vitals   12/24/16 1341  BP: 114/84  Pulse: 60  Resp: 14  Temp: 97.8 F (36.6 C)  TempSrc: Oral  SpO2: 97%  Weight: 240 lb (108.9 kg)  Height: 6' (1.829 m)     Wt Readings from Last 3 Encounters:  09/23/16 243 lb (110.2 kg)  06/22/16  242 lb (109.8 kg)  04/22/16 235 lb (106.6 kg)   Physical Exam  Constitutional: He is oriented to person, place, and time. He appears well-developed and well-nourished.  HENT:  Head: Normocephalic and atraumatic.  Eyes: Pupils are equal, round, and reactive to light. Conjunctivae and EOM are normal.  Neck: Normal range of motion. Neck supple.  Cardiovascular: Normal rate, regular rhythm, normal heart sounds and intact distal pulses.   Pulmonary/Chest: Effort normal and breath sounds normal.  Musculoskeletal: Normal range of motion.  Neurological: He is alert and oriented to person, place, and time.  Skin: Skin is warm and dry.  Psychiatric: He has a normal mood and affect. His behavior is normal. Judgment and thought content normal.   Assessment & Plan:  1. Cerebrovascular accident (CVA), unspecified mechanism (HCC)-Continue Plavix, Lipitor, and good BP control  2. Essential hypertension, Controlled. Continue current regimen  3. STI (sexually transmitted infection) 4. Hyperlipidemia, unspecified hyperlipidemia type,continue atorvastatin  5.Prediabetes, repeat A1C today  6. Inguinal hernia, Unilateral -referral placed to General Surgery  Encouraged smoking cessations , discontinue intake of foods and drinks high in simple sugar, and increase physical activity as tolerated to improve overall cardiovascular health.  Orders Placed This Encounter  Procedures  . C. trachomatis/N. gonorrhoeae RNA  . CBC with Differential  . Hemoglobin A1c  . COMPLETE METABOLIC PANEL WITH GFR  . Lipid panel  . Thyroid Panel With TSH  . HSV(herpes simplex vrs) 1+2 ab-IgG  . HIV antibody  . RPR  . EKG 12-Lead   Meds ordered this encounter  Medications  . mupirocin ointment (BACTROBAN) 2 %    Sig: Apply 1 application topically 3 (three) times daily.    Dispense:  30 g    Refill:  0    Order Specific Question:   Supervising Provider    Answer:   Quentin Angst [id number]  . metFORMIN  (GLUCOPHAGE) 500 MG tablet    Sig: Take 1 tablet (500 mg total) by mouth 2 (two) times daily with a meal.    Dispense:  180 tablet    Refill:  3    Order Specific Question:   Supervising Provider    Answer:   Quentin Angst [id number]  . atorvastatin (LIPITOR) 80 MG tablet    Sig: TAKE 1 TABLET BY MOUTH DAILY AT 6 PM.    Dispense:  30 tablet    Refill:  5    Dose change    Order Specific Question:   Supervising Provider    Answer:   Quentin Angst [[id number]]   RTC: 3 months for follow-up chronic conditions    Godfrey Pick. Tiburcio Pea, MSN, FNP-C [facility]  [address] [place] [place], [place]  [address] [phone]    

## 2016-12-25 LAB — COMPLETE METABOLIC PANEL WITH GFR
AG RATIO: 1.7 (calc) (ref 1.0–2.5)
ALKALINE PHOSPHATASE (APISO): 112 U/L (ref 40–115)
ALT: 33 U/L (ref 9–46)
AST: 17 U/L (ref 10–40)
Albumin: 4.1 g/dL (ref 3.6–5.1)
BILIRUBIN TOTAL: 0.4 mg/dL (ref 0.2–1.2)
BUN: 11 mg/dL (ref 7–25)
CALCIUM: 8.9 mg/dL (ref 8.6–10.3)
CHLORIDE: 102 mmol/L (ref 98–110)
CO2: 28 mmol/L (ref 20–32)
Creat: 1.11 mg/dL (ref 0.60–1.35)
GFR, Est African American: 92 mL/min/{1.73_m2} (ref >=60)
GFR, Est Non African American: 79 mL/min/{1.73_m2} (ref >=60)
GLOBULIN: 2.4 g/dL (ref 1.9–3.7)
Glucose, Bld: 79 mg/dL (ref 65–99)
POTASSIUM: 3.9 mmol/L (ref 3.5–5.3)
SODIUM: 137 mmol/L (ref 135–146)
Total Protein: 6.5 g/dL (ref 6.1–8.1)

## 2016-12-25 LAB — C. TRACHOMATIS/N. GONORRHOEAE RNA
C. trachomatis RNA, TMA: NOT DETECTED
N. gonorrhoeae RNA, TMA: NOT DETECTED

## 2016-12-25 LAB — CBC WITH DIFFERENTIAL/PLATELET
Basophils Absolute: 53 cells/uL (ref 0–200)
Basophils Relative: 0.5 %
EOS ABS: 189 {cells}/uL (ref 15–500)
Eosinophils Relative: 1.8 %
HCT: 45.7 % (ref 38.5–50.0)
HEMOGLOBIN: 16.1 g/dL (ref 13.2–17.1)
Lymphs Abs: 3885 cells/uL (ref 850–3900)
MCH: 33.8 pg — AB (ref 27.0–33.0)
MCHC: 35.2 g/dL (ref 32.0–36.0)
MCV: 95.8 fL (ref 80.0–100.0)
MPV: 9.6 fL (ref 7.5–12.5)
Monocytes Relative: 8.4 %
NEUTROS ABS: 5492 {cells}/uL (ref 1500–7800)
Neutrophils Relative %: 52.3 %
PLATELETS: 231 10*3/uL (ref 140–400)
RBC: 4.77 10*6/uL (ref 4.20–5.80)
RDW: 13.1 % (ref 11.0–15.0)
Total Lymphocyte: 37 %
WBC: 10.5 10*3/uL (ref 3.8–10.8)
WBCMIX: 882 {cells}/uL (ref 200–950)

## 2016-12-25 LAB — THYROID PANEL WITH TSH
Free Thyroxine Index: 2.6 (ref 1.4–3.8)
T3 UPTAKE: 30 % (ref 22–35)
T4, Total: 8.8 ug/dL (ref 4.9–10.5)
TSH: 2.31 m[IU]/L (ref 0.40–4.50)

## 2016-12-25 LAB — HEMOGLOBIN A1C
EAG (MMOL/L): 8.2 (calc)
Hgb A1c MFr Bld: 6.8 % of total Hgb — ABNORMAL HIGH (ref <5.7)
MEAN PLASMA GLUCOSE: 148 (calc)

## 2016-12-25 LAB — LIPID PANEL
CHOLESTEROL: 181 mg/dL (ref <200)
HDL: 24 mg/dL — AB (ref >40)
LDL Cholesterol (Calc): 125 mg/dL (calc) — ABNORMAL HIGH
NON-HDL CHOLESTEROL (CALC): 157 mg/dL — AB (ref <130)
TRIGLYCERIDES: 204 mg/dL — AB (ref <150)
Total CHOL/HDL Ratio: 7.5 (calc) — ABNORMAL HIGH (ref <5.0)

## 2016-12-27 LAB — HSV(HERPES SIMPLEX VRS) I + II AB-IGG
HSV 1 IGG, TYPE SPEC: 40 {index} — AB
HSV 2 IGG, TYPE SPEC: 6.76 {index} — AB

## 2016-12-27 LAB — HIV ANTIBODY (ROUTINE TESTING W REFLEX): HIV: NONREACTIVE

## 2016-12-27 LAB — RPR: RPR: NONREACTIVE

## 2016-12-30 ENCOUNTER — Telehealth: Payer: Self-pay | Admitting: Family Medicine

## 2016-12-30 MED ORDER — METFORMIN HCL 500 MG PO TABS
500.0000 mg | ORAL_TABLET | Freq: Two times a day (BID) | ORAL | 3 refills | Status: DC
Start: 2016-12-30 — End: 2020-03-24

## 2016-12-30 MED ORDER — ATORVASTATIN CALCIUM 80 MG PO TABS: ORAL_TABLET | ORAL | 5 refills | Status: DC

## 2016-12-30 NOTE — Telephone Encounter (Signed)
Hemoglobin A1c elevated 6.8, due to existing comorbid conditions such as history of CVA, hyperlipidemia, hypertension, recommend starting Metformin 500 mg twice daily for diabetes management.  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 Although HSV-1 can also be contracted through oral sex and therefore can present as genital herpes. If any lesions appear, please return to the office for evaluation and treatment. Encouraged use condoms with all sexual encounter to prevent STD transmission.  Lipid panel (cholesterol) continues to remain elevated. Increasing Lipitor 80 mg once daily.  Godfrey Pick. Tiburcio Pea, MSN, FNP-C [facility]  [address] [place], [place] [address] [phone]

## 2017-01-03 NOTE — Telephone Encounter (Signed)
Called and spoke with patient. Advised that a1c was 6.8 and that we recommend him starting metformin 500mg  twice daily. 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 Encouraged him to use condoms with sexual intercourse. Advised that cholesterol is still elevated and that the lipitor is being increased to 80mg  once daily. Patient verbalized understanding. Thanks!

## 2017-01-05 MED FILL — METOPROLOL SUCC ER 25 MG TA: 25 | 30 days supply | Qty: 30 | Fill #5

## 2017-01-05 MED FILL — LISINOPRIL-HCTZ 10-12.5 MG: 10-12.5 | 30 days supply | Qty: 30 | Fill #6

## 2017-01-05 MED FILL — CLOPIDOGREL 75 MG TABLET: 75 | 30 days supply | Qty: 30 | Fill #5

## 2017-01-10 MED FILL — ATORVASTATIN 80 MG TABLET: 80 | 30 days supply | Qty: 30 | Fill #0

## 2017-01-10 MED FILL — MUPIROCIN 2% OINTMENT: 2 | 7 days supply | Qty: 22 | Fill #0

## 2017-01-10 MED FILL — ?AMITRIPTYLINE HCL 50MG TAB: 50 | 30 days supply | Qty: 90 | Fill #0

## 2017-01-12 MED FILL — ?METFORMIN HCL 500MG TABLET: 500 | 30 days supply | Qty: 60 | Fill #0

## 2017-02-11 ENCOUNTER — Other Ambulatory Visit: Payer: Self-pay | Admitting: Family Medicine

## 2017-02-11 ENCOUNTER — Telehealth: Payer: Self-pay

## 2017-02-11 DIAGNOSIS — I10 Essential (primary) hypertension: Secondary | ICD-10-CM

## 2017-02-11 DIAGNOSIS — I6302 Cerebral infarction due to thrombosis of basilar artery: Secondary | ICD-10-CM

## 2017-02-11 DIAGNOSIS — G4700 Insomnia, unspecified: Secondary | ICD-10-CM

## 2017-02-11 MED ORDER — CLOPIDOGREL BISULFATE 75 MG PO TABS: 75.0000 mg | ORAL_TABLET | Freq: Every day | ORAL | 5 refills | Status: DC

## 2017-02-11 MED ORDER — LISINOPRIL-HYDROCHLOROTHIAZIDE 10-12.5 MG PO TABS: 1.0000 | ORAL_TABLET | Freq: Every day | ORAL | 3 refills | Status: DC

## 2017-02-11 MED ORDER — AMITRIPTYLINE HCL 50 MG PO TABS: 150.0000 mg | ORAL_TABLET | Freq: Every day | ORAL | 0 refills | Status: DC

## 2017-02-11 MED FILL — LISINOPRIL-HCTZ 10-12.5 MG: 10-12.5 | 30 days supply | Qty: 30 | Fill #7

## 2017-02-11 MED FILL — ?AMITRIPTYLINE HCL 50MG TAB: 50 | 30 days supply | Qty: 90 | Fill #0

## 2017-02-11 MED FILL — METOPROLOL SUCC ER 25 MG TA: 25 | 30 days supply | Qty: 30 | Fill #0

## 2017-02-11 NOTE — Telephone Encounter (Signed)
Refills sent into pharmacy. Thanks!  

## 2017-03-24 ENCOUNTER — Other Ambulatory Visit: Payer: Self-pay | Admitting: Family Medicine

## 2017-03-24 MED FILL — GABAPENTIN 600 MG TABLET: 600 | 30 days supply | Qty: 60 | Fill #5

## 2017-03-24 MED FILL — ?METFORMIN HCL 500MG TABLET: 500 | 30 days supply | Qty: 60 | Fill #1

## 2017-03-24 MED FILL — METOPROLOL SUCC ER 25 MG TA: 25 | 30 days supply | Qty: 30 | Fill #1

## 2017-03-24 MED FILL — ?AMITRIPTYLINE HCL 50MG TAB: 50 | 30 days supply | Qty: 90 | Fill #0

## 2017-03-24 MED FILL — LISINOPRIL-HCTZ 10-12.5 MG: 10-12.5 | 30 days supply | Qty: 30 | Fill #8

## 2017-03-24 MED FILL — ATORVASTATIN 80 MG TABLET: 80 | 30 days supply | Qty: 30 | Fill #1

## 2017-03-25 ENCOUNTER — Encounter: Payer: Self-pay | Admitting: Family Medicine

## 2017-03-25 ENCOUNTER — Ambulatory Visit: Payer: No Typology Code available for payment source | Admitting: Family Medicine

## 2017-03-25 VITALS — BP 138/92 | HR 115 | Temp 98.4°F | Resp 16 | Ht 72.0 in | Wt 238.0 lb

## 2017-03-25 DIAGNOSIS — I10 Essential (primary) hypertension: Secondary | ICD-10-CM

## 2017-03-25 DIAGNOSIS — F17200 Nicotine dependence, unspecified, uncomplicated: Secondary | ICD-10-CM

## 2017-03-25 DIAGNOSIS — I6930 Unspecified sequelae of cerebral infarction: Secondary | ICD-10-CM

## 2017-03-25 DIAGNOSIS — E785 Hyperlipidemia, unspecified: Secondary | ICD-10-CM

## 2017-03-25 DIAGNOSIS — M25561 Pain in right knee: Secondary | ICD-10-CM

## 2017-03-25 DIAGNOSIS — G8929 Other chronic pain: Secondary | ICD-10-CM

## 2017-03-25 DIAGNOSIS — R7303 Prediabetes: Secondary | ICD-10-CM

## 2017-03-25 LAB — POCT URINALYSIS DIP (DEVICE)
Bilirubin Urine: NEGATIVE
Glucose, UA: 100 mg/dL — AB
HGB URINE DIPSTICK: NEGATIVE
Ketones, ur: NEGATIVE mg/dL
Leukocytes, UA: NEGATIVE
NITRITE: NEGATIVE
PH: 7 (ref 5.0–8.0)
Protein, ur: NEGATIVE mg/dL
Specific Gravity, Urine: 1.02 (ref 1.005–1.030)
UROBILINOGEN UA: 0.2 mg/dL (ref 0.0–1.0)

## 2017-03-25 LAB — POCT GLYCOSYLATED HEMOGLOBIN (HGB A1C): Hemoglobin A1C: 6.6

## 2017-03-25 NOTE — Patient Instructions (Addendum)
You warrant a physical therapy referral for further workup and evaluation due to deficits from previous [name].  All vitals are within normal range no medication changes warranted on today.  Your blood pressure is at goal.  We will follow-up by phone with any abnormal laboratory results hours. This therapy is usually done at a hyperbaric center or hospital.

## 2017-03-25 NOTE — Progress Notes (Signed)
Chief Complaint  Patient presents with  . Hypertension  . Follow-up    pre-diabetes    Subjective:    Patient ID: Aaron Kennedy, male    DOB: Jul 22, 1970, 46 y.o.   MRN: [id number]  HPI Mr. Aaron Kennedy, a 46 year old male with a history of hypertension, CVA, prediabetes, presents for a follow up of chronic conditions.  Mr. Aaron Kennedy says that he has been taking medications consistently.  Mr. Aaron Kennedy history includes a CVA in November 2016. 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 He did not complete physical therapy and has been unable to follow up with neurology due to lack of payer source. Aaron Kennedy has been taking Aaron Kennedy consistently  He continues to be an everyday tobacco user. He is not interested in smoking cessation at this time.   Mr. Aaron Kennedy has a history of hypertension and prediabetes. He is not exercising routinely and is not adherent to low salt diet.  Blood pressure is well controlled at home.  Patient denies chest pain, dyspnea, fatigue, near-syncope, orthopnea, palpitations, syncope and tachypnea.  Cardiovascular risk factors includes: obesity (BMI >= 30 kg/m2), sedentary lifestyle and smoking/ tobacco exposure.   Past Medical History:  Diagnosis Date  . Stroke [facility])    Immunization History  Administered Date(s) Administered  . Influenza,inj,Quad PF,6+ Mos 12/29/2015  . Pneumococcal Polysaccharide-23 09/26/2015  . Tdap 12/29/2015   Social History   Socioeconomic History  . Marital status: Single    Spouse name: Not on file  . Number of children: Not on file  . Years of education: Not on file  . Highest education level: Not on file  Social Needs  . Financial resource strain: Not on file  . Food insecurity - worry: Not on file  . Food insecurity - inability: Not on file  . Transportation needs - medical: Not on file  . Transportation needs - non-medical: Not on file  Occupational History  . Not on file  Tobacco Use  . Smoking  status: Current Every Day Smoker    Packs/day: 0.50    Years: 20.00    Pack years: 10.00    Types: Cigarettes  . Smokeless tobacco: Never Used  Substance and Sexual Activity  . Alcohol use: No  . Drug use: Yes    Types: Marijuana  . Sexual activity: Not on file  Other Topics Concern  . Not on file  Social History Narrative  . Not on file   Review of Systems  Constitutional: Negative.  Negative for fatigue and fever.  Eyes: Negative.   Respiratory: Negative.   Cardiovascular: Negative for chest pain, palpitations and leg swelling.  Endocrine: Negative.  Negative for polydipsia, polyphagia and polyuria.  Genitourinary: Negative.   Musculoskeletal: Positive for myalgias (right knee pain).  Skin: Negative.   Allergic/Immunologic: Negative for immunocompromised state.  Neurological: Negative.  Negative for weakness.  Hematological: Negative.   Psychiatric/Behavioral: Negative for self-injury, sleep disturbance and suicidal ideas. The patient is not nervous/anxious and is not hyperactive.        Objective:   Physical Exam  Constitutional: He is oriented to person, place, and time.  HENT:  Head: Normocephalic and atraumatic.  Right Ear: External ear normal.  Left Ear: External ear normal.  Nose: Nose normal.  Mouth/Throat: Oropharynx is clear and moist. Abnormal dentition. Dental caries present.  Eyes: Conjunctivae and EOM are normal. Pupils are equal, round, and reactive to light.  Neck: Normal range of motion. Neck supple.  Cardiovascular: Normal  rate, regular rhythm, normal heart sounds and intact distal pulses.  Pulmonary/Chest: Effort normal and breath sounds normal.  Abdominal: Soft. Bowel sounds are normal.  Musculoskeletal: Normal range of motion.  Neurological: He is alert and oriented to person, place, and time. He has normal reflexes.  Skin: Skin is warm and dry.  Psychiatric: He has a normal mood and affect. His behavior is normal. Judgment and thought content  normal.      BP (!) 138/92 (BP Location: Right Arm, Patient Position: Sitting, Cuff Size: Large) Comment: manually  Pulse (!) 115   Temp 98.4 F (36.9 C) (Oral)   Resp 16   Ht 6' (1.829 m)   Wt 238 lb (108 kg)   SpO2 94%   BMI 32.28 kg/m  Assessment & Plan:  1. Prediabetes Hemoglobin a1C is 6.6, which is consistent with type 2 diabetes mellitus.Will continue Metformin 500 mg every morning with breakfast.  Recommend a lowfat, low carbohydrate diet divided over 5-6 small meals, increase water intake to 6-8 glasses, and 150 minutes per week of cardiovascular exercise.    - HgB A1c - CMP and Liver - POCT urinalysis dip (device)  2. Chronic pain of right knee Continue gabapentin 600 mg BID  3. Tobacco use disorder Smoking cessation instruction/counseling given:  counseled patient on the dangers of tobacco use, advised patient to stop smoking, and reviewed strategies to maximize success  4. Essential hypertension Blood pressure is at goal on current medication regimen. No medication changes warranted on today.   We have discussed target BP range and blood pressure goal. I have advised patient to check BP regularly and to call us back or report to clinic if the numbers are consistently higher than 140/90. We discussed the importance of compliance with medical therapy and DASH diet recommended, consequences of uncontrolled hypertension discussed.  - continue current BP medications  - HgB A1c - CMP and Liver  5. Hyperlipidemia, unspecified hyperlipidemia type - CMP and Liver  6. History of CVA with residual deficit Continue Aaron Kennedy 75 mh daily   RTC: 6 months for chronic conditions   Aaron NationsLachina Moore Oluwafemi Villella  MSN, FNP-C [facility] [address]  [place], [place] [id number] [phone]

## 2017-03-26 LAB — CMP AND LIVER
ALT: 27 IU/L (ref 0–44)
AST: 15 IU/L (ref 0–40)
Albumin: 4.2 g/dL (ref 3.5–5.5)
Alkaline Phosphatase: 118 IU/L — ABNORMAL HIGH (ref 39–117)
BILIRUBIN, DIRECT: 0.08 mg/dL (ref 0.00–0.40)
BUN: 10 mg/dL (ref 6–24)
Bilirubin Total: 0.3 mg/dL (ref 0.0–1.2)
CALCIUM: 8.8 mg/dL (ref 8.7–10.2)
CO2: 19 mmol/L — AB (ref 20–29)
Chloride: 105 mmol/L (ref 96–106)
Creatinine, Ser: 0.98 mg/dL (ref 0.76–1.27)
GFR calc non Af Amer: 92 mL/min/{1.73_m2} (ref >59)
GFR, EST AFRICAN AMERICAN: 106 mL/min/{1.73_m2} (ref >59)
GLUCOSE: 137 mg/dL — AB (ref 65–99)
Potassium: 4.4 mmol/L (ref 3.5–5.2)
Sodium: 142 mmol/L (ref 134–144)
Total Protein: 6.9 g/dL (ref 6.0–8.5)

## 2017-03-31 ENCOUNTER — Other Ambulatory Visit: Payer: Self-pay | Admitting: Family Medicine

## 2017-03-31 DIAGNOSIS — I6302 Cerebral infarction due to thrombosis of basilar artery: Secondary | ICD-10-CM

## 2017-04-20 ENCOUNTER — Other Ambulatory Visit: Payer: Self-pay

## 2017-04-20 ENCOUNTER — Emergency Department (HOSPITAL_BASED_OUTPATIENT_CLINIC_OR_DEPARTMENT_OTHER)
Admission: EM | Admit: 2017-04-20 | Discharge: 2017-04-20 | Disposition: A | Discharge status: Home / Self Care | Payer: Self-pay | Attending: Emergency Medicine | Admitting: Emergency Medicine

## 2017-04-20 ENCOUNTER — Encounter (HOSPITAL_BASED_OUTPATIENT_CLINIC_OR_DEPARTMENT_OTHER): Payer: Self-pay

## 2017-04-20 ENCOUNTER — Emergency Department (HOSPITAL_BASED_OUTPATIENT_CLINIC_OR_DEPARTMENT_OTHER): Payer: Self-pay

## 2017-04-20 DIAGNOSIS — B349 Viral infection, unspecified: Secondary | ICD-10-CM | POA: Insufficient documentation

## 2017-04-20 DIAGNOSIS — I5042 Chronic combined systolic (congestive) and diastolic (congestive) heart failure: Secondary | ICD-10-CM | POA: Insufficient documentation

## 2017-04-20 DIAGNOSIS — Z79899 Other long term (current) drug therapy: Secondary | ICD-10-CM | POA: Insufficient documentation

## 2017-04-20 DIAGNOSIS — J069 Acute upper respiratory infection, unspecified: Secondary | ICD-10-CM | POA: Insufficient documentation

## 2017-04-20 DIAGNOSIS — Z7902 Long term (current) use of antithrombotics/antiplatelets: Secondary | ICD-10-CM | POA: Insufficient documentation

## 2017-04-20 DIAGNOSIS — I110 Hypertensive heart disease with heart failure: Secondary | ICD-10-CM | POA: Insufficient documentation

## 2017-04-20 DIAGNOSIS — F17210 Nicotine dependence, cigarettes, uncomplicated: Secondary | ICD-10-CM | POA: Insufficient documentation

## 2017-04-20 DIAGNOSIS — B9789 Other viral agents as the cause of diseases classified elsewhere: Secondary | ICD-10-CM

## 2017-04-20 DIAGNOSIS — Z8673 Personal history of transient ischemic attack (TIA), and cerebral infarction without residual deficits: Secondary | ICD-10-CM | POA: Insufficient documentation

## 2017-04-20 DIAGNOSIS — Z7984 Long term (current) use of oral hypoglycemic drugs: Secondary | ICD-10-CM | POA: Insufficient documentation

## 2017-04-20 MED ORDER — ACETAMINOPHEN 500 MG PO TABS
1000.0000 mg | ORAL_TABLET | Freq: Once | ORAL | Status: AC
  Administered 2017-04-20: 1000 mg via ORAL
  Filled 2017-04-20: qty 2

## 2017-04-20 NOTE — ED Provider Notes (Signed)
[facility] EMERGENCY DEPARTMENT Provider Note   CSN: [id number] Arrival date & time: 04/20/17  2012     History   Chief Complaint Chief Complaint  Patient presents with  . Cough    HPI Aaron Kennedy is a 47 y.o. male w PMHx CVA, HTN, HLD, presenting to ED with 3 days of gradual onset congestion, sore throat, and cough. No medications tried for symptoms. States symptoms initially with nasal congestion and sore throat, however noticed cough that began today.  States cough is productive of clear phlegm.  Denies difficulty breathing or swallowing, fever/chills, ear pain, shortness of breath, chest pain, or any other complaints.  The history is provided by the patient.    Past Medical History:  Diagnosis Date  . Stroke [facility])     Patient Active Problem List   Diagnosis Date Noted  . Chronic pain of right knee 04/22/2016  . PAD (peripheral artery disease) (HCC) 05/02/2015  . Stroke (cerebrum) (HCC) 04/08/2015  . 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 CVA (cerebral vascular accident) (HCC) 04/03/2015  . Seizures (HCC) 04/03/2015  . Stroke (HCC) 03/22/2015  . HTN (hypertension) 03/22/2015  . HLD (hyperlipidemia) 03/22/2015  . Prediabetes 03/22/2015  . Tobacco use disorder 03/22/2015  . [name] (transient ischemic attack) 03/18/2015  . History of fracture of ankle   . Right ankle pain     Past Surgical History:  Procedure Laterality Date  . HERNIA REPAIR    . LEG SURGERY         Home Medications    Prior to Admission medications   Medication Sig Start Date End Date Taking? Authorizing Provider  amitriptyline (ELAVIL) 50 MG tablet Take 3 tablets (150 mg total) by mouth at bedtime. 02/11/17   Massie Maroon, FNP  atorvastatin (LIPITOR) 80 MG tablet TAKE 1 TABLET BY MOUTH DAILY AT 6 PM. 12/30/16   Bing Neighbors, FNP  clopidogrel (PLAVIX) 75 MG tablet Take 1 tablet (75 mg total) by mouth daily. 02/11/17   Massie Maroon, FNP    clopidogrel (PLAVIX) 75 MG tablet TAKE 1 TABLET BY MOUTH DAILY. 03/31/17   Massie Maroon, FNP  gabapentin (NEURONTIN) 600 MG tablet Take 2 tablets (1,200 mg total) by mouth daily. 06/22/16   Massie Maroon, FNP  lisinopril-hydrochlorothiazide (PRINZIDE,ZESTORETIC) 10-12.5 MG tablet Take 1 tablet by mouth daily. 02/11/17   Massie Maroon, FNP  metFORMIN (GLUCOPHAGE) 500 MG tablet Take 1 tablet (500 mg total) by mouth 2 (two) times daily with a meal. 12/30/16   Bing Neighbors, FNP  metoprolol succinate (TOPROL-XL) 25 MG 24 hr tablet TAKE 1 TABLET BY MOUTH DAILY. 02/11/17   Massie Maroon, FNP  mupirocin ointment (BACTROBAN) 2 % Apply 1 application topically 3 (three) times daily. 12/24/16   Bing Neighbors, FNP    Family History Family History  Problem Relation Age of Onset  . Stroke Brother        57s  . Heart attack Sister        86s    Social History Social History   Tobacco Use  . Smoking status: Current Every Day Smoker    Packs/day: 0.50    Years: 20.00    Pack years: 10.00    Types: Cigarettes  . Smokeless tobacco: Never Used  Substance Use Topics  . Alcohol use: No  . Drug use: Yes    Types: Marijuana     Allergies   Patient has no known allergies.  Review of Systems Review of Systems  Constitutional: Negative for fever.  HENT: Positive for congestion and sore throat. Negative for ear pain, trouble swallowing and voice change.   Respiratory: Positive for cough. Negative for shortness of breath.   Cardiovascular: Negative for chest pain.  All other systems reviewed and are negative.    Physical Exam Updated Vital Signs BP 127/79 (BP Location: Right Arm)   Pulse 92   Temp 99 F (37.2 C) (Oral)   Resp 18   Ht 6' (1.829 m)   Wt 110.2 kg (243 lb)   SpO2 96%   BMI 32.96 kg/m   Physical Exam  Constitutional: He appears well-developed and well-nourished. No distress.  HENT:  Head: Normocephalic and atraumatic.  Right Ear: Tympanic  membrane, external ear and ear canal normal.  Left Ear: Tympanic membrane, external ear and ear canal normal.  Mouth/Throat: Uvula is midline. No trismus in the jaw. No uvula swelling. Posterior oropharyngeal erythema present. No posterior oropharyngeal edema.  Nose is congested. Tolerating secretions. No pharyngeal exudates.  Eyes: Conjunctivae are normal.  Neck: Normal range of motion. Neck supple.  Cardiovascular: Normal rate, regular rhythm, normal heart sounds and intact distal pulses.  Pulmonary/Chest: Effort normal and breath sounds normal. No stridor. No respiratory distress. He has no wheezes. He has no rales.  Abdominal: Soft.  Lymphadenopathy:    He has no cervical adenopathy.  Neurological: He is alert.  Skin: Skin is warm.  Psychiatric: He has a normal mood and affect. His behavior is normal.  Nursing note and vitals reviewed.    ED Treatments / Results  Labs (all labs ordered are listed, but only abnormal results are displayed) Labs Reviewed - No data to display  EKG  EKG Interpretation None       Radiology Dg Chest 2 View  Result Date: 04/20/2017 CLINICAL DATA:  Cough. EXAM: CHEST  2 VIEW COMPARISON:  03/18/2015 FINDINGS: The heart size and mediastinal contours are within normal limits. Both lungs are clear. The visualized skeletal structures are unremarkable. IMPRESSION: No active cardiopulmonary disease. Electronically Signed   By: Burman Nieves M.D.   On: 04/20/2017 22:01    Procedures Procedures (including critical care time)  Medications Ordered in ED Medications  acetaminophen (TYLENOL) tablet 1,000 mg (1,000 mg Oral Given 04/20/17 2237)     Initial Impression / Assessment and Plan / ED Course  I have reviewed the triage vital signs and the nursing notes.  Pertinent labs & imaging results that were available during my care of the patient were reviewed by me and considered in my medical decision making (see chart for details).     Patients  symptoms are consistent with URI, likely viral etiology. Pt CXR negative for acute infiltrate. Lungs CTAB, tolerating secretions. Discussed that antibiotics are not indicated for viral infections. Pt will be discharged with symptomatic treatment.  Verbalizes understanding and is agreeable with plan. Pt is hemodynamically stable & in NAD prior to dc.  Discussed results, findings, treatment and follow up. Patient advised of return precautions. Patient verbalized understanding and agreed with plan.  Final Clinical Impressions(s) / ED Diagnoses   Final diagnoses:  Viral URI with cough    ED Discharge Orders    None       Robinson, [place] N, PA-C 04/20/17 2239    Little, Ambrose [place], MD 04/22/17 1453

## 2017-04-20 NOTE — ED Triage Notes (Signed)
C/o flu like sx day 2-NAD-steady gait 

## 2017-04-20 NOTE — Discharge Instructions (Signed)
Please read instructions below. You can take tylenol as needed for sore throat or body aches. Drink plenty of water. Follow up with your primary care provider as needed. Return to the ER for difficulty swallowing liquids, difficulty breathing, or new or worsening symptoms. ° °

## 2017-04-20 NOTE — ED Notes (Signed)
ED Provider at bedside. 

## 2017-04-21 ENCOUNTER — Ambulatory Visit: Payer: Self-pay | Admitting: Family Medicine

## 2017-04-28 ENCOUNTER — Other Ambulatory Visit: Payer: Self-pay | Admitting: Family Medicine

## 2017-04-28 DIAGNOSIS — G629 Polyneuropathy, unspecified: Secondary | ICD-10-CM

## 2017-04-28 MED FILL — METOPROLOL SUCCINATE ER 25: 25 | 30 days supply | Qty: 30 | Fill #2

## 2017-04-28 MED FILL — ?METFORMIN HCL 500MG TABLET: 500 | 30 days supply | Qty: 60 | Fill #2

## 2017-04-28 MED FILL — ?AMITRIPTYLINE HCL 50MG TAB: 50 | 30 days supply | Qty: 90 | Fill #0

## 2017-04-28 MED FILL — ATORVASTATIN 80 MG TABLET: 80 | 30 days supply | Qty: 30 | Fill #2

## 2017-04-28 MED FILL — LISINOPRIL-HCTZ 10-12.5 MG: 10-12.5 | 30 days supply | Qty: 30 | Fill #9

## 2017-04-28 MED FILL — ?GABAPENTIN 600 MG TABLET: 600 | 30 days supply | Qty: 60 | Fill #0

## 2017-04-28 MED FILL — ?CLOPIDOGREL 75MG TABLET: 75 | 30 days supply | Qty: 30 | Fill #0

## 2017-05-07 ENCOUNTER — Other Ambulatory Visit: Payer: Self-pay

## 2017-05-07 ENCOUNTER — Observation Stay (HOSPITAL_COMMUNITY): Payer: Self-pay | Admitting: Certified Registered Nurse Anesthetist

## 2017-05-07 ENCOUNTER — Encounter (HOSPITAL_BASED_OUTPATIENT_CLINIC_OR_DEPARTMENT_OTHER): Payer: Self-pay | Admitting: Emergency Medicine

## 2017-05-07 ENCOUNTER — Encounter (HOSPITAL_COMMUNITY)
Admission: EM | Disposition: A | Discharge status: Home / Self Care | Payer: Self-pay | Source: Home / Self Care | Attending: Emergency Medicine

## 2017-05-07 ENCOUNTER — Observation Stay (HOSPITAL_BASED_OUTPATIENT_CLINIC_OR_DEPARTMENT_OTHER)
Admission: EM | Admit: 2017-05-07 | Discharge: 2017-05-08 | Disposition: A | Discharge status: Home / Self Care | Payer: Self-pay | Attending: General Surgery | Admitting: General Surgery

## 2017-05-07 ENCOUNTER — Emergency Department (HOSPITAL_BASED_OUTPATIENT_CLINIC_OR_DEPARTMENT_OTHER): Payer: Self-pay

## 2017-05-07 DIAGNOSIS — G8929 Other chronic pain: Secondary | ICD-10-CM | POA: Insufficient documentation

## 2017-05-07 DIAGNOSIS — K4091 Unilateral inguinal hernia, without obstruction or gangrene, recurrent: Secondary | ICD-10-CM | POA: Diagnosis present

## 2017-05-07 DIAGNOSIS — R7303 Prediabetes: Secondary | ICD-10-CM | POA: Insufficient documentation

## 2017-05-07 DIAGNOSIS — Z9889 Other specified postprocedural states: Secondary | ICD-10-CM

## 2017-05-07 DIAGNOSIS — Z7984 Long term (current) use of oral hypoglycemic drugs: Secondary | ICD-10-CM | POA: Insufficient documentation

## 2017-05-07 DIAGNOSIS — E785 Hyperlipidemia, unspecified: Secondary | ICD-10-CM | POA: Insufficient documentation

## 2017-05-07 DIAGNOSIS — Z79899 Other long term (current) drug therapy: Secondary | ICD-10-CM | POA: Insufficient documentation

## 2017-05-07 DIAGNOSIS — Z8673 Personal history of transient ischemic attack (TIA), and cerebral infarction without residual deficits: Secondary | ICD-10-CM | POA: Insufficient documentation

## 2017-05-07 DIAGNOSIS — Z8719 Personal history of other diseases of the digestive system: Secondary | ICD-10-CM

## 2017-05-07 DIAGNOSIS — I739 Peripheral vascular disease, unspecified: Secondary | ICD-10-CM | POA: Insufficient documentation

## 2017-05-07 DIAGNOSIS — K4030 Unilateral inguinal hernia, with obstruction, without gangrene, not specified as recurrent: Principal | ICD-10-CM | POA: Insufficient documentation

## 2017-05-07 DIAGNOSIS — I10 Essential (primary) hypertension: Secondary | ICD-10-CM | POA: Insufficient documentation

## 2017-05-07 DIAGNOSIS — F17210 Nicotine dependence, cigarettes, uncomplicated: Secondary | ICD-10-CM | POA: Insufficient documentation

## 2017-05-07 DIAGNOSIS — I700 Atherosclerosis of aorta: Secondary | ICD-10-CM | POA: Insufficient documentation

## 2017-05-07 HISTORY — PX: INGUINAL HERNIA REPAIR: SHX194

## 2017-05-07 HISTORY — PX: INSERTION OF MESH: SHX5868

## 2017-05-07 LAB — URINALYSIS, ROUTINE W REFLEX MICROSCOPIC
Bilirubin Urine: NEGATIVE
Glucose, UA: NEGATIVE mg/dL
HGB URINE DIPSTICK: NEGATIVE
Ketones, ur: NEGATIVE mg/dL
Leukocytes, UA: NEGATIVE
Nitrite: NEGATIVE
PH: 7 (ref 5.0–8.0)
Protein, ur: NEGATIVE mg/dL
SPECIFIC GRAVITY, URINE: 1.015 (ref 1.005–1.030)

## 2017-05-07 LAB — COMPREHENSIVE METABOLIC PANEL
ALT: 24 U/L (ref 17–63)
AST: 17 U/L (ref 15–41)
Albumin: 3.7 g/dL (ref 3.5–5.0)
Alkaline Phosphatase: 110 U/L (ref 38–126)
Anion gap: 9 (ref 5–15)
BILIRUBIN TOTAL: 0.7 mg/dL (ref 0.3–1.2)
BUN: 11 mg/dL (ref 6–20)
CHLORIDE: 103 mmol/L (ref 101–111)
CO2: 22 mmol/L (ref 22–32)
CREATININE: 0.81 mg/dL (ref 0.61–1.24)
Calcium: 8.3 mg/dL — ABNORMAL LOW (ref 8.9–10.3)
Glucose, Bld: 104 mg/dL — ABNORMAL HIGH (ref 65–99)
Potassium: 3.7 mmol/L (ref 3.5–5.1)
Sodium: 134 mmol/L — ABNORMAL LOW (ref 135–145)
Total Protein: 7.1 g/dL (ref 6.5–8.1)

## 2017-05-07 LAB — CBC WITH DIFFERENTIAL/PLATELET
Basophils Absolute: 0 10*3/uL (ref 0.0–0.1)
Basophils Relative: 0 %
EOS PCT: 2 %
Eosinophils Absolute: 0.2 10*3/uL (ref 0.0–0.7)
HCT: 43.5 % (ref 39.0–52.0)
Hemoglobin: 15.2 g/dL (ref 13.0–17.0)
LYMPHS ABS: 2.9 10*3/uL (ref 0.7–4.0)
LYMPHS PCT: 22 %
MCH: 32.9 pg (ref 26.0–34.0)
MCHC: 34.9 g/dL (ref 30.0–36.0)
MCV: 94.2 fL (ref 78.0–100.0)
MONO ABS: 1.1 10*3/uL — AB (ref 0.1–1.0)
Monocytes Relative: 8 %
Neutro Abs: 8.9 10*3/uL — ABNORMAL HIGH (ref 1.7–7.7)
Neutrophils Relative %: 68 %
PLATELETS: 211 10*3/uL (ref 150–400)
RBC: 4.62 MIL/uL (ref 4.22–5.81)
RDW: 13 % (ref 11.5–15.5)
WBC: 13.1 10*3/uL — ABNORMAL HIGH (ref 4.0–10.5)

## 2017-05-07 LAB — GLUCOSE, CAPILLARY
Glucose-Capillary: 164 mg/dL — ABNORMAL HIGH (ref 65–99)
Glucose-Capillary: 176 mg/dL — ABNORMAL HIGH (ref 65–99)

## 2017-05-07 LAB — MRSA PCR SCREENING: MRSA by PCR: NEGATIVE

## 2017-05-07 SURGERY — REPAIR, HERNIA, INGUINAL, BILATERAL, LAPAROSCOPIC
Anesthesia: General | Site: Abdomen | Laterality: Bilateral

## 2017-05-07 MED ORDER — 0.9 % SODIUM CHLORIDE (POUR BTL) OPTIME
TOPICAL | Status: DC | PRN
  Administered 2017-05-07: 1000 mL

## 2017-05-07 MED ORDER — METOPROLOL SUCCINATE ER 25 MG PO TB24
25.0000 mg | ORAL_TABLET | Freq: Every day | ORAL | Status: DC
  Administered 2017-05-07 – 2017-05-08 (×2): 25 mg via ORAL
  Filled 2017-05-07 (×2): qty 1

## 2017-05-07 MED ORDER — PROMETHAZINE HCL 25 MG/ML IJ SOLN: 6.2500 mg | INTRAMUSCULAR | Status: DC | PRN

## 2017-05-07 MED ORDER — SODIUM CHLORIDE 0.9 % IV BOLUS (SEPSIS)
1000.0000 mL | Freq: Once | INTRAVENOUS | Status: AC
  Administered 2017-05-07: 1000 mL via INTRAVENOUS

## 2017-05-07 MED ORDER — CEFAZOLIN SODIUM-DEXTROSE 2-4 GM/100ML-% IV SOLN
2.0000 g | INTRAVENOUS | Status: AC
  Administered 2017-05-07: 2 g via INTRAVENOUS

## 2017-05-07 MED ORDER — LIDOCAINE HCL (CARDIAC) 20 MG/ML IV SOLN
INTRAVENOUS | Status: DC | PRN
  Administered 2017-05-07: 100 mg via INTRAVENOUS

## 2017-05-07 MED ORDER — IOPAMIDOL (ISOVUE-300) INJECTION 61%
100.0000 mL | Freq: Once | INTRAVENOUS | Status: AC | PRN
  Administered 2017-05-07: 100 mL via INTRAVENOUS

## 2017-05-07 MED ORDER — ACETAMINOPHEN 500 MG PO TABS
ORAL_TABLET | ORAL | Status: AC
  Administered 2017-05-07: 1000 mg via ORAL
  Filled 2017-05-07: qty 2

## 2017-05-07 MED ORDER — MIDAZOLAM HCL 2 MG/2ML IJ SOLN
INTRAMUSCULAR | Status: AC
  Filled 2017-05-07: qty 2

## 2017-05-07 MED ORDER — ONDANSETRON HCL 4 MG/2ML IJ SOLN
4.0000 mg | Freq: Once | INTRAMUSCULAR | Status: AC
  Administered 2017-05-07: 4 mg via INTRAVENOUS
  Filled 2017-05-07: qty 2

## 2017-05-07 MED ORDER — ONDANSETRON HCL 4 MG/2ML IJ SOLN: 4.0000 mg | Freq: Four times a day (QID) | INTRAMUSCULAR | Status: DC | PRN

## 2017-05-07 MED ORDER — HYDROMORPHONE HCL 1 MG/ML IJ SOLN: 1.0000 mg | INTRAMUSCULAR | Status: DC | PRN

## 2017-05-07 MED ORDER — LISINOPRIL 10 MG PO TABS
10.0000 mg | ORAL_TABLET | Freq: Every day | ORAL | Status: DC
  Administered 2017-05-07 – 2017-05-08 (×2): 10 mg via ORAL
  Filled 2017-05-07 (×2): qty 1

## 2017-05-07 MED ORDER — HYDROCHLOROTHIAZIDE 12.5 MG PO CAPS
12.5000 mg | ORAL_CAPSULE | Freq: Every day | ORAL | Status: DC
  Administered 2017-05-07 – 2017-05-08 (×2): 12.5 mg via ORAL
  Filled 2017-05-07 (×2): qty 1

## 2017-05-07 MED ORDER — DEXTROSE-NACL 5-0.9 % IV SOLN: INTRAVENOUS | Status: DC

## 2017-05-07 MED ORDER — GABAPENTIN 600 MG PO TABS
1200.0000 mg | ORAL_TABLET | Freq: Every day | ORAL | Status: DC
  Filled 2017-05-07: qty 2

## 2017-05-07 MED ORDER — FENTANYL CITRATE (PF) 250 MCG/5ML IJ SOLN
INTRAMUSCULAR | Status: AC
  Filled 2017-05-07: qty 5

## 2017-05-07 MED ORDER — SODIUM CHLORIDE 0.9 % IV SOLN: INTRAVENOUS | Status: DC

## 2017-05-07 MED ORDER — ONDANSETRON 4 MG PO TBDP: 4.0000 mg | ORAL_TABLET | Freq: Four times a day (QID) | ORAL | Status: DC | PRN

## 2017-05-07 MED ORDER — CEFAZOLIN SODIUM-DEXTROSE 2-4 GM/100ML-% IV SOLN
INTRAVENOUS | Status: AC
  Filled 2017-05-07: qty 100

## 2017-05-07 MED ORDER — BUPIVACAINE HCL (PF) 0.25 % IJ SOLN
INTRAMUSCULAR | Status: AC
  Filled 2017-05-07: qty 30

## 2017-05-07 MED ORDER — ACETAMINOPHEN 500 MG PO TABS
1000.0000 mg | ORAL_TABLET | ORAL | Status: AC
  Administered 2017-05-07: 1000 mg via ORAL

## 2017-05-07 MED ORDER — ONDANSETRON HCL 4 MG PO TABS: 4.0000 mg | ORAL_TABLET | Freq: Four times a day (QID) | ORAL | Status: DC | PRN

## 2017-05-07 MED ORDER — METOPROLOL SUCCINATE ER 25 MG PO TB24: 25.0000 mg | ORAL_TABLET | Freq: Every day | ORAL | Status: DC

## 2017-05-07 MED ORDER — KETOROLAC TROMETHAMINE 30 MG/ML IJ SOLN
INTRAMUSCULAR | Status: AC
  Filled 2017-05-07: qty 1

## 2017-05-07 MED ORDER — PROPOFOL 10 MG/ML IV BOLUS
INTRAVENOUS | Status: DC | PRN
  Administered 2017-05-07: 200 mg via INTRAVENOUS

## 2017-05-07 MED ORDER — ROCURONIUM BROMIDE 100 MG/10ML IV SOLN
INTRAVENOUS | Status: DC | PRN
  Administered 2017-05-07: 10 mg via INTRAVENOUS
  Administered 2017-05-07: 20 mg via INTRAVENOUS
  Administered 2017-05-07: 50 mg via INTRAVENOUS
  Administered 2017-05-07: 10 mg via INTRAVENOUS
  Administered 2017-05-07: 20 mg via INTRAVENOUS

## 2017-05-07 MED ORDER — FENTANYL CITRATE (PF) 100 MCG/2ML IJ SOLN
INTRAMUSCULAR | Status: DC | PRN
  Administered 2017-05-07 (×4): 50 ug via INTRAVENOUS
  Administered 2017-05-07: 100 ug via INTRAVENOUS
  Administered 2017-05-07: 150 ug via INTRAVENOUS
  Administered 2017-05-07: 100 ug via INTRAVENOUS

## 2017-05-07 MED ORDER — GABAPENTIN 300 MG PO CAPS
300.0000 mg | ORAL_CAPSULE | ORAL | Status: AC
  Administered 2017-05-07: 300 mg via ORAL

## 2017-05-07 MED ORDER — GABAPENTIN 300 MG PO CAPS
300.0000 mg | ORAL_CAPSULE | Freq: Two times a day (BID) | ORAL | Status: DC
  Administered 2017-05-07 – 2017-05-08 (×2): 300 mg via ORAL
  Filled 2017-05-07 (×3): qty 1

## 2017-05-07 MED ORDER — GABAPENTIN 300 MG PO CAPS
ORAL_CAPSULE | ORAL | Status: AC
  Administered 2017-05-07: 300 mg via ORAL
  Filled 2017-05-07: qty 1

## 2017-05-07 MED ORDER — DEXAMETHASONE SODIUM PHOSPHATE 10 MG/ML IJ SOLN
INTRAMUSCULAR | Status: DC | PRN
  Administered 2017-05-07: 5 mg via INTRAVENOUS

## 2017-05-07 MED ORDER — MIDAZOLAM HCL 5 MG/5ML IJ SOLN
INTRAMUSCULAR | Status: DC | PRN
  Administered 2017-05-07: 2 mg via INTRAVENOUS

## 2017-05-07 MED ORDER — CELECOXIB 200 MG PO CAPS
ORAL_CAPSULE | ORAL | Status: AC
  Administered 2017-05-07: 200 mg via ORAL
  Filled 2017-05-07: qty 1

## 2017-05-07 MED ORDER — ACETAMINOPHEN 10 MG/ML IV SOLN
INTRAVENOUS | Status: AC
  Filled 2017-05-07: qty 100

## 2017-05-07 MED ORDER — HYDROMORPHONE HCL 1 MG/ML IJ SOLN: 0.2500 mg | INTRAMUSCULAR | Status: DC | PRN

## 2017-05-07 MED ORDER — ACETAMINOPHEN 10 MG/ML IV SOLN
INTRAVENOUS | Status: DC | PRN
  Administered 2017-05-07: 1000 mg via INTRAVENOUS

## 2017-05-07 MED ORDER — GLYCOPYRROLATE 0.2 MG/ML IJ SOLN
INTRAMUSCULAR | Status: DC | PRN
  Administered 2017-05-07: .6 mg via INTRAVENOUS

## 2017-05-07 MED ORDER — OXYCODONE HCL 5 MG PO TABS: 5.0000 mg | ORAL_TABLET | Freq: Once | ORAL | Status: DC | PRN

## 2017-05-07 MED ORDER — LISINOPRIL-HYDROCHLOROTHIAZIDE 10-12.5 MG PO TABS: 1.0000 | ORAL_TABLET | Freq: Every day | ORAL | Status: DC

## 2017-05-07 MED ORDER — NEOSTIGMINE METHYLSULFATE 10 MG/10ML IV SOLN
INTRAVENOUS | Status: DC | PRN
  Administered 2017-05-07: 4 mg via INTRAVENOUS

## 2017-05-07 MED ORDER — ONDANSETRON HCL 4 MG/2ML IJ SOLN: INTRAMUSCULAR | Status: DC | PRN

## 2017-05-07 MED ORDER — INSULIN ASPART 100 UNIT/ML ~~LOC~~ SOLN
0.0000 [IU] | Freq: Three times a day (TID) | SUBCUTANEOUS | Status: DC
  Administered 2017-05-07: 3 [IU] via SUBCUTANEOUS
  Administered 2017-05-08: 2 [IU] via SUBCUTANEOUS

## 2017-05-07 MED ORDER — SODIUM CHLORIDE 0.9 % IV SOLN
INTRAVENOUS | Status: DC
  Administered 2017-05-07: 125 mL/h via INTRAVENOUS

## 2017-05-07 MED ORDER — CELECOXIB 200 MG PO CAPS
200.0000 mg | ORAL_CAPSULE | ORAL | Status: AC
  Administered 2017-05-07: 200 mg via ORAL

## 2017-05-07 MED ORDER — KETOROLAC TROMETHAMINE 30 MG/ML IJ SOLN: 30.0000 mg | Freq: Four times a day (QID) | INTRAMUSCULAR | Status: DC | PRN

## 2017-05-07 MED ORDER — HYDROMORPHONE HCL 1 MG/ML IJ SOLN
1.0000 mg | Freq: Once | INTRAMUSCULAR | Status: AC
  Administered 2017-05-07: 1 mg via INTRAVENOUS
  Filled 2017-05-07: qty 1

## 2017-05-07 MED ORDER — OXYCODONE HCL 5 MG/5ML PO SOLN: 5.0000 mg | Freq: Once | ORAL | Status: DC | PRN

## 2017-05-07 MED ORDER — BUPIVACAINE HCL (PF) 0.25 % IJ SOLN
INTRAMUSCULAR | Status: DC | PRN
  Administered 2017-05-07: 10 mL

## 2017-05-07 MED ORDER — CHLORHEXIDINE GLUCONATE CLOTH 2 % EX PADS: 6.0000 | MEDICATED_PAD | Freq: Once | CUTANEOUS | Status: DC

## 2017-05-07 MED ORDER — LACTATED RINGERS IV SOLN
INTRAVENOUS | Status: DC
Start: 2017-05-07 — End: 2017-05-08
  Administered 2017-05-07 (×2): via INTRAVENOUS

## 2017-05-07 MED ORDER — AMITRIPTYLINE HCL 25 MG PO TABS
150.0000 mg | ORAL_TABLET | Freq: Every day | ORAL | Status: DC
  Administered 2017-05-07: 150 mg via ORAL
  Filled 2017-05-07: qty 6

## 2017-05-07 MED ORDER — KETOROLAC TROMETHAMINE 30 MG/ML IJ SOLN
INTRAMUSCULAR | Status: DC | PRN
  Administered 2017-05-07: 30 mg via INTRAVENOUS

## 2017-05-07 MED ORDER — PROPOFOL 10 MG/ML IV BOLUS
INTRAVENOUS | Status: AC
  Filled 2017-05-07: qty 20

## 2017-05-07 SURGICAL SUPPLY — 61 items
ADH SKN CLS APL DERMABOND .7 (GAUZE/BANDAGES/DRESSINGS) ×6
APPLIER CLIP LOGIC TI 5 (MISCELLANEOUS) IMPLANT
APR CLP MED LRG 33X5 (MISCELLANEOUS)
BENZOIN TINCTURE PRP APPL 2/3 (GAUZE/BANDAGES/DRESSINGS) IMPLANT
BLADE 10 SAFETY STRL DISP (BLADE) ×4 IMPLANT
CANISTER SUCT 3000ML PPV (MISCELLANEOUS) ×4 IMPLANT
CHLORAPREP W/TINT 26ML (MISCELLANEOUS) ×4 IMPLANT
CLEANER TIP ELECTROSURG 2X2 (MISCELLANEOUS) ×4 IMPLANT
CLOSURE WOUND 1/2 X4 (GAUZE/BANDAGES/DRESSINGS)
COVER SURGICAL LIGHT HANDLE (MISCELLANEOUS) ×4 IMPLANT
DERMABOND ADVANCED (GAUZE/BANDAGES/DRESSINGS) ×6
DERMABOND ADVANCED .7 DNX12 (GAUZE/BANDAGES/DRESSINGS) ×6 IMPLANT
DEVICE TROCAR PUNCTURE CLOSURE (ENDOMECHANICALS) ×4 IMPLANT
DISSECTOR BLUNT TIP ENDO 5MM (MISCELLANEOUS) IMPLANT
DRAIN PENROSE 1X12 LTX STRL (DRAIN) ×4 IMPLANT
DRAPE LAPAROSCOPIC ABDOMINAL (DRAPES) ×4 IMPLANT
ELECT PENCIL ROCKER SW 15FT (MISCELLANEOUS) ×4 IMPLANT
ELECT REM PT RETURN 9FT ADLT (ELECTROSURGICAL) ×4
ELECTRODE REM PT RTRN 9FT ADLT (ELECTROSURGICAL) ×2 IMPLANT
ENDOLOOP SUT PDS II  0 18 (SUTURE) ×2
ENDOLOOP SUT PDS II 0 18 (SUTURE) ×2 IMPLANT
GAUZE SPONGE 2X2 8PLY STRL LF (GAUZE/BANDAGES/DRESSINGS) ×2 IMPLANT
GLOVE BIO SURGEON STRL SZ7.5 (GLOVE) ×4 IMPLANT
GOWN STRL REUS W/ TWL LRG LVL3 (GOWN DISPOSABLE) ×4 IMPLANT
GOWN STRL REUS W/ TWL XL LVL3 (GOWN DISPOSABLE) ×2 IMPLANT
GOWN STRL REUS W/TWL LRG LVL3 (GOWN DISPOSABLE) ×8
GOWN STRL REUS W/TWL XL LVL3 (GOWN DISPOSABLE) ×4
KIT BASIN OR (CUSTOM PROCEDURE TRAY) ×4 IMPLANT
KIT ROOM TURNOVER OR (KITS) ×4 IMPLANT
MESH 3DMAX 5X7 RT XLRG (Mesh General) ×4 IMPLANT
NEEDLE HYPO 25GX1X1/2 BEV (NEEDLE) ×4 IMPLANT
NEEDLE INSUFFLATION 14GA 120MM (NEEDLE) ×4 IMPLANT
NS IRRIG 1000ML POUR BTL (IV SOLUTION) ×4 IMPLANT
PAD ARMBOARD 7.5X6 YLW CONV (MISCELLANEOUS) ×8 IMPLANT
RELOAD STAPLE HERNIA 4.0 BLUE (INSTRUMENTS) ×4 IMPLANT
RELOAD STAPLE HERNIA 4.8 BLK (STAPLE) ×4 IMPLANT
SCISSORS LAP 5X35 DISP (ENDOMECHANICALS) ×4 IMPLANT
SET IRRIG TUBING LAPAROSCOPIC (IRRIGATION / IRRIGATOR) IMPLANT
SET TROCAR LAP APPLE-HUNT 5MM (ENDOMECHANICALS) ×4 IMPLANT
SPONGE GAUZE 2X2 STER 10/PKG (GAUZE/BANDAGES/DRESSINGS) ×2
SPONGE LAP 18X18 X RAY DECT (DISPOSABLE) ×4 IMPLANT
STAPLER HERNIA 12 8.5 360D (INSTRUMENTS) ×4 IMPLANT
STRIP CLOSURE SKIN 1/2X4 (GAUZE/BANDAGES/DRESSINGS) IMPLANT
SUT MNCRL AB 4-0 PS2 18 (SUTURE) ×8 IMPLANT
SUT PROLENE 2 0 CT2 30 (SUTURE) ×4 IMPLANT
SUT PROLENE 2 0 KS (SUTURE) ×4 IMPLANT
SUT VIC AB 1 CT1 27 (SUTURE) ×3
SUT VIC AB 1 CT1 27XBRD ANBCTR (SUTURE) ×2 IMPLANT
SUT VIC AB 3-0 SH 27 (SUTURE) ×3
SUT VIC AB 3-0 SH 27XBRD (SUTURE) ×2 IMPLANT
SYR CONTROL 10ML LL (SYRINGE) ×4 IMPLANT
TOWEL OR 17X24 6PK STRL BLUE (TOWEL DISPOSABLE) ×4 IMPLANT
TOWEL OR 17X26 10 PK STRL BLUE (TOWEL DISPOSABLE) ×4 IMPLANT
TRAY FOLEY CATH SILVER 16FR (SET/KITS/TRAYS/PACK) ×4 IMPLANT
TRAY LAPAROSCOPIC MC (CUSTOM PROCEDURE TRAY) ×4 IMPLANT
TROCAR XCEL 12X100 BLDLESS (ENDOMECHANICALS) ×4 IMPLANT
TUBE CONNECTING 20'X1/4 (TUBING) ×1
TUBE CONNECTING 20X1/4 (TUBING) ×3 IMPLANT
TUBING INSUFFLATION (TUBING) ×4 IMPLANT
WATER STERILE IRR 1000ML POUR (IV SOLUTION) ×4 IMPLANT
YANKAUER SUCT BULB TIP NO VENT (SUCTIONS) ×4 IMPLANT

## 2017-05-07 NOTE — Anesthesia Postprocedure Evaluation (Signed)
Anesthesia Post Note  Patient: Aaron Kennedy  Procedure(s) Performed: LAPAROSCOPIC RIGHT  INGUINAL HERNIA REPAIR AND  OPEN LEFT   INGUINAL EXPLORATION (Bilateral Abdomen) INSERTION OF MESH (Bilateral )     Patient location during evaluation: PACU Anesthesia Type: General Level of consciousness: awake and alert Pain management: pain level controlled Vital Signs Assessment: post-procedure vital signs reviewed and stable Respiratory status: spontaneous breathing, nonlabored ventilation, respiratory function stable and patient connected to nasal cannula oxygen Cardiovascular status: blood pressure returned to baseline and stable Postop Assessment: no apparent nausea or vomiting Anesthetic complications: no    Last Vitals:  Vitals:   05/07/17 1500 05/07/17 1522  BP: 137/89 126/77  Pulse: 78 69  Resp: 16 18  Temp:  37 C  SpO2: 97% 98%    Last Pain:  Vitals:   05/07/17 1537  TempSrc:   PainSc: 0-No pain                 Ryan P Ellender

## 2017-05-07 NOTE — Anesthesia Preprocedure Evaluation (Signed)
Anesthesia Evaluation  Patient identified by MRN, date of birth, ID band Patient awake    Reviewed: Allergy & Precautions, NPO status , Patient's Chart, lab work & pertinent test results, reviewed documented beta blocker date and time   Airway Mallampati: II  TM Distance: >3 FB Neck ROM: Full    Dental  (+) Chipped,    Pulmonary Current Smoker,    Pulmonary exam normal breath sounds clear to auscultation       Cardiovascular hypertension, Pt. on medications and Pt. on home beta blockers Normal cardiovascular exam Rhythm:Regular Rate:Normal  ECG: NSR, rate 61   Neuro/Psych Seizures -, Well Controlled,  Right side sluggish per patient TIACVA, Residual Symptoms negative psych ROS   GI/Hepatic negative GI ROS, Neg liver ROS,   Endo/Other  negative endocrine ROS  Renal/GU negative Renal ROS     Musculoskeletal negative musculoskeletal ROS (+)   Abdominal (+) + obese,   Peds  Hematology HLD   Anesthesia Other Findings incarcurated left inguinal hernia  Reproductive/Obstetrics                             Anesthesia Physical Anesthesia Plan  ASA: III  Anesthesia Plan: General   Post-op Pain Management:    Induction: Intravenous  PONV Risk Score and Plan: 1 and Ondansetron, Dexamethasone, Midazolam and Treatment may vary due to age or medical condition  Airway Management Planned: Oral ETT  Additional Equipment:   Intra-op Plan:   Post-operative Plan: Extubation in OR  Informed Consent: I have reviewed the patients History and Physical, chart, labs and discussed the procedure including the risks, benefits and alternatives for the proposed anesthesia with the patient or authorized representative who has indicated his/her understanding and acceptance.   Dental advisory given  Plan Discussed with: CRNA  Anesthesia Plan Comments:         Anesthesia Quick Evaluation

## 2017-05-07 NOTE — ED Notes (Signed)
[facility] called to consult general surgery at [facility]. Spoke to [facility].

## 2017-05-07 NOTE — ED Notes (Signed)
Pt attempted to leave to go out to parking lot to find male visitor. Staff went to her car and brought her back inside. Pt then began arguing with male visitor at bedside

## 2017-05-07 NOTE — H&P (Signed)
Aaron Kennedy is an 47 y.o. male.   Chief Complaint: Incarcerated left inguinal hernia HPI: Patient is a 48 year old male with a past medical history significant for hypertension, stroke, who comes in with a history of a 2-day history of incarcerated left inguinal hernia. Patient states that he previously had a bilateral hernia repair as an infant.  He states that approximately 14 years ago he had a open left inguinal hernia with infection postoperatively.  He states that he noticed a recurrence to the right side approximately a year ago from his infant repair as well as a left inguinal hernia recurrence over the last several months.  Patient states he has had no nausea or vomiting.  Continues with normal bowel movements.  Patient was seen in outside ER secondary to pain.  He underwent CT scan which revealed incarcerated left inguinal hernia as well as a right side bowel containing inguinal hernia.  Patient was transferred to [facility] for further evaluation and management.  Past Medical History:  Diagnosis Date  . Hypertension   . Stroke [facility])     Past Surgical History:  Procedure Laterality Date  . HERNIA REPAIR    . LEG SURGERY      Family History  Problem Relation Age of Onset  . Stroke Brother        71s  . Heart attack Sister        24s   Social History:  reports that he has been smoking cigarettes.  He has a 10.00 pack-year smoking history. he has never used smokeless tobacco. He reports that he uses drugs. Drug: Marijuana. He reports that he does not drink alcohol.  Allergies: No Known Allergies   (Not in a hospital admission)  Results for orders placed or performed during the hospital encounter of 05/07/17 (from the past 48 hour(s))  CBC with Differential     Status: Abnormal   Collection Time: 05/07/17  2:45 AM  Result Value Ref Range   WBC 13.1 (H) 4.0 - 10.5 K/uL   RBC 4.62 4.22 - 5.81 MIL/uL   Hemoglobin 15.2 13.0 - 17.0 g/dL   HCT 43.5 39.0 - 52.0 %    MCV 94.2 78.0 - 100.0 fL   MCH 32.9 26.0 - 34.0 pg   MCHC 34.9 30.0 - 36.0 g/dL   RDW 13.0 11.5 - 15.5 %   Platelets 211 150 - 400 K/uL   Neutrophils Relative % 68 %   Neutro Abs 8.9 (H) 1.7 - 7.7 K/uL   Lymphocytes Relative 22 %   Lymphs Abs 2.9 0.7 - 4.0 K/uL   Monocytes Relative 8 %   Monocytes Absolute 1.1 (H) 0.1 - 1.0 K/uL   Eosinophils Relative 2 %   Eosinophils Absolute 0.2 0.0 - 0.7 K/uL   Basophils Relative 0 %   Basophils Absolute 0.0 0.0 - 0.1 K/uL  Comprehensive metabolic panel     Status: Abnormal   Collection Time: 05/07/17  2:45 AM  Result Value Ref Range   Sodium 134 (L) 135 - 145 mmol/L   Potassium 3.7 3.5 - 5.1 mmol/L   Chloride 103 101 - 111 mmol/L   CO2 22 22 - 32 mmol/L   Glucose, Bld 104 (H) 65 - 99 mg/dL   BUN 11 6 - 20 mg/dL   Creatinine, Ser 0.81 0.61 - 1.24 mg/dL   Calcium 8.3 (L) 8.9 - 10.3 mg/dL   Total Protein 7.1 6.5 - 8.1 g/dL   Albumin 3.7 3.5 - 5.0 g/dL  AST 17 15 - 41 U/L   ALT 24 17 - 63 U/L   Alkaline Phosphatase 110 38 - 126 U/L   Total Bilirubin 0.7 0.3 - 1.2 mg/dL   GFR calc non Af Amer >60 >60 mL/min   GFR calc Af Amer >60 >60 mL/min    Comment: (NOTE) The eGFR has been calculated using the CKD EPI equation. This calculation has not been validated in all clinical situations. eGFR's persistently <60 mL/min signify possible Chronic Kidney Disease.    Anion gap 9 5 - 15  Urinalysis, Routine w reflex microscopic     Status: None   Collection Time: 05/07/17  2:55 AM  Result Value Ref Range   Color, Urine YELLOW YELLOW   APPearance CLEAR CLEAR   Specific Gravity, Urine 1.015 1.005 - 1.030   pH 7.0 5.0 - 8.0   Glucose, UA NEGATIVE NEGATIVE mg/dL   Hgb urine dipstick NEGATIVE NEGATIVE   Bilirubin Urine NEGATIVE NEGATIVE   Ketones, ur NEGATIVE NEGATIVE mg/dL   Protein, ur NEGATIVE NEGATIVE mg/dL   Nitrite NEGATIVE NEGATIVE   Leukocytes, UA NEGATIVE NEGATIVE    Comment: Microscopic not done on urines with negative protein,  blood, leukocytes, nitrite, or glucose < 500 mg/dL.   Ct Abdomen Pelvis W Contrast  Result Date: 05/07/2017 CLINICAL DATA:  Left lower quadrant pain for 2 days. EXAM: CT ABDOMEN AND PELVIS WITH CONTRAST TECHNIQUE: Multidetector CT imaging of the abdomen and pelvis was performed using the standard protocol following bolus administration of intravenous contrast. CONTRAST:  156m ISOVUE-300 IOPAMIDOL (ISOVUE-300) INJECTION 61% COMPARISON:  CTA Runoff 04/07/2015 FINDINGS: Lower chest: No pulmonary nodules or pleural effusion. No visible pericardial effusion. Hepatobiliary: Normal hepatic contours and density. No visible biliary dilatation. Normal gallbladder. Pancreas: Normal contours without ductal dilatation. No peripancreatic fluid collection. Spleen: 2 calcified granulomata. Adrenals/Urinary Tract: --Adrenal glands: Normal. --Right kidney/ureter: No hydronephrosis or perinephric stranding. No nephrolithiasis. No obstructing ureteral stones. --Left kidney/ureter: No hydronephrosis or perinephric stranding. No nephrolithiasis. No obstructing ureteral stones. --Urinary bladder: Unremarkable. Stomach/Bowel: --Stomach/Duodenum: No hiatal hernia or other gastric abnormality. Normal duodenal course and caliber. --Small bowel: There are bilateral inguinal hernias. The right-sided hernia contains a loop of nonobstructed small bowel. The left hernia shows evidence of inflammation with induration of the internal an overlying fat. --Colon: No focal abnormality. --Appendix: Normal. Vascular/Lymphatic: Atherosclerotic calcification is present within the non-aneurysmal abdominal aorta, without hemodynamically significant stenosis. No abdominal or pelvic lymphadenopathy. Reproductive: Prostate calcifications Musculoskeletal. No bony spinal canal stenosis or focal osseous abnormality. Other: None. IMPRESSION: 1. Bilateral inguinal hernias. The right inguinal hernia contains a loop of nonobstructed small bowel without  inflammatory change. The left inguinal hernia does not contain bowel but shows moderate inflammation of the internal and overlying fat. This may indicate acute fat necrosis. 2.  Aortic Atherosclerosis (ICD10-I70.0). Electronically Signed   By: KUlyses JarredM.D.   On: 05/07/2017 05:26    Review of Systems  Constitutional: Negative for chills, fever and malaise/fatigue.  HENT: Negative for ear discharge, hearing loss and sore throat.   Eyes: Negative for blurred vision and discharge.  Respiratory: Negative for cough and shortness of breath.   Cardiovascular: Negative for chest pain, orthopnea and leg swelling.  Gastrointestinal: Positive for abdominal pain. Negative for constipation, diarrhea, heartburn, nausea and vomiting.  Musculoskeletal: Negative for myalgias and neck pain.  Skin: Negative for itching and rash.  Neurological: Negative for dizziness, focal weakness, seizures and loss of consciousness.  Endo/Heme/Allergies: Negative for environmental allergies. Does not bruise/bleed  easily.  Psychiatric/Behavioral: Negative for depression and suicidal ideas.  All other systems reviewed and are negative.   Blood pressure (!) 138/96, pulse 74, temperature 98.6 F (37 C), temperature source Oral, resp. rate 20, SpO2 95 %. Physical Exam  Constitutional: He is oriented to person, place, and time. Vital signs are normal. He appears well-developed and well-nourished.  Conversant No acute distress  Eyes: Lids are normal. No scleral icterus.  No lid lag Moist conjunctiva  Neck: No tracheal tenderness present. No thyromegaly present.  No cervical lymphadenopathy  Cardiovascular: Normal rate, regular rhythm and intact distal pulses.  No murmur heard. Respiratory: Effort normal and breath sounds normal. He has no wheezes. He has no rales.  GI: Soft. Bowel sounds are normal. He exhibits no mass. There is no hepatosplenomegaly. There is no tenderness. There is no rebound and no guarding. A hernia  is present. Hernia confirmed positive in the right inguinal area and confirmed positive in the left inguinal area (incarcerated).  Neurological: He is alert and oriented to person, place, and time.  Normal gait and station  Skin: Skin is warm. No rash noted. No cyanosis. Nails show no clubbing.  Normal skin turgor  Psychiatric: Judgment normal.  Appropriate affect     Assessment/Plan 47 year old male with a recurrent bilateral inguinal hernias, left incarcerated inguinal hernia recurrence Stroke HTN  1.  We will admit the patient, keep n.p.o. 2.  We will proceed to the operating for laparoscopic versus open left inguinal hernia, possible right inguinal hernia repair with mesh. 3.  All risks and benefits were discussed with the patient, to generally include infection, bleeding, damage to surrounding structures, acute and chronic nerve pain, and recurrence. Alternatives were offered and described.  All questions were answered and the patient voiced understanding of the procedure and wishes to proceed at this point.   Reyes Ivan, MD 05/07/2017, 8:12 AM

## 2017-05-07 NOTE — ED Provider Notes (Addendum)
TIME SEEN: 2:29 AM  CHIEF COMPLAINT: Left inguinal hernia pain  HPI: Patient is a 47 year old male with history of previous stroke in 2005 on Plavix who presents to the emergency department with left inguinal hernia pain.  He states that he had previous open hernia repair on this side by Dr. Maryagnes Amos in 2005 and had mesh placed.  States he has noticed over the past day and a half that he has not been able to reduce the left inguinal hernia and that it is "hard as a rock" and very painful.  No fever.  Has had nausea but no vomiting.  Having normal bowel movements.  Able to eat and drink.  Also has a right inguinal hernia but states this has been easily reducible.  He has never had problems reducing his hernia as before.  States he was out of his Plavix for the past 30 days but once he got a refill he took his first dose yesterday morning.  He has been n.p.o. since 6 PM last night.  ROS: See HPI Constitutional: no fever  Eyes: no drainage  ENT: no runny nose   Cardiovascular:  no chest pain  Resp: no SOB  GI: no vomiting GU: no dysuria Integumentary: no rash  Allergy: no hives  Musculoskeletal: no leg swelling  Neurological: no slurred speech ROS otherwise negative  PAST MEDICAL HISTORY/PAST SURGICAL HISTORY:  Past Medical History:  Diagnosis Date  . Stroke [facility])     MEDICATIONS:  Prior to Admission medications   Medication Sig Start Date End Date Taking? Authorizing Provider  amitriptyline (ELAVIL) 50 MG tablet Take 3 tablets (150 mg total) by mouth at bedtime. 02/11/17   Massie Maroon, FNP  amitriptyline (ELAVIL) 50 MG tablet TAKE THREE TABLETS BY MOUTH AT BEDTIME. 04/28/17   Massie Maroon, FNP  atorvastatin (LIPITOR) 80 MG tablet TAKE 1 TABLET BY MOUTH DAILY AT 6 PM. 12/30/16   Bing Neighbors, FNP  clopidogrel (PLAVIX) 75 MG tablet Take 1 tablet (75 mg total) by mouth daily. 02/11/17   Massie Maroon, FNP  clopidogrel (PLAVIX) 75 MG tablet TAKE 1 TABLET BY MOUTH DAILY.  03/31/17   Massie Maroon, FNP  gabapentin (NEURONTIN) 600 MG tablet TAKE 2 TABLETS BY MOUTH DAILY. 04/28/17   Massie Maroon, FNP  lisinopril-hydrochlorothiazide (PRINZIDE,ZESTORETIC) 10-12.5 MG tablet Take 1 tablet by mouth daily. 02/11/17   Massie Maroon, FNP  metFORMIN (GLUCOPHAGE) 500 MG tablet Take 1 tablet (500 mg total) by mouth 2 (two) times daily with a meal. 12/30/16   Bing Neighbors, FNP  metoprolol succinate (TOPROL-XL) 25 MG 24 hr tablet TAKE 1 TABLET BY MOUTH DAILY. 02/11/17   Massie Maroon, FNP  mupirocin ointment (BACTROBAN) 2 % Apply 1 application topically 3 (three) times daily. 12/24/16   Bing Neighbors, FNP    ALLERGIES:  No Known Allergies  SOCIAL HISTORY:  Social History   Tobacco Use  . Smoking status: Current Every Day Smoker    Packs/day: 0.50    Years: 20.00    Pack years: 10.00    Types: Cigarettes  . Smokeless tobacco: Never Used  Substance Use Topics  . Alcohol use: No    FAMILY HISTORY: Family History  Problem Relation Age of Onset  . Stroke Brother        18s  . Heart attack Sister        69s    EXAM: BP (!) 138/99 (BP Location: Right Arm)   Pulse 97  Temp 98 F (36.7 C) (Oral)   Resp 18   SpO2 100%  CONSTITUTIONAL: Alert and oriented and responds appropriately to questions. Well-appearing; well-nourished HEAD: Normocephalic EYES: Conjunctivae clear, pupils appear equal, EOMI ENT: normal nose; moist mucous membranes NECK: Supple, no meningismus, no nuchal rigidity, no LAD  CARD: RRR; S1 and S2 appreciated; no murmurs, no clicks, no rubs, no gallops RESP: Normal chest excursion without splinting or tachypnea; breath sounds clear and equal bilaterally; no wheezes, no rhonchi, no rales, no hypoxia or respiratory distress, speaking full sentences ABD/GI: Normal bowel sounds; non-distended; soft, non-tender, no rebound, no guarding, no peritoneal signs, no hepatosplenomegaly; patient has bilateral inguinal hernias.  Hernia  on the right side is easily reducible and nontender to palpation.  Hernia on the left side is very tender to palpation and I am unable to reduce despite pain medication and Trendelenburg.  There are no skin changes over this area.  No redness, warmth, ecchymosis or necrosis. GU:  Normal external genitalia, circumcised male, normal penile shaft, no blood or discharge at the urethral meatus, no testicular masses or tenderness on exam, no scrotal masses or swelling, 2+ femoral pulses bilaterally; no perineal erythema, warmth, subcutaneous air or crepitus; no high riding testicle, normal bilateral cremasteric reflex.  Chaperone present for exam. BACK:  The back appears normal and is non-tender to palpation, there is no CVA tenderness EXT: Normal ROM in all joints; non-tender to palpation; no edema; normal capillary refill; no cyanosis, no calf tenderness or swelling    SKIN: Normal color for age and race; warm; no rash NEURO: Moves all extremities equally PSYCH: The patient's mood and manner are appropriate. Grooming and personal hygiene are appropriate.  MEDICAL DECISION MAKING: Patient here with what I feel is an incarcerated hernia.  Difficult to tell if there is bowel in this hernia or not.  The right hernia is easily reducible left hernia is hard and very painful.  Unable to reduce despite pain medication and Trendelenburg positioning.  Will obtain labs, urine.  Will discuss with surgery.  We will have him begin drinking oral contrast in case general surgery wants a CT scan.  ED PROGRESS:   3:25 AM  D/w Dr. Cliffton Asters with general surgery at [facility].  Dr. Michaell Cowing unable to accept further patient's given large volume at [facility].  I appreciate Dr. Lucilla Lame help.  He agrees with CT scan for further evaluation to determine if there is bowel or only fat present.  He he agrees that patient will need surgical intervention.   5:15 AM  CT scan she has bilateral inguinal hernia.  Right hernia contains a loop of  nonobstructed bowel without inflammatory changes.  Again this is the side that is easily reducible.  Left inguinal hernia does not contain bowel but does show moderate inflammation of the internal and overlying fat they may indicate necrosis.  Given no bowel necrosis I do not feel he needs to be started on antibiotics at this time.  Again no overlying skin changes.  Will continue Dilaudid for pain control.  We will keep him n.p.o. and continue IV fluids.  Discussed again with Dr. Cliffton Asters who agrees with transfer to the [facility] emergency department where he will be seen by surgery for admission.  Patient comfortable with this plan and has been updated.   5:30 AM  D/w Dr. Eudelia Bunch, EDP at [facility] who agrees to accept patient in transport. [facility] notified.    6:00 AM  [facility] at bedside for  transport.  Patient hemodynamically stable.  Pain currently well controlled.   I reviewed all nursing notes, vitals, pertinent previous records, EKGs, lab and urine results, imaging (as available).     Ward, Layla Maw, DO 05/07/17 [phone]

## 2017-05-07 NOTE — Anesthesia Procedure Notes (Signed)
Procedure Name: Intubation Date/Time: 05/07/2017 10:44 AM Performed by: White, Amedeo Plenty, CRNA Pre-anesthesia Checklist: Patient identified, Emergency Drugs available, Suction available and Patient being monitored Patient Re-evaluated:Patient Re-evaluated prior to induction Oxygen Delivery Method: Circle System Utilized Preoxygenation: Pre-oxygenation with 100% oxygen Induction Type: IV induction Ventilation: Oral airway inserted - appropriate to patient size and Mask ventilation without difficulty Laryngoscope Size: Mac and 4 Grade View: Grade I Tube type: Oral Tube size: 7.5 mm Number of attempts: 1 Airway Equipment and Method: Stylet and Oral airway Placement Confirmation: ETT inserted through vocal cords under direct vision,  positive ETCO2 and breath sounds checked- equal and bilateral Secured at: 23 cm Tube secured with: Tape Dental Injury: Teeth and Oropharynx as per pre-operative assessment

## 2017-05-07 NOTE — Transfer of Care (Signed)
Immediate Anesthesia Transfer of Care Note  Patient: Aaron Kennedy  Procedure(s) Performed: LAPAROSCOPIC RIGHT  INGUINAL HERNIA REPAIR AND  OPEN LEFT   INGUINAL EXPLORATION (Bilateral Abdomen) INSERTION OF MESH (Bilateral )  Patient Location: PACU  Anesthesia Type:General  Level of Consciousness: awake, alert  and patient cooperative  Airway & Oxygen Therapy: Patient Spontanous Breathing  Post-op Assessment: Report given to RN and Post -op Vital signs reviewed and stable  Post vital signs: Reviewed and stable  Last Vitals:  Vitals:   05/07/17 0642 05/07/17 1400  BP: (!) 138/96 (!) 153/93  Pulse: 74 98  Resp: 20 16  Temp: 37 C   SpO2: 95% 98%    Last Pain:  Vitals:   05/07/17 1400  TempSrc:   PainSc: (P) Asleep         Complications: No apparent anesthesia complications

## 2017-05-07 NOTE — ED Provider Notes (Signed)
Transfer from med [employer].  Patient resting comfortably.  Vital signs stable.  General surgery paged.   Loren Racer, MD 05/08/17 [phone]

## 2017-05-07 NOTE — Op Note (Signed)
05/07/2017  1:44 PM  PATIENT:  Aaron Kennedy  47 y.o. male  PRE-OPERATIVE DIAGNOSIS: recurrent, incarcerated left inguinal hernia, recurrent right inguinal hernia  POST-OPERATIVE DIAGNOSIS: recurrent, incarcerated left inguinal hernia, recurrent right inguinal hernia   PROCEDURE:  Procedure(s): LAPAROSCOPIC RIGHT   INGUINAL HERNIA REPAIR AND  OPEN LEFT  INGUINAL EXPLORATION INSERTION OF MESH (RIGHT )  SURGEON:  Surgeon(s) and Role:    * Axel Filler, MD - Primary  PHYSICIAN ASSISTANT:   ASSISTANTS: none   ANESTHESIA:   local and general  EBL:  50 mL   BLOOD ADMINISTERED:none  DRAINS: none   LOCAL MEDICATIONS USED:  BUPIVICAINE   SPECIMEN:  No Specimen  DISPOSITION OF SPECIMEN:  N/A  COUNTS:  YES  TOURNIQUET:  * No tourniquets in log *  DICTATION: .Dragon Dictation  Counts: reported as correct x 2  Findings:  The patient had a large right direct hernia  Indications for procedure:  The patient is a 47 year old male with a recurrent, incarcerated left inguinal hernia and recurrent right hernia for several months. Patient complained of symptomatology to his left inguinal area. The patient was taken back for elective inguinal hernia repair.  Details of the procedure: The patient was taken back to the operating room. The patient was placed in supine position with bilateral SCDs in place.  The patient underwent GETA.  A foley catheter was placed. The patient was prepped and draped in the usual sterile fashion.  After appropriate anitbiotics were confirmed, a time-out was confirmed and all facts were verified.  0.25% Marcaine was used to infiltrate the umbilical area. A 11-blade was used to cut down the skin and blunt dissection was used to get the anterior fashion.  The anterior fascia was incised approximately 1 cm and the muscles were retracted laterally. Blunt dissection was then used to create a space in the preperitoneal area. At this time a 10 mm camera was then  introduced into the space and advanced the pubic tubercle and a 12 mm trocar was placed over this and insufflation was started.  At this time and space was created from medial to laterally the preperitoneal space on the left side.  Cooper's ligament was initially cleaned off.  Continue with dissection laterally.  The area of the previous hernia site could be visualized.  The peritoneum was scarred up to the left spermatic cord.  There is a scar up to the left internal inguinal ring.  This was dissected back.  There was a small tear into the left peritoneum.  This was ligated using a 0 PDS Endoloop x1.  Was able to visualize a previously placed hernia plug.  This was debrided somewhat.  Initially thought that there was some herniation through this area however this area appeared to be solid.  There was no hernia in the direct space, or the indirect space.  The femoral canal appeared to be free of any hernia.  Even with dissection the incarcerated portion of the hernia the patient was able to palpate could continue to be palpated here.  At this time we will proceed to evaluate the left tendon repair.    The exact same dissection took place on the opposite side. The spermatic cord was identified.  The hernia sac was identified and dissected away from the cremesteric muscle fibers.  The hernia was seen in the direct space.  The peritoneum was dissected away from transversalis fascia.  This spontaneously retracted.  Appear to contain bladder potentially.  There is no  injury to the area.  I then proceeded to dissect the spermatic cord.  The vas deferens was identified and protected portion of the case.  Patient was created laterally for the mesh.  At this time a piece of Bard 3 DMax extra-large right-sided mesh was placed in the prepectoral space.  This covered both the direct, indirect as well as a femoral spaces.  Dissected the peritoneum line posterior to the hernia mesh.    The insufflation was evacuated and  the peritoneum was seen posterior to the mesh bilaterally. The trochars were removed. The anterior fascia was reapproximated using #1 Vicryl on a UR- 6.  Intra-abdominal air was evacuated and the Veress needle removed. The skin was reapproximated using 4-0 Monocryl subcuticular fashion.  At this time the main incision to the left inguinal area.  This is approximately 1 cm superior to the ilioinguinal ligament.  This was approximately 40 minutes in length.  Cautery was used to maintain hemostasis and dissection was taken down through Scarpa's fascia.  There is a large amount of scar tissue deep to Scarpa's fascia.  The left spermatic cord be visualized.  This was circumferentially dissected away.  The external or internal oblique could not be visualized secondary to the dense scar tissue.  I was able to visualize the spermatic cord and circumferentially dissected away from surrounding tissue.  Again there was no hernia sac that could be visualized as expected to be at the preperitoneal dissection.  There was a amount of scar tissue that could easily be dissected away just lateral to the spermatic cord.  This was dissected away and consisted mainly of scar tissue.  This appeared to be the area of the bulge that the patient could feel externally.  At this time scar tissue was reapproximated to recreate the external inguinal ring.  This was done using a 2-0 Prolene in an interrupted fashion x3.  Scarpa's fascia was reapproximated using 3-0 Vicryl in a running fashion.  Skin was reapproximated using 4-0 Monocryl subcuticular fashion.  The skin was then dressed with Dermabond.  Foley catheter was removed.  Patient was awake from general anesthesia and taken to recovery in stable condition.   PLAN OF CARE: Admit for overnight observation  PATIENT DISPOSITION:  PACU - hemodynamically stable.   Delay start of Pharmacological VTE agent (>24hrs) due to surgical blood loss or risk of bleeding: not applicable

## 2017-05-07 NOTE — ED Provider Notes (Signed)
Contacted by Dr. Elesa Massed at [facility] for this patient who is being transferred from ED to ED for a right incarcerated inguinal hernia.  Dr. Elesa Massed spoke with Dr. Carolynne Edouard at [facility] who requested transfer to [facility].  This was discussed with Dr. Cliffton Asters from general surgery here at [facility] who agreed to manage the patient.  Plan is to contact general surgery upon arrival for further evaluation and management.   Nira Conn, MD 05/07/17 [phone]

## 2017-05-07 NOTE — ED Triage Notes (Signed)
Pt presents with c/o left groin pain . Pt states he had hernia surgery ion 2005 and has been having pain for 2 days now in same area.

## 2017-05-08 ENCOUNTER — Encounter (HOSPITAL_COMMUNITY): Payer: Self-pay

## 2017-05-08 LAB — GLUCOSE, CAPILLARY: Glucose-Capillary: 127 mg/dL — ABNORMAL HIGH (ref 65–99)

## 2017-05-08 MED ORDER — OXYCODONE HCL 5 MG PO TABS: 5.0000 mg | ORAL_TABLET | ORAL | 0 refills | Status: DC | PRN

## 2017-05-08 NOTE — Discharge Instructions (Signed)
[facility] _______[facility] ° °UMBILICAL OR INGUINAL HERNIA REPAIR: POST OP INSTRUCTIONS ° °Always review your discharge instruction sheet given to you by the facility where your surgery was performed. °IF YOU HAVE DISABILITY OR FAMILY LEAVE FORMS, YOU MUST BRING THEM TO THE OFFICE FOR PROCESSING.   °DO NOT GIVE THEM TO YOUR DOCTOR. ° °1. A  prescription for pain medication may be given to you upon discharge.  Take your pain medication as prescribed, if needed.  If narcotic pain medicine is not needed, then you may take acetaminophen (Tylenol) or ibuprofen (Advil) as needed. °2. Take your usually prescribed medications unless otherwise directed. °If you need a refill on your pain medication, please contact your pharmacy.  They will contact our office to request authorization. Prescriptions will not be filled after 5 pm or on week-ends. °3. You should follow a light diet the first 24 hours after arrival home, such as soup and crackers, etc.  Be sure to include lots of fluids daily.  Resume your normal diet the day after surgery. °4.Most patients will experience some swelling and bruising around the umbilicus or in the groin and scrotum.  Ice packs and reclining will help.  Swelling and bruising can take several days to resolve.  °6. It is common to experience some constipation if taking pain medication after surgery.  Increasing fluid intake and taking a stool softener (such as Colace) will usually help or prevent this problem from occurring.  A mild laxative (Milk of Magnesia or Miralax) should be taken according to package directions if there are no bowel movements after 48 hours. °7. Unless discharge instructions indicate otherwise, you may remove your bandages 24-48 hours after surgery, and you may shower at that time.  You may have steri-strips (small skin tapes) in place directly over the incision.  These strips should be left on the skin for 7-10 days.  If your surgeon used skin glue on the  incision, you may shower in 24 hours.  The glue will flake off over the next 2-3 weeks.  Any sutures or staples will be removed at the office during your follow-up visit. °8. ACTIVITIES:  You may resume regular (light) daily activities beginning the next day--such as daily self-care, walking, climbing stairs--gradually increasing activities as tolerated.  You may have sexual intercourse when it is comfortable.  Refrain from any heavy lifting or straining until approved by your doctor. ° °a.You may drive when you are no longer taking prescription pain medication, you can comfortably wear a seatbelt, and you can safely maneuver your car and apply brakes. ° °9.You should see your doctor in the office for a follow-up appointment approximately 2-3 weeks after your surgery.  Make sure that you call for this appointment within a day or two after you arrive home to insure a convenient appointment time. ° ° °WHEN TO CALL YOUR DOCTOR: °1. Fever over 101.0 °2. Inability to urinate °3. Nausea and/or vomiting °4. Extreme swelling or bruising °5. Continued bleeding from incision. °6. Increased pain, redness, or drainage from the incision ° °The clinic staff is available to answer your questions during regular business hours.  Please don’t hesitate to call and ask to speak to one of the nurses for clinical concerns.  If you have a medical emergency, go to the nearest emergency room or call 911.  A surgeon from [facility] is always on call at the hospital ° ° °[address], [address], [place], [place]  [address] ? °   [address] [address], [place], [place]   [address] °[phone] ? [phone] ? FAX [phone] °Web site: [email or web address] °

## 2017-05-08 NOTE — Discharge Summary (Signed)
[facility] Discharge Summary   Patient ID: Aaron Kennedy MRN: [id number] DOB/AGE: 47-31-1972 47 y.o.  Admit date: 05/07/2017 Discharge date: 05/08/2017  Admitting Diagnosis: Recurrent inguinal hernia  Discharge Diagnosis Patient Active Problem List   Diagnosis Date Noted  . Recurrent inguinal hernia 05/07/2017  . S/P hernia repair 05/07/2017  . Chronic pain of right knee 04/22/2016  . PAD (peripheral artery disease) (HCC) 05/02/2015  . Stroke (cerebrum) (HCC) 04/08/2015  . 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 CVA (cerebral vascular accident) (HCC) 04/03/2015  . Seizures (HCC) 04/03/2015  . Stroke (HCC) 03/22/2015  . HTN (hypertension) 03/22/2015  . HLD (hyperlipidemia) 03/22/2015  . Prediabetes 03/22/2015  . Tobacco use disorder 03/22/2015  . [name] (transient ischemic attack) 03/18/2015  . History of fracture of ankle   . Right ankle pain     Consultants none Imaging: Ct Abdomen Pelvis W Contrast  Result Date: 05/07/2017 CLINICAL DATA:  Left lower quadrant pain for 2 days. EXAM: CT ABDOMEN AND PELVIS WITH CONTRAST TECHNIQUE: Multidetector CT imaging of the abdomen and pelvis was performed using the standard protocol following bolus administration of intravenous contrast. CONTRAST:  ISOVUE-300 IOPAMIDOL (ISOVUE-300) INJECTION 61% COMPARISON:  CTA Runoff 04/07/2015 FINDINGS: Lower chest: No pulmonary nodules or pleural effusion. No visible pericardial effusion. Hepatobiliary: Normal hepatic contours and density. No visible biliary dilatation. Normal gallbladder. Pancreas: Normal contours without ductal dilatation. No peripancreatic fluid collection. Spleen: 2 calcified granulomata. Adrenals/Urinary Tract: --Adrenal glands: Normal. --Right kidney/ureter: No hydronephrosis or perinephric stranding. No nephrolithiasis. No obstructing ureteral stones. --Left kidney/ureter: No hydronephrosis or perinephric stranding. No nephrolithiasis.  No obstructing ureteral stones. --Urinary bladder: Unremarkable. Stomach/Bowel: --Stomach/Duodenum: No hiatal hernia or other gastric abnormality. Normal duodenal course and caliber. --Small bowel: There are bilateral inguinal hernias. The right-sided hernia contains a loop of nonobstructed small bowel. The left hernia shows evidence of inflammation with induration of the internal an overlying fat. --Colon: No focal abnormality. --Appendix: Normal. Vascular/Lymphatic: Atherosclerotic calcification is present within the non-aneurysmal abdominal aorta, without hemodynamically significant stenosis. No abdominal or pelvic lymphadenopathy. Reproductive: Prostate calcifications Musculoskeletal. No bony spinal canal stenosis or focal osseous abnormality. Other: None. IMPRESSION: 1. Bilateral inguinal hernias. The right inguinal hernia contains a loop of nonobstructed small bowel without inflammatory change. The left inguinal hernia does not contain bowel but shows moderate inflammation of the internal and overlying fat. This may indicate acute fat necrosis. 2.  Aortic Atherosclerosis (ICD10-I70.0). Electronically Signed   By: Deatra Robinson M.D.   On: 05/07/2017 05:26    Procedures Dr. Derrell Lolling (05/07/17) - laparoscopic right inguinal hernia repair Dr. Derrell Lolling (05/07/17) - open left inguinal exploration   Hospital Course:  Patient is a 47 y/o male who presented to ED with groin pain and known inguinal hernias.  Workup showed bilateral inguinal hernias.  Patient was transferred to [facility] and admitted and underwent procedure listed above.  Tolerated procedure well and was transferred to the floor.  Diet was advanced as tolerated.  On POD#1, the patient was voiding well, tolerating diet, ambulating well, pain well controlled, vital signs stable, incisions c/d/i and felt stable for discharge home.  Patient will follow up in our office in 2 weeks and knows to call with questions or concerns.  He will call to confirm  appointment date/time.    Physical Exam: General:  Alert, NAD, pleasant, comfortable Abd:  Soft, ND, mild tenderness, incisions C/D/I    I have personally looked this patient up in the [place] Controlled Substance  Database and reviewed their medications.  Allergies as of 05/08/2017   No Known Allergies     Medication List    TAKE these medications   amitriptyline 50 MG tablet Commonly known as:  ELAVIL Take 3 tablets (150 mg total) by mouth at bedtime.   atorvastatin 80 MG tablet Commonly known as:  LIPITOR TAKE 1 TABLET BY MOUTH DAILY AT 6 PM.   clopidogrel 75 MG tablet Commonly known as:  PLAVIX Take 1 tablet (75 mg total) by mouth daily.   gabapentin 600 MG tablet Commonly known as:  NEURONTIN TAKE 2 TABLETS BY MOUTH DAILY.   lisinopril-hydrochlorothiazide 10-12.5 MG tablet Commonly known as:  PRINZIDE,ZESTORETIC Take 1 tablet by mouth daily.   metFORMIN 500 MG tablet Commonly known as:  GLUCOPHAGE Take 1 tablet (500 mg total) by mouth 2 (two) times daily with a meal.   metoprolol succinate 25 MG 24 hr tablet Commonly known as:  TOPROL-XL TAKE 1 TABLET BY MOUTH DAILY.   mupirocin ointment 2 % Commonly known as:  BACTROBAN Apply 1 application topically 3 (three) times daily.   oxyCODONE 5 MG immediate release tablet Commonly known as:  Oxy IR/ROXICODONE Take 1 tablet (5 mg total) by mouth every 4 (four) hours as needed for severe pain.        Follow-up Information    Axel Filler, MD. Call.   Specialty:  General Surgery Why:  Call and schedule a follow up appointment to be seen in 2 weeks.  Contact information: [address] [place] [address] [phone]           Signed: Wells Guiles, [facility] 05/08/2017, 9:04 AM Pager: [phone] Consults: [phone] Mon-Fri 7:00 am-4:30 pm Sat-Sun 7:00 am-11:30 am

## 2017-05-08 NOTE — Progress Notes (Signed)
Aaron Kennedy discharged per MD order. Discussed with the patient and all questions fully answered.  VSS, Skin clean, dry and intact without evidence of skin break down, no evidence of skin tears noted.  IV catheter discontinued intact. Site without signs and symptoms of complications. Dressing and pressure applied.  An After Visit Summary was printed and given to the patient. Patient informed where to pick up prescription.  Discharge education completed with patient including follow up instructions, medication list, d/c activities limitations if indicated, with other d/c instructions as indicated by MD - patient able to verbalize understanding, all questions fully answered.   Patient instructed to return to ED, call 911, or call MD for any changes in condition.   Patient discharged home via private auto.

## 2017-05-09 ENCOUNTER — Encounter (HOSPITAL_COMMUNITY): Payer: Self-pay | Admitting: General Surgery

## 2017-05-13 ENCOUNTER — Ambulatory Visit: Payer: Self-pay | Admitting: Family Medicine

## 2017-05-19 ENCOUNTER — Encounter: Payer: Self-pay | Admitting: Family Medicine

## 2017-05-19 ENCOUNTER — Ambulatory Visit: Payer: Self-pay | Admitting: Family Medicine

## 2017-05-19 ENCOUNTER — Ambulatory Visit (INDEPENDENT_AMBULATORY_CARE_PROVIDER_SITE_OTHER): Payer: No Typology Code available for payment source | Admitting: Family Medicine

## 2017-05-19 VITALS — BP 122/83 | HR 105 | Temp 98.1°F | Resp 16 | Ht 72.0 in | Wt 226.0 lb

## 2017-05-19 DIAGNOSIS — F17200 Nicotine dependence, unspecified, uncomplicated: Secondary | ICD-10-CM

## 2017-05-19 DIAGNOSIS — Z8719 Personal history of other diseases of the digestive system: Secondary | ICD-10-CM

## 2017-05-19 DIAGNOSIS — Z9889 Other specified postprocedural states: Secondary | ICD-10-CM

## 2017-05-19 MED ORDER — KETOROLAC TROMETHAMINE 60 MG/2ML IM SOLN
60.0000 mg | Freq: Once | INTRAMUSCULAR | Status: AC
  Administered 2017-05-19: 60 mg via INTRAMUSCULAR

## 2017-05-19 NOTE — Patient Instructions (Signed)
Patient is status post hernia repair.  Received Toradol 60 mg IM x1 for pain.  Patient advised to contact surgeon for further treatment of pain associated with hernia repair.  Patient to schedule follow-up for chronic conditions.

## 2017-05-24 NOTE — Progress Notes (Signed)
Subjective:    Patient ID: Aaron Kennedy, male    DOB: May 08, 1970, 47 y.o.   MRN: [id number]  HPI  Aaron Kennedy, a 47 year old male with a history of hypertension, CVA, and prediabetes presents for post hospital follow-up.  Patient was found to have an incarcerated left inguinal hernia and was admitted to inpatient services on 05/07/2017.  Prior to hospital admission patient had a 2-day history of left-sided abdominal pain.  He states that he previously had bilateral hernia repair as an infant and 14 years ago had an open left inguinal hernia.  Patient reports a postoperative infection at that time.  Today, patient is concerned of a postoperative infection.  He complains of pain to surgical site.  He currently rates pain as 4-5/10 characterized as intermittent and sharp.  Patient denies drainage to area.  Aaron Kennedy has not schedule follow-up with general surgery as advised at discharge.  Past Medical History:  Diagnosis Date  . Hypertension   . Stroke [facility])    Social History   Socioeconomic History  . Marital status: Single    Spouse name: Not on file  . Number of children: Not on file  . Years of education: Not on file  . Highest education level: Not on file  Social Needs  . Financial resource strain: Not on file  . Food insecurity - worry: Not on file  . Food insecurity - inability: Not on file  . Transportation needs - medical: Not on file  . Transportation needs - non-medical: Not on file  Occupational History  . Not on file  Tobacco Use  . Smoking status: Current Every Day Smoker    Packs/day: 0.50    Years: 20.00    Pack years: 10.00    Types: Cigarettes  . Smokeless tobacco: Never Used  Substance and Sexual Activity  . Alcohol use: No  . Drug use: Yes    Types: Marijuana  . Sexual activity: Not on file  Other Topics Concern  . Not on file  Social History Narrative  . Not on file   Immunization History  Administered Date(s) Administered  .  Influenza,inj,Quad PF,6+ Mos 12/29/2015  . Pneumococcal Polysaccharide-23 09/26/2015  . Tdap 12/29/2015    Review of Systems  Constitutional: Negative.   HENT: Negative.   Eyes: Negative.   Respiratory: Negative.   Cardiovascular: Negative.   Genitourinary:       Pain to left inguinal, postsurgical site  Musculoskeletal: Negative.   Allergic/Immunologic: Negative.   Neurological: Negative.   Hematological: Negative.   Psychiatric/Behavioral: Negative.        Objective:   Physical Exam  Cardiovascular: Normal rate, normal heart sounds and intact distal pulses.  Pulmonary/Chest: Effort normal and breath sounds normal.  Abdominal: Soft.  Skin: Skin is warm.     Left postsurgical site, approximated, non-erythematous, tender to palpation, no signs of infection, no drainage         BP 122/83 (BP Location: Left Arm, Patient Position: Sitting, Cuff Size: Large)   Pulse (!) 105   Temp 98.1 F (36.7 C) (Oral)   Resp 16   Ht 6' (1.829 m)   Wt 226 lb (102.5 kg)   SpO2 97%   BMI 30.65 kg/m  Assessment & Plan:  S/P hernia repair Patient here for postsurgical follow-up.  He underwent surgery for an incarcerated hernia on 05/07/2017.  Patient has not scheduled follow-up with general surgery, advised to schedule follow-up as soon as possible.  Patient has no signs  of infection.  Postsurgical site is tender to palpation, which is normal less than 2 weeks postop.  Patient is requesting a refill on opiate pain medications that were prescribed following surgery.  Patient will need to discuss further pain management with surgeon, I will not prescribe opiate medications on today.  Patient received a Toradol injection IM without complication. - ketorolac (TORADOL) injection 60 mg   Tobacco use disorder Smoking cessation instruction/counseling given:  counseled patient on the dangers of tobacco use, advised patient to stop smoking, and reviewed strategies to maximize success    RTC:  Follow-up as previously scheduled for chronic conditions   Aaron Nations  MSN, FNP-C [facility] [address] [place], [place] [address] [phone]

## 2017-06-03 ENCOUNTER — Other Ambulatory Visit: Payer: Self-pay

## 2017-06-03 ENCOUNTER — Encounter (HOSPITAL_COMMUNITY): Payer: Self-pay | Admitting: *Deleted

## 2017-06-03 ENCOUNTER — Emergency Department (HOSPITAL_COMMUNITY): Payer: Self-pay

## 2017-06-03 ENCOUNTER — Observation Stay (HOSPITAL_COMMUNITY)
Admission: EM | Admit: 2017-06-03 | Discharge: 2017-06-04 | Disposition: A | Discharge status: Home / Self Care | Payer: Self-pay | Attending: General Surgery | Admitting: General Surgery

## 2017-06-03 DIAGNOSIS — Z79899 Other long term (current) drug therapy: Secondary | ICD-10-CM | POA: Insufficient documentation

## 2017-06-03 DIAGNOSIS — F17210 Nicotine dependence, cigarettes, uncomplicated: Secondary | ICD-10-CM | POA: Insufficient documentation

## 2017-06-03 DIAGNOSIS — I10 Essential (primary) hypertension: Secondary | ICD-10-CM | POA: Insufficient documentation

## 2017-06-03 DIAGNOSIS — E785 Hyperlipidemia, unspecified: Secondary | ICD-10-CM | POA: Insufficient documentation

## 2017-06-03 DIAGNOSIS — T8140XA Infection following a procedure, unspecified, initial encounter: Secondary | ICD-10-CM

## 2017-06-03 DIAGNOSIS — Z7984 Long term (current) use of oral hypoglycemic drugs: Secondary | ICD-10-CM | POA: Insufficient documentation

## 2017-06-03 DIAGNOSIS — Z7902 Long term (current) use of antithrombotics/antiplatelets: Secondary | ICD-10-CM | POA: Insufficient documentation

## 2017-06-03 DIAGNOSIS — Z8673 Personal history of transient ischemic attack (TIA), and cerebral infarction without residual deficits: Secondary | ICD-10-CM | POA: Insufficient documentation

## 2017-06-03 DIAGNOSIS — L0291 Cutaneous abscess, unspecified: Principal | ICD-10-CM | POA: Insufficient documentation

## 2017-06-03 LAB — CBC WITH DIFFERENTIAL/PLATELET
Basophils Absolute: 0 10*3/uL (ref 0.0–0.1)
Basophils Relative: 0 %
EOS ABS: 0.1 10*3/uL (ref 0.0–0.7)
EOS PCT: 1 %
HCT: 41.3 % (ref 39.0–52.0)
HEMOGLOBIN: 14 g/dL (ref 13.0–17.0)
LYMPHS ABS: 3.1 10*3/uL (ref 0.7–4.0)
LYMPHS PCT: 23 %
MCH: 32.8 pg (ref 26.0–34.0)
MCHC: 33.9 g/dL (ref 30.0–36.0)
MCV: 96.7 fL (ref 78.0–100.0)
Monocytes Absolute: 0.8 10*3/uL (ref 0.1–1.0)
Monocytes Relative: 6 %
Neutro Abs: 9.4 10*3/uL — ABNORMAL HIGH (ref 1.7–7.7)
Neutrophils Relative %: 70 %
PLATELETS: 232 10*3/uL (ref 150–400)
RBC: 4.27 MIL/uL (ref 4.22–5.81)
RDW: 13.2 % (ref 11.5–15.5)
WBC: 13.4 10*3/uL — ABNORMAL HIGH (ref 4.0–10.5)

## 2017-06-03 LAB — COMPREHENSIVE METABOLIC PANEL
ALBUMIN: 3.6 g/dL (ref 3.5–5.0)
ALT: 25 U/L (ref 17–63)
AST: 17 U/L (ref 15–41)
Alkaline Phosphatase: 127 U/L — ABNORMAL HIGH (ref 38–126)
Anion gap: 10 (ref 5–15)
BUN: 9 mg/dL (ref 6–20)
CHLORIDE: 105 mmol/L (ref 101–111)
CO2: 23 mmol/L (ref 22–32)
CREATININE: 1.04 mg/dL (ref 0.61–1.24)
Calcium: 8.7 mg/dL — ABNORMAL LOW (ref 8.9–10.3)
GFR calc Af Amer: >60 mL/min (ref >60)
GFR calc non Af Amer: >60 mL/min (ref >60)
Glucose, Bld: 116 mg/dL — ABNORMAL HIGH (ref 65–99)
POTASSIUM: 4.1 mmol/L (ref 3.5–5.1)
SODIUM: 138 mmol/L (ref 135–145)
Total Bilirubin: 0.6 mg/dL (ref 0.3–1.2)
Total Protein: 6.8 g/dL (ref 6.5–8.1)

## 2017-06-03 LAB — I-STAT CG4 LACTIC ACID, ED
LACTIC ACID, VENOUS: 1.7 mmol/L (ref 0.5–1.9)
LACTIC ACID, VENOUS: 0.51 mmol/L (ref 0.5–1.9)

## 2017-06-03 LAB — URINALYSIS, ROUTINE W REFLEX MICROSCOPIC
BILIRUBIN URINE: NEGATIVE
GLUCOSE, UA: NEGATIVE mg/dL
Hgb urine dipstick: NEGATIVE
Ketones, ur: NEGATIVE mg/dL
Leukocytes, UA: NEGATIVE
Nitrite: NEGATIVE
Protein, ur: NEGATIVE mg/dL
SPECIFIC GRAVITY, URINE: 1.025 (ref 1.005–1.030)
pH: 6 (ref 5.0–8.0)

## 2017-06-03 MED ORDER — IOPAMIDOL (ISOVUE-300) INJECTION 61%
INTRAVENOUS | Status: AC
  Administered 2017-06-03: 100 mL
  Filled 2017-06-03: qty 100

## 2017-06-03 MED ORDER — CLINDAMYCIN PHOSPHATE 600 MG/50ML IV SOLN
600.0000 mg | Freq: Once | INTRAVENOUS | Status: AC
  Administered 2017-06-03: 600 mg via INTRAVENOUS
  Filled 2017-06-03: qty 50

## 2017-06-03 NOTE — ED Notes (Signed)
Patient transported to CT 

## 2017-06-03 NOTE — ED Notes (Signed)
Patient back from CT.

## 2017-06-03 NOTE — ED Triage Notes (Signed)
Pt had inguinal hernia repair two weeks ago to L groin. Reports increased redness and swelling for the past couple of days after wearing blue jeans. Incision site red, swollen, with purulent drainage. Reports swelling has started spreading to R groin

## 2017-06-03 NOTE — ED Provider Notes (Signed)
[facility] EMERGENCY DEPARTMENT Provider Note   CSN: [id number] Arrival date & time: 06/03/17  9166     History   Chief Complaint Chief Complaint  Patient presents with  . Post-op Problem    HPI Aaron Kennedy is a 47 y.o. male.  Patient is a 47 year old male with a history of CVA, hypertension, hyperlipidemia, tobacco abuse with recent bilateral inguinal hernia repair on 05/07/2017 presenting today with 3 days of pain, erythema and minimal drainage over the left surgical site in the left inguinal area.  He states since the glue came off the cyst started.  He has noticed a little bit of redness of his leg but denies any testicular or scrotal symptoms.  He is urinating without difficulty.  He denies any fever.  Nausea vomiting or diarrhea.   The history is provided by the patient.    Past Medical History:  Diagnosis Date  . Hypertension   . Stroke [facility])     Patient Active Problem List   Diagnosis Date Noted  . Recurrent inguinal hernia 05/07/2017  . S/P hernia repair 05/07/2017  . Chronic pain of right knee 04/22/2016  . PAD (peripheral artery disease) (HCC) 05/02/2015  . Stroke (cerebrum) (HCC) 04/08/2015  . 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 CVA (cerebral vascular accident) (HCC) 04/03/2015  . Seizures (HCC) 04/03/2015  . Stroke (HCC) 03/22/2015  . HTN (hypertension) 03/22/2015  . HLD (hyperlipidemia) 03/22/2015  . Prediabetes 03/22/2015  . Tobacco use disorder 03/22/2015  . [name] (transient ischemic attack) 03/18/2015  . History of fracture of ankle   . Right ankle pain     Past Surgical History:  Procedure Laterality Date  . HERNIA REPAIR    . INGUINAL HERNIA REPAIR Bilateral 05/07/2017   Procedure: LAPAROSCOPIC RIGHT  INGUINAL HERNIA REPAIR AND  OPEN LEFT   INGUINAL EXPLORATION;  Surgeon: Axel Filler, MD;  Location: [facility] OR;  Service: General;  Laterality: Bilateral;  . INSERTION OF MESH Bilateral 05/07/2017    Procedure: INSERTION OF MESH;  Surgeon: Axel Filler, MD;  Location: [facility] OR;  Service: General;  Laterality: Bilateral;  . LEG SURGERY         Home Medications    Prior to Admission medications   Medication Sig Start Date End Date Taking? Authorizing Provider  amitriptyline (ELAVIL) 50 MG tablet Take 3 tablets (150 mg total) by mouth at bedtime. 02/11/17  Yes Massie Maroon, FNP  atorvastatin (LIPITOR) 80 MG tablet TAKE 1 TABLET BY MOUTH DAILY AT 6 PM. 12/30/16  Yes Bing Neighbors, FNP  clopidogrel (PLAVIX) 75 MG tablet Take 1 tablet (75 mg total) by mouth daily. 02/11/17  Yes Massie Maroon, FNP  gabapentin (NEURONTIN) 600 MG tablet TAKE 2 TABLETS BY MOUTH DAILY. 04/28/17  Yes Massie Maroon, FNP  lisinopril-hydrochlorothiazide (PRINZIDE,ZESTORETIC) 10-12.5 MG tablet Take 1 tablet by mouth daily. 02/11/17  Yes Massie Maroon, FNP  metFORMIN (GLUCOPHAGE) 500 MG tablet Take 1 tablet (500 mg total) by mouth 2 (two) times daily with a meal. 12/30/16  Yes Bing Neighbors, FNP  metoprolol succinate (TOPROL-XL) 25 MG 24 hr tablet TAKE 1 TABLET BY MOUTH DAILY. 02/11/17  Yes Massie Maroon, FNP  mupirocin ointment (BACTROBAN) 2 % Apply 1 application topically 3 (three) times daily. Patient taking differently: Apply 1 application topically 3 (three) times daily as needed (MRSA FLARE UP).  12/24/16  Yes Bing Neighbors, FNP  oxyCODONE (OXY IR/ROXICODONE) 5 MG immediate release tablet Take  1 tablet (5 mg total) by mouth every 4 (four) hours as needed for severe pain. 05/08/17  Yes Rayburn, Alphonsus Sias, PA-C    Family History Family History  Problem Relation Age of Onset  . Stroke Brother        35s  . Heart attack Sister        62s    Social History Social History   Tobacco Use  . Smoking status: Current Every Day Smoker    Packs/day: 0.50    Years: 20.00    Pack years: 10.00    Types: Cigarettes  . Smokeless tobacco: Never Used  Substance Use Topics  . Alcohol  use: No  . Drug use: Yes    Types: Marijuana     Allergies   Patient has no known allergies.   Review of Systems Review of Systems  All other systems reviewed and are negative.    Physical Exam Updated Vital Signs BP 129/81   Pulse 84   Temp 99.3 F (37.4 C)   Resp 16   SpO2 98%   Physical Exam  Constitutional: He is oriented to person, place, and time. He appears well-developed and well-nourished. No distress.  HENT:  Head: Normocephalic and atraumatic.  Mouth/Throat: Oropharynx is clear and moist.  Eyes: Conjunctivae and EOM are normal. Pupils are equal, round, and reactive to light.  Neck: Normal range of motion. Neck supple.  Cardiovascular: Normal rate, regular rhythm and intact distal pulses.  No murmur heard. Pulmonary/Chest: Effort normal and breath sounds normal. No respiratory distress. He has no wheezes. He has no rales.  Abdominal: Soft. He exhibits no distension. There is tenderness. There is no rebound and no guarding.    Genitourinary: Testes normal. Circumcised.  Musculoskeletal: Normal range of motion. He exhibits no edema or tenderness.  Neurological: He is alert and oriented to person, place, and time.  Skin: Skin is warm and dry. No rash noted. No erythema.  Psychiatric: He has a normal mood and affect. His behavior is normal.  Nursing note and vitals reviewed.    ED Treatments / Results  Labs (all labs ordered are listed, but only abnormal results are displayed) Labs Reviewed  COMPREHENSIVE METABOLIC PANEL - Abnormal; Notable for the following components:      Result Value   Glucose, Bld 116 (*)    Calcium 8.7 (*)    Alkaline Phosphatase 127 (*)    All other components within normal limits  CBC WITH DIFFERENTIAL/PLATELET - Abnormal; Notable for the following components:   WBC 13.4 (*)    Neutro Abs 9.4 (*)    All other components within normal limits  URINALYSIS, ROUTINE W REFLEX MICROSCOPIC - Abnormal; Notable for the following  components:   APPearance HAZY (*)    All other components within normal limits  I-STAT CG4 LACTIC ACID, ED  I-STAT CG4 LACTIC ACID, ED    EKG  EKG Interpretation None       Radiology Ct Abdomen Pelvis W Contrast  Result Date: 06/03/2017 CLINICAL DATA:  47 year old male status post laparoscopic right and open left inguinal hernia repair in January. Increased pain redness and swelling at the incision site, now with purulent drainage. EXAM: CT ABDOMEN AND PELVIS WITH CONTRAST TECHNIQUE: Multidetector CT imaging of the abdomen and pelvis was performed using the standard protocol following bolus administration of intravenous contrast. CONTRAST:  ISOVUE-300 IOPAMIDOL (ISOVUE-300) INJECTION 61% COMPARISON:  CT Abdomen and Pelvis 05/07/2017, and earlier. FINDINGS: Lower chest: Negative lung bases; mild dependent  atelectasis. No pericardial or pleural effusion. Hepatobiliary: Decompressed gallbladder.  Negative liver. Pancreas: Negative. Spleen: Negative. Adrenals/Urinary Tract: Normal adrenal glands. Stable and negative kidneys. Bilateral renal enhancement and contrast excretion is symmetric and normal. Unremarkable ureters. The urinary bladder remains within normal limits. Stomach/Bowel: Decompressed distal colon and left colon. Negative transverse colon. Negative right colon and appendix. Negative terminal ileum. No dilated or abnormal small bowel loops. Negative stomach and duodenum. No abdominal free air or free fluid. Vascular/Lymphatic: Aortoiliac calcified atherosclerosis. 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 The right external and internal iliac arteries appear patent. The other major arterial structures in the abdomen and pelvis are patent. The portal venous system is patent. There is bilateral inguinal lymphadenopathy which appears reactive. Nodes demonstrate enlargement but maintained fatty hila. No iliac station or  other lymphadenopathy. Reproductive: The right inguinal hernia no longer contains bowel, but does contain a 6 centimeter oval collection complex fluid or a highly edematous fat (series 3, image 95 and coronal image 38). The contralateral left inguinal hernia demonstrates moderately to severely increased regional soft tissue inflammation which has the appearance of phlegmon. There is a superimposed indistinct low to intermediate density collection near the epicenter of the inflammation encompassing 26 x 43 x 32 millimeters (AP by transverse by CC). See series 3, image 94 and coronal image 32). The subcutaneous and inguinal inflammation tracks distally toward the scrotum and across midline at the mons. There is associated skin and dermal thickening at the base of the penis (series 3, image 108). No soft tissue gas. There are deep soft tissue staples at the space of Retzius. And no inflammatory changes deep to this soft tissue plane. Other: No pelvic free fluid. Musculoskeletal: Chronic left hip ORIF. No acute osseous abnormality identified. IMPRESSION: 1. Inflammatory phlegmon at the left inguinal hernia repair with underlying indistinct 4 cm collection which likely reflects a developing abscess (see series 3, image 94 and coronal image 32). There is a larger and more organized 6 centimeter complex fluid collection within the right inguinal hernia which is nonspecific and might represent seroma rather than abscess (see series 3, image 95 and coronal image 38). 2. Regional soft tissue inflammation involving the space of Retzius, mons, and base of the penis. No soft tissue gas to suggest and necrotizing infection. No inflammation deep to the space of Retzius or within the abdomen or pelvis. 3. 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 The right external an element turn ule iliac arteries are reconstituted. Aortic Atherosclerosis (ICD10-I70.0). Electronically Signed   By: Odessa Fleming M.D.   On: 06/03/2017 23:51    Procedures Procedures (including critical care time)  Medications Ordered in ED Medications  clindamycin (CLEOCIN) IVPB 600 mg (600 mg Intravenous New Bag/Given 06/03/17 2244)  iopamidol (ISOVUE-300) 61 % injection (not administered)     Initial Impression / Assessment and Plan / ED Course  I have reviewed the triage vital signs and the nursing notes.  Pertinent labs & imaging results that were available during my care of the patient were reviewed by me and considered in my medical decision making (see chart for details).    Patient presenting with surgical wound infection of his left inguinal hernia repair which was open.  He is not having systemic symptoms but does have a temperature of 99.3 orally.  White count of 13 but rest of labs are reassuring.  Spoke with Dr. Sheliah Hatch with  general surgery who recommended doing a CT.  Patient started on IV clindamycin.  11:56 PM CT shows developing abscess.  Final Clinical Impressions(s) / ED Diagnoses   Final diagnoses:  Post op infection  Abscess    ED Discharge Orders    None       Gwyneth Sprout, MD 06/03/17 2359

## 2017-06-03 NOTE — H&P (Signed)
Aaron Kennedy is an 47 y.o. male.   Chief Complaint: left groin pain HPI: 47 yo male 3 weeks out from right laparoscopic repair and left groin exploration presents with 3 days of redness and drainage in his left groin. He denies fevers. The area is very uncomfortable. He notes wearing a tighter pair of pants as starting the incident.  Past Medical History:  Diagnosis Date  . Hypertension   . Stroke [facility])     Past Surgical History:  Procedure Laterality Date  . HERNIA REPAIR    . INGUINAL HERNIA REPAIR Bilateral 05/07/2017   Procedure: LAPAROSCOPIC RIGHT  INGUINAL HERNIA REPAIR AND  OPEN LEFT   INGUINAL EXPLORATION;  Surgeon: Ralene Ok, MD;  Location: [place];  Service: General;  Laterality: Bilateral;  . INSERTION OF MESH Bilateral 05/07/2017   Procedure: INSERTION OF MESH;  Surgeon: Ralene Ok, MD;  Location: [place];  Service: General;  Laterality: Bilateral;  . LEG SURGERY      Family History  Problem Relation Age of Onset  . Stroke Brother        42s  . Heart attack Sister        75s   Social History:  reports that he has been smoking cigarettes.  He has a 10.00 pack-year smoking history. he has never used smokeless tobacco. He reports that he uses drugs. Drug: Marijuana. He reports that he does not drink alcohol.  Allergies: No Known Allergies   (Not in a hospital admission)  Results for orders placed or performed during the hospital encounter of 06/03/17 (from the past 48 hour(s))  Comprehensive metabolic panel     Status: Abnormal   Collection Time: 06/03/17  7:47 PM  Result Value Ref Range   Sodium 138 135 - 145 mmol/L   Potassium 4.1 3.5 - 5.1 mmol/L   Chloride 105 101 - 111 mmol/L   CO2 23 22 - 32 mmol/L   Glucose, Bld 116 (H) 65 - 99 mg/dL   BUN 9 6 - 20 mg/dL   Creatinine, Ser 1.04 0.61 - 1.24 mg/dL   Calcium 8.7 (L) 8.9 - 10.3 mg/dL   Total Protein 6.8 6.5 - 8.1 g/dL   Albumin 3.6 3.5 - 5.0 g/dL   AST 17 15 - 41 U/L   ALT 25 17 - 63 U/L   Alkaline Phosphatase 127 (H) 38 - 126 U/L   Total Bilirubin 0.6 0.3 - 1.2 mg/dL   GFR calc non Af Amer >60 >60 mL/min   GFR calc Af Amer >60 >60 mL/min    Comment: (NOTE) The eGFR has been calculated using the CKD EPI equation. This calculation has not been validated in all clinical situations. eGFR's persistently <60 mL/min signify possible Chronic Kidney Disease.    Anion gap 10 5 - 15    Comment: Performed at [facility] [address], [place], [place] [address]  CBC with Differential     Status: Abnormal   Collection Time: 06/03/17  7:47 PM  Result Value Ref Range   WBC 13.4 (H) 4.0 - 10.5 K/uL   RBC 4.27 4.22 - 5.81 MIL/uL   Hemoglobin 14.0 13.0 - 17.0 g/dL   HCT 41.3 39.0 - 52.0 %   MCV 96.7 78.0 - 100.0 fL   MCH 32.8 26.0 - 34.0 pg   MCHC 33.9 30.0 - 36.0 g/dL   RDW 13.2 11.5 - 15.5 %   Platelets 232 150 - 400 K/uL   Neutrophils Relative % 70 %  Neutro Abs 9.4 (H) 1.7 - 7.7 K/uL   Lymphocytes Relative 23 %   Lymphs Abs 3.1 0.7 - 4.0 K/uL   Monocytes Relative 6 %   Monocytes Absolute 0.8 0.1 - 1.0 K/uL   Eosinophils Relative 1 %   Eosinophils Absolute 0.1 0.0 - 0.7 K/uL   Basophils Relative 0 %   Basophils Absolute 0.0 0.0 - 0.1 K/uL    Comment: Performed at [facility] [address], [place], [place] [address]  Urinalysis, Routine w reflex microscopic     Status: Abnormal   Collection Time: 06/03/17  8:00 PM  Result Value Ref Range   Color, Urine YELLOW YELLOW   APPearance HAZY (A) CLEAR   Specific Gravity, Urine 1.025 1.005 - 1.030   pH 6.0 5.0 - 8.0   Glucose, UA NEGATIVE NEGATIVE mg/dL   Hgb urine dipstick NEGATIVE NEGATIVE   Bilirubin Urine NEGATIVE NEGATIVE   Ketones, ur NEGATIVE NEGATIVE mg/dL   Protein, ur NEGATIVE NEGATIVE mg/dL   Nitrite NEGATIVE NEGATIVE   Leukocytes, UA NEGATIVE NEGATIVE    Comment: Performed at [facility] [address] [place], [place] [address]  I-Stat CG4 Lactic Acid, ED     Status: None    Collection Time: 06/03/17  8:50 PM  Result Value Ref Range   Lactic Acid, Venous 1.70 0.5 - 1.9 mmol/L  I-Stat CG4 Lactic Acid, ED     Status: None   Collection Time: 06/03/17  9:38 PM  Result Value Ref Range   Lactic Acid, Venous 0.51 0.5 - 1.9 mmol/L   No results found.  Review of Systems  Constitutional: Negative for chills and fever.  HENT: Negative for hearing loss.   Eyes: Negative for blurred vision and double vision.  Respiratory: Negative for cough and hemoptysis.   Cardiovascular: Negative for chest pain and palpitations.  Gastrointestinal: Positive for abdominal pain. Negative for nausea and vomiting.  Genitourinary: Negative for dysuria and urgency.  Musculoskeletal: Negative for myalgias and neck pain.  Skin: Negative for itching and rash.  Neurological: Negative for dizziness, tingling and headaches.  Endo/Heme/Allergies: Does not bruise/bleed easily.  Psychiatric/Behavioral: Negative for depression and suicidal ideas.    Blood pressure 129/81, pulse 84, temperature 99.3 F (37.4 C), resp. rate 16, SpO2 98 %. Physical Exam  Vitals reviewed. Constitutional: He is oriented to person, place, and time. He appears well-developed and well-nourished.  HENT:  Head: Normocephalic and atraumatic.  Eyes: Conjunctivae and EOM are normal. Pupils are equal, round, and reactive to light.  Neck: Normal range of motion. Neck supple.  Cardiovascular: Normal rate and regular rhythm.  Respiratory: Effort normal and breath sounds normal.  GI: Soft. Bowel sounds are normal. He exhibits no distension.  Left groin erythema, small amount of white drainage on palpation of the incision, right groin has no erythema  Musculoskeletal: Normal range of motion.  Neurological: He is alert and oriented to person, place, and time.  Skin: Skin is warm and dry.  Psychiatric: He has a normal mood and affect. His behavior is normal.     Assessment/Plan 47 yo male with erythema and pain and small  amount of drainage. The patient is moving to CT for further evaluation. -antibiotics -possible drainage procedure  Mickeal Skinner, MD 06/03/2017, 11:00 PM

## 2017-06-04 ENCOUNTER — Other Ambulatory Visit: Payer: Self-pay

## 2017-06-04 DIAGNOSIS — L0291 Cutaneous abscess, unspecified: Secondary | ICD-10-CM | POA: Diagnosis present

## 2017-06-04 LAB — CBC
HCT: 40.5 % (ref 39.0–52.0)
Hemoglobin: 13.7 g/dL (ref 13.0–17.0)
MCH: 32.6 pg (ref 26.0–34.0)
MCHC: 33.8 g/dL (ref 30.0–36.0)
MCV: 96.4 fL (ref 78.0–100.0)
PLATELETS: 205 10*3/uL (ref 150–400)
RBC: 4.2 MIL/uL — AB (ref 4.22–5.81)
RDW: 13.3 % (ref 11.5–15.5)
WBC: 11.1 10*3/uL — ABNORMAL HIGH (ref 4.0–10.5)

## 2017-06-04 MED ORDER — ONDANSETRON 4 MG PO TBDP: 4.0000 mg | ORAL_TABLET | Freq: Four times a day (QID) | ORAL | Status: DC | PRN

## 2017-06-04 MED ORDER — CLINDAMYCIN HCL 300 MG PO CAPS: 300.0000 mg | ORAL_CAPSULE | Freq: Three times a day (TID) | ORAL | 0 refills | Status: AC

## 2017-06-04 MED ORDER — HYDROCHLOROTHIAZIDE 12.5 MG PO CAPS
12.5000 mg | ORAL_CAPSULE | Freq: Every day | ORAL | Status: DC
  Administered 2017-06-04: 12.5 mg via ORAL
  Filled 2017-06-04: qty 1

## 2017-06-04 MED ORDER — OXYCODONE HCL 5 MG PO TABS: 5.0000 mg | ORAL_TABLET | ORAL | 0 refills | Status: DC | PRN

## 2017-06-04 MED ORDER — LISINOPRIL 10 MG PO TABS
10.0000 mg | ORAL_TABLET | Freq: Every day | ORAL | Status: DC
  Administered 2017-06-04: 10 mg via ORAL
  Filled 2017-06-04: qty 1

## 2017-06-04 MED ORDER — ACETAMINOPHEN 650 MG RE SUPP: 650.0000 mg | Freq: Four times a day (QID) | RECTAL | Status: DC | PRN

## 2017-06-04 MED ORDER — DIPHENHYDRAMINE HCL 50 MG/ML IJ SOLN: 25.0000 mg | Freq: Four times a day (QID) | INTRAMUSCULAR | Status: DC | PRN

## 2017-06-04 MED ORDER — DIPHENHYDRAMINE HCL 25 MG PO CAPS: 25.0000 mg | ORAL_CAPSULE | Freq: Four times a day (QID) | ORAL | Status: DC | PRN

## 2017-06-04 MED ORDER — METOPROLOL SUCCINATE ER 25 MG PO TB24
25.0000 mg | ORAL_TABLET | Freq: Every day | ORAL | Status: DC
  Administered 2017-06-04: 25 mg via ORAL
  Filled 2017-06-04: qty 1

## 2017-06-04 MED ORDER — MORPHINE SULFATE (PF) 4 MG/ML IV SOLN: 2.0000 mg | INTRAVENOUS | Status: DC | PRN

## 2017-06-04 MED ORDER — SACCHAROMYCES BOULARDII 250 MG PO CAPS
250.0000 mg | ORAL_CAPSULE | Freq: Two times a day (BID) | ORAL | Status: DC
  Administered 2017-06-04: 250 mg via ORAL
  Filled 2017-06-04: qty 1

## 2017-06-04 MED ORDER — ATORVASTATIN CALCIUM 20 MG PO TABS: 20.0000 mg | ORAL_TABLET | Freq: Every day | ORAL | Status: DC

## 2017-06-04 MED ORDER — OXYCODONE HCL 5 MG PO TABS
5.0000 mg | ORAL_TABLET | ORAL | Status: DC | PRN
  Administered 2017-06-04 (×2): 10 mg via ORAL
  Filled 2017-06-04 (×2): qty 2

## 2017-06-04 MED ORDER — ONDANSETRON HCL 4 MG/2ML IJ SOLN: 4.0000 mg | Freq: Four times a day (QID) | INTRAMUSCULAR | Status: DC | PRN

## 2017-06-04 MED ORDER — ACETAMINOPHEN 325 MG PO TABS
650.0000 mg | ORAL_TABLET | Freq: Four times a day (QID) | ORAL | Status: DC | PRN
  Administered 2017-06-04: 650 mg via ORAL
  Filled 2017-06-04: qty 2

## 2017-06-04 MED ORDER — CLINDAMYCIN PHOSPHATE 600 MG/50ML IV SOLN
600.0000 mg | Freq: Three times a day (TID) | INTRAVENOUS | Status: DC
  Administered 2017-06-04: 600 mg via INTRAVENOUS
  Filled 2017-06-04 (×2): qty 50

## 2017-06-04 MED ORDER — LISINOPRIL-HYDROCHLOROTHIAZIDE 10-12.5 MG PO TABS: 1.0000 | ORAL_TABLET | Freq: Every day | ORAL | Status: DC

## 2017-06-04 MED ORDER — AMITRIPTYLINE HCL 25 MG PO TABS
150.0000 mg | ORAL_TABLET | Freq: Every day | ORAL | Status: DC
  Administered 2017-06-04: 150 mg via ORAL
  Filled 2017-06-04: qty 6

## 2017-06-04 MED ORDER — METFORMIN HCL 500 MG PO TABS
500.0000 mg | ORAL_TABLET | Freq: Two times a day (BID) | ORAL | Status: DC
  Administered 2017-06-04: 500 mg via ORAL
  Filled 2017-06-04: qty 1

## 2017-06-04 MED ORDER — GABAPENTIN 600 MG PO TABS
1200.0000 mg | ORAL_TABLET | Freq: Every day | ORAL | Status: DC
  Administered 2017-06-04: 1200 mg via ORAL
  Filled 2017-06-04: qty 2

## 2017-06-04 MED ORDER — HYDRALAZINE HCL 20 MG/ML IJ SOLN: 10.0000 mg | INTRAMUSCULAR | Status: DC | PRN

## 2017-06-04 MED ORDER — SACCHAROMYCES BOULARDII 250 MG PO CAPS: 250.0000 mg | ORAL_CAPSULE | Freq: Two times a day (BID) | ORAL | Status: DC

## 2017-06-04 NOTE — Procedures (Signed)
Preop: left groin abscess  Postop: same  Procedure: incision and drainage left groin abscess  AFter discussing with the patient the findings of fluid collection concerning for abscess in the left groin and the plan to perform a bedside drainage procedure under local anesthetic, the area was prepped in the usual fashion. Next, 83ml of 1% lidocaine was injected into the skin tissues. Anesthesia was tested and found intact. Next, an incision was made and a small portion of skin was removed. A moderate amount of reddish purulence was drained. The pus was collected for specimen. The wound was probed and then packed with gauze. The patient tolerated the procedure well.  EBL: 49ml  Specimen: abscess left groin  Complications: none

## 2017-06-04 NOTE — Progress Notes (Signed)
Patient admitted form Ed with left groin abscess. Gauze saturated with blood to the gown. Dressing to left groin changed. Patient complaining of headache and tylenol given as ordered. Oriented to room and surroundings.

## 2017-06-04 NOTE — Discharge Summary (Signed)
Physician Discharge Summary  Patient ID: Aaron Kennedy MRN: [id number] DOB/AGE: 1970-08-29 47 y.o.  Admit date: 06/03/2017 Discharge date: 06/04/2017  Admission Diagnoses: wound infection  Discharge Diagnoses:  Active Problems:   Abscess wound infection and hematoma  Discharged Condition: good  Hospital Course: pt admitted overnight after drainage of L groin wound infection.  He feels much better this am.    Consults: None  Significant Diagnostic Studies: labs: cbc  Treatments: I&D L groin  Discharge Exam: Blood pressure 118/86, pulse 64, temperature (!) 97.3 F (36.3 C), temperature source Oral, resp. rate 18, SpO2 96 %. General appearance: alert and cooperative Incision/Wound: mild erythema, no bleeding, packed  Disposition: 01-Home or Self Care   Allergies as of 06/04/2017   No Known Allergies     Medication List    TAKE these medications   amitriptyline 50 MG tablet Commonly known as:  ELAVIL Take 3 tablets (150 mg total) by mouth at bedtime.   atorvastatin 80 MG tablet Commonly known as:  LIPITOR TAKE 1 TABLET BY MOUTH DAILY AT 6 PM.   clindamycin 300 MG capsule Commonly known as:  CLEOCIN Take 1 capsule (300 mg total) by mouth 3 (three) times daily for 5 days.   clopidogrel 75 MG tablet Commonly known as:  PLAVIX Take 1 tablet (75 mg total) by mouth daily.   gabapentin 600 MG tablet Commonly known as:  NEURONTIN TAKE 2 TABLETS BY MOUTH DAILY.   lisinopril-hydrochlorothiazide 10-12.5 MG tablet Commonly known as:  PRINZIDE,ZESTORETIC Take 1 tablet by mouth daily.   metFORMIN 500 MG tablet Commonly known as:  GLUCOPHAGE Take 1 tablet (500 mg total) by mouth 2 (two) times daily with a meal.   metoprolol succinate 25 MG 24 hr tablet Commonly known as:  TOPROL-XL TAKE 1 TABLET BY MOUTH DAILY.   mupirocin ointment 2 % Commonly known as:  BACTROBAN Apply 1 application topically 3 (three) times daily. What changed:    when to take  this  reasons to take this   oxyCODONE 5 MG immediate release tablet Commonly known as:  Oxy IR/ROXICODONE Take 1 tablet (5 mg total) by mouth every 4 (four) hours as needed for severe pain.   saccharomyces boulardii 250 MG capsule Commonly known as:  FLORASTOR Take 1 capsule (250 mg total) by mouth 2 (two) times daily.      Follow-up Information    Axel Filler, MD. Go to.   Specialty:  General Surgery Why:  your apt as scheduled Contact information: [address] [place] [phone]           Signed: Vanita Panda 06/04/2017, 8:50 AM

## 2017-06-04 NOTE — Discharge Instructions (Signed)
Skin Abscess A skin abscess is an infected area on or under your skin that contains pus and other material.  Treatment usually involves draining the abscess. Follow these instructions at home: Abscess Care Make sure you: ? Cover the abscess with a bandage (dressing). ? Change your bandage and gauze daily after cleaning with soap and water ? Wash your hands with soap and water before you change the bandage or gauze. If you cannot use soap and water, use hand sanitizer.  Check your abscess every day for signs that the infection is getting worse. Check for: ? More redness, swelling, or pain. ? More fluid or blood. ? Warmth. ? More pus or a bad smell. Medicines   Take over-the-counter and prescription medicines only as told by your doctor.  If you were prescribed an antibiotic medicine, take it as told by your doctor. Do not stop taking the antibiotic even if you start to feel better. General instructions  To avoid spreading the infection: ? Do not share personal care items, towels, or hot tubs with others. ? Avoid making skin-to-skin contact with other people.  Keep all follow-up visits as told by your doctor. This is important. Contact a doctor if:  You have more redness, swelling, or pain around your abscess.  You have more fluid or blood coming from your abscess.  Your abscess feels warm when you touch it.  You have more pus or a bad smell coming from your abscess.  You have a fever.  Your muscles ache.  You have chills.  You feel sick. Get help right away if:  You have very bad (severe) pain.  You see red streaks on your skin spreading away from the abscess. This information is not intended to replace advice given to you by your health care provider. Make sure you discuss any questions you have with your health care provider. Document Released: 09/22/2007 Document Revised: 11/30/2015 Document Reviewed: 02/12/2015 Elsevier Interactive Patient Education  [employer].

## 2017-06-04 NOTE — Progress Notes (Signed)
Discharge instruction given to patient along with care to wound.  He advised he had f/u appointment with MD on Wednesday.  He was educated on not to drive will taking opioids.  Pt was given his prescriptions and advised on OTC medication to pick up from pharmacy. Pt left ambulatory

## 2017-06-08 ENCOUNTER — Other Ambulatory Visit: Payer: Self-pay | Admitting: Family Medicine

## 2017-06-08 MED FILL — LISINOPRIL-HCTZ 10-12.5 MG: 10-12.5 | 30 days supply | Qty: 30 | Fill #10

## 2017-06-08 MED FILL — ?CLOPIDOGREL 75MG TA: 75 | 30 days supply | Qty: 30 | Fill #1

## 2017-06-08 MED FILL — GABAPENTIN 600 MG TABLET: 600 | 30 days supply | Qty: 60 | Fill #1

## 2017-06-08 MED FILL — ?AMITRIPTYLINE HCL 50MG TAB: 50 | 30 days supply | Qty: 90 | Fill #0

## 2017-06-08 MED FILL — metFORMIN HCL 500 MG TABS: 500 | 30 days supply | Qty: 60 | Fill #3

## 2017-06-08 MED FILL — METOPROLOL SUCCINATE ER 25: 25 | 30 days supply | Qty: 30 | Fill #3

## 2017-06-09 LAB — AEROBIC/ANAEROBIC CULTURE (SURGICAL/DEEP WOUND)

## 2017-06-09 LAB — AEROBIC/ANAEROBIC CULTURE W GRAM STAIN (SURGICAL/DEEP WOUND)

## 2017-07-20 ENCOUNTER — Other Ambulatory Visit: Payer: Self-pay

## 2017-07-20 ENCOUNTER — Other Ambulatory Visit: Payer: Self-pay | Admitting: Family Medicine

## 2017-07-20 ENCOUNTER — Observation Stay (HOSPITAL_COMMUNITY)
Admission: EM | Admit: 2017-07-20 | Discharge: 2017-07-22 | Disposition: A | Discharge status: Home / Self Care | Payer: Self-pay | Attending: Emergency Medicine | Admitting: Emergency Medicine

## 2017-07-20 ENCOUNTER — Encounter (HOSPITAL_COMMUNITY): Payer: Self-pay

## 2017-07-20 DIAGNOSIS — Z8673 Personal history of transient ischemic attack (TIA), and cerebral infarction without residual deficits: Secondary | ICD-10-CM | POA: Insufficient documentation

## 2017-07-20 DIAGNOSIS — E119 Type 2 diabetes mellitus without complications: Secondary | ICD-10-CM | POA: Insufficient documentation

## 2017-07-20 DIAGNOSIS — I119 Hypertensive heart disease without heart failure: Secondary | ICD-10-CM | POA: Insufficient documentation

## 2017-07-20 DIAGNOSIS — L02214 Cutaneous abscess of groin: Principal | ICD-10-CM | POA: Diagnosis present

## 2017-07-20 DIAGNOSIS — E785 Hyperlipidemia, unspecified: Secondary | ICD-10-CM | POA: Insufficient documentation

## 2017-07-20 DIAGNOSIS — Z7902 Long term (current) use of antithrombotics/antiplatelets: Secondary | ICD-10-CM | POA: Insufficient documentation

## 2017-07-20 DIAGNOSIS — I10 Essential (primary) hypertension: Secondary | ICD-10-CM

## 2017-07-20 DIAGNOSIS — Z7984 Long term (current) use of oral hypoglycemic drugs: Secondary | ICD-10-CM | POA: Insufficient documentation

## 2017-07-20 DIAGNOSIS — F17210 Nicotine dependence, cigarettes, uncomplicated: Secondary | ICD-10-CM | POA: Insufficient documentation

## 2017-07-20 DIAGNOSIS — R569 Unspecified convulsions: Secondary | ICD-10-CM | POA: Insufficient documentation

## 2017-07-20 DIAGNOSIS — Z79899 Other long term (current) drug therapy: Secondary | ICD-10-CM | POA: Insufficient documentation

## 2017-07-20 MED FILL — METOPROLOL SUCCINATE ER 25: 25 | 30 days supply | Qty: 30 | Fill #4

## 2017-07-20 MED FILL — metFORMIN HCL 500 MG TABS: 500 | 30 days supply | Qty: 60 | Fill #4

## 2017-07-20 MED FILL — CLOPIDOGREL 75 MG TABLET: 75 | 30 days supply | Qty: 30 | Fill #2

## 2017-07-20 MED FILL — LISINOPRIL-HCTZ 10-12.5 MG: 10-12.5 | 30 days supply | Qty: 30 | Fill #0

## 2017-07-20 MED FILL — ?AMITRIPTYLINE HCL 50MG TAB: 50 | 30 days supply | Qty: 90 | Fill #0

## 2017-07-20 NOTE — ED Triage Notes (Signed)
Reports L sided inguinal hernia after coughing today  hx of surgery for hernia in same area.

## 2017-07-21 ENCOUNTER — Emergency Department (HOSPITAL_COMMUNITY): Payer: Self-pay

## 2017-07-21 ENCOUNTER — Emergency Department (HOSPITAL_COMMUNITY): Payer: Self-pay | Admitting: Anesthesiology

## 2017-07-21 ENCOUNTER — Encounter (HOSPITAL_COMMUNITY)
Admission: EM | Disposition: A | Discharge status: Home / Self Care | Payer: Self-pay | Source: Home / Self Care | Attending: Emergency Medicine

## 2017-07-21 DIAGNOSIS — L02214 Cutaneous abscess of groin: Secondary | ICD-10-CM | POA: Diagnosis present

## 2017-07-21 HISTORY — PX: INCISION AND DRAINAGE ABSCESS: SHX5864

## 2017-07-21 LAB — CBC WITH DIFFERENTIAL/PLATELET
Basophils Absolute: 0 K/uL (ref 0.0–0.1)
Basophils Relative: 0 %
Eosinophils Absolute: 0.1 K/uL (ref 0.0–0.7)
Eosinophils Relative: 1 %
HCT: 40.4 % (ref 39.0–52.0)
Hemoglobin: 13.5 g/dL (ref 13.0–17.0)
Lymphocytes Relative: 31 %
Lymphs Abs: 3.2 K/uL (ref 0.7–4.0)
MCH: 32.4 pg (ref 26.0–34.0)
MCHC: 33.4 g/dL (ref 30.0–36.0)
MCV: 96.9 fL (ref 78.0–100.0)
Monocytes Absolute: 0.7 K/uL (ref 0.1–1.0)
Monocytes Relative: 7 %
Neutro Abs: 6.4 K/uL (ref 1.7–7.7)
Neutrophils Relative %: 61 %
Platelets: 212 K/uL (ref 150–400)
RBC: 4.17 MIL/uL — ABNORMAL LOW (ref 4.22–5.81)
RDW: 13.5 % (ref 11.5–15.5)
WBC: 10.4 K/uL (ref 4.0–10.5)

## 2017-07-21 LAB — COMPREHENSIVE METABOLIC PANEL WITH GFR
ALT: 18 U/L (ref 17–63)
AST: 12 U/L — ABNORMAL LOW (ref 15–41)
Albumin: 3.3 g/dL — ABNORMAL LOW (ref 3.5–5.0)
Alkaline Phosphatase: 115 U/L (ref 38–126)
Anion gap: 8 (ref 5–15)
BUN: 8 mg/dL (ref 6–20)
CO2: 24 mmol/L (ref 22–32)
Calcium: 8.2 mg/dL — ABNORMAL LOW (ref 8.9–10.3)
Chloride: 103 mmol/L (ref 101–111)
Creatinine, Ser: 0.91 mg/dL (ref 0.61–1.24)
GFR calc Af Amer: >60 mL/min
GFR calc non Af Amer: >60 mL/min
Glucose, Bld: 91 mg/dL (ref 65–99)
Potassium: 3.9 mmol/L (ref 3.5–5.1)
Sodium: 135 mmol/L (ref 135–145)
Total Bilirubin: 0.8 mg/dL (ref 0.3–1.2)
Total Protein: 6.1 g/dL — ABNORMAL LOW (ref 6.5–8.1)

## 2017-07-21 LAB — GLUCOSE, CAPILLARY
GLUCOSE-CAPILLARY: 114 mg/dL — AB (ref 65–99)
GLUCOSE-CAPILLARY: 106 mg/dL — AB (ref 65–99)
GLUCOSE-CAPILLARY: 149 mg/dL — AB (ref 65–99)

## 2017-07-21 LAB — I-STAT CG4 LACTIC ACID, ED
Lactic Acid, Venous: 0.84 mmol/L (ref 0.5–1.9)
Lactic Acid, Venous: 0.42 mmol/L — ABNORMAL LOW (ref 0.5–1.9)

## 2017-07-21 LAB — LIPASE, BLOOD: Lipase: 26 U/L (ref 11–51)

## 2017-07-21 SURGERY — INCISION AND DRAINAGE, ABSCESS
Anesthesia: General | Site: Groin | Laterality: Left

## 2017-07-21 MED ORDER — ONDANSETRON HCL 4 MG/2ML IJ SOLN
INTRAMUSCULAR | Status: DC | PRN
  Administered 2017-07-21: 4 mg via INTRAVENOUS

## 2017-07-21 MED ORDER — ONDANSETRON 4 MG PO TBDP: 4.0000 mg | ORAL_TABLET | Freq: Four times a day (QID) | ORAL | Status: DC | PRN

## 2017-07-21 MED ORDER — CIPROFLOXACIN IN D5W 400 MG/200ML IV SOLN
400.0000 mg | INTRAVENOUS | Status: AC
  Administered 2017-07-21: 400 mg via INTRAVENOUS

## 2017-07-21 MED ORDER — CIPROFLOXACIN IN D5W 400 MG/200ML IV SOLN
INTRAVENOUS | Status: AC
  Filled 2017-07-21: qty 200

## 2017-07-21 MED ORDER — CLOPIDOGREL BISULFATE 75 MG PO TABS
75.0000 mg | ORAL_TABLET | Freq: Every day | ORAL | Status: DC
  Administered 2017-07-21 – 2017-07-22 (×2): 75 mg via ORAL
  Filled 2017-07-21 (×2): qty 1

## 2017-07-21 MED ORDER — MORPHINE SULFATE (PF) 4 MG/ML IV SOLN: 2.0000 mg | INTRAVENOUS | Status: DC | PRN

## 2017-07-21 MED ORDER — LIDOCAINE HCL (CARDIAC) 20 MG/ML IV SOLN
INTRAVENOUS | Status: AC
  Filled 2017-07-21: qty 5

## 2017-07-21 MED ORDER — LIDOCAINE HCL (CARDIAC) 20 MG/ML IV SOLN
INTRAVENOUS | Status: DC | PRN
  Administered 2017-07-21: 100 mg via INTRAVENOUS

## 2017-07-21 MED ORDER — MIDAZOLAM HCL 5 MG/5ML IJ SOLN
INTRAMUSCULAR | Status: DC | PRN
  Administered 2017-07-21: 2 mg via INTRAVENOUS

## 2017-07-21 MED ORDER — GABAPENTIN 600 MG PO TABS
1200.0000 mg | ORAL_TABLET | Freq: Every day | ORAL | Status: DC
  Administered 2017-07-21 – 2017-07-22 (×2): 1200 mg via ORAL
  Filled 2017-07-21 (×2): qty 2

## 2017-07-21 MED ORDER — FENTANYL CITRATE (PF) 100 MCG/2ML IJ SOLN
INTRAMUSCULAR | Status: DC | PRN
  Administered 2017-07-21: 100 ug via INTRAVENOUS

## 2017-07-21 MED ORDER — LACTATED RINGERS IV SOLN
INTRAVENOUS | Status: DC
  Administered 2017-07-21 (×2): via INTRAVENOUS

## 2017-07-21 MED ORDER — LISINOPRIL 10 MG PO TABS
10.0000 mg | ORAL_TABLET | Freq: Every day | ORAL | Status: DC
  Administered 2017-07-21 – 2017-07-22 (×2): 10 mg via ORAL
  Filled 2017-07-21 (×2): qty 1

## 2017-07-21 MED ORDER — SODIUM CHLORIDE 0.9 % IV BOLUS (SEPSIS)
1000.0000 mL | Freq: Once | INTRAVENOUS | Status: AC
  Administered 2017-07-21: 1000 mL via INTRAVENOUS

## 2017-07-21 MED ORDER — HYDRALAZINE HCL 20 MG/ML IJ SOLN: 10.0000 mg | INTRAMUSCULAR | Status: DC | PRN

## 2017-07-21 MED ORDER — SULFAMETHOXAZOLE-TRIMETHOPRIM 800-160 MG PO TABS
1.0000 | ORAL_TABLET | Freq: Two times a day (BID) | ORAL | Status: DC
  Administered 2017-07-21 – 2017-07-22 (×2): 1 via ORAL
  Filled 2017-07-21 (×2): qty 1

## 2017-07-21 MED ORDER — PANTOPRAZOLE SODIUM 40 MG PO TBEC
40.0000 mg | DELAYED_RELEASE_TABLET | Freq: Every day | ORAL | Status: DC
  Administered 2017-07-21: 40 mg via ORAL
  Filled 2017-07-21: qty 1

## 2017-07-21 MED ORDER — SODIUM CHLORIDE 0.9 % IV SOLN
INTRAVENOUS | Status: DC
  Administered 2017-07-21 – 2017-07-22 (×2): via INTRAVENOUS

## 2017-07-21 MED ORDER — FENTANYL CITRATE (PF) 100 MCG/2ML IJ SOLN
25.0000 ug | INTRAMUSCULAR | Status: DC | PRN
  Administered 2017-07-21: 50 ug via INTRAVENOUS

## 2017-07-21 MED ORDER — LISINOPRIL-HYDROCHLOROTHIAZIDE 10-12.5 MG PO TABS: 1.0000 | ORAL_TABLET | Freq: Every day | ORAL | Status: DC

## 2017-07-21 MED ORDER — DIPHENHYDRAMINE HCL 25 MG PO CAPS: 25.0000 mg | ORAL_CAPSULE | Freq: Four times a day (QID) | ORAL | Status: DC | PRN

## 2017-07-21 MED ORDER — METOPROLOL SUCCINATE ER 25 MG PO TB24
25.0000 mg | ORAL_TABLET | Freq: Every day | ORAL | Status: DC
  Administered 2017-07-22: 25 mg via ORAL
  Filled 2017-07-21 (×2): qty 1

## 2017-07-21 MED ORDER — MIDAZOLAM HCL 2 MG/2ML IJ SOLN: 0.5000 mg | Freq: Once | INTRAMUSCULAR | Status: DC | PRN

## 2017-07-21 MED ORDER — POLYETHYLENE GLYCOL 3350 17 G PO PACK: 17.0000 g | PACK | Freq: Every day | ORAL | Status: DC | PRN

## 2017-07-21 MED ORDER — FENTANYL CITRATE (PF) 100 MCG/2ML IJ SOLN
INTRAMUSCULAR | Status: AC
  Filled 2017-07-21: qty 2

## 2017-07-21 MED ORDER — PROPOFOL 10 MG/ML IV BOLUS
INTRAVENOUS | Status: AC
  Filled 2017-07-21: qty 20

## 2017-07-21 MED ORDER — ACETAMINOPHEN 500 MG PO TABS
1000.0000 mg | ORAL_TABLET | Freq: Four times a day (QID) | ORAL | Status: DC
  Administered 2017-07-21 – 2017-07-22 (×4): 1000 mg via ORAL
  Filled 2017-07-21 (×4): qty 2

## 2017-07-21 MED ORDER — PANTOPRAZOLE SODIUM 40 MG IV SOLR: 40.0000 mg | Freq: Every day | INTRAVENOUS | Status: DC

## 2017-07-21 MED ORDER — AMITRIPTYLINE HCL 50 MG PO TABS
150.0000 mg | ORAL_TABLET | Freq: Every day | ORAL | Status: DC
  Administered 2017-07-21: 150 mg via ORAL
  Filled 2017-07-21: qty 3

## 2017-07-21 MED ORDER — DOCUSATE SODIUM 100 MG PO CAPS
100.0000 mg | ORAL_CAPSULE | Freq: Two times a day (BID) | ORAL | Status: DC
  Administered 2017-07-21 – 2017-07-22 (×3): 100 mg via ORAL
  Filled 2017-07-21 (×3): qty 1

## 2017-07-21 MED ORDER — CIPROFLOXACIN IN D5W 400 MG/200ML IV SOLN: 400.0000 mg | Freq: Once | INTRAVENOUS | Status: DC

## 2017-07-21 MED ORDER — ONDANSETRON HCL 4 MG/2ML IJ SOLN
4.0000 mg | Freq: Once | INTRAMUSCULAR | Status: AC
  Administered 2017-07-21: 4 mg via INTRAVENOUS
  Filled 2017-07-21: qty 2

## 2017-07-21 MED ORDER — PHENYLEPHRINE 40 MCG/ML (10ML) SYRINGE FOR IV PUSH (FOR BLOOD PRESSURE SUPPORT)
PREFILLED_SYRINGE | INTRAVENOUS | Status: AC
  Filled 2017-07-21: qty 10

## 2017-07-21 MED ORDER — METOPROLOL SUCCINATE ER 25 MG PO TB24
ORAL_TABLET | ORAL | Status: AC
  Filled 2017-07-21: qty 1

## 2017-07-21 MED ORDER — 0.9 % SODIUM CHLORIDE (POUR BTL) OPTIME
TOPICAL | Status: DC | PRN
  Administered 2017-07-21: 1000 mL

## 2017-07-21 MED ORDER — MORPHINE SULFATE (PF) 4 MG/ML IV SOLN
4.0000 mg | Freq: Once | INTRAVENOUS | Status: AC
  Administered 2017-07-21: 4 mg via INTRAVENOUS
  Filled 2017-07-21: qty 1

## 2017-07-21 MED ORDER — MIDAZOLAM HCL 2 MG/2ML IJ SOLN
INTRAMUSCULAR | Status: AC
  Filled 2017-07-21: qty 2

## 2017-07-21 MED ORDER — ONDANSETRON HCL 4 MG/2ML IJ SOLN: 4.0000 mg | Freq: Four times a day (QID) | INTRAMUSCULAR | Status: DC | PRN

## 2017-07-21 MED ORDER — ENOXAPARIN SODIUM 40 MG/0.4ML ~~LOC~~ SOLN
40.0000 mg | SUBCUTANEOUS | Status: DC
  Administered 2017-07-22: 40 mg via SUBCUTANEOUS
  Filled 2017-07-21: qty 0.4

## 2017-07-21 MED ORDER — HYDROCHLOROTHIAZIDE 12.5 MG PO CAPS
12.5000 mg | ORAL_CAPSULE | Freq: Every day | ORAL | Status: DC
  Administered 2017-07-21 – 2017-07-22 (×2): 12.5 mg via ORAL
  Filled 2017-07-21 (×2): qty 1

## 2017-07-21 MED ORDER — METOPROLOL SUCCINATE ER 25 MG PO TB24
25.0000 mg | ORAL_TABLET | ORAL | Status: AC
  Administered 2017-07-21: 25 mg via ORAL

## 2017-07-21 MED ORDER — DIPHENHYDRAMINE HCL 50 MG/ML IJ SOLN: 25.0000 mg | Freq: Four times a day (QID) | INTRAMUSCULAR | Status: DC | PRN

## 2017-07-21 MED ORDER — PROMETHAZINE HCL 25 MG/ML IJ SOLN: 6.2500 mg | INTRAMUSCULAR | Status: DC | PRN

## 2017-07-21 MED ORDER — IOPAMIDOL (ISOVUE-300) INJECTION 61%
INTRAVENOUS | Status: AC
  Administered 2017-07-21: 100 mL
  Filled 2017-07-21: qty 100

## 2017-07-21 MED ORDER — PROPOFOL 10 MG/ML IV BOLUS
INTRAVENOUS | Status: DC | PRN
  Administered 2017-07-21: 200 mg via INTRAVENOUS

## 2017-07-21 MED ORDER — MEPERIDINE HCL 50 MG/ML IJ SOLN: 6.2500 mg | INTRAMUSCULAR | Status: DC | PRN

## 2017-07-21 MED ORDER — FENTANYL CITRATE (PF) 250 MCG/5ML IJ SOLN
INTRAMUSCULAR | Status: AC
  Filled 2017-07-21: qty 5

## 2017-07-21 MED ORDER — OXYCODONE HCL 5 MG PO TABS
5.0000 mg | ORAL_TABLET | ORAL | Status: DC | PRN
  Administered 2017-07-21 – 2017-07-22 (×3): 10 mg via ORAL
  Filled 2017-07-21 (×3): qty 2

## 2017-07-21 MED ORDER — INSULIN ASPART 100 UNIT/ML ~~LOC~~ SOLN: 0.0000 [IU] | Freq: Three times a day (TID) | SUBCUTANEOUS | Status: DC

## 2017-07-21 SURGICAL SUPPLY — 27 items
BNDG GAUZE ELAST 4 BULKY (GAUZE/BANDAGES/DRESSINGS) ×3 IMPLANT
CANISTER SUCT 3000ML PPV (MISCELLANEOUS) ×3 IMPLANT
COVER SURGICAL LIGHT HANDLE (MISCELLANEOUS) ×3 IMPLANT
DRAPE LAPAROSCOPIC ABDOMINAL (DRAPES) IMPLANT
DRAPE UTILITY XL STRL (DRAPES) ×3 IMPLANT
DRSG PAD ABDOMINAL 8X10 ST (GAUZE/BANDAGES/DRESSINGS) IMPLANT
ELECT CAUTERY BLADE 6.4 (BLADE) ×3 IMPLANT
ELECT REM PT RETURN 9FT ADLT (ELECTROSURGICAL) ×3
ELECTRODE REM PT RTRN 9FT ADLT (ELECTROSURGICAL) ×1 IMPLANT
GLOVE BIO SURGEON STRL SZ7 (GLOVE) ×3 IMPLANT
GLOVE BIOGEL PI IND STRL 7.0 (GLOVE) ×1 IMPLANT
GLOVE BIOGEL PI IND STRL 7.5 (GLOVE) ×1 IMPLANT
GLOVE BIOGEL PI INDICATOR 7.0 (GLOVE) ×2
GLOVE BIOGEL PI INDICATOR 7.5 (GLOVE) ×2
GOWN STRL REUS W/ TWL LRG LVL3 (GOWN DISPOSABLE) ×3 IMPLANT
GOWN STRL REUS W/TWL LRG LVL3 (GOWN DISPOSABLE) ×9
KIT BASIN OR (CUSTOM PROCEDURE TRAY) ×3 IMPLANT
KIT TURNOVER KIT B (KITS) ×3 IMPLANT
NS IRRIG 1000ML POUR BTL (IV SOLUTION) ×3 IMPLANT
PACK GENERAL/GYN (CUSTOM PROCEDURE TRAY) ×3 IMPLANT
PAD ABD 8X10 STRL (GAUZE/BANDAGES/DRESSINGS) ×3 IMPLANT
PAD ARMBOARD 7.5X6 YLW CONV (MISCELLANEOUS) ×3 IMPLANT
SUT VIC AB 2-0 SH 27 (SUTURE) ×3
SUT VIC AB 2-0 SH 27X BRD (SUTURE) ×1 IMPLANT
SWAB COLLECTION DEVICE MRSA (MISCELLANEOUS) ×3 IMPLANT
SWAB CULTURE ESWAB REG 1ML (MISCELLANEOUS) ×3 IMPLANT
TAPE CLOTH SURG 6X10 WHT LF (GAUZE/BANDAGES/DRESSINGS) ×3 IMPLANT

## 2017-07-21 NOTE — OR Nursing (Signed)
After looking at this OR record there should only be mesh on the right side for this patient ,as the surgeon noted in his op report only the right side had a hernia and placement of mesh was only done on that side.

## 2017-07-21 NOTE — H&P (Signed)
[facility] Surgery Consult/Admission Note  Aaron Kennedy 05/20/1970  [id number].    Requesting MD: Dr. Billy Fischer Chief Complaint/Reason for Consult: left inguinal abscess/fluid collection  HPI:   Pt is a 47 year old male with a history of CVA, HTN, tobacco abuse, S/P b/l inguinal hernia repair 2005, s/p R inguinal hernia repair with mesh and left inguinal exploration by Dr. Rosendo Gros 05/07/17, who presented to ED on 02/15 with abscess and S/P I&D of left inguinal abscess who presented to the ED last night with pain, redness and swelling of left inguinal region. Pt states he was moving brush with his brother yesterday and experienced some pain in his left inguinal region. Last evening the area became large, red and more painful. Pain is worse with movement, non radiating with associated chills. No fever, abdominal pain, nausea, vomiting. Pt is having normal BM's and flatus. No urinary issues. Pt took last dose of Plavix yesterday morning and last PO intake was last night at 2100. WBC 10.4, CT showed Unchanged appearance of bilateral inguinal fluid collections and left greater than right inguinal soft tissue stranding  ROS:  Review of Systems  Constitutional: Positive for chills. Negative for diaphoresis and fever.  HENT: Negative for sore throat.   Respiratory: Negative for cough and shortness of breath.   Cardiovascular: Negative for chest pain.  Gastrointestinal: Negative for abdominal pain, blood in stool, constipation, diarrhea, nausea and vomiting.  Genitourinary: Negative for dysuria.       + for left inguinal inflammation, redness and pain  Skin: Negative for rash.  Neurological: Negative for dizziness and loss of consciousness.  All other systems reviewed and are negative.    Family History  Problem Relation Age of Onset  . Stroke Brother        102s  . Heart attack Sister        62s    Past Medical History:  Diagnosis Date  . Hypertension   . Stroke [facility])      Past Surgical History:  Procedure Laterality Date  . HERNIA REPAIR    . INGUINAL HERNIA REPAIR Bilateral 05/07/2017   Procedure: LAPAROSCOPIC RIGHT  INGUINAL HERNIA REPAIR AND  OPEN LEFT   INGUINAL EXPLORATION;  Surgeon: Ralene Ok, MD;  Location: [place];  Service: General;  Laterality: Bilateral;  . INSERTION OF MESH Bilateral 05/07/2017   Procedure: INSERTION OF MESH;  Surgeon: Ralene Ok, MD;  Location: [place];  Service: General;  Laterality: Bilateral;  . LEG SURGERY      Social History:  reports that he has been smoking cigarettes.  He has a 10.00 pack-year smoking history. He has never used smokeless tobacco. He reports that he has current or past drug history. Drug: Marijuana. He reports that he does not drink alcohol.  Allergies: No Known Allergies   (Not in a hospital admission)  Blood pressure 119/79, pulse 74, temperature 98.1 F (36.7 C), temperature source Oral, resp. rate 16, SpO2 96 %.  Physical Exam  Constitutional: He is oriented to person, place, and time and well-developed, well-nourished, and in no distress. No distress.  HENT:  Head: Normocephalic and atraumatic.  Nose: Nose normal.  Mouth/Throat: Mucous membranes are normal.  Eyes: Pupils are equal, round, and reactive to light. Conjunctivae are normal. Right eye exhibits no discharge. Left eye exhibits no discharge. No scleral icterus.  Neck: Normal range of motion. Neck supple.  Cardiovascular: Normal rate, regular rhythm and normal heart sounds.  No murmur heard. Pulmonary/Chest: Effort normal and breath sounds  normal. No respiratory distress. He has no wheezes. He has no rhonchi. He has no rales.  Abdominal: Soft. Normal appearance and bowel sounds are normal. He exhibits no distension. There is no hepatosplenomegaly. There is no tenderness. There is no rigidity and no guarding.  Large, red, firm area of left inguinal region with scant serous drainage ( see photo below)   Musculoskeletal:  Normal range of motion. He exhibits no edema, tenderness or deformity.  Neurological: He is alert and oriented to person, place, and time.  Skin: Skin is warm and dry. No rash noted. He is not diaphoretic.  Psychiatric: Mood and affect normal.  Nursing note and vitals reviewed.      Results for orders placed or performed during the hospital encounter of 07/20/17 (from the past 48 hour(s))  CBC with Differential     Status: Abnormal   Collection Time: 07/21/17  4:56 AM  Result Value Ref Range   WBC 10.4 4.0 - 10.5 K/uL   RBC 4.17 (L) 4.22 - 5.81 MIL/uL   Hemoglobin 13.5 13.0 - 17.0 g/dL   HCT 40.4 39.0 - 52.0 %   MCV 96.9 78.0 - 100.0 fL   MCH 32.4 26.0 - 34.0 pg   MCHC 33.4 30.0 - 36.0 g/dL   RDW 13.5 11.5 - 15.5 %   Platelets 212 150 - 400 K/uL   Neutrophils Relative % 61 %   Neutro Abs 6.4 1.7 - 7.7 K/uL   Lymphocytes Relative 31 %   Lymphs Abs 3.2 0.7 - 4.0 K/uL   Monocytes Relative 7 %   Monocytes Absolute 0.7 0.1 - 1.0 K/uL   Eosinophils Relative 1 %   Eosinophils Absolute 0.1 0.0 - 0.7 K/uL   Basophils Relative 0 %   Basophils Absolute 0.0 0.0 - 0.1 K/uL    Comment: Performed at [facility], [address] [place], [place] [address]  Comprehensive metabolic panel     Status: Abnormal   Collection Time: 07/21/17  4:56 AM  Result Value Ref Range   Sodium 135 135 - 145 mmol/L   Potassium 3.9 3.5 - 5.1 mmol/L   Chloride 103 101 - 111 mmol/L   CO2 24 22 - 32 mmol/L   Glucose, Bld 91 65 - 99 mg/dL   BUN 8 6 - 20 mg/dL   Creatinine, Ser 0.91 0.61 - 1.24 mg/dL   Calcium 8.2 (L) 8.9 - 10.3 mg/dL   Total Protein 6.1 (L) 6.5 - 8.1 g/dL   Albumin 3.3 (L) 3.5 - 5.0 g/dL   AST 12 (L) 15 - 41 U/L   ALT 18 17 - 63 U/L   Alkaline Phosphatase 115 38 - 126 U/L   Total Bilirubin 0.8 0.3 - 1.2 mg/dL   GFR calc non Af Amer >60 >60 mL/min   GFR calc Af Amer >60 >60 mL/min    Comment: (NOTE) The eGFR has been calculated using the CKD EPI equation. This calculation has not  been validated in all clinical situations. eGFR's persistently <60 mL/min signify possible Chronic Kidney Disease.    Anion gap 8 5 - 15    Comment: Performed at [facility], [address] [place], [place] [address]  Lipase, blood     Status: None   Collection Time: 07/21/17  4:56 AM  Result Value Ref Range   Lipase 26 11 - 51 U/L    Comment: Performed at [facility], [address] [place], [place] [address]  I-Stat CG4   Lactic Acid, ED     Status: None   Collection Time: 07/21/17  5:09 AM  Result Value Ref Range   Lactic Acid, Venous 0.84 0.5 - 1.9 mmol/L  I-Stat CG4 Lactic Acid, ED     Status: Abnormal   Collection Time: 07/21/17  7:38 AM  Result Value Ref Range   Lactic Acid, Venous 0.42 (L) 0.5 - 1.9 mmol/L   Ct Abdomen Pelvis W Contrast  Addendum Date: 07/21/2017   ADDENDUM REPORT: 07/21/2017 06:11 ADDENDUM: Discussion with the referring physician and additional clinical information was obtained. Apparently, the patient had surgical procedure with abscess drainage in the inguinal regions following the comparison CT from 06/03/2017. If this is the case, then the inguinal fluid collections would suggest residual or recurrent abscesses. Electronically Signed   By: Lucienne Capers M.D.   On: 07/21/2017 06:11   Result Date: 07/21/2017 CLINICAL DATA:  Left-sided inguinal hernia after coughing today. History of prior inguinal hernia repair. EXAM: CT ABDOMEN AND PELVIS WITH CONTRAST TECHNIQUE: Multidetector CT imaging of the abdomen and pelvis was performed using the standard protocol following bolus administration of intravenous contrast. CONTRAST:  148m ISOVUE-300 IOPAMIDOL (ISOVUE-300) INJECTION 61% COMPARISON:  06/03/2017 FINDINGS: Lower chest: Atelectasis in the lung bases. Hepatobiliary: No focal liver abnormality is seen. No gallstones, gallbladder wall thickening, or biliary dilatation. Pancreas: Unremarkable. No pancreatic ductal dilatation or surrounding inflammatory  changes. Spleen: Normal in size without focal abnormality. Scattered calcified granulomas. Adrenals/Urinary Tract: Adrenal glands are unremarkable. Kidneys are normal, without renal calculi, focal lesion, or hydronephrosis. Bladder is unremarkable. Stomach/Bowel: Stomach, small bowel, and colon are not abnormally distended and no inflammatory changes are suggested. Appendix is normal. Vascular/Lymphatic: Aortic atherosclerosis. No enlarged abdominal or pelvic lymph nodes. Reproductive: Prostate is unremarkable. Other: Focal loculated fluid collections and adjacent stranding demonstrated in both inguinal regions. Stranding and skin thickening particularly on the left extends to the base of the penis. Surgical clips are present in the area. The appearance is unchanged since previous study and likely represents postoperative changes associated with hernia repair plugs. No definite evidence of any residual or recurrent hernia. Musculoskeletal: No acute or significant osseous findings. IMPRESSION: 1. Unchanged appearance of bilateral inguinal fluid collections and left greater than right inguinal soft tissue stranding. This likely represents postoperative change associated with hernia repair mesh and plugs. No definite evidence of any significant residual or recurrent hernia. 2. Aortic atherosclerosis. Electronically Signed: By: WLucienne CapersM.D. On: 07/21/2017 05:42      Assessment/Plan  Hx of CVA - hold plavix  HTN - home meds postop Prediabetic - hold metformin  Left inguinal abscess/fluid collection - previous abscess 06/03/17 grew Staph aureus sensitive to Cipro and Bactrim  - admit to [facility] - OR for I&D - NPO, IVF, Cipro on call to OR, Bactrim post op  JKalman Drape PDign[facility] Rehabilitation HospitalSurgery 07/21/2017, 8:37 AM Pager: [phone]: [phone]-Fri 7:00 am-4:30 pm Sat-Sun 7:00 am-11:30 am

## 2017-07-21 NOTE — ED Notes (Signed)
Refused to put valuables in security, put in belongings bag instead.

## 2017-07-21 NOTE — Anesthesia Postprocedure Evaluation (Signed)
Anesthesia Post Note  Patient: Aaron Kennedy  Procedure(s) Performed: INCISION AND DRAINAGE INGUINAL HERNIA ABSCESS (Left Groin)     Patient location during evaluation: PACU Anesthesia Type: General Level of consciousness: awake and alert, oriented and patient cooperative Pain management: pain level controlled Vital Signs Assessment: post-procedure vital signs reviewed and stable Respiratory status: spontaneous breathing, nonlabored ventilation and respiratory function stable Cardiovascular status: blood pressure returned to baseline and stable Postop Assessment: no apparent nausea or vomiting Anesthetic complications: no    Last Vitals:  Vitals:   07/21/17 1227 07/21/17 1435  BP: 123/81 102/64  Pulse: 64 67  Resp: 20 16  Temp: (!) 36.4 C 37 C  SpO2: 93% 96%    Last Pain:  Vitals:   07/21/17 1435  TempSrc: Oral  PainSc:                  Aaron Kennedy,E. Christop Hippert

## 2017-07-21 NOTE — Transfer of Care (Signed)
Immediate Anesthesia Transfer of Care Note  Patient: Aaron Kennedy  Procedure(s) Performed: INCISION AND DRAINAGE INGUINAL HERNIA ABSCESS (Left Groin)  Patient Location: PACU  Anesthesia Type:General  Level of Consciousness: awake, alert  and oriented  Airway & Oxygen Therapy: Patient connected to face mask oxygen  Post-op Assessment: Post -op Vital signs reviewed and stable  Post vital signs: stable  Last Vitals:  Vitals Value Taken Time  BP 118/76 07/21/2017 10:53 AM  Temp    Pulse 82 07/21/2017 10:54 AM  Resp 34 07/21/2017 10:54 AM  SpO2 98 % 07/21/2017 10:54 AM  Vitals shown include unvalidated device data.  Last Pain:  Vitals:   07/21/17 0310  TempSrc:   PainSc: 9          Complications: No apparent anesthesia complications

## 2017-07-21 NOTE — Op Note (Signed)
Preoperative diagnosis: left groin abscess, recurrent after groin exploration Postoperative diagnosis: same as above Procedure: Incision and drainage of left groin abscess Surgeon: Dr Harden Mo Anesthesia: general EBL: minimal Specimens skin/soft tissue to path, cultures to micro Complications none Drains none Sponge and needle count correct times two dispo to recovery stable.  Indications: This is a 72 yom with prior left groin exploration after IH repair. He had abscess in February.  This has recurred and I discussed going to the OR and draining.    Procedure: After informed consent was obtained patient was taken to the OR.  Abx were given.  SCDs were in place.  He was then prepped and draped in standard sterile surgical fashion.  Timeout was done.  I excised an ellipse of skin overyling the draining site.  I removed this. There was a pocket of infection that drained purulence. I took cultures.  I then irrigated this. This was packed open and a dressing placed. He tolerated well and was transferred to pacu stable.

## 2017-07-21 NOTE — Anesthesia Preprocedure Evaluation (Addendum)
Anesthesia Evaluation  Patient identified by MRN, date of birth, ID band Patient awake    Reviewed: Allergy & Precautions, NPO status , Patient's Chart, lab work & pertinent test results, reviewed documented beta blocker date and time   History of Anesthesia Complications Negative for: history of anesthetic complications  Airway Mallampati: II  TM Distance: >3 FB Neck ROM: Full    Dental  (+) Teeth Intact, Dental Advisory Given   Pulmonary Current Smoker,    breath sounds clear to auscultation       Cardiovascular hypertension, Pt. on medications and Pt. on home beta blockers (-) angina Rhythm:Regular Rate:Normal  '16 ECHO: EF 40-45%, valves OK   Neuro/Psych Seizures -,  TIACVA    GI/Hepatic negative GI ROS, Neg liver ROS,   Endo/Other  diabetes (glu 91), Oral Hypoglycemic AgentsMorbid obesity  Renal/GU negative Renal ROS     Musculoskeletal   Abdominal   Peds  Hematology negative hematology ROS (+)   Anesthesia Other Findings   Reproductive/Obstetrics                           Anesthesia Physical Anesthesia Plan  ASA: III  Anesthesia Plan: General   Post-op Pain Management:    Induction: Intravenous  PONV Risk Score and Plan: 1 and Ondansetron and Dexamethasone  Airway Management Planned: LMA  Additional Equipment:   Intra-op Plan:   Post-operative Plan:   Informed Consent: I have reviewed the patients History and Physical, chart, labs and discussed the procedure including the risks, benefits and alternatives for the proposed anesthesia with the patient or authorized representative who has indicated his/her understanding and acceptance.   Dental advisory given  Plan Discussed with: CRNA and Surgeon  Anesthesia Plan Comments: (Plan routine monitors, GA- LMA OK)        Anesthesia Quick Evaluation

## 2017-07-21 NOTE — Anesthesia Procedure Notes (Signed)
Procedure Name: LMA Insertion Date/Time: 07/21/2017 10:15 AM Performed by: Dorie Rank, CRNA Pre-anesthesia Checklist: Patient identified, Emergency Drugs available, Suction available, Patient being monitored and Timeout performed Patient Re-evaluated:Patient Re-evaluated prior to induction Oxygen Delivery Method: Circle system utilized Preoxygenation: Pre-oxygenation with 100% oxygen Induction Type: IV induction Ventilation: Mask ventilation without difficulty LMA: LMA inserted LMA Size: 4.0 Number of attempts: 1 Placement Confirmation: breath sounds checked- equal and bilateral and positive ETCO2 Tube secured with: Tape Dental Injury: Teeth and Oropharynx as per pre-operative assessment

## 2017-07-21 NOTE — Progress Notes (Signed)
Patient arrived to 6n26 A&Ox4, VSS, IV intact and infusing.  No family at bedside.  Noted to have guaze dressing present on left lower abdomen/groin area.  Denies pain at this time.  Patient oriented to room and equipment.  Will continue to monitor.

## 2017-07-21 NOTE — Care Management Note (Signed)
Case Management Note  Patient Details  Name: Aaron Kennedy MRN: [id number] Date of Birth: 1970/10/30  Subjective/Objective:                    Action/Plan:  07-21-17 Incision and drainage of left groin abscess  Will continue to follow for discharge needs Expected Discharge Date:                  Expected Discharge Plan:  Home/Self Care  In-House Referral:     Discharge planning Services     Post Acute Care Choice:    Choice offered to:     DME Arranged:    DME Agency:     HH Arranged:    HH Agency:     Status of Service:  In process, will continue to follow  If discussed at Long Length of Stay Meetings, dates discussed:    Additional Comments:  Kingsley Plan, RN 07/21/2017, 1:06 PM

## 2017-07-21 NOTE — ED Notes (Signed)
Patient transported to CT 

## 2017-07-21 NOTE — ED Provider Notes (Signed)
[facility] [address]  SURGICAL Provider Note   CSN: [id number] Arrival date & time: 07/20/17  2318     History   Chief Complaint Chief Complaint  Patient presents with  . Inguinal Hernia    HPI Aaron Kennedy is a 47 y.o. male.  HPI   Per pt January had left inguinal hernia, incarcerated, had hernia repair with Dr. Derrell Lolling February was seen with abscess in area Left side hard, hurting, started today at 3PM, coughed and felt it, couldn't get it to go back in.  Tried to relax but it became bigger and tighter, more redness. Reports it has not drained.  Now pain is 9/10 No nausea or vomiting Passing flatus. Last bm was Monday AM   Past Medical History:  Diagnosis Date  . Hypertension   . Stroke [facility])     Patient Active Problem List   Diagnosis Date Noted  . Soft tissue abscess of inguinal region 07/21/2017  . Abscess 06/04/2017  . Recurrent inguinal hernia 05/07/2017  . S/P hernia repair 05/07/2017  . Chronic pain of right knee 04/22/2016  . PAD (peripheral artery disease) (HCC) 05/02/2015  . Stroke (cerebrum) (HCC) 04/08/2015  . 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 CVA (cerebral vascular accident) (HCC) 04/03/2015  . Seizures (HCC) 04/03/2015  . Stroke (HCC) 03/22/2015  . HTN (hypertension) 03/22/2015  . HLD (hyperlipidemia) 03/22/2015  . Prediabetes 03/22/2015  . Tobacco use disorder 03/22/2015  . [name] (transient ischemic attack) 03/18/2015  . History of fracture of ankle   . Right ankle pain     Past Surgical History:  Procedure Laterality Date  . HERNIA REPAIR    . INGUINAL HERNIA REPAIR Bilateral 05/07/2017   Procedure: LAPAROSCOPIC RIGHT  INGUINAL HERNIA REPAIR AND  OPEN LEFT   INGUINAL EXPLORATION;  Surgeon: Axel Filler, MD;  Location: [facility] OR;  Service: General;  Laterality: Bilateral;  . INSERTION OF MESH Bilateral 05/07/2017   Procedure: INSERTION OF MESH;  Surgeon: Axel Filler, MD;  Location: [facility]  OR;  Service: General;  Laterality: Bilateral;  . LEG SURGERY          Home Medications    Prior to Admission medications   Medication Sig Start Date End Date Taking? Authorizing Provider  amitriptyline (ELAVIL) 50 MG tablet Take 3 tablets (150 mg total) by mouth at bedtime. 02/11/17  Yes Massie Maroon, FNP  atorvastatin (LIPITOR) 80 MG tablet TAKE 1 TABLET BY MOUTH DAILY AT 6 PM. 12/30/16  Yes Bing Neighbors, FNP  clopidogrel (PLAVIX) 75 MG tablet Take 1 tablet (75 mg total) by mouth daily. 02/11/17  Yes Massie Maroon, FNP  gabapentin (NEURONTIN) 600 MG tablet TAKE 2 TABLETS BY MOUTH DAILY. 04/28/17  Yes Massie Maroon, FNP  lisinopril-hydrochlorothiazide (PRINZIDE,ZESTORETIC) 10-12.5 MG tablet TAKE 1 TABLET BY MOUTH DAILY. 07/20/17  Yes Massie Maroon, FNP  metFORMIN (GLUCOPHAGE) 500 MG tablet Take 1 tablet (500 mg total) by mouth 2 (two) times daily with a meal. 12/30/16  Yes Bing Neighbors, FNP  metoprolol succinate (TOPROL-XL) 25 MG 24 hr tablet TAKE 1 TABLET BY MOUTH DAILY. 02/11/17  Yes Massie Maroon, FNP  saccharomyces boulardii (FLORASTOR) 250 MG capsule Take 1 capsule (250 mg total) by mouth 2 (two) times daily. 06/04/17  Yes Romie Levee, MD  amitriptyline (ELAVIL) 50 MG tablet TAKE THREE TABLETS BY MOUTH AT BEDTIME. Patient not taking: Reported on 07/21/2017 07/20/17   Massie Maroon, FNP  lisinopril-hydrochlorothiazide (PRINZIDE,ZESTORETIC) 10-12.5  MG tablet Take 1 tablet by mouth daily. Patient not taking: Reported on 07/21/2017 02/11/17   Massie Maroon, FNP  mupirocin ointment (BACTROBAN) 2 % Apply 1 application topically 3 (three) times daily. Patient not taking: Reported on 07/21/2017 12/24/16   Bing Neighbors, FNP  oxyCODONE (OXY IR/ROXICODONE) 5 MG immediate release tablet Take 1 tablet (5 mg total) by mouth every 4 (four) hours as needed for severe pain. Patient not taking: Reported on 07/21/2017 06/04/17   Romie Levee, MD    Family  History Family History  Problem Relation Age of Onset  . Stroke Brother        23s  . Heart attack Sister        28s    Social History Social History   Tobacco Use  . Smoking status: Current Every Day Smoker    Packs/day: 0.50    Years: 20.00    Pack years: 10.00    Types: Cigarettes  . Smokeless tobacco: Never Used  Substance Use Topics  . Alcohol use: No  . Drug use: Yes    Types: Marijuana     Allergies   Patient has no known allergies.   Review of Systems Review of Systems  Constitutional: Positive for chills. Negative for fever.  HENT: Negative for sore throat.   Eyes: Negative for visual disturbance.  Respiratory: Negative for shortness of breath.   Cardiovascular: Negative for chest pain.  Gastrointestinal: Positive for abdominal pain and constipation. Negative for diarrhea, nausea and vomiting.  Genitourinary: Negative for difficulty urinating.  Musculoskeletal: Negative for back pain and neck stiffness.  Skin: Negative for rash.  Neurological: Negative for syncope and headaches.     Physical Exam Updated Vital Signs BP 102/64 (BP Location: Left Arm)   Pulse 67   Temp 98.6 F (37 C) (Oral)   Resp 16   SpO2 96%   Physical Exam  Constitutional: He is oriented to person, place, and time. He appears well-developed and well-nourished. No distress.  HENT:  Head: Normocephalic and atraumatic.  Eyes: Conjunctivae and EOM are normal.  Neck: Normal range of motion.  Cardiovascular: Normal rate, regular rhythm, normal heart sounds and intact distal pulses. Exam reveals no gallop and no friction rub.  No murmur heard. Pulmonary/Chest: Effort normal and breath sounds normal. No respiratory distress. He has no wheezes. He has no rales.  Abdominal: Soft. He exhibits no distension. There is no tenderness. There is no guarding.  Genitourinary:  Genitourinary Comments: No hernia palpated through canal/scrotum Left inguinal area with swelling erythema induration  and tendderness  Musculoskeletal: He exhibits no edema.  Neurological: He is alert and oriented to person, place, and time.  Skin: Skin is warm and dry. He is not diaphoretic.  Nursing note and vitals reviewed.    ED Treatments / Results  Labs (all labs ordered are listed, but only abnormal results are displayed) Labs Reviewed  CBC WITH DIFFERENTIAL/PLATELET - Abnormal; Notable for the following components:      Result Value   RBC 4.17 (*)    All other components within normal limits  COMPREHENSIVE METABOLIC PANEL - Abnormal; Notable for the following components:   Calcium 8.2 (*)    Total Protein 6.1 (*)    Albumin 3.3 (*)    AST 12 (*)    All other components within normal limits  GLUCOSE, CAPILLARY - Abnormal; Notable for the following components:   Glucose-Capillary 114 (*)    All other components within normal limits  GLUCOSE, CAPILLARY -  Abnormal; Notable for the following components:   Glucose-Capillary 106 (*)    All other components within normal limits  I-STAT CG4 LACTIC ACID, ED - Abnormal; Notable for the following components:   Lactic Acid, Venous 0.42 (*)    All other components within normal limits  AEROBIC/ANAEROBIC CULTURE (SURGICAL/DEEP WOUND)  LIPASE, BLOOD  BASIC METABOLIC PANEL  CBC  I-STAT CG4 LACTIC ACID, ED  SURGICAL PATHOLOGY    EKG None  Radiology Ct Abdomen Pelvis W Contrast  Addendum Date: 07/21/2017   ADDENDUM REPORT: 07/21/2017 06:11 ADDENDUM: Discussion with the referring physician and additional clinical information was obtained. Apparently, the patient had surgical procedure with abscess drainage in the inguinal regions following the comparison CT from 06/03/2017. If this is the case, then the inguinal fluid collections would suggest residual or recurrent abscesses. Electronically Signed   By: Burman Nieves M.D.   On: 07/21/2017 06:11   Result Date: 07/21/2017 CLINICAL DATA:  Left-sided inguinal hernia after coughing today. History of  prior inguinal hernia repair. EXAM: CT ABDOMEN AND PELVIS WITH CONTRAST TECHNIQUE: Multidetector CT imaging of the abdomen and pelvis was performed using the standard protocol following bolus administration of intravenous contrast. CONTRAST:  ISOVUE-300 IOPAMIDOL (ISOVUE-300) INJECTION 61% COMPARISON:  06/03/2017 FINDINGS: Lower chest: Atelectasis in the lung bases. Hepatobiliary: No focal liver abnormality is seen. No gallstones, gallbladder wall thickening, or biliary dilatation. Pancreas: Unremarkable. No pancreatic ductal dilatation or surrounding inflammatory changes. Spleen: Normal in size without focal abnormality. Scattered calcified granulomas. Adrenals/Urinary Tract: Adrenal glands are unremarkable. Kidneys are normal, without renal calculi, focal lesion, or hydronephrosis. Bladder is unremarkable. Stomach/Bowel: Stomach, small bowel, and colon are not abnormally distended and no inflammatory changes are suggested. Appendix is normal. Vascular/Lymphatic: Aortic atherosclerosis. No enlarged abdominal or pelvic lymph nodes. Reproductive: Prostate is unremarkable. Other: Focal loculated fluid collections and adjacent stranding demonstrated in both inguinal regions. Stranding and skin thickening particularly on the left extends to the base of the penis. Surgical clips are present in the area. The appearance is unchanged since previous study and likely represents postoperative changes associated with hernia repair plugs. No definite evidence of any residual or recurrent hernia. Musculoskeletal: No acute or significant osseous findings. IMPRESSION: 1. Unchanged appearance of bilateral inguinal fluid collections and left greater than right inguinal soft tissue stranding. This likely represents postoperative change associated with hernia repair mesh and plugs. No definite evidence of any significant residual or recurrent hernia. 2. Aortic atherosclerosis. Electronically Signed: By: Burman Nieves M.D. On:  07/21/2017 05:42    Procedures Procedures (including critical care time)  Medications Ordered in ED Medications  enoxaparin (LOVENOX) injection 40 mg (has no administration in time range)  0.9 %  sodium chloride infusion ( Intravenous New Bag/Given 07/21/17 1153)  acetaminophen (TYLENOL) tablet 1,000 mg (1,000 mg Oral Given 07/21/17 1735)  oxyCODONE (Oxy IR/ROXICODONE) immediate release tablet 5-10 mg (10 mg Oral Given 07/21/17 1739)  morphine 4 MG/ML injection 2 mg (has no administration in time range)  diphenhydrAMINE (BENADRYL) capsule 25 mg (has no administration in time range)    Or  diphenhydrAMINE (BENADRYL) injection 25 mg (has no administration in time range)  docusate sodium (COLACE) capsule 100 mg (100 mg Oral Given 07/21/17 1340)  polyethylene glycol (MIRALAX / GLYCOLAX) packet 17 g (has no administration in time range)  ondansetron (ZOFRAN-ODT) disintegrating tablet 4 mg (has no administration in time range)    Or  ondansetron (ZOFRAN) injection 4 mg (has no administration in time range)  hydrALAZINE (APRESOLINE)  injection 10 mg (has no administration in time range)  metoprolol succinate (TOPROL-XL) 25 MG 24 hr tablet (has no administration in time range)  amitriptyline (ELAVIL) tablet 150 mg (has no administration in time range)  clopidogrel (PLAVIX) tablet 75 mg (75 mg Oral Given 07/21/17 1340)  gabapentin (NEURONTIN) tablet 1,200 mg (1,200 mg Oral Given 07/21/17 1340)  metoprolol succinate (TOPROL-XL) 24 hr tablet 25 mg (25 mg Oral Not Given 07/21/17 1341)  insulin aspart (novoLOG) injection 0-15 Units (0 Units Subcutaneous Not Given 07/21/17 1730)  sulfamethoxazole-trimethoprim (BACTRIM DS,SEPTRA DS) 800-160 MG per tablet 1 tablet (1 tablet Oral Given 07/21/17 1735)  fentaNYL (SUBLIMAZE) 100 MCG/2ML injection (has no administration in time range)  lisinopril (PRINIVIL,ZESTRIL) tablet 10 mg (10 mg Oral Given 07/21/17 1340)    And  hydrochlorothiazide (MICROZIDE) capsule 12.5 mg (12.5 mg  Oral Given 07/21/17 1341)  pantoprazole (PROTONIX) EC tablet 40 mg (has no administration in time range)  sodium chloride 0.9 % bolus 1,000 mL (0 mLs Intravenous Stopped 07/21/17 0719)    Followed by  sodium chloride 0.9 % bolus 1,000 mL (0 mLs Intravenous Stopped 07/21/17 0719)  morphine 4 MG/ML injection 4 mg (4 mg Intravenous Given 07/21/17 0449)  ondansetron (ZOFRAN) injection 4 mg (4 mg Intravenous Given 07/21/17 0449)  iopamidol (ISOVUE-300) 61 % injection (100 mLs  Contrast Given 07/21/17 0505)  ciprofloxacin (CIPRO) IVPB 400 mg ( Intravenous MAR Unhold 07/21/17 1214)  ciprofloxacin (CIPRO) 400 MG/200ML IVPB (  Override pull for Anesthesia 07/21/17 1029)  metoprolol succinate (TOPROL-XL) 24 hr tablet 25 mg (25 mg Oral Given 07/21/17 1006)     Initial Impression / Assessment and Plan / ED Course  I have reviewed the triage vital signs and the nursing notes.  Pertinent labs & imaging results that were available during my care of the patient were reviewed by me and considered in my medical decision making (see chart for details).     47yo male presents with concern for left inguinal pain and swelling.  Hx of prior bilateral hernia repairs, exploration, and had abscess in February which was drained by surgery and patient admitted.  Exam concerning for cellulitis, r/o abscess or incarcerated hernia.  CT shows bilateral fluid collections, unchanged from prior, however given patient had previous drainage of fluid, and now has new increasing pain and redness to the area, suspect recurrent abscess. Consulted General Surgery who will take the patient to the OR for drainage.  Final Clinical Impressions(s) / ED Diagnoses   Final diagnoses:  Inguinal abscess    ED Discharge Orders    None       Alvira Monday, MD 07/21/17 Paulo Fruit

## 2017-07-22 ENCOUNTER — Encounter (HOSPITAL_COMMUNITY): Payer: Self-pay | Admitting: General Surgery

## 2017-07-22 LAB — CBC
HCT: 40.7 % (ref 39.0–52.0)
Hemoglobin: 13.5 g/dL (ref 13.0–17.0)
MCH: 32.4 pg (ref 26.0–34.0)
MCHC: 33.2 g/dL (ref 30.0–36.0)
MCV: 97.6 fL (ref 78.0–100.0)
Platelets: 196 10*3/uL (ref 150–400)
RBC: 4.17 MIL/uL — AB (ref 4.22–5.81)
RDW: 13.5 % (ref 11.5–15.5)
WBC: 6.7 10*3/uL (ref 4.0–10.5)

## 2017-07-22 LAB — BASIC METABOLIC PANEL
ANION GAP: 4 — AB (ref 5–15)
BUN: 6 mg/dL (ref 6–20)
CALCIUM: 7.8 mg/dL — AB (ref 8.9–10.3)
CO2: 22 mmol/L (ref 22–32)
Chloride: 109 mmol/L (ref 101–111)
Creatinine, Ser: 0.87 mg/dL (ref 0.61–1.24)
Glucose, Bld: 98 mg/dL (ref 65–99)
POTASSIUM: 4.2 mmol/L (ref 3.5–5.1)
Sodium: 135 mmol/L (ref 135–145)

## 2017-07-22 LAB — GLUCOSE, CAPILLARY: GLUCOSE-CAPILLARY: 88 mg/dL (ref 65–99)

## 2017-07-22 MED ORDER — SULFAMETHOXAZOLE-TRIMETHOPRIM 800-160 MG PO TABS: 1.0000 | ORAL_TABLET | Freq: Two times a day (BID) | ORAL | 0 refills | Status: DC

## 2017-07-22 MED ORDER — OXYCODONE HCL 5 MG PO TABS: 5.0000 mg | ORAL_TABLET | Freq: Four times a day (QID) | ORAL | 0 refills | Status: DC | PRN

## 2017-07-22 MED FILL — SULFAMETHOXAZOLE-TMP DS TAB: 800-160 | 7 days supply | Qty: 14 | Fill #0

## 2017-07-22 MED FILL — oxyCODONE HCL 5 MG TABS: 5 | 4 days supply | Qty: 15 | Fill #0

## 2017-07-22 NOTE — Discharge Summary (Signed)
[facility] Surgery/Trauma Discharge Summary   Patient ID: Aaron Kennedy MRN: [id number] DOB/AGE: 01-03-1971 47 y.o.  Admit date: 07/20/2017 Discharge date: 07/22/2017  Admitting Diagnosis: Left inguinal abscess  Discharge Diagnosis Patient Active Problem List   Diagnosis Date Noted  . Soft tissue abscess of inguinal region 07/21/2017  . Abscess 06/04/2017  . Recurrent inguinal hernia 05/07/2017  . S/P hernia repair 05/07/2017  . Chronic pain of right knee 04/22/2016  . PAD (peripheral artery disease) (HCC) 05/02/2015  . Stroke (cerebrum) (HCC) 04/08/2015  . 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 CVA (cerebral vascular accident) (HCC) 04/03/2015  . Seizures (HCC) 04/03/2015  . Stroke (HCC) 03/22/2015  . HTN (hypertension) 03/22/2015  . HLD (hyperlipidemia) 03/22/2015  . Prediabetes 03/22/2015  . Tobacco use disorder 03/22/2015  . [name] (transient ischemic attack) 03/18/2015  . History of fracture of ankle   . Right ankle pain     Consultants none  Imaging: Ct Abdomen Pelvis W Contrast  Addendum Date: 07/21/2017   ADDENDUM REPORT: 07/21/2017 06:11 ADDENDUM: Discussion with the referring physician and additional clinical information was obtained. Apparently, the patient had surgical procedure with abscess drainage in the inguinal regions following the comparison CT from 06/03/2017. If this is the case, then the inguinal fluid collections would suggest residual or recurrent abscesses. Electronically Signed   By: Burman Nieves M.D.   On: 07/21/2017 06:11   Result Date: 07/21/2017 CLINICAL DATA:  Left-sided inguinal hernia after coughing today. History of prior inguinal hernia repair. EXAM: CT ABDOMEN AND PELVIS WITH CONTRAST TECHNIQUE: Multidetector CT imaging of the abdomen and pelvis was performed using the standard protocol following bolus administration of intravenous contrast. CONTRAST:  ISOVUE-300 IOPAMIDOL (ISOVUE-300) INJECTION 61%  COMPARISON:  06/03/2017 FINDINGS: Lower chest: Atelectasis in the lung bases. Hepatobiliary: No focal liver abnormality is seen. No gallstones, gallbladder wall thickening, or biliary dilatation. Pancreas: Unremarkable. No pancreatic ductal dilatation or surrounding inflammatory changes. Spleen: Normal in size without focal abnormality. Scattered calcified granulomas. Adrenals/Urinary Tract: Adrenal glands are unremarkable. Kidneys are normal, without renal calculi, focal lesion, or hydronephrosis. Bladder is unremarkable. Stomach/Bowel: Stomach, small bowel, and colon are not abnormally distended and no inflammatory changes are suggested. Appendix is normal. Vascular/Lymphatic: Aortic atherosclerosis. No enlarged abdominal or pelvic lymph nodes. Reproductive: Prostate is unremarkable. Other: Focal loculated fluid collections and adjacent stranding demonstrated in both inguinal regions. Stranding and skin thickening particularly on the left extends to the base of the penis. Surgical clips are present in the area. The appearance is unchanged since previous study and likely represents postoperative changes associated with hernia repair plugs. No definite evidence of any residual or recurrent hernia. Musculoskeletal: No acute or significant osseous findings. IMPRESSION: 1. Unchanged appearance of bilateral inguinal fluid collections and left greater than right inguinal soft tissue stranding. This likely represents postoperative change associated with hernia repair mesh and plugs. No definite evidence of any significant residual or recurrent hernia. 2. Aortic atherosclerosis. Electronically Signed: By: Burman Nieves M.D. On: 07/21/2017 05:42    Procedures Dr. Dwain Sarna (07/21/17) - Incision and drainage of left groin abscess  HPI: Pt is a 47 year old male with a history of CVA, HTN, tobacco abuse, S/P b/l inguinal hernia repair 2005, s/p R inguinal hernia repair with mesh and left inguinal exploration by Dr.  Derrell Lolling 05/07/17, who presented to ED on 02/15 with abscess and S/P I&D of left inguinal abscess who presented to the ED last night with pain, redness and swelling of left  inguinal region. Pt states he was moving brush with his brother yesterday and experienced some pain in his left inguinal region. Last evening the area became large, red and more painful. Pain is worse with movement, non radiating with associated chills. No fever, abdominal pain, nausea, vomiting. Pt is having normal BM's and flatus. No urinary issues. Pt took last dose of Plavix yesterday morning and last PO intake was last night at 2100. WBC 10.4, CT showed Unchanged appearance of bilateral inguinal fluid collections and left greater than right inguinal soft tissue stranding  Hospital Course:  Workup showed left groin abscess.  Patient was admitted and underwent procedure listed above.  Tolerated procedure well and was transferred to the floor.  Diet was advanced as tolerated.  On POD#1, the patient was voiding well, tolerating diet, ambulating well, pain well controlled, vital signs stable, incisions c/d/i and felt stable for discharge home.  Patient will follow up in our office in 2 weeks and knows to call with questions or concerns.  He will call to confirm appointment date/time.    Patient was discharged in good condition.  The [place] Substance controlled database was reviewed prior to prescribing narcotic pain medication to this patient.  Physical Exam: General:  Alert, NAD, pleasant, cooperative Cardio: RRR, S1 & S2 normal, no murmur, rubs, gallops Resp: Effort normal, lungs CTA bilaterally, no wheezes, rales, rhonchi Abd:  Soft, ND, normal bowel sounds, no tenderness, left groin abscess site C/D/I, packing had just been changed by Dr. Dwain Sarna Skin: warm and dry  Allergies as of 07/22/2017   No Known Allergies     Medication List    STOP taking these medications   oxyCODONE 5 MG immediate release  tablet Commonly known as:  Oxy IR/ROXICODONE     TAKE these medications   amitriptyline 50 MG tablet Commonly known as:  ELAVIL Take 3 tablets (150 mg total) by mouth at bedtime. What changed:  Another medication with the same name was removed. Continue taking this medication, and follow the directions you see here.   atorvastatin 80 MG tablet Commonly known as:  LIPITOR TAKE 1 TABLET BY MOUTH DAILY AT 6 PM.   clopidogrel 75 MG tablet Commonly known as:  PLAVIX Take 1 tablet (75 mg total) by mouth daily.   gabapentin 600 MG tablet Commonly known as:  NEURONTIN TAKE 2 TABLETS BY MOUTH DAILY.   lisinopril-hydrochlorothiazide 10-12.5 MG tablet Commonly known as:  PRINZIDE,ZESTORETIC TAKE 1 TABLET BY MOUTH DAILY. What changed:  Another medication with the same name was removed. Continue taking this medication, and follow the directions you see here.   metFORMIN 500 MG tablet Commonly known as:  GLUCOPHAGE Take 1 tablet (500 mg total) by mouth 2 (two) times daily with a meal.   metoprolol succinate 25 MG 24 hr tablet Commonly known as:  TOPROL-XL TAKE 1 TABLET BY MOUTH DAILY.   mupirocin ointment 2 % Commonly known as:  BACTROBAN Apply 1 application topically 3 (three) times daily.   saccharomyces boulardii 250 MG capsule Commonly known as:  FLORASTOR Take 1 capsule (250 mg total) by mouth 2 (two) times daily.   sulfamethoxazole-trimethoprim 800-160 MG tablet Commonly known as:  BACTRIM DS,SEPTRA DS Take 1 tablet by mouth every 12 (twelve) hours.        Follow-up Information    Axel Filler, MD. Call.   Specialty:  General Surgery Why:  we are working on a follow up appointment for you. Please call to see when your appointment is.  Contact information: [address] [place] [address] [phone]           Signed: Joyce Copa [facility] Surgery 07/22/2017, 10:55 AM Pager: [phone] Consults: [phone] Mon-Fri 7:00  am-4:30 pm Sat-Sun 7:00 am-11:30 am

## 2017-07-22 NOTE — Discharge Instructions (Signed)
Incision and Drainage, Care After Refer to this sheet in the next few weeks. These instructions provide you with information about caring for yourself after your procedure. Your health care provider may also give you more specific instructions. Your treatment has been planned according to current medical practices, but problems sometimes occur. Call your health care provider if you have any problems or questions after your procedure. What can I expect after the procedure? After the procedure, it is common to have:  Pain or discomfort around your incision site.  Drainage from your incision.  Follow these instructions at home:  Take over-the-counter and prescription medicines only as told by your health care provider.  If you were prescribed an antibiotic medicine, take it as told by your health care provider.Do not stop taking the antibiotic even if you start to feel better.  Followinstructions from your health care provider about: ? How to take care of your incision. ? When and how you should change your packing and bandage (dressing). Wash your hands with soap and water before you change your dressing. If soap and water are not available, use hand sanitizer. ? When you should remove your dressing.  Do not take baths, swim, or use a hot tub until your health care provider approves.  Keep all follow-up visits as told by your health care provider. This is important.  Check your incision area every day for signs of infection. Check for: ? More redness, swelling, or pain. ? More fluid or blood. ? Warmth. ? Pus or a bad smell. Contact a health care provider if:  Your cyst or abscess returns.  You have a fever.  You have more redness, swelling, or pain around your incision.  You have more fluid or blood coming from your incision.  Your incision feels warm to the touch.  You have pus or a bad smell coming from your incision. Get help right away if:  You have severe pain or  bleeding.  You cannot eat or drink without vomiting.  You have decreased urine output.  You become short of breath.  You have chest pain.  You cough up blood.  The area where the incision and drainage occurred becomes numb or it tingles. This information is not intended to replace advice given to you by your health care provider. Make sure you discuss any questions you have with your health care provider. Document Released: 06/28/2011 Document Revised: 09/05/2015 Document Reviewed: 01/24/2015 Elsevier Interactive Patient Education  2018 Elsevier Inc.   WOUND CARE: - dressing to be changed twice daily - supplies: sterile saline, kerlix, scissors, ABD pads, tape  - remove dressing and all packing carefully, moistening with sterile saline as needed to avoid packing/internal dressing sticking to the wound. - clean edges of skin around the wound with water/gauze, making sure there is no tape debris or leakage left on skin that could cause skin irritation or breakdown. - dampen and clean kerlix with sterile saline and pack wound from wound base to skin level, making sure to take note of any possible areas of wound tracking, tunneling and packing appropriately. Wound can be packed loosely. Trim kerlix to size if a whole kerlix is not required. - cover wound with a dry ABD pad and secure with tape.  - write the date/time on the dry dressing/tape to better track when the last dressing change occurred. - apply any skin protectant/powder recommended by clinician to protect skin/skin folds. - change dressing as needed if leakage occurs, wound gets contaminated,  or patient requests to shower. - patient may shower daily with wound open and following the shower the wound should be dried and a clean dressing placed.     1. PAIN CONTROL:  1. Pain is best controlled by a usual combination of three different methods TOGETHER:  1. Ice/Heat 2. Over the counter pain medication 3. Prescription pain  medication 2. Most patients will experience some swelling and bruising around wounds. Ice packs or heating pads (30-60 minutes up to 6 times a day) will help. Use ice for the first few days to help decrease swelling and bruising, then switch to heat to help relax tight/sore spots and speed recovery. Some people prefer to use ice alone, heat alone, alternating between ice & heat. Experiment to what works for you. Swelling and bruising can take several weeks to resolve.  3. It is helpful to take an over-the-counter pain medication regularly for the first few weeks. Choose one of the following that works best for you:  1. Naproxen (Aleve, etc) Two 220mg  tabs twice a day 2. Ibuprofen (Advil, etc) Three 200mg  tabs four times a day (every meal & bedtime) 3. Acetaminophen (Tylenol, etc) 500-650mg  four times a day (every meal & bedtime) 4. A prescription for pain medication (such as oxycodone, hydrocodone, etc) should be given to you upon discharge. Take your pain medication as prescribed.  1. If you are having problems/concerns with the prescription medicine (does not control pain, nausea, vomiting, rash, itching, etc), please call us [phone] to see if we need to switch you to a different pain medicine that will work better for you and/or control your side effect better. 2. If you need a refill on your pain medication, please contact your pharmacy. They will contact our office to request authorization. Prescriptions will not be filled after 5 pm or on week-ends. 4. Avoid getting constipated. When taking pain medications, it is common to experience some constipation. Increasing fluid intake and taking a fiber supplement (such as Metamucil, Citrucel, FiberCon, MiraLax, etc) 1-2 times a day regularly will usually help prevent this problem from occurring. A mild laxative (prune juice, Milk of Magnesia, MiraLax, etc) should be taken according to package directions if there are no bowel movements after 48 hours.    5. Watch out for diarrhea. If you have many loose bowel movements, simplify your diet to bland foods & liquids for a few days. Stop any stool softeners and decrease your fiber supplement. Switching to mild anti-diarrheal medications (Kayopectate, Pepto Bismol) can help. If this worsens or does not improve, please call us. 6. Wash / shower every day. You may shower daily and replace your bandges after showering. No bathing or submerging your wounds in water until they heal. 7. FOLLOW UP in our office  1. Please call [facility] at [phone] to set up an appointment for a follow-up appointment approximately 2-3 weeks after discharge for wound check  WHEN TO CALL us [phone]:  1. Poor pain control 2. Reactions / problems with new medications (rash/itching, nausea, etc)  3. Fever over 101.5 F (38.5 C) 4. Worsening swelling or bruising 5. Continued bleeding from wounds. 6. Increased pain, redness, or drainage from the wounds which could be signs of infection  The clinic staff is available to answer your questions during regular business hours (8:30am-5pm). Please dont hesitate to call and ask to speak to one of our nurses for clinical concerns.  If you have a medical emergency, go to the nearest emergency room or  call 911.  A surgeon from [facility] is always on call at the [facility], [place]  [address], [address], [place], [place] [address] ?  MAIN: [phone] ? TOLL FREE: [phone] ?  FAX [phone]  [email or web address]

## 2017-07-22 NOTE — Progress Notes (Signed)
[name] to be D/C'd  per MD order. Discussed with the patient and all questions fully answered.  VSS, Skin clean, dry and intact without evidence of skin break down, no evidence of skin tears noted.  IV catheter discontinued intact. Site without signs and symptoms of complications. Dressing and pressure applied.  An After Visit Summary was printed and given to the patient. Patient received prescription.  D/c education completed with patient/family including follow up instructions, medication list, d/c activities limitations if indicated, with other d/c instructions as indicated by MD - patient able to verbalize understanding, all questions fully answered.   Patient instructed to return to ED, call 911, or call MD for any changes in condition.   Patient to be D/C home via private auto.

## 2017-07-26 LAB — AEROBIC/ANAEROBIC CULTURE W GRAM STAIN (SURGICAL/DEEP WOUND)

## 2017-09-23 ENCOUNTER — Ambulatory Visit: Payer: Self-pay | Admitting: Family Medicine

## 2017-09-26 ENCOUNTER — Other Ambulatory Visit: Payer: Self-pay | Admitting: Family Medicine

## 2017-09-26 MED FILL — LISINOPRIL-HCTZ 10-12.5 MG: 10-12.5 | 30 days supply | Qty: 30 | Fill #1

## 2017-09-26 MED FILL — AMITRIPTYLINE HCL 50 MG TAB: 50 | 30 days supply | Qty: 90 | Fill #0

## 2017-09-26 MED FILL — METOPROLOL SUCCINATE ER 25: 25 | 30 days supply | Qty: 30 | Fill #5

## 2017-09-26 MED FILL — CLOPIDOGREL 75 MG TABLET: 75 | 30 days supply | Qty: 30 | Fill #3

## 2017-09-26 MED FILL — metFORMIN HCL 500 MG TABS: 500 | 30 days supply | Qty: 60 | Fill #5

## 2017-09-26 MED FILL — GABAPENTIN 600 MG TABLET: 600 | 30 days supply | Qty: 60 | Fill #2

## 2017-09-26 MED FILL — ATORVASTATIN 80 MG TABLET: 80 | 30 days supply | Qty: 30 | Fill #3

## 2017-11-10 MED FILL — LISINOPRIL-HCTZ 10-12.5 MG: 10-12.5 | 30 days supply | Qty: 30 | Fill #2

## 2017-11-10 MED FILL — CLOPIDOGREL 75 MG TABLET: 75 | 30 days supply | Qty: 30 | Fill #4

## 2017-11-10 MED FILL — metFORMIN HCL 500 MG TABS: 500 | 30 days supply | Qty: 60 | Fill #6

## 2017-11-11 MED FILL — METOPROLOL SUCCINATE ER 25: 25 | 30 days supply | Qty: 30 | Fill #0

## 2017-12-26 MED FILL — METOPROLOL SUCCINATE ER 25: 25 | 30 days supply | Qty: 30 | Fill #1

## 2017-12-26 MED FILL — LISINOPRIL-HCTZ 10-12.5 MG: 10-12.5 | 30 days supply | Qty: 30 | Fill #3

## 2017-12-26 MED FILL — GABAPENTIN 600 MG TABLET: 600 | 30 days supply | Qty: 60 | Fill #3

## 2018-01-16 ENCOUNTER — Encounter (HOSPITAL_COMMUNITY): Payer: Self-pay | Admitting: Emergency Medicine

## 2018-01-16 ENCOUNTER — Ambulatory Visit (HOSPITAL_COMMUNITY)
Admission: EM | Admit: 2018-01-16 | Discharge: 2018-01-16 | Disposition: A | Discharge status: Home / Self Care | Payer: No Typology Code available for payment source | Attending: Family Medicine | Admitting: Family Medicine

## 2018-01-16 DIAGNOSIS — S0501XA Injury of conjunctiva and corneal abrasion without foreign body, right eye, initial encounter: Secondary | ICD-10-CM

## 2018-01-16 MED ORDER — POLYMYXIN B-TRIMETHOPRIM 10000-0.1 UNIT/ML-% OP SOLN: 1.0000 [drp] | OPHTHALMIC | 0 refills | Status: DC

## 2018-01-16 NOTE — Discharge Instructions (Addendum)
We will give you some antibiotic eyedrops for the right eye Use this every 4 hours while awake Follow-up for continued or worsening symptoms.

## 2018-01-16 NOTE — ED Triage Notes (Signed)
Pt c/o R eye redness and drainage, states he felt something in it a few days ago, now that feeling is gone but its still red and draining.

## 2018-01-16 NOTE — ED Provider Notes (Signed)
[facility]    CSN: [id number] Arrival date & time: 01/16/18  1205     History   Chief Complaint No chief complaint on file.   HPI Aaron Kennedy is a 47 y.o. male.   Patient is a 47 year old male with past medical history of hypertension, stroke, MRSA, tobacco abuse.  He presents with right eye redness, drainage, irritation since Thursday.  This started after something got in his eye at work. He works around a lot of dust. He was able to get the foreign body sensation to subside but since has had redness, drainage to the eye and upper lid swelling. He denies any vision changes, seeing spots, loss of vision. He denies any pain to the eye. He has been using a cool wash cloth for symptoms. He denies any dizziness, headaches.   ROS per HPI      Past Medical History:  Diagnosis Date  . Hypertension   . Stroke [facility])     Patient Active Problem List   Diagnosis Date Noted  . Soft tissue abscess of inguinal region 07/21/2017  . Abscess 06/04/2017  . Recurrent inguinal hernia 05/07/2017  . S/P hernia repair 05/07/2017  . Chronic pain of right knee 04/22/2016  . PAD (peripheral artery disease) (HCC) 05/02/2015  . Stroke (cerebrum) (HCC) 04/08/2015  . 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 CVA (cerebral vascular accident) (HCC) 04/03/2015  . Seizures (HCC) 04/03/2015  . Stroke (HCC) 03/22/2015  . HTN (hypertension) 03/22/2015  . HLD (hyperlipidemia) 03/22/2015  . Prediabetes 03/22/2015  . Tobacco use disorder 03/22/2015  . [name] (transient ischemic attack) 03/18/2015  . History of fracture of ankle   . Right ankle pain     Past Surgical History:  Procedure Laterality Date  . HERNIA REPAIR    . INCISION AND DRAINAGE ABSCESS Left 07/21/2017   Procedure: INCISION AND DRAINAGE INGUINAL HERNIA ABSCESS;  Surgeon: Emelia Loron, MD;  Location: [facility] OR;  Service: General;  Laterality: Left;  . INGUINAL HERNIA REPAIR Bilateral 05/07/2017   Procedure: LAPAROSCOPIC RIGHT  INGUINAL HERNIA REPAIR AND  OPEN LEFT   INGUINAL EXPLORATION;  Surgeon: Axel Filler, MD;  Location: [facility] OR;  Service: General;  Laterality: Bilateral;  . INSERTION OF MESH Bilateral 05/07/2017   Procedure: INSERTION OF MESH;  Surgeon: Axel Filler, MD;  Location: [facility] OR;  Service: General;  Laterality: Bilateral;  . LEG SURGERY         Home Medications    Prior to Admission medications   Medication Sig Start Date End Date Taking? Authorizing Provider  amitriptyline (ELAVIL) 50 MG tablet Take 3 tablets (150 mg total) by mouth at bedtime. 02/11/17   Massie Maroon, FNP  amitriptyline (ELAVIL) 50 MG tablet TAKE THREE TABLETS BY MOUTH AT BEDTIME. 09/26/17   Massie Maroon, FNP  atorvastatin (LIPITOR) 80 MG tablet TAKE 1 TABLET BY MOUTH DAILY AT 6 PM. 12/30/16   Bing Neighbors, FNP  clopidogrel (PLAVIX) 75 MG tablet Take 1 tablet (75 mg total) by mouth daily. 02/11/17   Massie Maroon, FNP  gabapentin (NEURONTIN) 600 MG tablet TAKE 2 TABLETS BY MOUTH DAILY. 04/28/17   Massie Maroon, FNP  lisinopril-hydrochlorothiazide (PRINZIDE,ZESTORETIC) 10-12.5 MG tablet TAKE 1 TABLET BY MOUTH DAILY. 07/20/17   Massie Maroon, FNP  metFORMIN (GLUCOPHAGE) 500 MG tablet Take 1 tablet (500 mg total) by mouth 2 (two) times daily with a meal. 12/30/16   Bing Neighbors, FNP  metoprolol succinate (TOPROL-XL) 25 MG  24 hr tablet TAKE 1 TABLET BY MOUTH DAILY. 02/11/17   Massie Maroon, FNP  mupirocin ointment (BACTROBAN) 2 % Apply 1 application topically 3 (three) times daily. Patient not taking: Reported on 07/21/2017 12/24/16   Bing Neighbors, FNP  oxyCODONE (OXY IR/ROXICODONE) 5 MG immediate release tablet Take 1 tablet (5 mg total) by mouth every 6 (six) hours as needed for moderate pain. 07/22/17   Focht, Joyce Copa, PA  saccharomyces boulardii (FLORASTOR) 250 MG capsule Take 1 capsule (250 mg total) by mouth 2 (two) times daily. 06/04/17   Romie Levee, MD    sulfamethoxazole-trimethoprim (BACTRIM DS,SEPTRA DS) 800-160 MG tablet Take 1 tablet by mouth every 12 (twelve) hours. 07/22/17   Focht, Joyce Copa, PA  trimethoprim-polymyxin b (POLYTRIM) ophthalmic solution Place 1 drop into the right eye every 4 (four) hours. 01/16/18   Janace Aris, NP    Family History Family History  Problem Relation Age of Onset  . Stroke Brother        86s  . Heart attack Sister        85s    Social History Social History   Tobacco Use  . Smoking status: Current Every Day Smoker    Packs/day: 0.50    Years: 20.00    Pack years: 10.00    Types: Cigarettes  . Smokeless tobacco: Never Used  Substance Use Topics  . Alcohol use: No  . Drug use: Yes    Types: Marijuana     Allergies   Patient has no known allergies.   Review of Systems Review of Systems   Physical Exam Triage Vital Signs ED Triage Vitals [01/16/18 1313]  Enc Vitals Group     BP (!) 142/88     Pulse Rate (!) 55     Resp 16     Temp 97.8 F (36.6 C)     Temp src      SpO2 98 %     Weight      Height      Head Circumference      Peak Flow      Pain Score      Pain Loc      Pain Edu?      Excl. in GC?    No data found.  Updated Vital Signs BP (!) 142/88   Pulse (!) 55   Temp 97.8 F (36.6 C)   Resp 16   SpO2 98%   Visual Acuity Right Eye Distance:   Left Eye Distance:   Bilateral Distance:    Right Eye Near: R Near: 20/30 Left Eye Near:  L Near: 20/20 Bilateral Near:  20/20  Physical Exam  Constitutional: He is oriented to person, place, and time. He appears well-developed and well-nourished.  Very pleasant. Non toxic or ill appearing.     HENT:  Head: Normocephalic and atraumatic.  Eyes: Pupils are equal, round, and reactive to light. EOM are normal.  Right moderate scleral injection with upper lid swelling and redness. Non painful to touch globe. No obvious foreign bodies.   Neck: Normal range of motion.  Pulmonary/Chest: Effort normal.   Musculoskeletal: Normal range of motion.  Neurological: He is alert and oriented to person, place, and time.  Skin: Skin is warm and dry.  Psychiatric: He has a normal mood and affect.  Nursing note and vitals reviewed.    UC Treatments / Results  Labs (all labs ordered are listed, but only abnormal results are displayed) Labs Reviewed -  No data to display  EKG None  Radiology No results found.  Procedures Procedures (including critical care time)  Medications Ordered in UC Medications - No data to display  Initial Impression / Assessment and Plan / UC Course  I have reviewed the triage vital signs and the nursing notes.  Pertinent labs & imaging results that were available during my care of the patient were reviewed by me and considered in my medical decision making (see chart for details).     Corneal abrasion Will treat with Polytrim Instructed to follow-up for continued worsening symptoms. Final Clinical Impressions(s) / UC Diagnoses   Final diagnoses:  Abrasion of right cornea, initial encounter     Discharge Instructions     We will give you some antibiotic eyedrops for the right eye Use this every 4 hours while awake Follow-up for continued or worsening symptoms.    ED Prescriptions    Medication Sig Dispense Auth. Provider   trimethoprim-polymyxin b (POLYTRIM) ophthalmic solution Place 1 drop into the right eye every 4 (four) hours. 10 mL Dahlia Byes A, NP     Controlled Substance Prescriptions [place] Controlled Substance Registry consulted? Not Applicable   Janace Aris, NP 01/16/18 1349

## 2018-01-26 ENCOUNTER — Telehealth: Payer: Self-pay

## 2018-01-26 NOTE — Telephone Encounter (Signed)
Patient will make appointment on 10/14 at 2:40pm.

## 2018-01-30 ENCOUNTER — Ambulatory Visit: Payer: Self-pay | Admitting: Family Medicine

## 2018-01-31 ENCOUNTER — Telehealth: Payer: Self-pay

## 2018-01-31 NOTE — Telephone Encounter (Signed)
Called and spoke with patient, advised of appointment on 02/01/2018 and he will keep this appointment. Thanks!

## 2018-02-01 ENCOUNTER — Encounter: Payer: Self-pay | Admitting: Family Medicine

## 2018-02-01 ENCOUNTER — Ambulatory Visit (INDEPENDENT_AMBULATORY_CARE_PROVIDER_SITE_OTHER): Payer: Self-pay | Admitting: Family Medicine

## 2018-02-01 VITALS — BP 139/94 | HR 65 | Temp 97.8°F | Resp 18 | Ht 72.0 in | Wt 216.6 lb

## 2018-02-01 DIAGNOSIS — G8929 Other chronic pain: Secondary | ICD-10-CM

## 2018-02-01 DIAGNOSIS — M25561 Pain in right knee: Secondary | ICD-10-CM

## 2018-02-01 DIAGNOSIS — Z9889 Other specified postprocedural states: Secondary | ICD-10-CM

## 2018-02-01 DIAGNOSIS — Z8719 Personal history of other diseases of the digestive system: Secondary | ICD-10-CM

## 2018-02-01 DIAGNOSIS — F17200 Nicotine dependence, unspecified, uncomplicated: Secondary | ICD-10-CM

## 2018-02-01 DIAGNOSIS — I6302 Cerebral infarction due to thrombosis of basilar artery: Secondary | ICD-10-CM

## 2018-02-01 MED ORDER — KETOROLAC TROMETHAMINE 60 MG/2ML IM SOLN
60.0000 mg | Freq: Once | INTRAMUSCULAR | Status: AC
  Administered 2018-02-01: 60 mg via INTRAMUSCULAR

## 2018-02-01 NOTE — Patient Instructions (Signed)
I sent a referral for further evaluation of your knee pain. Get the xray today by going to the radiology department at [facility]. Keep the incision area clean and dry. Continue to cover with antibiotic ointment.   RICE for Routine Care of Injuries Many injuries can be cared for using rest, ice, compression, and elevation (RICE therapy). Using RICE therapy can help to lessen pain and swelling. It can help your body to heal. Rest Reduce your normal activities and avoid using the injured part of your body. You can go back to your normal activities when you feel okay and your doctor says it is okay. Ice Do not put ice on your bare skin.  Put ice in a plastic bag.  Place a towel between your skin and the bag.  Leave the ice on for 20 minutes, 2-3 times a day.  Do this for as long as told by your doctor. Compression Compression means putting pressure on the injured area. This can be done with an elastic bandage. If an elastic bandage has been applied:  Remove and reapply the bandage every 3-4 hours or as told by your doctor.  Make sure the bandage is not wrapped too tight. Wrap the bandage more loosely if part of your body beyond the bandage is blue, swollen, cold, painful, or loses feeling (numb).  See your doctor if the bandage seems to make your problems worse.  Elevation Elevation means keeping the injured area raised. Raise the injured area above your heart or the center of your chest if you can. When should I get help? You should get help if:  You keep having pain and swelling.  Your symptoms get worse.  Get help right away if: You should get help right away if:  You have sudden bad pain at or below the area of your injury.  You have redness or more swelling around your injury.  You have tingling or numbness at or below the injury that does not go away when you take off the bandage.  This information is not intended to replace advice given to you by your health care  provider. Make sure you discuss any questions you have with your health care provider. Document Released: 09/22/2007 Document Revised: 03/02/2016 Document Reviewed: 03/13/2014 Elsevier Interactive Patient Education  2017 Elsevier Inc.     Knee Pain, Adult Many things can cause knee pain. The pain often goes away on its own with time and rest. If the pain does not go away, tests may be done to find out what is causing the pain. Follow these instructions at home: Activity  Rest your knee.  Do not do things that cause pain.  Avoid activities where both feet leave the ground at the same time (high-impact activities). Examples are running, jumping rope, and doing jumping jacks. General instructions  Take medicines only as told by your doctor.  Raise (elevate) your knee when you are resting. Make sure your knee is higher than your heart.  Sleep with a pillow under your knee.  If told, put ice on the knee: ? Put ice in a plastic bag. ? Place a towel between your skin and the bag. ? Leave the ice on for 20 minutes, 2-3 times a day.  Ask your doctor if you should wear an elastic knee support.  Lose weight if you are overweight. Being overweight can make your knee hurt more.  Do not use any tobacco products. These include cigarettes, chewing tobacco, or electronic cigarettes. If you  need help quitting, ask your doctor. Smoking may slow down healing. Contact a doctor if:  The pain does not stop.  The pain changes or gets worse.  You have a fever along with knee pain.  Your knee gives out or locks up.  Your knee swells, and becomes worse. Get help right away if:  Your knee feels warm.  You cannot move your knee.  You have very bad knee pain.  You have chest pain.  You have trouble breathing. Summary  Many things can cause knee pain. The pain often goes away on its own with time and rest.  Avoid activities that put stress on your knee. These include running and  jumping rope.  Get help right away if you cannot move your knee, or if your knee feels warm, or if you have trouble breathing. This information is not intended to replace advice given to you by your health care provider. Make sure you discuss any questions you have with your health care provider. Document Released: 07/02/2008 Document Revised: 03/30/2016 Document Reviewed: 03/30/2016 Elsevier Interactive Patient Education  2017 [employer].

## 2018-02-01 NOTE — Progress Notes (Signed)
[facility]  Provider: Mike Gip, FNP   Hypertension Follow Up Visit  SUBJECTIVE:  Aaron Kennedy is a 47 y.o. male who  has a past medical history of Hypertension and Stroke (HCC). .  Stroke in 2016 and has not been able to return to work full time. He presents today with complaints of drainage from his inguinal repair. Patient states that the area became infected in February. Seen in the ED 06/03/2017. Re-infected in April. Last seen in the ED in 07/20/2017 for inguinal abscess. Patient reports that the area is not painful. He is having "clear pus kind of stuff, but not infection". Patient report foul odor.  Patient states the right knee pain is a 7/10 and is worsened by movement. Describes the pain as nagging.   Current Outpatient Medications  Medication Sig Dispense Refill  . amitriptyline (ELAVIL) 50 MG tablet Take 3 tablets (150 mg total) by mouth at bedtime. 90 tablet 0  . amitriptyline (ELAVIL) 50 MG tablet TAKE THREE TABLETS BY MOUTH AT BEDTIME. 90 tablet 0  . atorvastatin (LIPITOR) 80 MG tablet TAKE 1 TABLET BY MOUTH DAILY AT 6 PM. 30 tablet 5  . clopidogrel (PLAVIX) 75 MG tablet Take 1 tablet (75 mg total) by mouth daily. 30 tablet 5  . gabapentin (NEURONTIN) 600 MG tablet TAKE 2 TABLETS BY MOUTH DAILY. 60 tablet 5  . lisinopril-hydrochlorothiazide (PRINZIDE,ZESTORETIC) 10-12.5 MG tablet TAKE 1 TABLET BY MOUTH DAILY. 90 tablet 3  . metFORMIN (GLUCOPHAGE) 500 MG tablet Take 1 tablet (500 mg total) by mouth 2 (two) times daily with a meal. 180 tablet 3  . metoprolol succinate (TOPROL-XL) 25 MG 24 hr tablet TAKE 1 TABLET BY MOUTH DAILY. 30 tablet 5  . trimethoprim-polymyxin b (POLYTRIM) ophthalmic solution Place 1 drop into the right eye every 4 (four) hours. 10 mL 0  . mupirocin ointment (BACTROBAN) 2 % Apply 1 application topically 3 (three) times daily. (Patient not taking: Reported on 07/21/2017) 30 g 0  . oxyCODONE (OXY  IR/ROXICODONE) 5 MG immediate release tablet Take 1 tablet (5 mg total) by mouth every 6 (six) hours as needed for moderate pain. (Patient not taking: Reported on 02/01/2018) 15 tablet 0  . saccharomyces boulardii (FLORASTOR) 250 MG capsule Take 1 capsule (250 mg total) by mouth 2 (two) times daily. (Patient not taking: Reported on 02/01/2018)    . sulfamethoxazole-trimethoprim (BACTRIM DS,SEPTRA DS) 800-160 MG tablet Take 1 tablet by mouth every 12 (twelve) hours. (Patient not taking: Reported on 02/01/2018) 14 tablet 0   No current facility-administered medications for this visit.     No results found for this or any previous visit (from the past 2160 hour(s)).  Hypertension ROS: Review of Systems  Constitutional: Negative.   HENT: Negative.   Eyes: Negative.   Respiratory: Negative.   Cardiovascular: Negative.   Gastrointestinal: Negative.   Genitourinary: Negative.        Drainage from left inguinal hernia incision   Musculoskeletal: Joint pain: right knee.  Skin: Negative.   Neurological: Negative.   Psychiatric/Behavioral: Negative.      OBJECTIVE:   BP (!) 139/94 (BP Location: Left Arm, Patient Position: Sitting, Cuff Size: Normal)   Pulse 65   Temp 97.8 F (36.6 C) (Oral)   Resp 18   Ht 6' (1.829 m)   Wt 216 lb 9.6 oz (98.2 kg)   SpO2 97%   BMI 29.38 kg/m   Physical Exam  Constitutional: He is oriented  to person, place, and time. He appears well-developed and well-nourished. No distress.  HENT:  Head: Normocephalic and atraumatic.  Eyes: Pupils are equal, round, and reactive to light. EOM are normal.  Neck: Normal range of motion.  Cardiovascular: Normal rate, regular rhythm and normal heart sounds.  No murmur heard. Pulmonary/Chest: Effort normal and breath sounds normal. No respiratory distress.  Musculoskeletal: He exhibits tenderness (right knee with crepitus FROM).  Neurological: He is alert and oriented to person, place, and time.  Skin:         ASSESSMENT/PLAN:  1. Cerebrovascular accident (CVA) due to thrombosis of basilar artery (HCC) Pending labs. Will adjust medications accordingly.   - Comprehensive metabolic panel - CBC with Differential  2. Chronic pain of right knee RICE therapy - Ambulatory referral to Physical Medicine Rehab - ketorolac (TORADOL) injection 60 mg - DG Knee Complete 4 Views Right; Future  3. S/P hernia repair continue with topical antibiotic ointment. Sign and symptoms of infection discussed.   4. Tobacco use disorder Smoking cessation instruction/counseling given:  counseled patient on the dangers of tobacco use, advised patient to stop smoking, and reviewed strategies to maximize success   The patient is asked to make an attempt to improve diet and exercise patterns to aid in medical management of this problem.  Return to care as scheduled and prn. Patient verbalized understanding and agreed with plan of care.    Ms. Freda Jackson. Riley Lam, FNP-BC [facility] [address] [place], [place] [address] [phone]

## 2018-02-02 LAB — CBC WITH DIFFERENTIAL/PLATELET
Basophils Absolute: 0 10*3/uL (ref 0.0–0.2)
Basos: 1 %
EOS (ABSOLUTE): 0.2 10*3/uL (ref 0.0–0.4)
Eos: 2 %
Hematocrit: 49 % (ref 37.5–51.0)
Hemoglobin: 16.8 g/dL (ref 13.0–17.7)
Immature Grans (Abs): 0 10*3/uL (ref 0.0–0.1)
Immature Granulocytes: 0 %
Lymphocytes Absolute: 3.2 10*3/uL — ABNORMAL HIGH (ref 0.7–3.1)
Lymphs: 37 %
MCH: 33.5 pg — ABNORMAL HIGH (ref 26.6–33.0)
MCHC: 34.3 g/dL (ref 31.5–35.7)
MCV: 98 fL — ABNORMAL HIGH (ref 79–97)
Monocytes Absolute: 0.7 10*3/uL (ref 0.1–0.9)
Monocytes: 9 %
Neutrophils Absolute: 4.3 10*3/uL (ref 1.4–7.0)
Neutrophils: 51 %
Platelets: 209 10*3/uL (ref 150–450)
RBC: 5.02 x10E6/uL (ref 4.14–5.80)
RDW: 13.5 % (ref 12.3–15.4)
WBC: 8.4 10*3/uL (ref 3.4–10.8)

## 2018-02-02 LAB — COMPREHENSIVE METABOLIC PANEL
ALT: 40 IU/L (ref 0–44)
AST: 22 IU/L (ref 0–40)
Albumin/Globulin Ratio: 2 (ref 1.2–2.2)
Albumin: 4.5 g/dL (ref 3.5–5.5)
Alkaline Phosphatase: 125 IU/L — ABNORMAL HIGH (ref 39–117)
BUN/Creatinine Ratio: 11 (ref 9–20)
BUN: 10 mg/dL (ref 6–24)
Bilirubin Total: 0.3 mg/dL (ref 0.0–1.2)
CO2: 20 mmol/L (ref 20–29)
Calcium: 9 mg/dL (ref 8.7–10.2)
Chloride: 102 mmol/L (ref 96–106)
Creatinine, Ser: 0.93 mg/dL (ref 0.76–1.27)
GFR calc Af Amer: 113 mL/min/{1.73_m2} (ref >59)
GFR calc non Af Amer: 97 mL/min/{1.73_m2} (ref >59)
Globulin, Total: 2.3 g/dL (ref 1.5–4.5)
Glucose: 95 mg/dL (ref 65–99)
Potassium: 4.2 mmol/L (ref 3.5–5.2)
Sodium: 139 mmol/L (ref 134–144)
Total Protein: 6.8 g/dL (ref 6.0–8.5)

## 2018-02-08 MED FILL — GABAPENTIN 600 MG TABLET: 600 | 30 days supply | Qty: 60 | Fill #4

## 2018-02-08 MED FILL — CLOPIDOGREL 75 MG TABLET: 75 | 30 days supply | Qty: 30 | Fill #5

## 2018-02-08 MED FILL — METOPROLOL SUCCINATE ER 25: 25 | 30 days supply | Qty: 30 | Fill #2

## 2018-02-08 MED FILL — LISINOPRIL-HCTZ 10-12.5 MG: 10-12.5 | 30 days supply | Qty: 30 | Fill #4

## 2018-03-06 ENCOUNTER — Encounter: Payer: Self-pay | Admitting: Physical Medicine & Rehabilitation

## 2018-03-28 ENCOUNTER — Other Ambulatory Visit: Payer: Self-pay | Admitting: Family Medicine

## 2018-03-28 DIAGNOSIS — I6302 Cerebral infarction due to thrombosis of basilar artery: Secondary | ICD-10-CM

## 2018-03-28 MED FILL — METOPROLOL SUCCINATE ER 25: 25 | 30 days supply | Qty: 30 | Fill #3

## 2018-03-28 MED FILL — LISINOPRIL-HCTZ 10-12.5 MG: 10-12.5 | 30 days supply | Qty: 30 | Fill #5

## 2018-03-28 MED FILL — GABAPENTIN 600 MG TABLET: 600 | 30 days supply | Qty: 60 | Fill #5

## 2018-03-28 MED FILL — CLOPIDOGREL 75 MG TABLET: 75 | 30 days supply | Qty: 30 | Fill #0

## 2018-03-31 ENCOUNTER — Encounter: Payer: Self-pay | Admitting: Physical Medicine & Rehabilitation

## 2018-04-14 ENCOUNTER — Encounter: Payer: Self-pay | Attending: Physical Medicine & Rehabilitation | Admitting: Physical Medicine & Rehabilitation

## 2018-04-26 ENCOUNTER — Ambulatory Visit: Payer: Self-pay | Admitting: Physical Medicine & Rehabilitation

## 2018-05-04 ENCOUNTER — Ambulatory Visit: Payer: Self-pay | Admitting: Family Medicine

## 2018-05-10 ENCOUNTER — Ambulatory Visit (INDEPENDENT_AMBULATORY_CARE_PROVIDER_SITE_OTHER): Payer: Self-pay | Admitting: Family Medicine

## 2018-05-10 VITALS — BP 132/92 | HR 70 | Temp 98.1°F | Resp 16 | Ht 72.0 in | Wt 217.0 lb

## 2018-05-10 DIAGNOSIS — M25561 Pain in right knee: Secondary | ICD-10-CM

## 2018-05-10 DIAGNOSIS — G8929 Other chronic pain: Secondary | ICD-10-CM

## 2018-05-10 DIAGNOSIS — F17200 Nicotine dependence, unspecified, uncomplicated: Secondary | ICD-10-CM

## 2018-05-10 DIAGNOSIS — R7303 Prediabetes: Secondary | ICD-10-CM

## 2018-05-10 DIAGNOSIS — I10 Essential (primary) hypertension: Secondary | ICD-10-CM

## 2018-05-10 DIAGNOSIS — F419 Anxiety disorder, unspecified: Secondary | ICD-10-CM

## 2018-05-10 DIAGNOSIS — I639 Cerebral infarction, unspecified: Secondary | ICD-10-CM

## 2018-05-10 LAB — POCT URINALYSIS DIPSTICK
Glucose, UA: NEGATIVE
Leukocytes, UA: NEGATIVE
Nitrite, UA: NEGATIVE
Protein, UA: POSITIVE — AB
Spec Grav, UA: 1.02 (ref 1.010–1.025)
Urobilinogen, UA: 4 E.U./dL — AB
pH, UA: 7 (ref 5.0–8.0)

## 2018-05-10 LAB — POCT GLYCOSYLATED HEMOGLOBIN (HGB A1C): Hemoglobin A1C: 5.8 % — AB (ref 4.0–5.6)

## 2018-05-10 NOTE — Progress Notes (Signed)
[facility]   Progress Note: General Provider: Mike Gip, FNP  SUBJECTIVE:   Aaron Kennedy is a 48 y.o. male who  has a past medical history of Hypertension and Stroke (HCC).. Patient presents today for Follow-up (prediabetes ); Hypertension; and Medication Problem (wants to disscus changing Amitriptyline) Patient is requesting valium for his anxiety.   Review of Systems  Constitutional: Negative.   HENT: Negative.   Eyes: Negative.   Respiratory: Negative.   Cardiovascular: Negative.   Gastrointestinal: Negative.   Genitourinary: Negative.   Musculoskeletal: Negative.   Skin: Negative.   Neurological: Negative.   Psychiatric/Behavioral: Negative for depression and suicidal ideas. The patient is nervous/anxious. The patient does not have insomnia.      OBJECTIVE: BP (!) 132/92   Pulse 70   Temp 98.1 F (36.7 C) (Oral)   Resp 16   Ht 6' (1.829 m)   Wt 217 lb (98.4 kg)   SpO2 98%   BMI 29.43 kg/m   Wt Readings from Last 3 Encounters:  05/10/18 217 lb (98.4 kg)  02/01/18 216 lb 9.6 oz (98.2 kg)  05/19/17 226 lb (102.5 kg)     Physical Exam Vitals signs and nursing note reviewed.  Constitutional:      General: He is not in acute distress.    Appearance: He is well-developed.  HENT:     Head: Normocephalic and atraumatic.  Eyes:     Conjunctiva/sclera: Conjunctivae normal.     Pupils: Pupils are equal, round, and reactive to light.  Neck:     Musculoskeletal: Normal range of motion.  Cardiovascular:     Rate and Rhythm: Normal rate and regular rhythm.     Heart sounds: Normal heart sounds.  Pulmonary:     Effort: Pulmonary effort is normal. No respiratory distress.     Breath sounds: Normal breath sounds.  Musculoskeletal: Normal range of motion.  Skin:    General: Skin is warm and dry.  Neurological:     Mental Status: He is alert and oriented to person, place, and time. Mental status is at baseline.   Psychiatric:        Mood and Affect: Mood normal.        Behavior: Behavior normal.        Thought Content: Thought content normal.        Judgment: Judgment normal.     ASSESSMENT/PLAN:  1. Essential hypertension Patient did not take his medications today. Advised him to take meds daily as prescribed.  - Urinalysis Dipstick  2. Prediabetes A1C 5.8 Advised patient to take metformin daily.  - HgB A1c  3. Cerebrovascular accident (CVA), unspecified mechanism (HCC) Advised patient to manage htn to avoid future incident.   4. Chronic pain of right knee Gave patient the number to reschedule appointment.   5. Anxiety [facility]. Advised patient that this provider does not manage with valium. Offered SSRI and patient declined.   6. Tobacco use disorder Not interested in quitting.      Return in about 3 months (around 08/09/2018) for HTN.    The patient was given clear instructions to go to ER or return to medical center if symptoms do not improve, worsen or new problems develop. The patient verbalized understanding and agreed with plan of care.   Ms. Freda Jackson. Riley Lam, FNP-BC [facility] [address] [place], [place] [address] [phone]

## 2018-05-10 NOTE — Patient Instructions (Signed)
[facility] Address: [address] [place], [place], [place] [address] Phone: [phone]

## 2018-05-11 LAB — MICROALBUMIN, URINE: Microalbumin, Urine: 9.4 ug/mL

## 2018-05-15 ENCOUNTER — Other Ambulatory Visit: Payer: Self-pay | Admitting: Family Medicine

## 2018-05-15 DIAGNOSIS — G629 Polyneuropathy, unspecified: Secondary | ICD-10-CM

## 2018-05-15 MED FILL — LISINOPRIL-HCTZ 10-12.5 MG: 10-12.5 | 30 days supply | Qty: 30 | Fill #6

## 2018-05-15 MED FILL — CLOPIDOGREL 75 MG TABLET: 75 | 30 days supply | Qty: 30 | Fill #1

## 2018-05-15 MED FILL — METOPROLOL SUCCINATE ER 25: 25 | 30 days supply | Qty: 30 | Fill #4

## 2018-05-16 MED FILL — GABAPENTIN 600 MG TABLET: 600 | 30 days supply | Qty: 60 | Fill #0

## 2018-05-17 IMAGING — DX DG CHEST 2V
2 series · 2 of 2 positions shown · non-contrast
Comparison: 03/18/2015

CLINICAL DATA: Cough.

EXAM:
CHEST  2 VIEW

[chest pa]
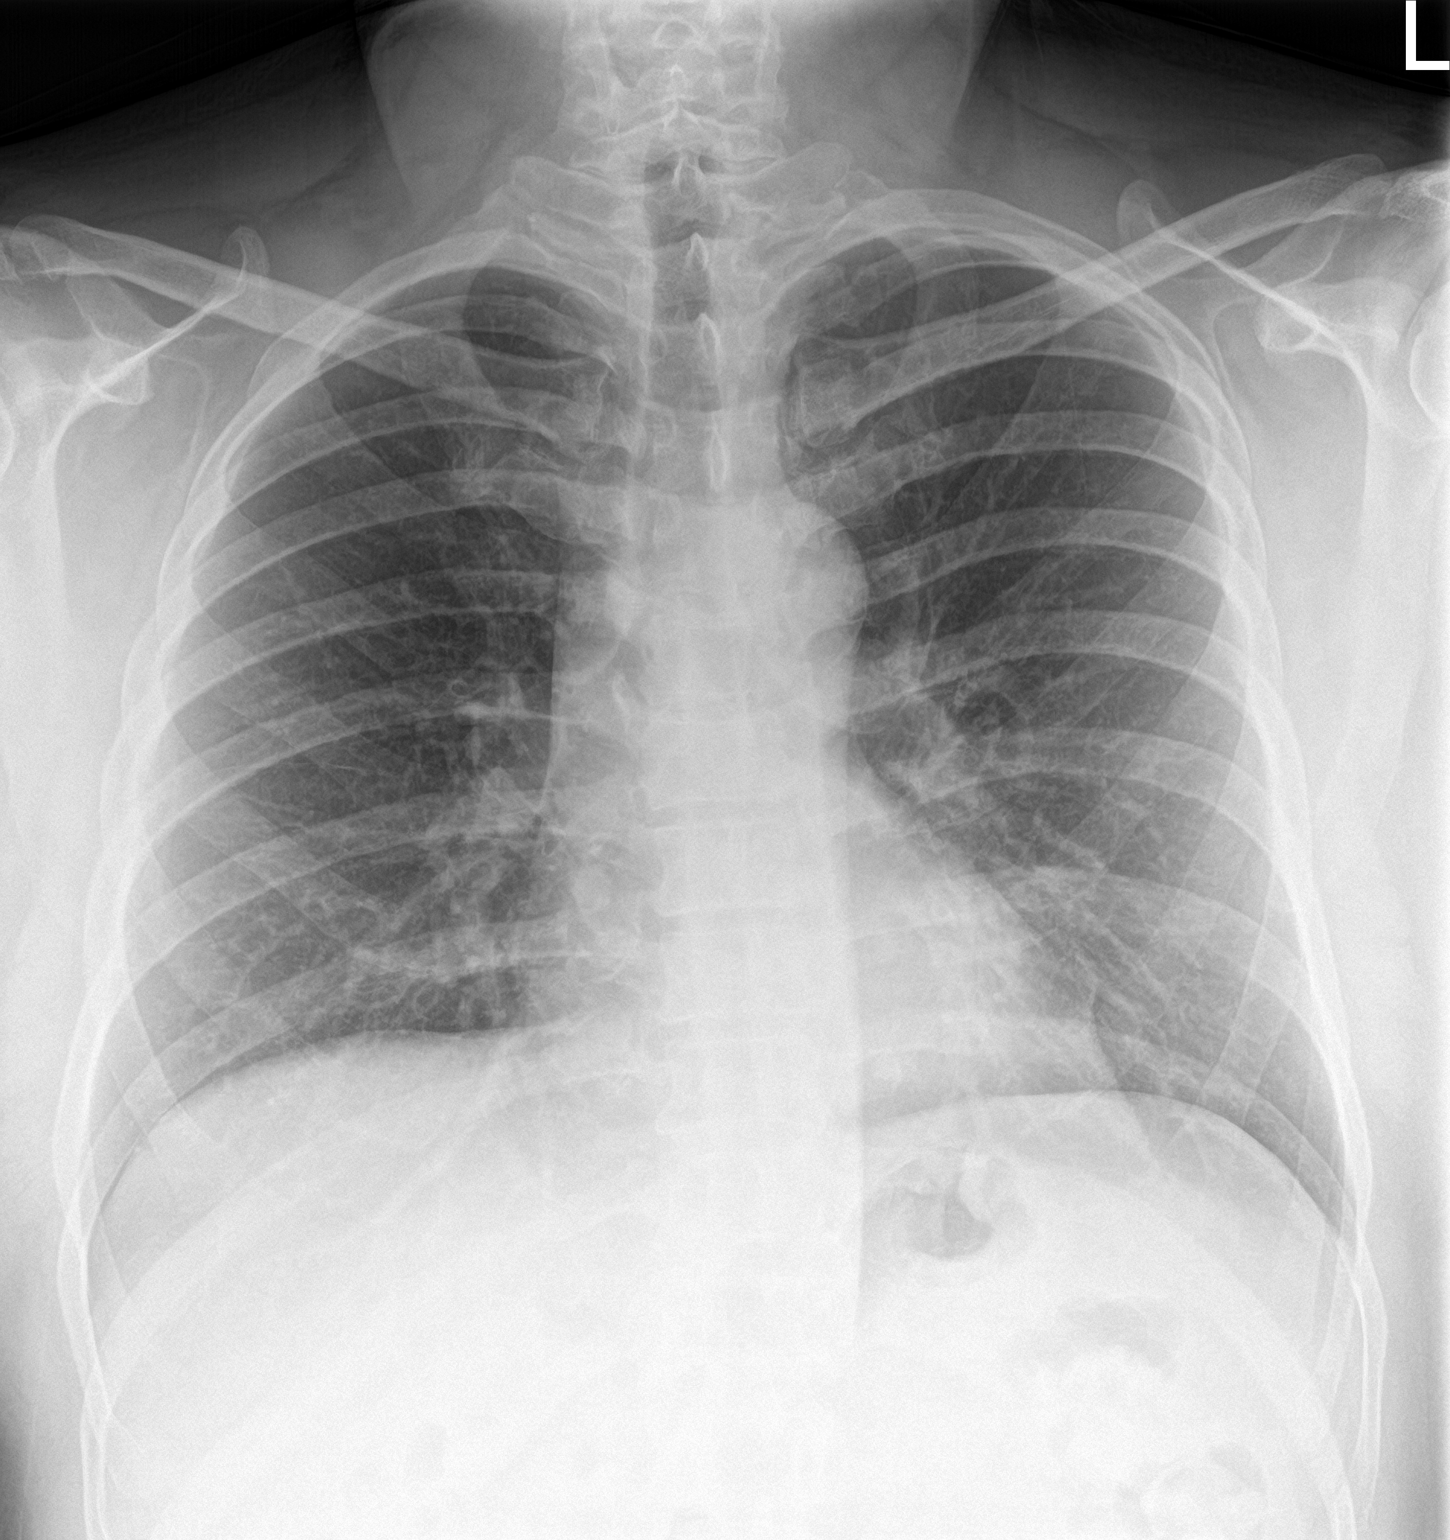

[chest lat]
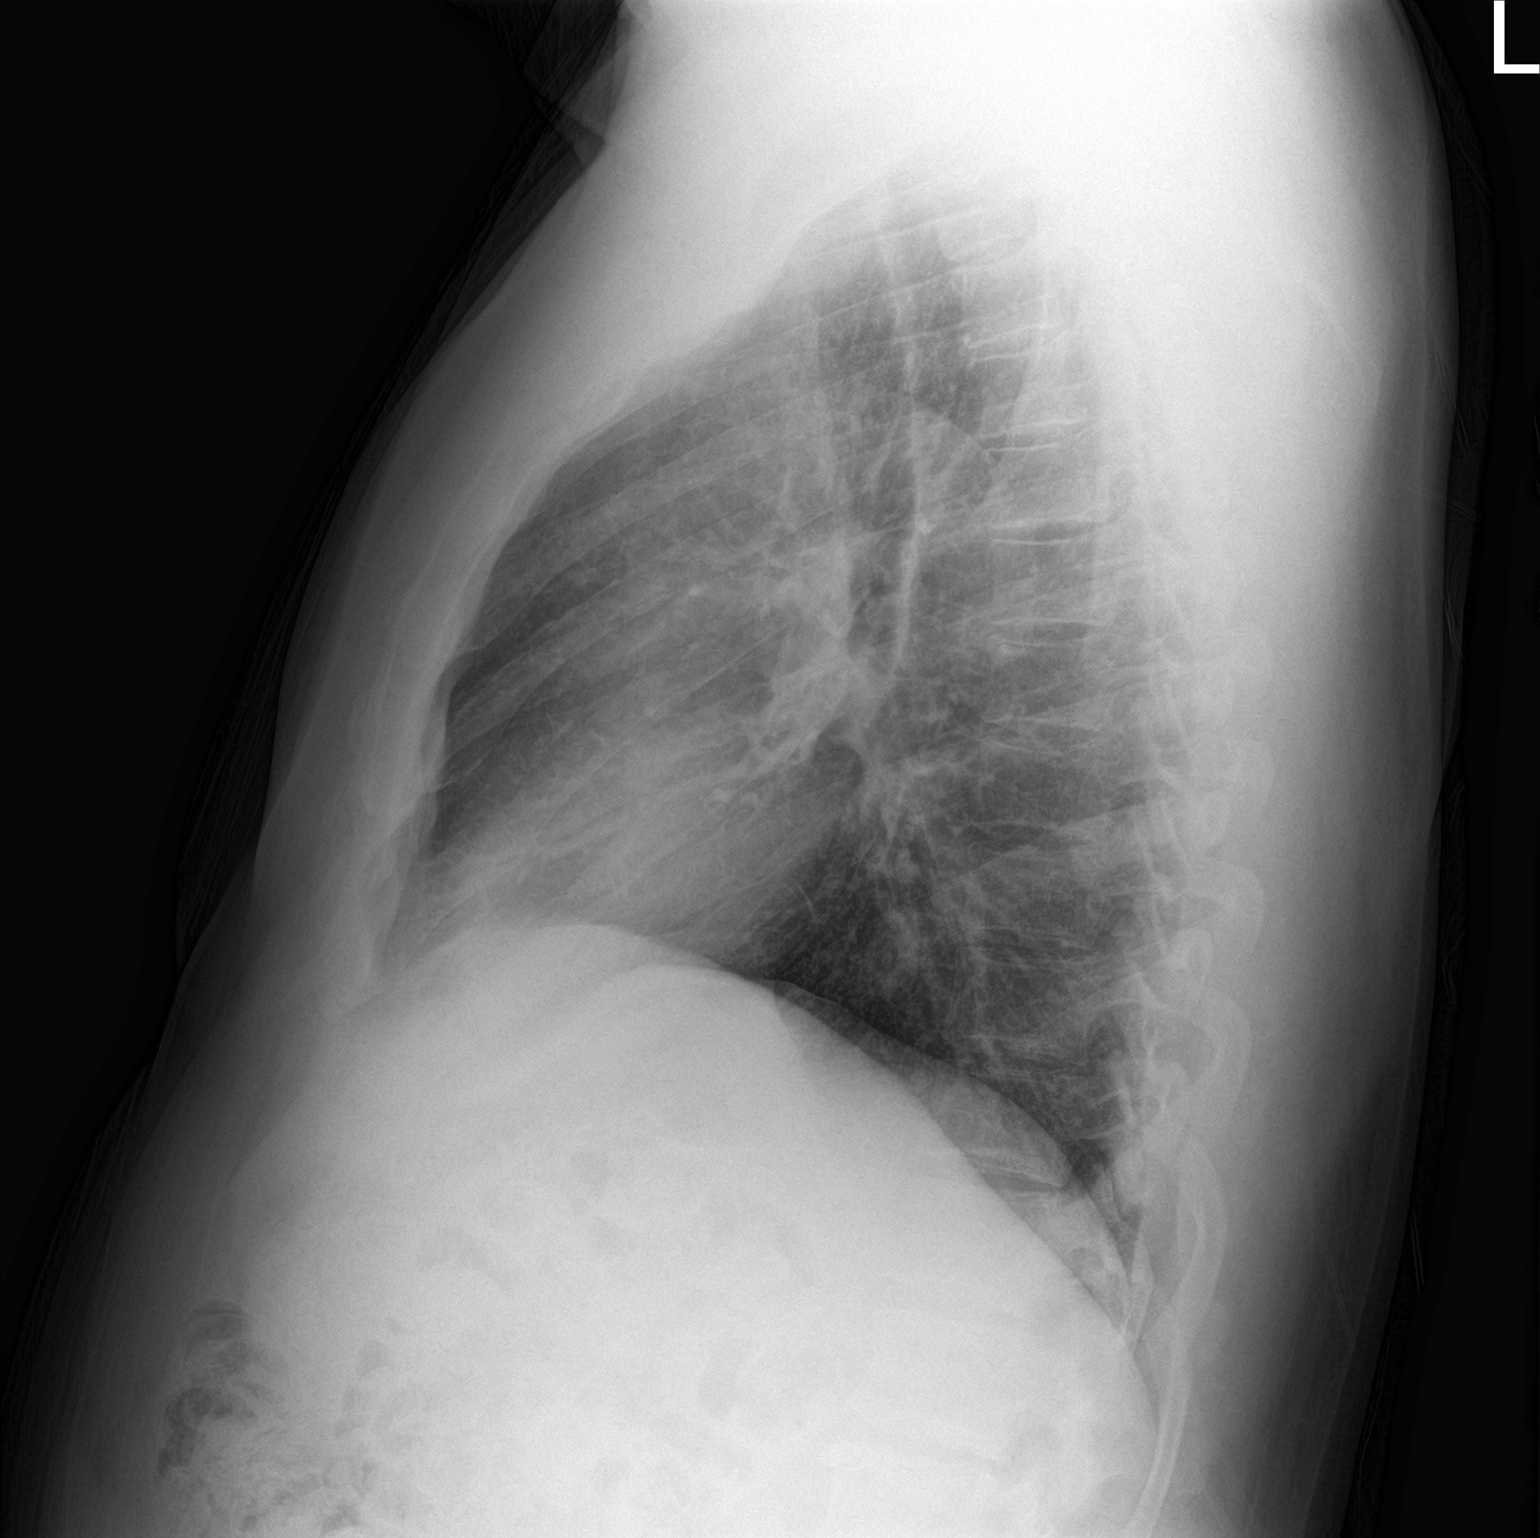

[2 of 2 positions shown; findings below may reference images not displayed]

FINDINGS: The heart size and mediastinal contours are within normal limits.
Both lungs are clear. The visualized skeletal structures are
unremarkable.
IMPRESSION: No active cardiopulmonary disease.

## 2018-06-19 MED FILL — METOPROLOL SUCCINATE ER 25: 25 | 30 days supply | Qty: 30 | Fill #4

## 2018-06-19 MED FILL — GABAPENTIN 600 MG TABLET: 600 | 30 days supply | Qty: 60 | Fill #1

## 2018-06-19 MED FILL — LISINOPRIL-HCTZ 10-12.5 MG: 10-12.5 | 30 days supply | Qty: 30 | Fill #6

## 2018-06-19 MED FILL — CLOPIDOGREL 75 MG TABLET: 75 | 30 days supply | Qty: 30 | Fill #1

## 2018-08-09 ENCOUNTER — Ambulatory Visit (INDEPENDENT_AMBULATORY_CARE_PROVIDER_SITE_OTHER): Payer: Self-pay | Admitting: Family Medicine

## 2018-08-09 ENCOUNTER — Other Ambulatory Visit: Payer: Self-pay

## 2018-08-09 ENCOUNTER — Encounter: Payer: Self-pay | Admitting: Family Medicine

## 2018-08-09 DIAGNOSIS — I6302 Cerebral infarction due to thrombosis of basilar artery: Secondary | ICD-10-CM

## 2018-08-09 DIAGNOSIS — E785 Hyperlipidemia, unspecified: Secondary | ICD-10-CM

## 2018-08-09 DIAGNOSIS — G629 Polyneuropathy, unspecified: Secondary | ICD-10-CM

## 2018-08-09 DIAGNOSIS — I10 Essential (primary) hypertension: Secondary | ICD-10-CM

## 2018-08-09 MED ORDER — LISINOPRIL-HYDROCHLOROTHIAZIDE 10-12.5 MG PO TABS: 1.0000 | ORAL_TABLET | Freq: Every day | ORAL | 0 refills | Status: DC

## 2018-08-09 MED ORDER — ATORVASTATIN CALCIUM 80 MG PO TABS: ORAL_TABLET | ORAL | 5 refills | Status: DC

## 2018-08-09 MED ORDER — METOPROLOL SUCCINATE ER 25 MG PO TB24: 25.0000 mg | ORAL_TABLET | Freq: Every day | ORAL | 5 refills | Status: DC

## 2018-08-09 MED ORDER — GABAPENTIN 600 MG PO TABS: 1200.0000 mg | ORAL_TABLET | Freq: Every day | ORAL | 5 refills | Status: DC

## 2018-08-09 MED ORDER — LISINOPRIL-HYDROCHLOROTHIAZIDE 10-12.5 MG PO TABS: 1.0000 | ORAL_TABLET | Freq: Every day | ORAL | 3 refills | Status: DC

## 2018-08-09 MED ORDER — CLOPIDOGREL BISULFATE 75 MG PO TABS: 75.0000 mg | ORAL_TABLET | Freq: Every day | ORAL | 3 refills | Status: DC

## 2018-08-09 MED FILL — LISINOPRIL-HCTZ 10-12.5 MG: 10-12.5 | 30 days supply | Qty: 30 | Fill #0

## 2018-08-09 MED FILL — METOPROLOL SUCCINATE ER 25: 25 | 30 days supply | Qty: 30 | Fill #0

## 2018-08-09 MED FILL — CLOPIDOGREL 75 MG TABLET: 75 | 30 days supply | Qty: 30 | Fill #0

## 2018-08-09 MED FILL — GABAPENTIN 600 MG TABLET: 600 | 30 days supply | Qty: 60 | Fill #0

## 2018-08-09 MED FILL — ATORVASTATIN 80 MG TABLET: 80 | 30 days supply | Qty: 30 | Fill #0

## 2018-08-09 NOTE — Progress Notes (Signed)
  [facility]  Virtual Visit via Telephone Note  I connected with [name] on 08/09/18 at  8:00 AM EDT by telephone and verified that I am speaking with the correct person using two identifiers.   I discussed the limitations, risks, security and privacy concerns of performing an evaluation and management service by telephone and the availability of in person appointments. I also discussed with the patient that there may be a patient responsible charge related to this service. The patient expressed understanding and agreed to proceed.   History of Present Illness: ZYLEN HOGANSON  has a past medical history of Hypertension and Stroke (HCC).  Patient reports checking BP at home intermittently. The last BP was 139/78 a few days ago. He states that he is compliant with his medications. He denies side effects. He states that he only has a few days left of this medication and has been trying to contact the pharmacy about this. He denies chest pain, sob, dizziness or leg swelling.      Observations/Objective: Patient with regular voice tone, rate and rhythm. Speaking calmly and is in no apparent distress.    Assessment and Plan: 1. Essential hypertension - metoprolol succinate (TOPROL-XL) 25 MG 24 hr tablet; Take 1 tablet (25 mg total) by mouth daily.  Dispense: 90 tablet; Refill: 5 - lisinopril-hydrochlorothiazide (ZESTORETIC) 10-12.5 MG tablet; Take 1 tablet by mouth daily.  Dispense: 30 tablet; Refill: 0  2. Neuropathy - gabapentin (NEURONTIN) 600 MG tablet; Take 2 tablets (1,200 mg total) by mouth daily.  Dispense: 60 tablet; Refill: 5  3. Cerebrovascular accident (CVA) due to thrombosis of basilar artery (HCC) - clopidogrel (PLAVIX) 75 MG tablet; Take 1 tablet (75 mg total) by mouth daily.  Dispense: 90 tablet; Refill: 3  4. Hyperlipidemia, unspecified hyperlipidemia type - atorvastatin (LIPITOR) 80 MG tablet; TAKE 1 TABLET BY MOUTH  DAILY AT 6 PM.  Dispense: 30 tablet; Refill: 5   I sent a 30 day script for lisinopril to [facility] for the patient. Will have nurse to call [facility] pharmacy to see about getting his medications shipped quickly today or arrange pick up.    Follow Up Instructions:  We discussed hand washing, using hand sanitizer when soap and water are not available, only going out when absolutely necessary, and social distancing. Explained to patient that he is immunocompromised and will need to take precautions during this time.   I discussed the assessment and treatment plan with the patient. The patient was provided an opportunity to ask questions and all were answered. The patient agreed with the plan and demonstrated an understanding of the instructions.   The patient was advised to call back or seek an in-person evaluation if the symptoms worsen or if the condition fails to improve as anticipated.  I provided 10 minutes of non-face-to-face time during this encounter.  Ms. Andr L. Riley Lam, FNP-BC [facility] [address] [place], [place] [address] [phone]

## 2018-09-22 MED FILL — CLOPIDOGREL 75 MG TABLET: 75 | 30 days supply | Qty: 30 | Fill #1

## 2018-09-22 MED FILL — ?METOPROLOL SUCC ER 25MG TA: 25 | 30 days supply | Qty: 30 | Fill #1

## 2018-09-22 MED FILL — LISINOPRIL-HCTZ 10-12.5 TAB: 10-12.5 | 30 days supply | Qty: 30 | Fill #1

## 2018-09-22 MED FILL — GABAPENTIN 600 MG TABLET: 600 | 30 days supply | Qty: 60 | Fill #1

## 2018-11-08 ENCOUNTER — Ambulatory Visit: Payer: Self-pay | Admitting: Family Medicine

## 2018-11-08 MED FILL — GABAPENTIN 600 MG TABLET: 600 | 30 days supply | Qty: 60 | Fill #2

## 2018-11-08 MED FILL — ?METOPROLOL SUCC ER 25MG TA: 25 | 30 days supply | Qty: 30 | Fill #5

## 2018-11-08 MED FILL — LISINOPRIL-HCTZ 10-12.5 MG: 10-12.5 | 30 days supply | Qty: 30 | Fill #2

## 2018-11-17 ENCOUNTER — Ambulatory Visit: Payer: Self-pay | Admitting: Family Medicine

## 2018-12-14 MED FILL — ?METOPROLOL SUCC ER 25MG TA: 25 | 30 days supply | Qty: 30 | Fill #2

## 2018-12-14 MED FILL — LISINOPRIL-HCTZ 10-12.5 MG: 10-12.5 | 30 days supply | Qty: 30 | Fill #3

## 2018-12-14 MED FILL — CLOPIDOGREL 75 MG TABLET: 75 | 30 days supply | Qty: 30 | Fill #2

## 2018-12-14 MED FILL — GABAPENTIN 600 MG TABLET: 600 | 30 days supply | Qty: 60 | Fill #2

## 2018-12-27 ENCOUNTER — Encounter (HOSPITAL_COMMUNITY): Payer: Self-pay

## 2018-12-27 ENCOUNTER — Encounter (HOSPITAL_COMMUNITY): Payer: Self-pay | Admitting: *Deleted

## 2019-01-18 DIAGNOSIS — G4700 Insomnia, unspecified: Secondary | ICD-10-CM

## 2019-01-18 HISTORY — DX: Insomnia, unspecified: G47.00

## 2019-01-24 MED FILL — ?METOPROLOL SUCC ER 25MG TA: 25 | 30 days supply | Qty: 30 | Fill #3

## 2019-01-24 MED FILL — LISINOPRIL-HCTZ 10-12.5 MG: 10-12.5 | 30 days supply | Qty: 30 | Fill #4

## 2019-01-24 MED FILL — CLOPIDOGREL 75 MG TABLET: 75 | 30 days supply | Qty: 30 | Fill #3

## 2019-01-24 MED FILL — GABAPENTIN 600 MG TABLET: 600 | 30 days supply | Qty: 60 | Fill #3

## 2019-02-06 ENCOUNTER — Other Ambulatory Visit: Payer: Self-pay

## 2019-02-06 ENCOUNTER — Ambulatory Visit (INDEPENDENT_AMBULATORY_CARE_PROVIDER_SITE_OTHER): Payer: Self-pay | Admitting: Family Medicine

## 2019-02-06 ENCOUNTER — Encounter: Payer: Self-pay | Admitting: Family Medicine

## 2019-02-06 ENCOUNTER — Ambulatory Visit (HOSPITAL_COMMUNITY)
Admission: RE | Admit: 2019-02-06 | Discharge: 2019-02-06 | Disposition: A | Discharge status: Home / Self Care | Payer: Self-pay | Source: Ambulatory Visit | Attending: Family Medicine | Admitting: Family Medicine

## 2019-02-06 VITALS — BP 132/74 | HR 68 | Temp 98.0°F | Ht 72.0 in | Wt 225.0 lb

## 2019-02-06 DIAGNOSIS — G8929 Other chronic pain: Secondary | ICD-10-CM

## 2019-02-06 DIAGNOSIS — G629 Polyneuropathy, unspecified: Secondary | ICD-10-CM

## 2019-02-06 DIAGNOSIS — I10 Essential (primary) hypertension: Secondary | ICD-10-CM

## 2019-02-06 DIAGNOSIS — Z8673 Personal history of transient ischemic attack (TIA), and cerebral infarction without residual deficits: Secondary | ICD-10-CM

## 2019-02-06 DIAGNOSIS — M25561 Pain in right knee: Secondary | ICD-10-CM | POA: Insufficient documentation

## 2019-02-06 DIAGNOSIS — Z09 Encounter for follow-up examination after completed treatment for conditions other than malignant neoplasm: Secondary | ICD-10-CM

## 2019-02-06 DIAGNOSIS — R7303 Prediabetes: Secondary | ICD-10-CM

## 2019-02-06 DIAGNOSIS — E785 Hyperlipidemia, unspecified: Secondary | ICD-10-CM

## 2019-02-06 DIAGNOSIS — F419 Anxiety disorder, unspecified: Secondary | ICD-10-CM

## 2019-02-06 DIAGNOSIS — Z23 Encounter for immunization: Secondary | ICD-10-CM

## 2019-02-06 DIAGNOSIS — G4700 Insomnia, unspecified: Secondary | ICD-10-CM

## 2019-02-06 LAB — POCT URINALYSIS DIPSTICK
Bilirubin, UA: NEGATIVE
Blood, UA: NEGATIVE
Glucose, UA: NEGATIVE
Ketones, UA: NEGATIVE
Leukocytes, UA: NEGATIVE
Nitrite, UA: NEGATIVE
Protein, UA: NEGATIVE
Spec Grav, UA: >=1.03 — AB (ref 1.010–1.025)
Urobilinogen, UA: 0.2 E.U./dL
pH, UA: 6.5 (ref 5.0–8.0)

## 2019-02-06 LAB — POCT GLYCOSYLATED HEMOGLOBIN (HGB A1C): Hemoglobin A1C: 5.7 % — AB (ref 4.0–5.6)

## 2019-02-06 LAB — GLUCOSE, POCT (MANUAL RESULT ENTRY): POC Glucose: 127 mg/dl — AB (ref 70–99)

## 2019-02-06 MED ORDER — KETOROLAC TROMETHAMINE 60 MG/2ML IM SOLN
60.0000 mg | Freq: Once | INTRAMUSCULAR | Status: AC
  Administered 2019-02-06: 60 mg via INTRAMUSCULAR

## 2019-02-06 MED ORDER — TRAZODONE HCL 100 MG PO TABS: 100.0000 mg | ORAL_TABLET | Freq: Every evening | ORAL | 3 refills | Status: DC | PRN

## 2019-02-06 MED ORDER — LISINOPRIL-HYDROCHLOROTHIAZIDE 10-12.5 MG PO TABS: 1.0000 | ORAL_TABLET | Freq: Every day | ORAL | 4 refills | Status: DC

## 2019-02-06 MED FILL — traZODone HCL 100 MG TABS: 100 | 30 days supply | Qty: 30 | Fill #0

## 2019-02-06 NOTE — Progress Notes (Signed)
Patient Aaron Kennedy  Established Patient Office Visit  Subjective:  Patient ID: Aaron Kennedy, male    DOB: 03/26/1971  Age: 48 y.o. MRN: [id number]  CC:  Chief Complaint  Patient presents with  . Follow-up    HTN, DM    HPI Aaron Kennedy is a 48 year old male who presents for Follow Up today.   Past Medical History:  Diagnosis Date  . Chronic pain of right knee   . Hypertension   . Insomnia 01/2019  . Stroke [facility])    Current Status: This will be Aaron Kennedy initial office visit with me. He was previously seeing Lanae Boast, NP for his PCP needs. Since his last office visit, is doing well with no complaints. He has c/o of chronic right knee. Lately he has been been experiencing more right knee pain. We previously referred to Physical Therapy. He states that he received knee injury when he was 64 or 48 years old, but he never experienced pain until his recent Stroke. He is currently taking Gabapentin for pain and he usually gets Toradol Injections at our office for relief of pain. He has been experiencing chronic insomnia. He denies fevers, chills, fatigue, recent infections, weight loss, and night sweats. No reports of GI problems such as nausea, vomiting, diarrhea, and constipation. He has no reports of blood in stools, dysuria and hematuria. No depression or anxiety reported today.   Past Surgical History:  Procedure Laterality Date  . HERNIA REPAIR    . INCISION AND DRAINAGE ABSCESS Left 07/21/2017   Procedure: INCISION AND DRAINAGE INGUINAL HERNIA ABSCESS;  Surgeon: Rolm Bookbinder, MD;  Location: [place];  Service: General;  Laterality: Left;  . INGUINAL HERNIA REPAIR Bilateral 05/07/2017   Procedure: LAPAROSCOPIC RIGHT  INGUINAL HERNIA REPAIR AND  OPEN LEFT   INGUINAL EXPLORATION;  Surgeon: Ralene Ok, MD;  Location: [place];  Service: General;  Laterality: Bilateral;  . INSERTION OF MESH Bilateral 05/07/2017   Procedure:  INSERTION OF MESH;  Surgeon: Ralene Ok, MD;  Location: [place];  Service: General;  Laterality: Bilateral;  . LEG SURGERY      Family History  Problem Relation Age of Onset  . Stroke Brother        43s  . Heart attack Sister        45s    Social History   Socioeconomic History  . Marital status: Single    Spouse name: Not on file  . Number of children: Not on file  . Years of education: Not on file  . Highest education level: Not on file  Occupational History  . Not on file  Social Needs  . Financial resource strain: Not on file  . Food insecurity    Worry: Not on file    Inability: Not on file  . Transportation needs    Medical: Not on file    Non-medical: Not on file  Tobacco Use  . Smoking status: Current Every Day Smoker    Packs/day: 0.50    Years: 20.00    Pack years: 10.00    Types: Cigarettes  . Smokeless tobacco: Never Used  Substance and Sexual Activity  . Alcohol use: No  . Drug use: Yes    Types: Marijuana  . Sexual activity: Not on file  Lifestyle  . Physical activity    Days per week: Not on file    Minutes per session: Not on file  . Stress: Not  on file  Relationships  . Social Musician on phone: Not on file    Gets together: Not on file    Attends religious service: Not on file    Active member of club or organization: Not on file    Attends meetings of clubs or organizations: Not on file    Relationship status: Not on file  . Intimate partner violence    Fear of current or ex partner: Not on file    Emotionally abused: Not on file    Physically abused: Not on file    Forced sexual activity: Not on file  Other Topics Concern  . Not on file  Social History Narrative  . Not on file    Outpatient Medications Prior to Visit  Medication Sig Dispense Refill  . clopidogrel (PLAVIX) 75 MG tablet Take 1 tablet (75 mg total) by mouth daily. 90 tablet 3  . gabapentin (NEURONTIN) 600 MG tablet Take 2 tablets (1,200 mg total) by  mouth daily. 60 tablet 5  . metFORMIN (GLUCOPHAGE) 500 MG tablet Take 1 tablet (500 mg total) by mouth 2 (two) times daily with a meal. 180 tablet 3  . metoprolol succinate (TOPROL-XL) 25 MG 24 hr tablet Take 1 tablet (25 mg total) by mouth daily. 90 tablet 5  . lisinopril-hydrochlorothiazide (ZESTORETIC) 10-12.5 MG tablet Take 1 tablet by mouth daily. 30 tablet 0  . atorvastatin (LIPITOR) 80 MG tablet TAKE 1 TABLET BY MOUTH DAILY AT 6 PM. (Patient not taking: Reported on 02/06/2019) 30 tablet 5   No facility-administered medications prior to visit.     No Known Allergies  ROS Review of Systems  Constitutional: Negative.   HENT: Negative.   Eyes: Negative.   Respiratory: Negative.   Cardiovascular: Negative.   Gastrointestinal: Negative.   Endocrine: Negative.   Genitourinary: Negative.   Musculoskeletal:       Right leg pain  Skin: Negative.   Allergic/Immunologic: Negative.   Neurological: Positive for dizziness and headaches.  Hematological: Negative.   Psychiatric/Behavioral: Negative.       Objective:    Physical Exam  Constitutional: He is oriented to person, place, and time. He appears well-developed and well-nourished.  HENT:  Head: Normocephalic and atraumatic.  Eyes: Conjunctivae are normal.  Neck: Normal range of motion. Neck supple.  Cardiovascular: Normal rate, normal heart sounds and intact distal pulses.  Pulmonary/Chest: Effort normal and breath sounds normal.  Abdominal: Soft. Bowel sounds are normal.  Musculoskeletal: Normal range of motion.  Neurological: He is alert and oriented to person, place, and time. He has normal reflexes.  Skin: Skin is warm and dry.  Psychiatric: He has a normal mood and affect. His behavior is normal. Judgment and thought content normal.  Nursing note and vitals reviewed.   BP 132/74   Pulse 68   Temp 98 F (36.7 C) (Oral)   Ht 6' (1.829 m)   Wt 225 lb (102.1 kg)   BMI 30.52 kg/m  Wt Readings from Last 3  Encounters:  02/06/19 225 lb (102.1 kg)  05/10/18 217 lb (98.4 kg)  02/01/18 216 lb 9.6 oz (98.2 kg)     There are no preventive Kennedy reminders to display for this patient.  There are no preventive Kennedy reminders to display for this patient.  Lab Results  Component Value Date   TSH 2.31 12/24/2016   Lab Results  Component Value Date   WBC 8.4 02/01/2018   HGB 16.8 02/01/2018   HCT 49.0  02/01/2018   MCV 98 (H) 02/01/2018   PLT 209 02/01/2018   Lab Results  Component Value Date   NA 139 02/01/2018   K 4.2 02/01/2018   CO2 20 02/01/2018   GLUCOSE 95 02/01/2018   BUN 10 02/01/2018   CREATININE 0.93 02/01/2018   BILITOT 0.3 02/01/2018   ALKPHOS 125 (H) 02/01/2018   AST 22 02/01/2018   ALT 40 02/01/2018   PROT 6.8 02/01/2018   ALBUMIN 4.5 02/01/2018   CALCIUM 9.0 02/01/2018   ANIONGAP 4 (L) 07/22/2017   Lab Results  Component Value Date   CHOL 181 12/24/2016   Lab Results  Component Value Date   HDL 24 (L) 12/24/2016   Lab Results  Component Value Date   LDLCALC 125 (H) 12/24/2016   Lab Results  Component Value Date   TRIG 204 (H) 12/24/2016   Lab Results  Component Value Date   CHOLHDL 7.5 (H) 12/24/2016   Lab Results  Component Value Date   HGBA1C 5.7 (A) 02/06/2019      Assessment & Plan:   1. Chronic pain of right knee - ketorolac (TORADOL) injection 60 mg  2. Essential hypertension The current medical regimen is effective; blood pressure is stable at 132/74 today; continue present plan and medications as prescribed. He will continue to take medications as prescribed, to decrease high sodium intake, excessive alcohol intake, increase potassium intake, smoking cessation, and increase physical activity of at least 30 minutes of cardio activity daily. He will continue to follow Heart Healthy or DASH diet. - CBC with Differential - Comprehensive metabolic panel - Lipid Panel - TSH - Vitamin B12 - Vitamin D, 25-hydroxy - PSA -  lisinopril-hydrochlorothiazide (ZESTORETIC) 10-12.5 MG tablet; Take 1 tablet by mouth daily.  Dispense: 30 tablet; Refill: 4  3. Insomnia, unspecified type - traZODone (DESYREL) 100 MG tablet; Take 1 tablet (100 mg total) by mouth at bedtime as needed for sleep.  Dispense: 30 tablet; Refill: 3  4. History of CVA (cerebrovascular accident) Stable today.   5. Prediabetes Hgb A1c is stable at 5.7 today. He will continue medication as prescribed, to decrease foods/beverages high in sugars and carbs and follow Heart Healthy or DASH diet. Increase physical activity to at least 30 minutes cardio exercise daily.   - POCT glycosylated hemoglobin (Hb A1C) - POCT urinalysis dipstick - POCT glucose (manual entry) - CBC with Differential - Comprehensive metabolic panel - Lipid Panel - TSH - Vitamin B12 - Vitamin D, 25-hydroxy - PSA  6. Flu vaccine need - Flu Vaccine QUAD 36+ mos IM  7. Hyperlipidemia, unspecified hyperlipidemia type  8. Neuropathy Stable. Not worsening.   9. Anxiety Stable.   10. Follow up He will follow up in 4 months.   Meds ordered this encounter  Medications  . traZODone (DESYREL) 100 MG tablet    Sig: Take 1 tablet (100 mg total) by mouth at bedtime as needed for sleep.    Dispense:  30 tablet    Refill:  3  . lisinopril-hydrochlorothiazide (ZESTORETIC) 10-12.5 MG tablet    Sig: Take 1 tablet by mouth daily.    Dispense:  30 tablet    Refill:  4  . ketorolac (TORADOL) injection 60 mg    Orders Placed This Encounter  Procedures  . Flu Vaccine QUAD 36+ mos IM  . CBC with Differential  . Comprehensive metabolic panel  . Lipid Panel  . TSH  . Vitamin B12  . Vitamin D, 25-hydroxy  . PSA  .  POCT glycosylated hemoglobin (Hb A1C)  . POCT urinalysis dipstick  . POCT glucose (manual entry)   Referral Orders  No referral(s) requested today    Raliegh Ip,  MSN, FNP-BC [place] [facility]  [address] [place], [place] [address] [phone] fax   Problem List Items Addressed This Visit      Cardiovascular and Mediastinum   HTN (hypertension)   Relevant Medications   lisinopril-hydrochlorothiazide (ZESTORETIC) 10-12.5 MG tablet   Other Relevant Orders   CBC with Differential   Comprehensive metabolic panel   Lipid Panel   TSH   Vitamin B12   Vitamin D, 25-hydroxy   PSA     Other   Chronic pain of right knee - Primary   Relevant Medications   traZODone (DESYREL) 100 MG tablet   HLD (hyperlipidemia)   Relevant Medications   lisinopril-hydrochlorothiazide (ZESTORETIC) 10-12.5 MG tablet   Prediabetes   Relevant Orders   POCT glycosylated hemoglobin (Hb A1C) (Completed)   POCT urinalysis dipstick (Completed)   POCT glucose (manual entry) (Completed)   CBC with Differential   Comprehensive metabolic panel   Lipid Panel   TSH   Vitamin B12   Vitamin D, 25-hydroxy   PSA    Other Visit Diagnoses    Insomnia, unspecified type       Relevant Medications   traZODone (DESYREL) 100 MG tablet   History of CVA (cerebrovascular accident)       Flu vaccine need       Relevant Orders   Flu Vaccine QUAD 36+ mos IM (Completed)   Neuropathy       Anxiety       Relevant Medications   traZODone (DESYREL) 100 MG tablet   Follow up          Meds ordered this encounter  Medications  . traZODone (DESYREL) 100 MG tablet    Sig: Take 1 tablet (100 mg total) by mouth at bedtime as needed for sleep.    Dispense:  30 tablet    Refill:  3  . lisinopril-hydrochlorothiazide (ZESTORETIC) 10-12.5 MG tablet    Sig: Take 1 tablet by mouth daily.    Dispense:  30 tablet    Refill:  4  . ketorolac (TORADOL) injection 60 mg    Follow-up: No follow-ups on file.    Kallie Locks, FNP

## 2019-02-06 NOTE — Patient Instructions (Signed)
Insomnia Insomnia is a sleep disorder that makes it difficult to fall asleep or stay asleep. Insomnia can cause fatigue, low energy, difficulty concentrating, mood swings, and poor performance at work or school. There are three different ways to classify insomnia:  Difficulty falling asleep.  Difficulty staying asleep.  Waking up too early in the morning. Any type of insomnia can be long-term (chronic) or short-term (acute). Both are common. Short-term insomnia usually lasts for three months or less. Chronic insomnia occurs at least three times a week for longer than three months. What are the causes? Insomnia may be caused by another condition, situation, or substance, such as:  Anxiety.  Certain medicines.  Gastroesophageal reflux disease (GERD) or other gastrointestinal conditions.  Asthma or other breathing conditions.  Restless legs syndrome, sleep apnea, or other sleep disorders.  Chronic pain.  Menopause.  Stroke.  Abuse of alcohol, tobacco, or illegal drugs.  Mental health conditions, such as depression.  Caffeine.  Neurological disorders, such as Alzheimer's disease.  An overactive thyroid (hyperthyroidism). Sometimes, the cause of insomnia may not be known. What increases the risk? Risk factors for insomnia include:  Gender. Women are affected more often than men.  Age. Insomnia is more common as you get older.  Stress.  Lack of exercise.  Irregular work schedule or working night shifts.  Traveling between different time zones.  Certain medical and mental health conditions. What are the signs or symptoms? If you have insomnia, the main symptom is having trouble falling asleep or having trouble staying asleep. This may lead to other symptoms, such as:  Feeling fatigued or having low energy.  Feeling nervous about going to sleep.  Not feeling rested in the morning.  Having trouble concentrating.  Feeling irritable, anxious, or depressed. How  is this diagnosed? This condition may be diagnosed based on:  Your symptoms and medical history. Your health care provider may ask about: ? Your sleep habits. ? Any medical conditions you have. ? Your mental health.  A physical exam. How is this treated? Treatment for insomnia depends on the cause. Treatment may focus on treating an underlying condition that is causing insomnia. Treatment may also include:  Medicines to help you sleep.  Counseling or therapy.  Lifestyle adjustments to help you sleep better. Follow these instructions at home: Eating and drinking   Limit or avoid alcohol, caffeinated beverages, and cigarettes, especially close to bedtime. These can disrupt your sleep.  Do not eat a large meal or eat spicy foods right before bedtime. This can lead to digestive discomfort that can make it hard for you to sleep. Sleep habits   Keep a sleep diary to help you and your health care provider figure out what could be causing your insomnia. Write down: ? When you sleep. ? When you wake up during the night. ? How well you sleep. ? How rested you feel the next day. ? Any side effects of medicines you are taking. ? What you eat and drink.  Make your bedroom a dark, comfortable place where it is easy to fall asleep. ? Put up shades or blackout curtains to block light from outside. ? Use a white noise machine to block noise. ? Keep the temperature cool.  Limit screen use before bedtime. This includes: ? Watching TV. ? Using your smartphone, tablet, or computer.  Stick to a routine that includes going to bed and waking up at the same times every day and night. This can help you fall asleep faster. Consider   making a quiet activity, such as reading, part of your nighttime routine.  Try to avoid taking naps during the day so that you sleep better at night.  Get out of bed if you are still awake after 15 minutes of trying to sleep. Keep the lights down, but try reading or  doing a quiet activity. When you feel sleepy, go back to bed. General instructions  Take over-the-counter and prescription medicines only as told by your health care provider.  Exercise regularly, as told by your health care provider. Avoid exercise starting several hours before bedtime.  Use relaxation techniques to manage stress. Ask your health care provider to suggest some techniques that may work well for you. These may include: ? Breathing exercises. ? Routines to release muscle tension. ? Visualizing peaceful scenes.  Make sure that you drive carefully. Avoid driving if you feel very sleepy.  Keep all follow-up visits as told by your health care provider. This is important. Contact a health care provider if:  You are tired throughout the day.  You have trouble in your daily routine due to sleepiness.  You continue to have sleep problems, or your sleep problems get worse. Get help right away if:  You have serious thoughts about hurting yourself or someone else. If you ever feel like you may hurt yourself or others, or have thoughts about taking your own life, get help right away. You can go to your nearest emergency department or call:  Your local emergency services (911 in the [place]  A suicide crisis helpline, such as the National Suicide Prevention Lifeline at 1-800-273-8255. This is open 24 hours a day. Summary  Insomnia is a sleep disorder that makes it difficult to fall asleep or stay asleep.  Insomnia can be long-term (chronic) or short-term (acute).  Treatment for insomnia depends on the cause. Treatment may focus on treating an underlying condition that is causing insomnia.  Keep a sleep diary to help you and your health care provider figure out what could be causing your insomnia. This information is not intended to replace advice given to you by your health care provider. Make sure you discuss any questions you have with your health care provider. Document  Released: 04/02/2000 Document Revised: 03/18/2017 Document Reviewed: 01/13/2017 Elsevier Patient Education  2020 [employer] Trazodone tablets What is this medicine? TRAZODONE (TRAZ oh done) is used to treat depression. This medicine may be used for other purposes; ask your health care provider or pharmacist if you have questions. COMMON BRAND NAME(S): Desyrel What should I tell my health care provider before I take this medicine? They need to know if you have any of these conditions:  attempted suicide or thinking about it  bipolar disorder  bleeding problems  glaucoma  heart disease, or previous heart attack  irregular heart beat  kidney or liver disease  low levels of sodium in the blood  an unusual or allergic reaction to trazodone, other medicines, foods, dyes or preservatives  pregnant or trying to get pregnant  breast-feeding How should I use this medicine? Take this medicine by mouth with a glass of water. Follow the directions on the prescription label. Take this medicine shortly after a meal or a light snack. Take your medicine at regular intervals. Do not take your medicine more often than directed. Do not stop taking this medicine suddenly except upon the advice of your doctor. Stopping this medicine too quickly may cause serious side effects or your condition may worsen. A special MedGuide   will be given to you by the pharmacist with each prescription and refill. Be sure to read this information carefully each time. Talk to your pediatrician regarding the use of this medicine in children. Special care may be needed. Overdosage: If you think you have taken too much of this medicine contact a poison control center or emergency room at once. NOTE: This medicine is only for you. Do not share this medicine with others. What if I miss a dose? If you miss a dose, take it as soon as you can. If it is almost time for your next dose, take only that dose. Do not take double  or extra doses. What may interact with this medicine? Do not take this medicine with any of the following medications:  certain medicines for fungal infections like fluconazole, itraconazole, ketoconazole, posaconazole, voriconazole  cisapride  dronedarone  linezolid  MAOIs like Carbex, Eldepryl, Marplan, Nardil, and Parnate  mesoridazine  methylene blue (injected into a vein)  pimozide  saquinavir  thioridazine This medicine may also interact with the following medications:  alcohol  antiviral medicines for HIV or AIDS  aspirin and aspirin-like medicines  barbiturates like phenobarbital  certain medicines for blood pressure, heart disease, irregular heart beat  certain medicines for depression, anxiety, or psychotic disturbances  certain medicines for migraine headache like almotriptan, eletriptan, frovatriptan, naratriptan, rizatriptan, sumatriptan, zolmitriptan  certain medicines for seizures like carbamazepine and phenytoin  certain medicines for sleep  certain medicines that treat or prevent blood clots like dalteparin, enoxaparin, warfarin  digoxin  fentanyl  lithium  NSAIDS, medicines for pain and inflammation, like ibuprofen or naproxen  other medicines that prolong the QT interval (cause an abnormal heart rhythm) like dofetilide  rasagiline  supplements like St. John's wort, kava kava, valerian  tramadol  tryptophan This list may not describe all possible interactions. Give your health care provider a list of all the medicines, herbs, non-prescription drugs, or dietary supplements you use. Also tell them if you smoke, drink alcohol, or use illegal drugs. Some items may interact with your medicine. What should I watch for while using this medicine? Tell your doctor if your symptoms do not get better or if they get worse. Visit your doctor or health care professional for regular checks on your progress. Because it may take several weeks to see  the full effects of this medicine, it is important to continue your treatment as prescribed by your doctor. Patients and their families should watch out for new or worsening thoughts of suicide or depression. Also watch out for sudden changes in feelings such as feeling anxious, agitated, panicky, irritable, hostile, aggressive, impulsive, severely restless, overly excited and hyperactive, or not being able to sleep. If this happens, especially at the beginning of treatment or after a change in dose, call your health care professional. You may get drowsy or dizzy. Do not drive, use machinery, or do anything that needs mental alertness until you know how this medicine affects you. Do not stand or sit up quickly, especially if you are an older patient. This reduces the risk of dizzy or fainting spells. Alcohol may interfere with the effect of this medicine. Avoid alcoholic drinks. This medicine may cause dry eyes and blurred vision. If you wear contact lenses you may feel some discomfort. Lubricating drops may help. See your eye doctor if the problem does not go away or is severe. Your mouth may get dry. Chewing sugarless gum, sucking hard candy and drinking plenty of water may help.   Contact your doctor if the problem does not go away or is severe. What side effects may I notice from receiving this medicine? Side effects that you should report to your doctor or health care professional as soon as possible:  allergic reactions like skin rash, itching or hives, swelling of the face, lips, or tongue  elevated mood, decreased need for sleep, racing thoughts, impulsive behavior  confusion  fast, irregular heartbeat  feeling faint or lightheaded, falls  feeling agitated, angry, or irritable  loss of balance or coordination  painful or prolonged erections  restlessness, pacing, inability to keep still  suicidal thoughts or other mood changes  tremors  trouble sleeping  seizures  unusual  bleeding or bruising Side effects that usually do not require medical attention (report to your doctor or health care professional if they continue or are bothersome):  change in sex drive or performance  change in appetite or weight  constipation  headache  muscle aches or pains  nausea This list may not describe all possible side effects. Call your doctor for medical advice about side effects. You may report side effects to FDA at 1-800-FDA-1088. Where should I keep my medicine? Keep out of the reach of children. Store at room temperature between 15 and 30 degrees C (59 to 86 degrees F). Protect from light. Keep container tightly closed. Throw away any unused medicine after the expiration date. NOTE: This sheet is a summary. It may not cover all possible information. If you have questions about this medicine, talk to your doctor, pharmacist, or health care provider.  2020 Elsevier/Gold Standard (2018-03-28 11:46:46)  

## 2019-02-07 LAB — LIPID PANEL
Chol/HDL Ratio: 5.8 ratio — ABNORMAL HIGH (ref 0.0–5.0)
Cholesterol, Total: 197 mg/dL (ref 100–199)
HDL: 34 mg/dL — ABNORMAL LOW (ref >39)
LDL Chol Calc (NIH): 147 mg/dL — ABNORMAL HIGH (ref 0–99)
Triglycerides: 85 mg/dL (ref 0–149)
VLDL Cholesterol Cal: 16 mg/dL (ref 5–40)

## 2019-02-07 LAB — CBC WITH DIFFERENTIAL/PLATELET
Basophils Absolute: 0.1 10*3/uL (ref 0.0–0.2)
Basos: 1 %
EOS (ABSOLUTE): 0.4 10*3/uL (ref 0.0–0.4)
Eos: 4 %
Hematocrit: 47.8 % (ref 37.5–51.0)
Hemoglobin: 16.7 g/dL (ref 13.0–17.7)
Immature Grans (Abs): 0 10*3/uL (ref 0.0–0.1)
Immature Granulocytes: 0 %
Lymphocytes Absolute: 3 10*3/uL (ref 0.7–3.1)
Lymphs: 35 %
MCH: 34.8 pg — ABNORMAL HIGH (ref 26.6–33.0)
MCHC: 34.9 g/dL (ref 31.5–35.7)
MCV: 100 fL — ABNORMAL HIGH (ref 79–97)
Monocytes Absolute: 0.7 10*3/uL (ref 0.1–0.9)
Monocytes: 8 %
Neutrophils Absolute: 4.4 10*3/uL (ref 1.4–7.0)
Neutrophils: 52 %
Platelets: 191 10*3/uL (ref 150–450)
RBC: 4.8 x10E6/uL (ref 4.14–5.80)
RDW: 13.1 % (ref 11.6–15.4)
WBC: 8.5 10*3/uL (ref 3.4–10.8)

## 2019-02-07 LAB — TSH: TSH: 1.66 u[IU]/mL (ref 0.450–4.500)

## 2019-02-07 LAB — COMPREHENSIVE METABOLIC PANEL
ALT: 36 IU/L (ref 0–44)
AST: 20 IU/L (ref 0–40)
Albumin/Globulin Ratio: 2 (ref 1.2–2.2)
Albumin: 4.4 g/dL (ref 4.0–5.0)
Alkaline Phosphatase: 119 IU/L — ABNORMAL HIGH (ref 39–117)
BUN/Creatinine Ratio: 10 (ref 9–20)
BUN: 9 mg/dL (ref 6–24)
Bilirubin Total: 0.3 mg/dL (ref 0.0–1.2)
CO2: 22 mmol/L (ref 20–29)
Calcium: 9.1 mg/dL (ref 8.7–10.2)
Chloride: 104 mmol/L (ref 96–106)
Creatinine, Ser: 0.94 mg/dL (ref 0.76–1.27)
GFR calc Af Amer: 110 mL/min/{1.73_m2} (ref >59)
GFR calc non Af Amer: 95 mL/min/{1.73_m2} (ref >59)
Globulin, Total: 2.2 g/dL (ref 1.5–4.5)
Glucose: 101 mg/dL — ABNORMAL HIGH (ref 65–99)
Potassium: 4.4 mmol/L (ref 3.5–5.2)
Sodium: 138 mmol/L (ref 134–144)
Total Protein: 6.6 g/dL (ref 6.0–8.5)

## 2019-02-07 LAB — VITAMIN D 25 HYDROXY (VIT D DEFICIENCY, FRACTURES): Vit D, 25-Hydroxy: 34.1 ng/mL (ref 30.0–100.0)

## 2019-02-07 LAB — VITAMIN B12: Vitamin B-12: 505 pg/mL (ref 232–1245)

## 2019-02-07 LAB — PSA: Prostate Specific Ag, Serum: 0.9 ng/mL (ref 0.0–4.0)

## 2019-02-09 ENCOUNTER — Other Ambulatory Visit: Payer: Self-pay | Admitting: Family Medicine

## 2019-02-09 DIAGNOSIS — G8929 Other chronic pain: Secondary | ICD-10-CM

## 2019-02-09 DIAGNOSIS — M25561 Pain in right knee: Secondary | ICD-10-CM

## 2019-02-15 MED FILL — traZODone HCL 100 MG TABS: 100 | 30 days supply | Qty: 30 | Fill #0

## 2019-02-22 ENCOUNTER — Ambulatory Visit: Payer: Self-pay | Attending: Family Medicine | Admitting: Physical Therapy

## 2019-02-22 ENCOUNTER — Encounter: Payer: Self-pay | Admitting: Physical Therapy

## 2019-02-22 ENCOUNTER — Other Ambulatory Visit: Payer: Self-pay

## 2019-02-22 DIAGNOSIS — M6281 Muscle weakness (generalized): Secondary | ICD-10-CM | POA: Insufficient documentation

## 2019-02-22 DIAGNOSIS — G8929 Other chronic pain: Secondary | ICD-10-CM | POA: Insufficient documentation

## 2019-02-22 DIAGNOSIS — M25561 Pain in right knee: Secondary | ICD-10-CM | POA: Insufficient documentation

## 2019-02-22 NOTE — Patient Instructions (Signed)
Access Code: [email or web address]  URL: [email or web address]  Date: 02/22/2019  Prepared by: Hilda Blades   Exercises Supine Hamstring Stretch with Strap - 3 reps - 30-60 seconds hold - 3-4x daily Seated Hamstring Stretch - 3 reps - 30-60 seconds hold - 3-4x daily Butterfly Groin Stretch - 3 reps - 30-60 seconds hold - 3-4x daily Side Lunge Adductor Stretch - 3 reps - 30-60 seconds hold - 3-4x daily Prone Quadriceps Stretch with Strap - 3 reps - 30-60 seconds hold - 3-4x daily Gastroc Stretch on Wall - 3 reps - 30-60 seconds hold - 3-4x daily

## 2019-02-22 NOTE — Therapy (Signed)
[facility] [address] [place], [place], [address] Phone: [phone]   Fax:  [phone]  Physical Therapy Evaluation  Patient Details  Name: Aaron Kennedy MRN: [id number] Date of Birth: 1970-10-21 Referring Provider (PT): Kallie LocksStroud, Natalie M, FNP   Encounter Date: 02/22/2019  PT End of Session - 02/22/19 0829    Visit Number  1    Number of Visits  12    Date for PT Re-Evaluation  04/05/19    Authorization Type  Self Pay    PT Start Time  0830    PT Stop Time  0915    PT Time Calculation (min)  45 min    Activity Tolerance  Patient tolerated treatment well    Behavior During Therapy  [facility] for tasks assessed/performed       Past Medical History:  Diagnosis Date  . Chronic pain of right knee   . Hypertension   . Insomnia 01/2019  . Stroke [facility](HCC)     Past Surgical History:  Procedure Laterality Date  . HERNIA REPAIR    . INCISION AND DRAINAGE ABSCESS Left 07/21/2017   Procedure: INCISION AND DRAINAGE INGUINAL HERNIA ABSCESS;  Surgeon: Emelia LoronWakefield, Matthew, MD;  [facility];  Service: General;  Laterality: Left;  . INGUINAL HERNIA REPAIR Bilateral 05/07/2017   Procedure: LAPAROSCOPIC RIGHT  INGUINAL HERNIA REPAIR AND  OPEN LEFT   INGUINAL EXPLORATION;  Surgeon: Axel Filleramirez, Armando, MD;  Location: [facility] OR;  Service: General;  Laterality: Bilateral;  . INSERTION OF MESH Bilateral 05/07/2017   Procedure: INSERTION OF MESH;  Surgeon: Axel Filleramirez, Armando, MD;  [facility];  Service: General;  Laterality: Bilateral;  . LEG SURGERY      There were no vitals filed for this visit.   Subjective Assessment - 02/22/19 0828    Subjective  Patient reports right knee pain and popping on the inside of the knee. He had a stroke in 2016 and as a result has numbness and tingling on the right side of his body. His knee pain started worsening after the stroke, and he has this popping on the inside of the knee. He did have knee pain prior to  the stroke but it did not limit his activity.    Pertinent History  HTN, DM, CVA in 2016, Insomnia, Neuropathy, Anxiety, previous left femur in 2012    Limitations  Walking;House hold activities    How long can you sit comfortably?  No trouble    How long can you stand comfortably?  No trouble    How long can you walk comfortably?  Knee pain with all walking    Diagnostic tests  X-ray    Patient Stated Goals  Patient would like to reduce pain with bending knee so he can increase activity level.    Currently in Pain?  Yes    Pain Score  8     Pain Location  Knee    Pain Orientation  Right    Pain Descriptors / Indicators  Sharp    Pain Type  Chronic pain    Pain Onset  More than a month ago    Pain Frequency  Intermittent    Aggravating Factors   Bending knee causes popping on medial knee    Pain Relieving Factors  Ice, topical analgesics, epsom salt baths    Effect of Pain on Daily Activities  Patient is limited in activity due to knee pain    Multiple Pain Sites  No  [facility] PT Assessment - 02/22/19 0001      Assessment   Medical Diagnosis  Chronic right knee pain    Referring Provider (PT)  Kallie LocksStroud, Natalie M, FNP    Onset Date/Surgical Date  --   Patient reports right knee pain for many years   Next MD Visit  06/08/2019    Prior Therapy  None      Precautions   Precautions  None      Restrictions   Weight Bearing Restrictions  No      Balance Screen   Has the patient fallen in the past 6 months  No    Has the patient had a decrease in activity level because of a fear of falling?   No    Is the patient reluctant to leave their home because of a fear of falling?   No      Home Public house managernvironment   Living Environment  Private residence      Prior Function   Level of Independence  Independent    Vocation  Part time employment    Vocation Requirements  He works with repairs on vinyl siding and windows    Leisure  None reported      Cognition   Overall Cognitive Status   Within Functional Limits for tasks assessed      Observation/Other Assessments   Observations  Patient exhibits visible tendon snapping over prominence on pes anserine area at approximately 30-40 deg knee flexion    Focus on Therapeutic Outcomes (FOTO)   53% limited      Sensation   Light Touch  Impaired by gross assessment   Secondary to stroke     Posture/Postural Control   Posture/Postural Control  No significant limitations      ROM / Strength   AROM / PROM / Strength  AROM;Strength      AROM   Overall AROM Comments  All hip, knee, ankle joint ROM WFL    AROM Assessment Site  Knee    Right/Left Knee  Right;Left    Right Knee Extension  0    Right Knee Flexion  140    Left Knee Extension  0    Left Knee Flexion  140      Strength   Overall Strength Comments  Weakness greater on right side secondary to stroke    Strength Assessment Site  Knee;Hip;Ankle    Right/Left Hip  Right;Left    Right Hip Flexion  4/5    Right Hip Extension  4-/5    Right Hip ABduction  3+/5    Left Hip Flexion  4/5    Left Hip Extension  4/5    Left Hip ABduction  4-/5    Right/Left Knee  Right;Left    Right Knee Flexion  4+/5    Right Knee Extension  4+/5    Left Knee Flexion  5/5    Left Knee Extension  5/5    Right/Left Ankle  Left;Right    Right Ankle Dorsiflexion  4/5    Right Ankle Plantar Flexion  4/5    Left Ankle Dorsiflexion  5/5    Left Ankle Plantar Flexion  5/5      Flexibility   Soft Tissue Assessment /Muscle Length  yes    Hamstrings  Limited on left    Quadriceps Adductors Gastroc  Limited on left   Limited on left  Limited on left     Palpation   Palpation comment  Mildly tender  over right pes anserine area      Transfers   Transfers  Independent with all Transfers      Ambulation/Gait   Ambulation/Gait  Yes    Ambulation/Gait Assistance  7: Independent    Gait Comments  Mildly antalgic on right with decreased knee flexion in order to avoid tendon snapping of  medial right knee                Objective measurements completed on examination: See above findings.      [facility] Adult PT Treatment/Exercise - 02/22/19 0001      Exercises   Exercises  Knee/Hip      Knee/Hip Exercises: Stretches   Passive Hamstring Stretch  3 reps;30 seconds    Passive Hamstring Stretch Limitations  Supine with strap and seated on edge of table    Quad Stretch  3 reps;30 seconds    Quad Stretch Limitations  Prone with strap    Gastroc Stretch  3 reps;30 seconds    Gastroc Stretch Limitations  Standing    Other Knee/Hip Stretches  Adductor stretch in butterfly and lateral lunge position 3x30 sec each             PT Education - 02/22/19 0829    Education Details  Exam findings, POC, HEP and importance of stretching, activity modification and modalities for pain relief    Person(s) Educated  Patient    Methods  Explanation;Demonstration;Verbal cues;Handout    Comprehension  Verbalized understanding;Verbal cues required;Need further instruction       PT Short Term Goals - 02/22/19 1605      PT SHORT TERM GOAL #1   Title  Patient will demonstrate independency with HEP to maintain progress in PT.    Baseline  Given exercises at eval    Time  3    Period  Weeks    Status  New    Target Date  03/15/19      PT SHORT TERM GOAL #2   Title  Patient will report improvement in pain level no more than 4-5/10 with knee bending to improve walking ability.    Baseline  8/10    Time  3    Period  Weeks    Status  New    Target Date  03/15/19        PT Long Term Goals - 02/22/19 1607      PT LONG TERM GOAL #1   Title  Patient will report pain level no more than 2-3/10 with activities such as walking and jogging to allow for return to PLOF.    Baseline  8/10    Time  6    Period  Weeks    Status  New    Target Date  04/05/19      PT LONG TERM GOAL #2   Title  Patient will exhibit improve strength of to grossly > or = 4+/5 bilat LEs to allow  for improve ability to return to jogging and basketball.    Baseline  See strength testing    Time  6    Period  Weeks    Status  New    Target Date  04/05/19      PT LONG TERM GOAL #3   Title  Patient will demonstrate improvement in hamstring, quad, and adductor flexibility to [facility] to reduce tension over pes anserine and allow for improve walking ability with less pain.    Baseline  See flexibility  Time  6    Period  Weeks    Status  New    Target Date  04/05/19      PT LONG TERM GOAL #4   Title  Patient will demonstrate improve functional ability by FOTO < or = 39% limitation.    Baseline  53% limitation    Time  6    Period  Weeks    Status  New    Target Date  04/05/19             Plan - 02/22/19 2706    Clinical Impression Statement  Patient presents to physical therapy with report of chronic right knee pain. His pain seems consistent with pes anserine bursitis/tendonitis with a visible snapping of the tendons. He did exhibit significant tightness of the hamstring, adductors, and quads on the right so he was provided with stretches to improve flexibility to reduce tension over the pes anserine. He would benefit from continued skilled PT to reduce his knee pain and allow for return to PLOF.    Personal Factors and Comorbidities  Comorbidity 3+;Past/Current Experience;Social Background;Time since onset of injury/illness/exacerbation    Comorbidities  HTN, DM, CVA, Insomnia, Neuropathy, Anxiety, Smoking    Examination-Activity Limitations  Bend;Locomotion Level;Stairs;Squat;Sleep    Examination-Participation Restrictions  Community Activity    Stability/Clinical Decision Making  Evolving/Moderate complexity    Clinical Decision Making  Moderate    Rehab Potential  Good    PT Frequency  1x / week    PT Duration  6 weeks    PT Treatment/Interventions  ADLs/Self Care Home Management;Cryotherapy;Electrical Stimulation;Iontophoresis 4mg /ml Dexamethasone;Moist  Heat;Ultrasound;Therapeutic activities;Functional mobility training;Therapeutic exercise;Neuromuscular re-education;Balance training;Patient/family education;Manual techniques;Dry needling;Passive range of motion;Taping;Spinal Manipulations;Vasopneumatic Device;Joint Manipulations    PT Next Visit Plan  assess HEP, progress stretching, initiate LE strengthening    PT Home Exercise Plan  Supine hamstring stretch with strap, seated hamstring stretch, butterfly adductor stretch, lateral lunge adductor stretch, prone quad stretch with strap, standing gastroc stretch    Consulted and Agree with Plan of Care  Patient       Patient will benefit from skilled therapeutic intervention in order to improve the following deficits and impairments:  Difficulty walking, Pain, Decreased strength, Impaired flexibility  Visit Diagnosis: Chronic pain of right knee  Muscle weakness (generalized)     Problem List Patient Active Problem List   Diagnosis Date Noted  . Soft tissue abscess of inguinal region 07/21/2017  . Abscess 06/04/2017  . Recurrent inguinal hernia 05/07/2017  . S/P hernia repair 05/07/2017  . Chronic pain of right knee 04/22/2016  . PAD (peripheral artery disease) (HCC) 05/02/2015  . Stroke (cerebrum) (HCC) 04/08/2015  . 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 CVA (cerebral vascular accident) (HCC) 04/03/2015  . Seizures (HCC) 04/03/2015  . Stroke (HCC) 03/22/2015  . HTN (hypertension) 03/22/2015  . HLD (hyperlipidemia) 03/22/2015  . Prediabetes 03/22/2015  . Tobacco use disorder 03/22/2015  . [name] (transient ischemic attack) 03/18/2015  . History of fracture of ankle   . Right ankle pain     03/20/2015, PT, DPT, LAT, ATC 02/22/19  4:15 PM Phone: [phone] Fax: [phone]   [facility] [address] [place], [place], [place] Phone: [phone]   Fax:  [phone]  Name: Aaron Kennedy MRN: Aaron Kennedy Date of Birth: 1970/07/07

## 2019-02-27 ENCOUNTER — Other Ambulatory Visit: Payer: Self-pay

## 2019-02-27 ENCOUNTER — Encounter: Payer: Self-pay | Admitting: Physical Therapy

## 2019-02-27 ENCOUNTER — Ambulatory Visit: Payer: Self-pay | Admitting: Physical Therapy

## 2019-02-27 DIAGNOSIS — G8929 Other chronic pain: Secondary | ICD-10-CM

## 2019-02-27 DIAGNOSIS — M6281 Muscle weakness (generalized): Secondary | ICD-10-CM

## 2019-02-27 NOTE — Patient Instructions (Signed)
Access Code: [email or web address]  URL: [email or web address]  Date: 02/27/2019  Prepared by: Hilda Blades   Exercises Clamshell with Resistance - 15 reps - 2 sets - 1x daily Supine Bridge with Resistance Band - 10 reps - 2 sets - 1x daily Banded Row - 15 reps - 2 sets - 1x daily Standing Shoulder Horizontal Abduction with Resistance - 10 reps - 2 sets - 1x daily Supine Quadriceps Stretch with Strap on Table - 2-3 reps - 30-60 seconds hold - 2-3x daily Supine ITB Stretch with Strap Hip Adductors and Hamstring Stretch with Strap

## 2019-02-27 NOTE — Therapy (Addendum)
[place], [place], [address] Phone: [phone]   Fax:  [phone]  Physical Therapy Treatment  Patient Details  Name: Aaron Kennedy MRN: [id number] Date of Birth: 15-Nov-1970 Referring Provider (PT): Azzie Glatter, FNP   Encounter Date: 02/27/2019  PT End of Session - 02/27/19 1545    Visit Number  2    Number of Visits  12    Date for PT Re-Evaluation  04/05/19    Authorization Type  Self Pay    PT Start Time  1537    PT Stop Time  1615    PT Time Calculation (min)  38 min    Activity Tolerance  Patient tolerated treatment well    Behavior During Therapy  [facility] for tasks assessed/performed       Past Medical History:  Diagnosis Date  . Chronic pain of right knee   . Hypertension   . Insomnia 01/2019  . Stroke [facility])     Past Surgical History:  Procedure Laterality Date  . HERNIA REPAIR    . INCISION AND DRAINAGE ABSCESS Left 07/21/2017   Procedure: INCISION AND DRAINAGE INGUINAL HERNIA ABSCESS;  Surgeon: Rolm Bookbinder, MD;  Location: [place];  Service: General;  Laterality: Left;  . INGUINAL HERNIA REPAIR Bilateral 05/07/2017   Procedure: LAPAROSCOPIC RIGHT  INGUINAL HERNIA REPAIR AND  OPEN LEFT   INGUINAL EXPLORATION;  Surgeon: Ralene Ok, MD;  Location: [place];  Service: General;  Laterality: Bilateral;  . INSERTION OF MESH Bilateral 05/07/2017   Procedure: INSERTION OF MESH;  Surgeon: Ralene Ok, MD;  Location: [place];  Service: General;  Laterality: Bilateral;  . LEG SURGERY      There were no vitals filed for this visit.  Subjective Assessment - 02/27/19 1542    Subjective  Patient reports the pain is almost gone from the stretching. He does his stretches 3x/day. He notes he feels a lot better at night.    Currently in Pain?  Yes    Pain Score  3     Pain Location  Knee    Pain Orientation  Right    Pain Descriptors / Indicators  Sharp    Pain Type  Chronic pain     Pain Onset  More than a month ago    Pain Frequency  Intermittent         [facility] PT Assessment - 02/27/19 0001      Flexibility   Soft Tissue Assessment /Muscle Length  yes    Hamstrings  Patient exhibits improved hamstring mobility                   [facility] Adult PT Treatment/Exercise - 02/27/19 0001      Exercises   Exercises  Knee/Hip      Knee/Hip Exercises: Stretches   Passive Hamstring Stretch  30 seconds    Passive Hamstring Stretch Limitations  Supine with strap (3-way)    Quad Stretch  30 seconds    Quad Stretch Limitations  Prone with strap    Hip Flexor Stretch  2 reps;30 seconds    Hip Flexor Stretch Limitations  supine on edge of table in thomas position with strap    Gastroc Stretch  30 seconds    Gastroc Stretch Limitations  Standing and slant board    Other Knee/Hip Stretches  Adductor stretch in lateral lunge position x30 sec      Knee/Hip Exercises: Standing   Other Standing Knee Exercises  Row and horizontal abduction with blue band 2x15 each      Knee/Hip Exercises: Supine   Bridges  2 sets;10 reps    Bridges Limitations  red band      Knee/Hip Exercises: Sidelying   Clams  2x10 with red band             PT Education - 02/27/19 1544    Education Details  HEP    Person(s) Educated  Patient    Methods  Explanation;Demonstration;Verbal cues    Comprehension  Verbalized understanding;Verbal cues required;Need further instruction       PT Short Term Goals - 02/22/19 1605      PT SHORT TERM GOAL #1   Title  Patient will demonstrate independency with HEP to maintain progress in PT.    Baseline  Given exercises at eval    Time  3    Period  Weeks    Status  New    Target Date  03/15/19      PT SHORT TERM GOAL #2   Title  Patient will report improvement in pain level no more than 4-5/10 with knee bending to improve walking ability.    Baseline  8/10    Time  3    Period  Weeks    Status  New    Target Date  03/15/19         PT Long Term Goals - 02/22/19 1607      PT LONG TERM GOAL #1   Title  Patient will report pain level no more than 2-3/10 with activities such as walking and jogging to allow for return to PLOF.    Baseline  8/10    Time  6    Period  Weeks    Status  New    Target Date  04/05/19      PT LONG TERM GOAL #2   Title  Patient will exhibit improve strength of to grossly > or = 4+/5 bilat LEs to allow for improve ability to return to jogging and basketball.    Baseline  See strength testing    Time  6    Period  Weeks    Status  New    Target Date  04/05/19      PT LONG TERM GOAL #3   Title  Patient will demonstrate improvement in hamstring, quad, and adductor flexibility to [facility] to reduce tension over pes anserine and allow for improve walking ability with less pain.    Baseline  See flexibility    Time  6    Period  Weeks    Status  New    Target Date  04/05/19      PT LONG TERM GOAL #4   Title  Patient will demonstrate improve functional ability by FOTO < or = 39% limitation.    Baseline  53% limitation    Time  6    Period  Weeks    Status  New    Target Date  04/05/19            Plan - 02/27/19 1615    Clinical Impression Statement  Patient seems to be improving with the stretching program and did well with the addition of posterior chain strengthening this visit. He continues to exhibit muscular tightness and this visit brought up his difficulty with heel raises so that will be addressed at future visit. He would benefit from continued skilled PT to improve his flexibility and strength to reduce  stress and pain over the medial knee.    PT Treatment/Interventions  ADLs/Self Care Home Management;Cryotherapy;Electrical Stimulation;Iontophoresis 29m/ml Dexamethasone;Moist Heat;Ultrasound;Therapeutic activities;Functional mobility training;Therapeutic exercise;Neuromuscular re-education;Balance training;Patient/family education;Manual techniques;Dry needling;Passive range  of motion;Taping;Spinal Manipulations;Vasopneumatic Device;Joint Manipulations    PT Next Visit Plan  assess HEP, progress strengthening - calf, strengthening machines    PT Home Exercise Plan  Supine hamstring stretch with strap (3-way), seated hamstring stretch, butterfly adductor stretch, lateral lunge adductor stretch, prone quad stretch with strap, supine hip flexor/quad stretch in thomas with strap, standing gastroc stretch; clamshell and bridge with red band, row and horiz abduction with blue band    Consulted and Agree with Plan of Care  Patient       Patient will benefit from skilled therapeutic intervention in order to improve the following deficits and impairments:  Difficulty walking, Pain, Decreased strength, Impaired flexibility  Visit Diagnosis: Chronic pain of right knee  Muscle weakness (generalized)     Problem List Patient Active Problem List   Diagnosis Date Noted  . Soft tissue abscess of inguinal region 07/21/2017  . Abscess 06/04/2017  . Recurrent inguinal hernia 05/07/2017  . S/P hernia repair 05/07/2017  . Chronic pain of right knee 04/22/2016  . PAD (peripheral artery disease) ([facility] 05/02/2015  . Stroke ([place]rebrum) ([place] [place] 04/08/2015  . 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 [place] ([place] vascular accident) ([place] 04/03/2015  . Seizures ([place] 04/03/2015  . Stroke ([facility] 03/22/2015  . HTN (hypertension) 03/22/2015  . HLD (hyperlipidemia) 03/22/2015  . Prediabetes 03/22/2015  . Tobacco use disorder 03/22/2015  . [name] (transient ischemic attack) 03/18/2015  . History of fracture of ankle   . Right ankle pain     CHilda Blades PT, DPT, LAT, ATC 02/27/19  4:27 PM Phone: [phone]: 3[facility]GHouston [place] [address]Phone: [phone]  Fax:  [phone] Name: Aaron DESANTISMRN: [id number] of Birth: 3January 08, 1972     PHYSICAL THERAPY DISCHARGE SUMMARY  Visits from Start of Care: 2  Current functional level related to goals / functional outcomes: Unknown as patient did not return to therapy, he reported improved pain but continued to exhibit tightness and popping of the knee    Remaining deficits: Muscular tightness, strength deficit    Education / Equipment: HEP, patient given bands Plan:                                                    Patient goals were not met. Patient is being discharged due to not returning since the last visit.  ?????

## 2019-02-28 MED FILL — ?METOPROLOL SUCC ER 25MG TA: 25 | 30 days supply | Qty: 30 | Fill #4

## 2019-02-28 MED FILL — GABAPENTIN 600 MG TABLET: 600 | 30 days supply | Qty: 60 | Fill #4

## 2019-02-28 MED FILL — LISINOPRIL-HCTZ 10-12.5 MG: 10-12.5 | 30 days supply | Qty: 30 | Fill #5

## 2019-02-28 MED FILL — CLOPIDOGREL 75 MG TABLET: 75 | 30 days supply | Qty: 30 | Fill #4

## 2019-03-05 ENCOUNTER — Ambulatory Visit: Payer: Self-pay | Admitting: Physical Therapy

## 2019-03-12 ENCOUNTER — Ambulatory Visit: Payer: Self-pay | Admitting: Physical Therapy

## 2019-03-19 ENCOUNTER — Telehealth: Payer: Self-pay | Admitting: Physical Therapy

## 2019-03-19 ENCOUNTER — Ambulatory Visit: Payer: Self-pay | Admitting: Physical Therapy

## 2019-03-19 NOTE — Telephone Encounter (Signed)
Patient called because he missed his appointment this morning. He was reminded of the attendance policy and his appointment for next week (03/26/2019) was rescheduled to the afternoon so the patient would be able to attend. He understood that if he missed his next appointment then he would be discharged from therapy per attendance policy.

## 2019-03-26 ENCOUNTER — Ambulatory Visit: Payer: Self-pay | Admitting: Physical Therapy

## 2019-03-26 ENCOUNTER — Ambulatory Visit: Payer: Self-pay | Attending: Family Medicine | Admitting: Physical Therapy

## 2019-04-02 MED FILL — GABAPENTIN 600 MG TABLET: 600 | 30 days supply | Qty: 60 | Fill #5

## 2019-04-02 MED FILL — LISINOPRIL-HCTZ 10-12.5 MG: 10-12.5 | 30 days supply | Qty: 30 | Fill #6

## 2019-04-02 MED FILL — CLOPIDOGREL 75 MG TABLET: 75 | 30 days supply | Qty: 30 | Fill #5

## 2019-04-02 MED FILL — ?METOPROLOL SUCC ER 25MG TA: 25 | 30 days supply | Qty: 30 | Fill #5

## 2019-05-04 MED FILL — METOPROLOL SUCCINATE ER 25: 25 | 30 days supply | Qty: 30 | Fill #6

## 2019-05-04 MED FILL — GABAPENTIN 600 MG TABLET: 600 | 30 days supply | Qty: 60 | Fill #3

## 2019-05-04 MED FILL — CLOPIDOGREL 75 MG TABLET: 75 | 30 days supply | Qty: 30 | Fill #6

## 2019-05-04 MED FILL — LISINOPRIL-HCTZ 10-12.5 MG: 10-12.5 | 30 days supply | Qty: 30 | Fill #7

## 2019-06-08 ENCOUNTER — Ambulatory Visit: Payer: Self-pay | Admitting: Family Medicine

## 2019-06-14 ENCOUNTER — Other Ambulatory Visit: Payer: Self-pay | Admitting: Family Medicine

## 2019-06-14 DIAGNOSIS — G629 Polyneuropathy, unspecified: Secondary | ICD-10-CM

## 2019-06-14 MED FILL — METOPROLOL SUCCINATE ER 25: 25 | 30 days supply | Qty: 30 | Fill #7

## 2019-06-14 MED FILL — CLOPIDOGREL 75 MG TABLET: 75 | 30 days supply | Qty: 30 | Fill #7

## 2019-06-14 MED FILL — GABAPENTIN 600 MG TABLET: 600 | 30 days supply | Qty: 60 | Fill #0

## 2019-06-14 MED FILL — LISINOPRIL-HCTZ 10-12.5 MG: 10-12.5 | 30 days supply | Qty: 30 | Fill #8

## 2019-06-18 ENCOUNTER — Telehealth: Payer: Self-pay | Admitting: Family Medicine

## 2019-06-18 NOTE — Telephone Encounter (Signed)
Pt called and reminded of appointments

## 2019-06-19 ENCOUNTER — Other Ambulatory Visit: Payer: Self-pay

## 2019-06-19 ENCOUNTER — Ambulatory Visit (INDEPENDENT_AMBULATORY_CARE_PROVIDER_SITE_OTHER): Payer: Self-pay | Admitting: Family Medicine

## 2019-06-19 ENCOUNTER — Encounter: Payer: Self-pay | Admitting: Family Medicine

## 2019-06-19 VITALS — BP 113/74 | HR 69 | Temp 98.2°F | Ht 72.0 in | Wt 231.6 lb

## 2019-06-19 DIAGNOSIS — G4700 Insomnia, unspecified: Secondary | ICD-10-CM

## 2019-06-19 DIAGNOSIS — F419 Anxiety disorder, unspecified: Secondary | ICD-10-CM

## 2019-06-19 DIAGNOSIS — I10 Essential (primary) hypertension: Secondary | ICD-10-CM

## 2019-06-19 DIAGNOSIS — Z09 Encounter for follow-up examination after completed treatment for conditions other than malignant neoplasm: Secondary | ICD-10-CM

## 2019-06-19 LAB — POCT URINALYSIS DIPSTICK
Bilirubin, UA: NEGATIVE
Blood, UA: NEGATIVE
Glucose, UA: NEGATIVE
Ketones, UA: NEGATIVE
Leukocytes, UA: NEGATIVE
Nitrite, UA: NEGATIVE
Protein, UA: NEGATIVE
Spec Grav, UA: 1.025 (ref 1.010–1.025)
Urobilinogen, UA: 0.2 E.U./dL
pH, UA: 5.5 (ref 5.0–8.0)

## 2019-06-19 MED ORDER — DIPHENHYDRAMINE HCL 50 MG PO TABS: 50.0000 mg | ORAL_TABLET | Freq: Every evening | ORAL | 6 refills | Status: DC | PRN

## 2019-06-19 NOTE — Patient Instructions (Signed)
Diphenhydramine capsules or tablets What is this medicine? DIPHENHYDRAMINE (dye fen HYE dra meen) is an antihistamine. It is used to treat the symptoms of an allergic reaction. It is also used to treat Parkinson's disease. This medicine is also used to prevent and to treat motion sickness and as a nighttime sleep aid. This medicine may be used for other purposes; ask your health care provider or pharmacist if you have questions. COMMON BRAND NAME(S): Alka-Seltzer Plus Allergy, Aller-G-Time, Banophen, Benadryl Allergy, Benadryl Allergy Dye Free, Benadryl Allergy Kapgel, Benadryl Allergy Ultratab, Diphedryl, Diphenhist, Genahist, Geri-Dryl, PHARBEDRYL, Q-Dryl, Gretta Began, Valu-Dryl, Vicks ZzzQuil Nightime Sleep-Aid What should I tell my health care provider before I take this medicine? They need to know if you have any of these conditions:  asthma or lung disease  glaucoma  high blood pressure or heart disease  liver disease  pain or difficulty passing urine  prostate trouble  ulcers or other stomach problems  an unusual or allergic reaction to diphenhydramine, other medicines foods, dyes, or preservatives such as sulfites  pregnant or trying to get pregnant  breast-feeding How should I use this medicine? Take this medicine by mouth with a full glass of water. Follow the directions on the prescription label. Take your doses at regular intervals. Do not take your medicine more often than directed. To prevent motion sickness start taking this medicine 30 to 60 minutes before you leave. Talk to your pediatrician regarding the use of this medicine in children. Special care may be needed. Patients over 76 years old may have a stronger reaction and need a smaller dose. Overdosage: If you think you have taken too much of this medicine contact a poison control center or emergency room at once. NOTE: This medicine is only for you. Do not share this medicine with others. What if I miss a  dose? If you miss a dose, take it as soon as you can. If it is almost time for your next dose, take only that dose. Do not take double or extra doses. What may interact with this medicine? Do not take this medicine with any of the following medications:  MAOIs like Carbex, Eldepryl, Marplan, Nardil, and Parnate This medicine may also interact with the following medications:  alcohol  barbiturates, like phenobarbital  medicines for bladder spasm like oxybutynin, tolterodine  medicines for blood pressure  medicines for depression, anxiety, or psychotic disturbances  medicines for movement abnormalities or Parkinson's disease  medicines for sleep  other medicines for cold, cough or allergy  some medicines for the stomach like chlordiazepoxide, dicyclomine This list may not describe all possible interactions. Give your health care provider a list of all the medicines, herbs, non-prescription drugs, or dietary supplements you use. Also tell them if you smoke, drink alcohol, or use illegal drugs. Some items may interact with your medicine. What should I watch for while using this medicine? Visit your doctor or health care professional for regular check ups. Tell your doctor if your symptoms do not improve or if they get worse. Your mouth may get dry. Chewing sugarless gum or sucking hard candy, and drinking plenty of water may help. Contact your doctor if the problem does not go away or is severe. This medicine may cause dry eyes and blurred vision. If you wear contact lenses you may feel some discomfort. Lubricating drops may help. See your eye doctor if the problem does not go away or is severe. You may get drowsy or dizzy. Do not drive, use machinery, or  do anything that needs mental alertness until you know how this medicine affects you. Do not stand or sit up quickly, especially if you are an older patient. This reduces the risk of dizzy or fainting spells. Alcohol may interfere with the  effect of this medicine. Avoid alcoholic drinks. What side effects may I notice from receiving this medicine? Side effects that you should report to your doctor or health care professional as soon as possible:  allergic reactions like skin rash, itching or hives, swelling of the face, lips, or tongue  changes in vision  confused, agitated, nervous  irregular or fast heartbeat  tremor  trouble passing urine  unusual bleeding or bruising  unusually weak or tired Side effects that usually do not require medical attention (report to your doctor or health care professional if they continue or are bothersome):  constipation, diarrhea  drowsy  headache  loss of appetite  stomach upset, vomiting  thick mucous This list may not describe all possible side effects. Call your doctor for medical advice about side effects. You may report side effects to FDA at 1-800-FDA-1088. Where should I keep my medicine? Keep out of the reach of children. This medicine can be abused. Keep your medicine in a safe place. Store at room temperature between 15 and 30 degrees C (59 and 86 degrees F). Keep container closed tightly. Throw away any unused medicine after the expiration date. NOTE: This sheet is a summary. It may not cover all possible information. If you have questions about this medicine, talk to your doctor, pharmacist, or health care provider.  2020 Elsevier/Gold Standard (2019-01-12 10:18:35) Insomnia Insomnia is a sleep disorder that makes it difficult to fall asleep or stay asleep. Insomnia can cause fatigue, low energy, difficulty concentrating, mood swings, and poor performance at work or school. There are three different ways to classify insomnia:  Difficulty falling asleep.  Difficulty staying asleep.  Waking up too early in the morning. Any type of insomnia can be long-term (chronic) or short-term (acute). Both are common. Short-term insomnia usually lasts for three months or  less. Chronic insomnia occurs at least three times a week for longer than three months. What are the causes? Insomnia may be caused by another condition, situation, or substance, such as:  Anxiety.  Certain medicines.  Gastroesophageal reflux disease (GERD) or other gastrointestinal conditions.  Asthma or other breathing conditions.  Restless legs syndrome, sleep apnea, or other sleep disorders.  Chronic pain.  Menopause.  Stroke.  Abuse of alcohol, tobacco, or illegal drugs.  Mental health conditions, such as depression.  Caffeine.  Neurological disorders, such as Alzheimer's disease.  An overactive thyroid (hyperthyroidism). Sometimes, the cause of insomnia may not be known. What increases the risk? Risk factors for insomnia include:  Gender. Women are affected more often than men.  Age. Insomnia is more common as you get older.  Stress.  Lack of exercise.  Irregular work schedule or working night shifts.  Traveling between different time zones.  Certain medical and mental health conditions. What are the signs or symptoms? If you have insomnia, the main symptom is having trouble falling asleep or having trouble staying asleep. This may lead to other symptoms, such as:  Feeling fatigued or having low energy.  Feeling nervous about going to sleep.  Not feeling rested in the morning.  Having trouble concentrating.  Feeling irritable, anxious, or depressed. How is this diagnosed? This condition may be diagnosed based on:  Your symptoms and medical history. Your health  care provider may ask about: ? Your sleep habits. ? Any medical conditions you have. ? Your mental health.  A physical exam. How is this treated? Treatment for insomnia depends on the cause. Treatment may focus on treating an underlying condition that is causing insomnia. Treatment may also include:  Medicines to help you sleep.  Counseling or therapy.  Lifestyle adjustments to  help you sleep better. Follow these instructions at home: Eating and drinking   Limit or avoid alcohol, caffeinated beverages, and cigarettes, especially close to bedtime. These can disrupt your sleep.  Do not eat a large meal or eat spicy foods right before bedtime. This can lead to digestive discomfort that can make it hard for you to sleep. Sleep habits   Keep a sleep diary to help you and your health care provider figure out what could be causing your insomnia. Write down: ? When you sleep. ? When you wake up during the night. ? How well you sleep. ? How rested you feel the next day. ? Any side effects of medicines you are taking. ? What you eat and drink.  Make your bedroom a dark, comfortable place where it is easy to fall asleep. ? Put up shades or blackout curtains to block light from outside. ? Use a white noise machine to block noise. ? Keep the temperature cool.  Limit screen use before bedtime. This includes: ? Watching TV. ? Using your smartphone, tablet, or computer.  Stick to a routine that includes going to bed and waking up at the same times every day and night. This can help you fall asleep faster. Consider making a quiet activity, such as reading, part of your nighttime routine.  Try to avoid taking naps during the day so that you sleep better at night.  Get out of bed if you are still awake after 15 minutes of trying to sleep. Keep the lights down, but try reading or doing a quiet activity. When you feel sleepy, go back to bed. General instructions  Take over-the-counter and prescription medicines only as told by your health care provider.  Exercise regularly, as told by your health care provider. Avoid exercise starting several hours before bedtime.  Use relaxation techniques to manage stress. Ask your health care provider to suggest some techniques that may work well for you. These may include: ? Breathing exercises. ? Routines to release muscle  tension. ? Visualizing peaceful scenes.  Make sure that you drive carefully. Avoid driving if you feel very sleepy.  Keep all follow-up visits as told by your health care provider. This is important. Contact a health care provider if:  You are tired throughout the day.  You have trouble in your daily routine due to sleepiness.  You continue to have sleep problems, or your sleep problems get worse. Get help right away if:  You have serious thoughts about hurting yourself or someone else. If you ever feel like you may hurt yourself or others, or have thoughts about taking your own life, get help right away. You can go to your nearest emergency department or call:  Your local emergency services (911 in the [place]  A suicide crisis helpline, such as the [place] at 831-880-4287. This is open 24 hours a day. Summary  Insomnia is a sleep disorder that makes it difficult to fall asleep or stay asleep.  Insomnia can be long-term (chronic) or short-term (acute).  Treatment for insomnia depends on the cause. Treatment may focus on treating  an underlying condition that is causing insomnia.  Keep a sleep diary to help you and your health care provider figure out what could be causing your insomnia. This information is not intended to replace advice given to you by your health care provider. Make sure you discuss any questions you have with your health care provider. Document Revised: 03/18/2017 Document Reviewed: 01/13/2017 Elsevier Patient Education  2020 [employer].

## 2019-06-19 NOTE — Progress Notes (Signed)
[facility]    Established Patient Office Visit  Subjective:  Patient ID: Aaron Kennedy, male    DOB: 02-06-1971  Age: 49 y.o. MRN: [id number]  CC:  Chief Complaint  Patient presents with  . Follow-up    HTN  . Insomnia    sleep meds not working    HPI Aaron Kennedy is a 49 year old male who presents for Follow Up today.   Past Medical History:  Diagnosis Date  . Chronic pain of right knee   . Hypertension   . Insomnia 01/2019  . Stroke [facility])    Current Status: Aaron Kennedy is a former patient of Mike Gip, NP. He continues have insomnia and states that Trazodone is not effective. He denies visual changes, chest pain, cough, shortness of breath, heart palpitations, and falls. He has occasional headaches and dizziness with position changes. Denies severe headaches, confusion, seizures, double vision, and blurred vision, nausea and vomiting. He denies fevers, chills, fatigue, recent infections, weight loss, and night sweats. No reports of GI problems such as diarrhea, and constipation. He has no reports of blood in stools, dysuria and hematuria. No depression or anxiety reported today. He denies suicidal ideations, homicidal ideations, or auditory hallucinations. He had generalized pain today.   Past Surgical History:  Procedure Laterality Date  . HERNIA REPAIR    . INCISION AND DRAINAGE ABSCESS Left 07/21/2017   Procedure: INCISION AND DRAINAGE INGUINAL HERNIA ABSCESS;  Surgeon: Emelia Loron, MD;  Location: [facility] OR;  Service: General;  Laterality: Left;  . INGUINAL HERNIA REPAIR Bilateral 05/07/2017   Procedure: LAPAROSCOPIC RIGHT  INGUINAL HERNIA REPAIR AND  OPEN LEFT   INGUINAL EXPLORATION;  Surgeon: Axel Filler, MD;  Location: [facility] OR;  Service: General;  Laterality: Bilateral;  . INSERTION OF MESH Bilateral 05/07/2017   Procedure: INSERTION OF MESH;  Surgeon: Axel Filler, MD;  Location: [facility] OR;  Service:  General;  Laterality: Bilateral;  . LEG SURGERY      Family History  Problem Relation Age of Onset  . Stroke Brother        8s  . Heart attack Sister        72s    Social History   Socioeconomic History  . Marital status: Single    Spouse name: Not on file  . Number of children: Not on file  . Years of education: Not on file  . Highest education level: Not on file  Occupational History  . Not on file  Tobacco Use  . Smoking status: Current Every Day Smoker    Packs/day: 0.50    Years: 20.00    Pack years: 10.00    Types: Cigarettes  . Smokeless tobacco: Never Used  Substance and Sexual Activity  . Alcohol use: No  . Drug use: Yes    Types: Marijuana  . Sexual activity: Not on file  Other Topics Concern  . Not on file  Social History Narrative  . Not on file   Social Determinants of Health   Financial Resource Strain:   . Difficulty of Paying Living Expenses: Not on file  Food Insecurity:   . Worried About Programme researcher, broadcasting/film/video in the Last Year: Not on file  . Ran Out of Food in the Last Year: Not on file  Transportation Needs:   . Lack of Transportation (Medical): Not on file  . Lack of Transportation (Non-Medical): Not on file  Physical Activity:   .  Days of Exercise per Week: Not on file  . Minutes of Exercise per Session: Not on file  Stress:   . Feeling of Stress : Not on file  Social Connections:   . Frequency of Communication with Friends and Family: Not on file  . Frequency of Social Gatherings with Friends and Family: Not on file  . Attends Religious Services: Not on file  . Active Member of Clubs or Organizations: Not on file  . Attends Banker Meetings: Not on file  . Marital Status: Not on file  Intimate Partner Violence:   . Fear of Current or Ex-Partner: Not on file  . Emotionally Abused: Not on file  . Physically Abused: Not on file  . Sexually Abused: Not on file    Outpatient Medications Prior to Visit  Medication Sig  Dispense Refill  . atorvastatin (LIPITOR) 80 MG tablet TAKE 1 TABLET BY MOUTH DAILY AT 6 PM. 30 tablet 5  . clopidogrel (PLAVIX) 75 MG tablet Take 1 tablet (75 mg total) by mouth daily. 90 tablet 3  . gabapentin (NEURONTIN) 600 MG tablet TAKE 2 TABLETS BY MOUTH DAILY. 60 tablet 5  . lisinopril-hydrochlorothiazide (ZESTORETIC) 10-12.5 MG tablet Take 1 tablet by mouth daily. 30 tablet 4  . metFORMIN (GLUCOPHAGE) 500 MG tablet Take 1 tablet (500 mg total) by mouth 2 (two) times daily with a meal. 180 tablet 3  . metoprolol succinate (TOPROL-XL) 25 MG 24 hr tablet Take 1 tablet (25 mg total) by mouth daily. 90 tablet 5  . traZODone (DESYREL) 100 MG tablet Take 1 tablet (100 mg total) by mouth at bedtime as needed for sleep. 30 tablet 3   No facility-administered medications prior to visit.    No Known Allergies  ROS Review of Systems  Constitutional: Negative.   HENT: Negative.   Eyes: Negative.   Respiratory: Negative.   Cardiovascular: Negative.   Gastrointestinal: Negative.   Endocrine: Negative.   Genitourinary: Negative.   Musculoskeletal: Positive for arthralgias (generalized joint pain).  Skin: Negative.   Allergic/Immunologic: Negative.   Neurological: Positive for dizziness (occasional) and headaches (occasional ).  Hematological: Negative.   Psychiatric/Behavioral: Positive for sleep disturbance (insomnia).   Objective:    Physical Exam  Constitutional: He is oriented to person, place, and time. He appears well-developed and well-nourished.  HENT:  Head: Normocephalic and atraumatic.  Eyes: Conjunctivae are normal.  Cardiovascular: Normal rate, regular rhythm, normal heart sounds and intact distal pulses.  Pulmonary/Chest: Effort normal and breath sounds normal.  Abdominal: Soft. Bowel sounds are normal.  Musculoskeletal:        General: Normal range of motion.     Cervical back: Normal range of motion.  Neurological: He is alert and oriented to person, place, and  time. He has normal reflexes.  Skin: Skin is warm and dry.  Psychiatric: He has a normal mood and affect. His behavior is normal. Judgment and thought content normal.  Nursing note and vitals reviewed.   BP 113/74   Pulse 69   Temp 98.2 F (36.8 C) (Oral)   Ht 6' (1.829 m)   Wt 231 lb 9.6 oz (105.1 kg)   SpO2 96%   BMI 31.41 kg/m  Wt Readings from Last 3 Encounters:  06/19/19 231 lb 9.6 oz (105.1 kg)  02/06/19 225 lb (102.1 kg)  05/10/18 217 lb (98.4 kg)     There are no preventive care reminders to display for this patient.  There are no preventive care reminders to display  for this patient.  Lab Results  Component Value Date   TSH 1.660 02/06/2019   Lab Results  Component Value Date   WBC 8.5 02/06/2019   HGB 16.7 02/06/2019   HCT 47.8 02/06/2019   MCV 100 (H) 02/06/2019   PLT 191 02/06/2019   Lab Results  Component Value Date   NA 138 02/06/2019   K 4.4 02/06/2019   CO2 22 02/06/2019   GLUCOSE 101 (H) 02/06/2019   BUN 9 02/06/2019   CREATININE 0.94 02/06/2019   BILITOT 0.3 02/06/2019   ALKPHOS 119 (H) 02/06/2019   AST 20 02/06/2019   ALT 36 02/06/2019   PROT 6.6 02/06/2019   ALBUMIN 4.4 02/06/2019   CALCIUM 9.1 02/06/2019   ANIONGAP 4 (L) 07/22/2017   Lab Results  Component Value Date   CHOL 197 02/06/2019   Lab Results  Component Value Date   HDL 34 (L) 02/06/2019   Lab Results  Component Value Date   LDLCALC 147 (H) 02/06/2019   Lab Results  Component Value Date   TRIG 85 02/06/2019   Lab Results  Component Value Date   CHOLHDL 5.8 (H) 02/06/2019   Lab Results  Component Value Date   HGBA1C 5.7 (A) 02/06/2019    Assessment & Plan:   1. Essential hypertension The current medical regimen is effective; blood pressure is stable at 113/74 today; continue present plan and medications as prescribed. He will continue to take medications as prescribed, to decrease high sodium intake, excessive alcohol intake, increase potassium intake,  smoking cessation, and increase physical activity of at least 30 minutes of cardio activity daily. He will continue to follow Heart Healthy or DASH diet. - POCT urinalysis dipstick  2. Insomnia, unspecified type We will initiate Diphenhydramine today.  - diphenhydrAMINE (BENADRYL) 50 MG tablet; Take 1 tablet (50 mg total) by mouth at bedtime as needed for sleep.  Dispense: 30 tablet; Refill: 6  3. Anxiety  4. Follow up He will follow up with Dionisio David, NP in 6 months.   Meds ordered this encounter  Medications  . DISCONTD: diphenhydrAMINE (BENADRYL) 50 MG tablet    Sig: Take 1 tablet (50 mg total) by mouth at bedtime as needed for itching.    Dispense:  30 tablet    Refill:  6  . diphenhydrAMINE (BENADRYL) 50 MG tablet    Sig: Take 1 tablet (50 mg total) by mouth at bedtime as needed for sleep.    Dispense:  30 tablet    Refill:  6    Orders Placed This Encounter  Procedures  . POCT urinalysis dipstick    Referral Orders  No referral(s) requested today   Kathe Becton,  MSN, FNP-BC [facility] [address] [place], [place] [address] [phone] fax   Problem List Items Addressed This Visit      Cardiovascular and Mediastinum   HTN (hypertension) - Primary   Relevant Orders   POCT urinalysis dipstick (Completed)    Other Visit Diagnoses    Insomnia, unspecified type       Relevant Medications   diphenhydrAMINE (BENADRYL) 50 MG tablet   Anxiety       Follow up          Meds ordered this encounter  Medications  . diphenhydrAMINE (BENADRYL) 50 MG tablet    Sig: Take 1 tablet (50 mg total) by mouth at bedtime as needed for itching.    Dispense:  30 tablet    Refill:  6    Follow-up: No follow-ups on file.    Kallie Locks, FNP

## 2019-06-23 ENCOUNTER — Encounter: Payer: Self-pay | Admitting: Family Medicine

## 2019-06-23 DIAGNOSIS — G4700 Insomnia, unspecified: Secondary | ICD-10-CM | POA: Insufficient documentation

## 2019-06-23 DIAGNOSIS — F419 Anxiety disorder, unspecified: Secondary | ICD-10-CM | POA: Insufficient documentation

## 2019-06-23 MED ORDER — DIPHENHYDRAMINE HCL 50 MG PO TABS: 50.0000 mg | ORAL_TABLET | Freq: Every evening | ORAL | 6 refills | Status: DC | PRN

## 2019-07-25 MED FILL — ?CLOPIDOGREL 75MG TA: 75 | 30 days supply | Qty: 30 | Fill #8

## 2019-07-25 MED FILL — ?METOPROLOL SUCC ER 25MG TA: 25 | 30 days supply | Qty: 30 | Fill #8

## 2019-07-25 MED FILL — GABAPENTIN 600 MG TABLET: 600 | 30 days supply | Qty: 60 | Fill #1

## 2019-07-25 MED FILL — LISINOPRIL-HCTZ 10-12.5 MG: 10-12.5 | 30 days supply | Qty: 30 | Fill #9

## 2019-08-30 ENCOUNTER — Other Ambulatory Visit: Payer: Self-pay | Admitting: Family Medicine

## 2019-08-30 DIAGNOSIS — I10 Essential (primary) hypertension: Secondary | ICD-10-CM

## 2019-08-30 DIAGNOSIS — I6302 Cerebral infarction due to thrombosis of basilar artery: Secondary | ICD-10-CM

## 2019-10-03 MED FILL — CLOPIDOGREL 75 MG TABLET: 75 | 30 days supply | Qty: 30 | Fill #1

## 2019-10-03 MED FILL — GABAPENTIN 600 MG TABLET: 600 | 30 days supply | Qty: 60 | Fill #3

## 2019-10-03 MED FILL — LISINOPRIL-HCTZ 10-12.5 MG: 10-12.5 | 30 days supply | Qty: 30 | Fill #1

## 2019-10-03 MED FILL — METOPROLOL SUCCINATE ER 25: 25 | 30 days supply | Qty: 30 | Fill #1

## 2019-11-08 MED FILL — LISINOPRIL-HCTZ 10-12.5 MG: 10-12.5 | 30 days supply | Qty: 30 | Fill #2

## 2019-11-08 MED FILL — CLOPIDOGREL 75 MG TABLET: 75 | 30 days supply | Qty: 30 | Fill #2

## 2019-11-08 MED FILL — METOPROLOL SUCCINATE ER 25: 25 | 30 days supply | Qty: 30 | Fill #2

## 2019-11-08 MED FILL — GABAPENTIN 600 MG TABLET: 600 | 30 days supply | Qty: 60 | Fill #4

## 2019-12-10 ENCOUNTER — Other Ambulatory Visit: Payer: Self-pay | Admitting: Internal Medicine

## 2019-12-10 ENCOUNTER — Other Ambulatory Visit: Payer: Self-pay | Admitting: Family Medicine

## 2019-12-10 DIAGNOSIS — I10 Essential (primary) hypertension: Secondary | ICD-10-CM

## 2019-12-10 DIAGNOSIS — G629 Polyneuropathy, unspecified: Secondary | ICD-10-CM

## 2019-12-10 MED ORDER — GABAPENTIN 600 MG PO TABS: 1200.0000 mg | ORAL_TABLET | Freq: Every day | ORAL | 0 refills | Status: DC

## 2019-12-10 MED ORDER — METOPROLOL SUCCINATE ER 25 MG PO TB24: 25.0000 mg | ORAL_TABLET | Freq: Every day | ORAL | 5 refills | Status: DC

## 2019-12-10 MED ORDER — LISINOPRIL-HYDROCHLOROTHIAZIDE 10-12.5 MG PO TABS: 1.0000 | ORAL_TABLET | Freq: Every day | ORAL | 5 refills | Status: DC

## 2019-12-10 NOTE — Telephone Encounter (Signed)
Medication Refill - Medication:  gabapentin (NEURONTIN) 600 MG tablet [[id number] lisinopril-hydrochlorothiazide (ZESTORETIC) 10-12.5 MG tablet [[id number]]  metoprolol succinate (TOPROL-XL) 25 MG 24 hr tablet [[id number]]    Preferred Pharmacy (with phone number or street name):  [facility] - [place], [place] - [address]  [address] [place] [address]  Phone: [phone] Fax: [phone]  Hours: Not open 24 hours     Agent: Please be advised that RX refills may take up to 3 business days. We ask that you follow-up with your pharmacy.

## 2019-12-11 MED FILL — METOPROLOL SUCCINATE ER 25: 25 | 30 days supply | Qty: 30 | Fill #0

## 2019-12-11 MED FILL — GABAPENTIN 600 MG TABLET: 600 | 30 days supply | Qty: 60 | Fill #0

## 2019-12-11 MED FILL — LISINOPRIL-HCTZ 10-12.5 MG: 10-12.5 | 30 days supply | Qty: 30 | Fill #0

## 2019-12-20 ENCOUNTER — Ambulatory Visit (INDEPENDENT_AMBULATORY_CARE_PROVIDER_SITE_OTHER): Payer: Self-pay | Admitting: Nurse Practitioner

## 2019-12-20 ENCOUNTER — Other Ambulatory Visit: Payer: Self-pay

## 2019-12-20 ENCOUNTER — Encounter: Payer: Self-pay | Admitting: Nurse Practitioner

## 2019-12-20 VITALS — BP 136/86 | HR 65 | Temp 98.1°F | Ht 72.0 in | Wt 230.0 lb

## 2019-12-20 DIAGNOSIS — E785 Hyperlipidemia, unspecified: Secondary | ICD-10-CM

## 2019-12-20 DIAGNOSIS — G629 Polyneuropathy, unspecified: Secondary | ICD-10-CM

## 2019-12-20 DIAGNOSIS — I639 Cerebral infarction, unspecified: Secondary | ICD-10-CM

## 2019-12-20 DIAGNOSIS — G8929 Other chronic pain: Secondary | ICD-10-CM

## 2019-12-20 DIAGNOSIS — I10 Essential (primary) hypertension: Secondary | ICD-10-CM

## 2019-12-20 DIAGNOSIS — R7303 Prediabetes: Secondary | ICD-10-CM

## 2019-12-20 DIAGNOSIS — M25561 Pain in right knee: Secondary | ICD-10-CM

## 2019-12-20 DIAGNOSIS — Z9189 Other specified personal risk factors, not elsewhere classified: Secondary | ICD-10-CM

## 2019-12-20 DIAGNOSIS — Z1159 Encounter for screening for other viral diseases: Secondary | ICD-10-CM

## 2019-12-20 LAB — POCT GLYCOSYLATED HEMOGLOBIN (HGB A1C)
HbA1c POC (<> result, manual entry): 6.5 % (ref 4.0–5.6)
HbA1c, POC (controlled diabetic range): 6.5 % (ref 0.0–7.0)
HbA1c, POC (prediabetic range): 6.5 % — AB (ref 5.7–6.4)
Hemoglobin A1C: 6.5 % — AB (ref 4.0–5.6)

## 2019-12-20 MED ORDER — KETOROLAC TROMETHAMINE 60 MG/2ML IM SOLN
60.0000 mg | Freq: Once | INTRAMUSCULAR | Status: AC
  Administered 2019-12-20: 60 mg via INTRAMUSCULAR

## 2019-12-20 NOTE — Progress Notes (Signed)
[facility] Patient [facility] [address] [place], [place]  [address] Phone:  [phone]   Fax:  [phone]   Established Patient Office Visit  Subjective:  Patient ID: Aaron Kennedy, male    DOB: July 29, 1970  Age: 49 y.o. MRN: [id number]  CC:  Chief Complaint  Patient presents with  . Follow-up    f/u from stroke, wants to talk about another med for sleep    HPI Aaron Kennedy presents for follow up form 2016. He has right sided paralysis. He  has a past medical history of Anxiety, Chronic pain of right knee, Hypertension, Insomnia (01/2019), and Stroke (HCC).    Hypertension Patient is here for follow-up of elevated blood pressure. He is exercising and is adherent to a low-salt diet. Blood pressure is not monitiored at home. Cardiac symptoms: none. Patient denies chest pain, exertional chest pressure/discomfort, fatigue, irregular heart beat, lower extremity edema and palpitations. Cardiovascular risk factors: diabetes mellitus, dyslipidemia, hypertension, male gender, obesity (BMI >= 30 kg/m2) and smoking/ tobacco exposure. Use of agents associated with hypertension: none. History of target organ dam.age: stroke. He has some right sided weakness in foot. He notes this with his great toe. He has some occasional stumbles He has chronic right knee tendon pain.  Diabetes Mellitus Patient presents prediabetes.Current treatment: Continued metformin which has been effective, Continued statin which has been effective and Continued ACE inhibitor/ARB which has been effective  . Past Medical History:  Diagnosis Date  . Anxiety   . Chronic pain of right knee   . Hypertension   . Insomnia 01/2019  . Stroke [facility])     Past Surgical History:  Procedure Laterality Date  . HERNIA REPAIR    . INCISION AND DRAINAGE ABSCESS Left 07/21/2017   Procedure: INCISION AND DRAINAGE INGUINAL HERNIA ABSCESS;  Surgeon: Emelia Loron, MD;  Location: [facility] OR;  Service: General;  Laterality:  Left;  . INGUINAL HERNIA REPAIR Bilateral 05/07/2017   Procedure: LAPAROSCOPIC RIGHT  INGUINAL HERNIA REPAIR AND  OPEN LEFT   INGUINAL EXPLORATION;  Surgeon: Axel Filler, MD;  Location: [facility] OR;  Service: General;  Laterality: Bilateral;  . INSERTION OF MESH Bilateral 05/07/2017   Procedure: INSERTION OF MESH;  Surgeon: Axel Filler, MD;  Location: [facility] OR;  Service: General;  Laterality: Bilateral;  . LEG SURGERY      Family History  Problem Relation Age of Onset  . Stroke Brother        34s  . Heart attack Sister        60s    Social History   Socioeconomic History  . Marital status: Single    Spouse name: Not on file  . Number of children: Not on file  . Years of education: Not on file  . Highest education level: Not on file  Occupational History  . Not on file  Tobacco Use  . Smoking status: Current Every Day Smoker    Packs/day: 0.50    Years: 20.00    Pack years: 10.00    Types: Cigarettes  . Smokeless tobacco: Never Used  Vaping Use  . Vaping Use: Never used  Substance and Sexual Activity  . Alcohol use: No  . Drug use: Yes    Types: Marijuana  . Sexual activity: Not on file  Other Topics Concern  . Not on file  Social History Narrative  . Not on file   Social Determinants of Health   Financial Resource Strain:   . Difficulty of  Paying Living Expenses: Not on file  Food Insecurity:   . Worried About Programme researcher, broadcasting/film/video in the Last Year: Not on file  . Ran Out of Food in the Last Year: Not on file  Transportation Needs:   . Lack of Transportation (Medical): Not on file  . Lack of Transportation (Non-Medical): Not on file  Physical Activity:   . Days of Exercise per Week: Not on file  . Minutes of Exercise per Session: Not on file  Stress:   . Feeling of Stress : Not on file  Social Connections:   . Frequency of Communication with Friends and Family: Not on file  . Frequency of Social Gatherings with Friends and Family: Not on file  . Attends  Religious Services: Not on file  . Active Member of Clubs or Organizations: Not on file  . Attends Banker Meetings: Not on file  . Marital Status: Not on file  Intimate Partner Violence:   . Fear of Current or Ex-Partner: Not on file  . Emotionally Abused: Not on file  . Physically Abused: Not on file  . Sexually Abused: Not on file    Outpatient Medications Prior to Visit  Medication Sig Dispense Refill  . atorvastatin (LIPITOR) 80 MG tablet TAKE 1 TABLET BY MOUTH DAILY AT 6 PM. 30 tablet 5  . clopidogrel (PLAVIX) 75 MG tablet TAKE 1 TABLET (75 MG TOTAL) BY MOUTH DAILY. 30 tablet 3  . diphenhydrAMINE (BENADRYL) 50 MG tablet Take 1 tablet (50 mg total) by mouth at bedtime as needed for sleep. 30 tablet 6  . gabapentin (NEURONTIN) 600 MG tablet Take 2 tablets (1,200 mg total) by mouth daily. 60 tablet 0  . lisinopril-hydrochlorothiazide (ZESTORETIC) 10-12.5 MG tablet Take 1 tablet by mouth daily. 30 tablet 5  . metFORMIN (GLUCOPHAGE) 500 MG tablet Take 1 tablet (500 mg total) by mouth 2 (two) times daily with a meal. 180 tablet 3  . metoprolol succinate (TOPROL-XL) 25 MG 24 hr tablet Take 1 tablet (25 mg total) by mouth daily. 30 tablet 5  . traZODone (DESYREL) 100 MG tablet Take 1 tablet (100 mg total) by mouth at bedtime as needed for sleep. (Patient not taking: Reported on 06/23/2019) 30 tablet 3   No facility-administered medications prior to visit.    No Known Allergies  ROS Review of Systems  All other systems reviewed and are negative.     Objective:    Physical Exam Constitutional:      General: He is not in acute distress.    Appearance: He is not ill-appearing, toxic-appearing or diaphoretic.  HENT:     Head: Normocephalic.     Nose: Nose normal.     Mouth/Throat:     Mouth: Mucous membranes are moist.  Cardiovascular:     Rate and Rhythm: Normal rate and regular rhythm.     Pulses: Normal pulses.     Heart sounds: Normal heart sounds.  Pulmonary:       Effort: Pulmonary effort is normal.     Breath sounds: Normal breath sounds.  Abdominal:     Palpations: Abdomen is soft.  Musculoskeletal:        General: Tenderness present.     Cervical back: Normal range of motion.  Skin:    General: Skin is warm and dry.     Capillary Refill: Capillary refill takes less than 2 seconds.  Neurological:     General: No focal deficit present.     Mental Status:  He is alert and oriented to person, place, and time.  Psychiatric:        Mood and Affect: Mood normal.        Behavior: Behavior normal.        Thought Content: Thought content normal.        Judgment: Judgment normal.     BP 136/86   Pulse 65   Temp 98.1 F (36.7 C) (Temporal)   Ht 6' (1.829 m)   Wt 230 lb (104.3 kg)   SpO2 96%   BMI 31.19 kg/m  Wt Readings from Last 3 Encounters:  12/20/19 230 lb (104.3 kg)  06/19/19 231 lb 9.6 oz (105.1 kg)  02/06/19 225 lb (102.1 kg)     There are no preventive care reminders to display for this patient.  There are no preventive care reminders to display for this patient.  Lab Results  Component Value Date   TSH 1.660 02/06/2019   Lab Results  Component Value Date   WBC 8.5 02/06/2019   HGB 16.7 02/06/2019   HCT 47.8 02/06/2019   MCV 100 (H) 02/06/2019   PLT 191 02/06/2019   Lab Results  Component Value Date   NA 139 12/20/2019   K 4.2 12/20/2019   CO2 22 02/06/2019   GLUCOSE 106 (H) 12/20/2019   BUN 12 12/20/2019   CREATININE 1.00 12/20/2019   BILITOT 0.3 12/20/2019   ALKPHOS 115 12/20/2019   AST 13 12/20/2019   ALT 36 02/06/2019   PROT 6.3 12/20/2019   ALBUMIN 4.3 12/20/2019   CALCIUM 9.2 12/20/2019   ANIONGAP 4 (L) 07/22/2017   Lab Results  Component Value Date   CHOL 203 (H) 12/20/2019   Lab Results  Component Value Date   HDL 31 (L) 12/20/2019   Lab Results  Component Value Date   LDLCALC 135 (H) 12/20/2019   Lab Results  Component Value Date   TRIG 206 (H) 12/20/2019   Lab Results   Component Value Date   CHOLHDL 6.5 (H) 12/20/2019   Lab Results  Component Value Date   HGBA1C 6.5 (A) 12/20/2019   HGBA1C 6.5 12/20/2019   HGBA1C 6.5 (A) 12/20/2019   HGBA1C 6.5 12/20/2019      Assessment & Plan:   Problem List Items Addressed This Visit      Cardiovascular and Mediastinum   CVA (cerebral vascular accident) (HCC) Continue with current regimen Discussed co morbidities and risk factor along with tobacco use Discussed bleeding concerns with current tx    HTN (hypertension) - Primary Encouraged on going compliance with current medication regimen Encouraged home monitoring and recording BP <130/80 Eating a heart-healthy diet with less salt Encouraged regular physical activity  Recommend Weight loss      Relevant Orders   Comp. Metabolic Panel (12) (Completed)     Other   Chronic pain of right knee Ketorolac provided encourage pursuing the orange card for further treatment options   HLD (hyperlipidemia)   Relevant Orders   Lipid panel (Completed)   Prediabetes Encourage compliance with current treatment regimen   Lifestyle modification with healthy diet (fewer calories, more high fiber foods, whole grains and non-starchy vegetables, lower fat meat and fish, low-fat diary include healthy oils) regular exercise (physical activity) and weight loss Home BP monitoring also encouraged goal <130/80     Relevant Orders   POCT HgB A1C (Completed)    Other Visit Diagnoses    Neuropathy    Discussed glycemic control  Discussed treatment options  Encounter for hepatitis C virus screening test for high risk patient       Relevant Orders   Hepatitis C antibody (Completed)      Meds ordered this encounter  Medications  . ketorolac (TORADOL) injection 60 mg    Follow-up: Return in about 3 months (around 03/20/2020).    Barbette Merino, NP

## 2019-12-20 NOTE — Patient Instructions (Signed)
   Managing Your Hypertension Hypertension is commonly called high blood pressure. This is when the force of your blood pressing against the walls of your arteries is too strong. Arteries are blood vessels that carry blood from your heart throughout your body. Hypertension forces the heart to work harder to pump blood, and may cause the arteries to become narrow or stiff. Having untreated or uncontrolled hypertension can cause heart attack, stroke, kidney disease, and other problems. What are blood pressure readings? A blood pressure reading consists of a higher number over a lower number. Ideally, your blood pressure should be below 120/80. The first ("top") number is called the systolic pressure. It is a measure of the pressure in your arteries as your heart beats. The second ("bottom") number is called the diastolic pressure. It is a measure of the pressure in your arteries as the heart relaxes. What does my blood pressure reading mean? Blood pressure is classified into four stages. Based on your blood pressure reading, your health care provider may use the following stages to determine what type of treatment you need, if any. Systolic pressure and diastolic pressure are measured in a unit called mm Hg. Normal  Systolic pressure: below 120.  Diastolic pressure: below 80. Elevated  Systolic pressure: 120-129.  Diastolic pressure: below 80. Hypertension stage 1  Systolic pressure: 130-139.  Diastolic pressure: 80-89. Hypertension stage 2  Systolic pressure: 140 or above.  Diastolic pressure: 90 or above. What health risks are associated with hypertension? Managing your hypertension is an important responsibility. Uncontrolled hypertension can lead to:  A heart attack.  A stroke.  A weakened blood vessel (aneurysm).  Heart failure.  Kidney damage.  Eye damage.  Metabolic syndrome.  Memory and concentration problems. What changes can I make to manage my  hypertension? Hypertension can be managed by making lifestyle changes and possibly by taking medicines. Your health care provider will help you make a plan to bring your blood pressure within a normal range. Eating and drinking   Eat a diet that is high in fiber and potassium, and low in salt (sodium), added sugar, and fat. An example eating plan is called the DASH (Dietary Approaches to Stop Hypertension) diet. To eat this way: ? Eat plenty of fresh fruits and vegetables. Try to fill half of your plate at each meal with fruits and vegetables. ? Eat whole grains, such as whole wheat pasta, brown rice, or whole grain bread. Fill about one quarter of your plate with whole grains. ? Eat low-fat diary products. ? Avoid fatty cuts of meat, processed or cured meats, and poultry with skin. Fill about one quarter of your plate with lean proteins such as fish, chicken without skin, beans, eggs, and tofu. ? Avoid premade and processed foods. These tend to be higher in sodium, added sugar, and fat.  Reduce your daily sodium intake. Most people with hypertension should eat less than 1,500 mg of sodium a day.  Limit alcohol intake to no more than 1 drink a day for nonpregnant women and 2 drinks a day for men. One drink equals 12 oz of beer, 5 oz of wine, or 1 oz of hard liquor. Lifestyle  Work with your health care provider to maintain a healthy body weight, or to lose weight. Ask what an ideal weight is for you.  Get at least 30 minutes of exercise that causes your heart to beat faster (aerobic exercise) most days of the week. Activities may include walking, swimming, or biking.    Include exercise to strengthen your muscles (resistance exercise), such as weight lifting, as part of your weekly exercise routine. Try to do these types of exercises for 30 minutes at least 3 days a week.  Do not use any products that contain nicotine or tobacco, such as cigarettes and e-cigarettes. If you need help quitting,  ask your health care provider.  Control any long-term (chronic) conditions you have, such as high cholesterol or diabetes. Monitoring  Monitor your blood pressure at home as told by your health care provider. Your personal target blood pressure may vary depending on your medical conditions, your age, and other factors.  Have your blood pressure checked regularly, as often as told by your health care provider. Working with your health care provider  Review all the medicines you take with your health care provider because there may be side effects or interactions.  Talk with your health care provider about your diet, exercise habits, and other lifestyle factors that may be contributing to hypertension.  Visit your health care provider regularly. Your health care provider can help you create and adjust your plan for managing hypertension. Will I need medicine to control my blood pressure? Your health care provider may prescribe medicine if lifestyle changes are not enough to get your blood pressure under control, and if:  Your systolic blood pressure is 130 or higher.  Your diastolic blood pressure is 80 or higher. Take medicines only as told by your health care provider. Follow the directions carefully. Blood pressure medicines must be taken as prescribed. The medicine does not work as well when you skip doses. Skipping doses also puts you at risk for problems. Contact a health care provider if:  You think you are having a reaction to medicines you have taken.  You have repeated (recurrent) headaches.  You feel dizzy.  You have swelling in your ankles.  You have trouble with your vision. Get help right away if:  You develop a severe headache or confusion.  You have unusual weakness or numbness, or you feel faint.  You have severe pain in your chest or abdomen.  You vomit repeatedly.  You have trouble breathing. Summary  Hypertension is when the force of blood pumping  through your arteries is too strong. If this condition is not controlled, it may put you at risk for serious complications.  Your personal target blood pressure may vary depending on your medical conditions, your age, and other factors. For most people, a normal blood pressure is less than 120/80.  Hypertension is managed by lifestyle changes, medicines, or both. Lifestyle changes include weight loss, eating a healthy, low-sodium diet, exercising more, and limiting alcohol. This information is not intended to replace advice given to you by your health care provider. Make sure you discuss any questions you have with your health care provider. Document Revised: 07/28/2018 Document Reviewed: 03/03/2016 Elsevier Patient Education  2020 [employer].  

## 2019-12-21 LAB — COMP. METABOLIC PANEL (12)
AST: 13 IU/L (ref 0–40)
Albumin/Globulin Ratio: 2.2 (ref 1.2–2.2)
Albumin: 4.3 g/dL (ref 4.0–5.0)
Alkaline Phosphatase: 115 IU/L (ref 48–121)
BUN/Creatinine Ratio: 12 (ref 9–20)
BUN: 12 mg/dL (ref 6–24)
Bilirubin Total: 0.3 mg/dL (ref 0.0–1.2)
Calcium: 9.2 mg/dL (ref 8.7–10.2)
Chloride: 103 mmol/L (ref 96–106)
Creatinine, Ser: 1 mg/dL (ref 0.76–1.27)
GFR calc Af Amer: 102 mL/min/{1.73_m2} (ref >59)
GFR calc non Af Amer: 88 mL/min/{1.73_m2} (ref >59)
Globulin, Total: 2 g/dL (ref 1.5–4.5)
Glucose: 106 mg/dL — ABNORMAL HIGH (ref 65–99)
Potassium: 4.2 mmol/L (ref 3.5–5.2)
Sodium: 139 mmol/L (ref 134–144)
Total Protein: 6.3 g/dL (ref 6.0–8.5)

## 2019-12-21 LAB — LIPID PANEL
Chol/HDL Ratio: 6.5 ratio — ABNORMAL HIGH (ref 0.0–5.0)
Cholesterol, Total: 203 mg/dL — ABNORMAL HIGH (ref 100–199)
HDL: 31 mg/dL — ABNORMAL LOW (ref >39)
LDL Chol Calc (NIH): 135 mg/dL — ABNORMAL HIGH (ref 0–99)
Triglycerides: 206 mg/dL — ABNORMAL HIGH (ref 0–149)
VLDL Cholesterol Cal: 37 mg/dL (ref 5–40)

## 2019-12-21 LAB — HEPATITIS C ANTIBODY: Hep C Virus Ab: <0.1 s/co ratio (ref 0.0–0.9)

## 2019-12-23 ENCOUNTER — Encounter: Payer: Self-pay | Admitting: Nurse Practitioner

## 2020-01-16 MED FILL — CLOPIDOGREL 75 MG TABLET: 75 | 30 days supply | Qty: 30 | Fill #3

## 2020-01-16 MED FILL — METOPROLOL SUCCINATE ER 25: 25 | 30 days supply | Qty: 30 | Fill #1

## 2020-01-16 MED FILL — LISINOPRIL-HCTZ 10-12.5 MG: 10-12.5 | 30 days supply | Qty: 30 | Fill #1

## 2020-02-21 ENCOUNTER — Other Ambulatory Visit: Payer: Self-pay | Admitting: Family Medicine

## 2020-02-21 ENCOUNTER — Telehealth: Payer: Self-pay | Admitting: Nurse Practitioner

## 2020-02-21 ENCOUNTER — Other Ambulatory Visit: Payer: Self-pay | Admitting: Nurse Practitioner

## 2020-02-21 DIAGNOSIS — E785 Hyperlipidemia, unspecified: Secondary | ICD-10-CM

## 2020-02-21 DIAGNOSIS — I6302 Cerebral infarction due to thrombosis of basilar artery: Secondary | ICD-10-CM

## 2020-02-21 MED ORDER — ATORVASTATIN CALCIUM 80 MG PO TABS: ORAL_TABLET | ORAL | 5 refills | Status: DC

## 2020-02-21 MED FILL — METOPROLOL SUCCINATE ER 25: 25 | 30 days supply | Qty: 30 | Fill #2

## 2020-02-21 MED FILL — LISINOPRIL-HCTZ 10-12.5 MG: 10-12.5 | 30 days supply | Qty: 30 | Fill #2

## 2020-02-21 MED FILL — GABAPENTIN 600 MG TABLET: 600 | 30 days supply | Qty: 60 | Fill #5

## 2020-02-21 MED FILL — ATORVASTATIN CALCIUM 80 MG: 80 | 30 days supply | Qty: 30 | Fill #0

## 2020-02-21 MED FILL — CLOPIDOGREL 75 MG TABLET: 75 | 30 days supply | Qty: 30 | Fill #0

## 2020-02-21 NOTE — Telephone Encounter (Signed)
Sent!

## 2020-03-20 ENCOUNTER — Ambulatory Visit: Payer: Self-pay | Admitting: Nurse Practitioner

## 2020-03-21 ENCOUNTER — Telehealth: Payer: Self-pay | Admitting: Clinical

## 2020-03-21 NOTE — Telephone Encounter (Signed)
[facility] General Follow Up Note  03/21/2020 Name: Aaron Kennedy MRN: [id number] DOB: December 03, 1970 Aaron Kennedy is a 49 y.o. year old male who sees Aaron Merino, NP for primary care. LCSW was initially consulted to help with [facility] application.  Interpreter: No.   Interpreter Name & Language: none  Assessment: Patient requested assistance with completing Orange Card application. CSW received referral from front desk staff.  Ongoing Intervention: CSW called patient to discuss [employer]. Patient has appointment with PCP [facility] at [facility]) on 03/24/20. CSW and patient will meet then to review the application and CSW will assist as needed.  Review of patient status, including review of consultants reports, relevant laboratory and other test results, and collaboration with appropriate care team members and the patient's provider was performed as part of comprehensive patient evaluation and provision of services.     Follow up Plan: 1. At PCP visit 03/24/20  Aaron Butts, LCSW [facility] [phone]

## 2020-03-24 ENCOUNTER — Other Ambulatory Visit: Payer: Self-pay

## 2020-03-24 ENCOUNTER — Encounter: Payer: Self-pay | Admitting: Nurse Practitioner

## 2020-03-24 ENCOUNTER — Other Ambulatory Visit: Payer: Self-pay | Admitting: Nurse Practitioner

## 2020-03-24 ENCOUNTER — Ambulatory Visit (INDEPENDENT_AMBULATORY_CARE_PROVIDER_SITE_OTHER): Payer: Self-pay | Admitting: Nurse Practitioner

## 2020-03-24 VITALS — BP 123/75 | HR 72 | Temp 98.5°F | Resp 18 | Ht 72.0 in | Wt 231.0 lb

## 2020-03-24 DIAGNOSIS — E118 Type 2 diabetes mellitus with unspecified complications: Secondary | ICD-10-CM

## 2020-03-24 DIAGNOSIS — I10 Essential (primary) hypertension: Secondary | ICD-10-CM

## 2020-03-24 DIAGNOSIS — I6302 Cerebral infarction due to thrombosis of basilar artery: Secondary | ICD-10-CM

## 2020-03-24 DIAGNOSIS — I639 Cerebral infarction, unspecified: Secondary | ICD-10-CM

## 2020-03-24 DIAGNOSIS — G629 Polyneuropathy, unspecified: Secondary | ICD-10-CM

## 2020-03-24 DIAGNOSIS — M5441 Lumbago with sciatica, right side: Secondary | ICD-10-CM

## 2020-03-24 DIAGNOSIS — E785 Hyperlipidemia, unspecified: Secondary | ICD-10-CM

## 2020-03-24 DIAGNOSIS — G8929 Other chronic pain: Secondary | ICD-10-CM

## 2020-03-24 DIAGNOSIS — M5442 Lumbago with sciatica, left side: Secondary | ICD-10-CM

## 2020-03-24 MED ORDER — GABAPENTIN 600 MG PO TABS: 1200.0000 mg | ORAL_TABLET | Freq: Every day | ORAL | 11 refills | Status: DC

## 2020-03-24 MED ORDER — CLOPIDOGREL BISULFATE 75 MG PO TABS: 75.0000 mg | ORAL_TABLET | Freq: Every day | ORAL | 11 refills | Status: DC

## 2020-03-24 MED ORDER — ATORVASTATIN CALCIUM 80 MG PO TABS: ORAL_TABLET | ORAL | 11 refills | Status: DC

## 2020-03-24 MED ORDER — METOPROLOL SUCCINATE ER 25 MG PO TB24: 25.0000 mg | ORAL_TABLET | Freq: Every day | ORAL | 11 refills | Status: DC

## 2020-03-24 MED ORDER — LISINOPRIL-HYDROCHLOROTHIAZIDE 10-12.5 MG PO TABS: 1.0000 | ORAL_TABLET | Freq: Every day | ORAL | 11 refills | Status: DC

## 2020-03-24 MED ORDER — AMITRIPTYLINE HCL 150 MG PO TABS: 150.0000 mg | ORAL_TABLET | Freq: Every day | ORAL | 11 refills | Status: DC

## 2020-03-24 MED ORDER — KETOROLAC TROMETHAMINE 60 MG/2ML IM SOLN
60.0000 mg | Freq: Once | INTRAMUSCULAR | Status: AC
  Administered 2020-03-24: 60 mg via INTRAMUSCULAR

## 2020-03-24 MED ORDER — METFORMIN HCL 500 MG PO TABS: 500.0000 mg | ORAL_TABLET | Freq: Two times a day (BID) | ORAL | 3 refills | Status: DC

## 2020-03-24 MED FILL — GABAPENTIN 600 MG TABLET: 600 | 30 days supply | Qty: 60 | Fill #0

## 2020-03-24 MED FILL — METOPROLOL SUCCINATE ER 25: 25 | 30 days supply | Qty: 30 | Fill #0

## 2020-03-24 MED FILL — ATORVASTATIN CALCIUM 80 MG: 80 | 30 days supply | Qty: 30 | Fill #0

## 2020-03-24 MED FILL — LISINOPRIL-HCTZ 10-12.5 MG: 10-12.5 | 30 days supply | Qty: 30 | Fill #0

## 2020-03-24 MED FILL — METFORMIN HCL 500 MG TABS: 500 | 30 days supply | Qty: 60 | Fill #0

## 2020-03-24 MED FILL — CLOPIDOGREL 75 MG TABLET: 75 | 30 days supply | Qty: 30 | Fill #0

## 2020-03-24 MED FILL — AMITRIPTYLINE HCL 50 MG TAB: 50 | 30 days supply | Qty: 90 | Fill #0

## 2020-03-24 NOTE — Progress Notes (Signed)
[facility] Patient [facility] [address] [place], [place]  [address] Phone:  [phone]   Fax:  [phone]   Established Patient Office Visit  Subjective:  Patient ID: Aaron Kennedy, male    DOB: Aug 16, 1970  Age: 49 y.o. MRN: [id number]  CC: No chief complaint on file.   HPI Aaron Kennedy presents for establish care. He  has a past medical history of Anxiety, Chronic pain of right knee, Hypertension, Insomnia (01/2019), and Stroke (HCC).   Hypertension Patient is here for follow-up of elevated blood pressure. He is not exercising and is adherent to a low-salt diet. Blood pressure is well controlled at home. Cardiac symptoms: none. Patient denies chest pain, exertional chest pressure/discomfort, fatigue, irregular heart beat, lower extremity edema, palpitations and syncope. Cardiovascular risk factors: diabetes mellitus, dyslipidemia, hypertension, male gender, microalbuminuria, obesity (BMI >= 30 kg/m2), sedentary lifestyle and smoking/ tobacco exposure. Use of agents associated with hypertension: none. History of target organ damage: stroke.  Diabetes Mellitus Patient presents for follow up of diabetes. He has a history of prediabetes however last a1c was 6.5% with use of metformin. Current symptoms include: paresthesia of the feet. Symptoms have stabilized. Patient denies foot ulcerations, hyperglycemia, hypoglycemia , increased appetite, nausea, polydipsia, polyuria and vomiting. Evaluation to date has included: fasting blood sugar, fasting lipid panel and hemoglobin A1C.  Home sugars: patient does not check sugars. Current treatment: Continued metformin which has been effective.   He admits that he is under some increased stress with limitations due to his previous stroke and lack of resources a teenage daughter.  Past Medical History:  Diagnosis Date  . Anxiety   . Chronic pain of right knee   . Hypertension   . Insomnia 01/2019  . Stroke [facility])     Past Surgical  History:  Procedure Laterality Date  . HERNIA REPAIR    . INCISION AND DRAINAGE ABSCESS Left 07/21/2017   Procedure: INCISION AND DRAINAGE INGUINAL HERNIA ABSCESS;  Surgeon: Emelia Loron, MD;  Location: [facility] OR;  Service: General;  Laterality: Left;  . INGUINAL HERNIA REPAIR Bilateral 05/07/2017   Procedure: LAPAROSCOPIC RIGHT  INGUINAL HERNIA REPAIR AND  OPEN LEFT   INGUINAL EXPLORATION;  Surgeon: Axel Filler, MD;  Location: [facility] OR;  Service: General;  Laterality: Bilateral;  . INSERTION OF MESH Bilateral 05/07/2017   Procedure: INSERTION OF MESH;  Surgeon: Axel Filler, MD;  Location: [facility] OR;  Service: General;  Laterality: Bilateral;  . LEG SURGERY      Family History  Problem Relation Age of Onset  . Stroke Brother        65s  . Heart attack Sister        82s    Social History   Socioeconomic History  . Marital status: Single    Spouse name: Not on file  . Number of children: Not on file  . Years of education: Not on file  . Highest education level: Not on file  Occupational History  . Not on file  Tobacco Use  . Smoking status: Current Every Day Smoker    Packs/day: 0.50    Years: 20.00    Pack years: 10.00    Types: Cigarettes  . Smokeless tobacco: Never Used  Vaping Use  . Vaping Use: Never used  Substance and Sexual Activity  . Alcohol use: No  . Drug use: Yes    Types: Marijuana  . Sexual activity: Not on file  Other Topics Concern  . Not on  file  Social History Narrative  . Not on file   Social Determinants of Health   Financial Resource Strain: Not on file  Food Insecurity: Not on file  Transportation Needs: Not on file  Physical Activity: Not on file  Stress: Not on file  Social Connections: Not on file  Intimate Partner Violence: Not on file    Outpatient Medications Prior to Visit  Medication Sig Dispense Refill  . diphenhydrAMINE (BENADRYL) 50 MG tablet Take 1 tablet (50 mg total) by mouth at bedtime as needed for sleep. 30 tablet 6   . atorvastatin (LIPITOR) 80 MG tablet TAKE 1 TABLET BY MOUTH DAILY AT 6 PM. 30 tablet 5  . clopidogrel (PLAVIX) 75 MG tablet TAKE 1 TABLET (75 MG TOTAL) BY MOUTH DAILY. 30 tablet 3  . gabapentin (NEURONTIN) 600 MG tablet Take 2 tablets (1,200 mg total) by mouth daily. 60 tablet 0  . lisinopril-hydrochlorothiazide (ZESTORETIC) 10-12.5 MG tablet Take 1 tablet by mouth daily. 30 tablet 5  . metFORMIN (GLUCOPHAGE) 500 MG tablet Take 1 tablet (500 mg total) by mouth 2 (two) times daily with a meal. (Patient not taking: Reported on 03/24/2020) 180 tablet 3  . metoprolol succinate (TOPROL-XL) 25 MG 24 hr tablet Take 1 tablet (25 mg total) by mouth daily. 30 tablet 5  . traZODone (DESYREL) 100 MG tablet Take 1 tablet (100 mg total) by mouth at bedtime as needed for sleep. (Patient not taking: Reported on 06/23/2019) 30 tablet 3   No facility-administered medications prior to visit.    No Known Allergies  ROS Review of Systems  Constitutional: Negative.   HENT: Negative.   Eyes: Negative.   Respiratory: Negative.   Cardiovascular: Negative.   Gastrointestinal: Negative.   Endocrine: Negative.   Genitourinary: Negative.   Musculoskeletal: Positive for back pain and myalgias.  Skin: Negative.       Objective:    Physical Exam Constitutional:      Appearance: He is not ill-appearing, toxic-appearing or diaphoretic.  HENT:     Head: Normocephalic and atraumatic.     Nose: Nose normal.     Mouth/Throat:     Mouth: Mucous membranes are moist.  Cardiovascular:     Rate and Rhythm: Normal rate and regular rhythm.     Pulses: Normal pulses.     Heart sounds: Normal heart sounds.  Pulmonary:     Effort: Pulmonary effort is normal.     Breath sounds: Normal breath sounds.  Abdominal:     General: Bowel sounds are normal.     Palpations: Abdomen is soft.  Musculoskeletal:        General: Tenderness present.     Cervical back: Normal range of motion.     Comments: Right sided weakness   Skin:    General: Skin is warm and dry.     Capillary Refill: Capillary refill takes less than 2 seconds.  Neurological:     General: No focal deficit present.     Mental Status: He is alert and oriented to person, place, and time.     Motor: Weakness present.     Gait: Gait abnormal.  Psychiatric:        Mood and Affect: Mood normal.        Behavior: Behavior normal.        Thought Content: Thought content normal.        Judgment: Judgment normal.     BP 123/75 (BP Location: Left Arm, Patient Position: Sitting, Cuff Size: Large)  Pulse 72   Temp 98.5 F (36.9 C)   Resp 18   Ht 6' (1.829 m)   Wt 231 lb (104.8 kg)   SpO2 97%   BMI 31.33 kg/m  Wt Readings from Last 3 Encounters:  03/24/20 231 lb (104.8 kg)  12/20/19 230 lb (104.3 kg)  06/19/19 231 lb 9.6 oz (105.1 kg)     There are no preventive care reminders to display for this patient.  There are no preventive care reminders to display for this patient.  Lab Results  Component Value Date   TSH 1.660 02/06/2019   Lab Results  Component Value Date   WBC 8.5 02/06/2019   HGB 16.7 02/06/2019   HCT 47.8 02/06/2019   MCV 100 (H) 02/06/2019   PLT 191 02/06/2019   Lab Results  Component Value Date   NA 139 03/24/2020   K 4.0 03/24/2020   CO2 22 02/06/2019   GLUCOSE 135 (H) 03/24/2020   BUN 11 03/24/2020   CREATININE 0.98 03/24/2020   BILITOT 0.4 03/24/2020   ALKPHOS 127 (H) 03/24/2020   AST 13 03/24/2020   ALT 36 02/06/2019   PROT 6.9 03/24/2020   ALBUMIN 4.3 03/24/2020   CALCIUM 8.9 03/24/2020   ANIONGAP 4 (L) 07/22/2017   Lab Results  Component Value Date   CHOL 203 (H) 12/20/2019   Lab Results  Component Value Date   HDL 31 (L) 12/20/2019   Lab Results  Component Value Date   LDLCALC 135 (H) 12/20/2019   Lab Results  Component Value Date   TRIG 206 (H) 12/20/2019   Lab Results  Component Value Date   CHOLHDL 6.5 (H) 12/20/2019   Lab Results  Component Value Date   HGBA1C 6.5 (A)  12/20/2019   HGBA1C 6.5 12/20/2019   HGBA1C 6.5 (A) 12/20/2019   HGBA1C 6.5 12/20/2019      Assessment & Plan:   Problem List Items Addressed This Visit      Cardiovascular and Mediastinum   CVA (cerebral vascular accident) (HCC) Will continue to treat HTM, DM and HLD medically with lifestyle modifications to decreased future stroke risk    Relevant Medications   lisinopril-hydrochlorothiazide (ZESTORETIC) 10-12.5 MG tablet   metoprolol succinate (TOPROL-XL) 25 MG 24 hr tablet   atorvastatin (LIPITOR) 80 MG tablet   clopidogrel (PLAVIX) 75 MG tablet     Other   HLD (hyperlipidemia) - Primary   Relevant Medications   lisinopril-hydrochlorothiazide (ZESTORETIC) 10-12.5 MG tablet   metoprolol succinate (TOPROL-XL) 25 MG 24 hr tablet   atorvastatin (LIPITOR) 80 MG tablet    Other Visit Diagnoses    Essential hypertension     Encouraged on going compliance with current medication regimen Encouraged home monitoring and recording BP <130/80 Eating a heart-healthy diet with less salt Encouraged regular physical activity  Recommend Weight loss   Relevant Medications   lisinopril-hydrochlorothiazide (ZESTORETIC) 10-12.5 MG tablet   metoprolol succinate (TOPROL-XL) 25 MG 24 hr tablet   atorvastatin (LIPITOR) 80 MG tablet   Other Relevant Orders   Comp. Metabolic Panel (12) (Completed)   Neuropathy     Stable will continue with gabapentin    Relevant Medications   gabapentin (NEURONTIN) 600 MG tablet   Chronic bilateral low back pain with bilateral sciatica     Flare up today; Toradol 60 mg IM x 1 for pain relief   Relevant Medications   amitriptyline (ELAVIL) 150 MG tablet   gabapentin (NEURONTIN) 600 MG tablet   ketorolac (TORADOL) injection 60 mg (  Completed)   DM type 2, controlled, with complication (HCC)   Encourage compliance with current treatment regimen   Encourage regular CBG monitoring Encourage contacting office if excessive hyperglycemia and or  hypoglycemia Lifestyle modification with healthy diet (fewer calories, more high fiber foods, whole grains and non-starchy vegetables, lower fat meat and fish, low-fat diary include healthy oils) regular exercise (physical activity) and weight loss Home BP monitoring also encouraged goal <130/80       Relevant Medications   lisinopril-hydrochlorothiazide (ZESTORETIC) 10-12.5 MG tablet   metFORMIN (GLUCOPHAGE) 500 MG tablet   atorvastatin (LIPITOR) 80 MG tablet      Meds ordered this encounter  Medications  . amitriptyline (ELAVIL) 150 MG tablet    Sig: Take 1 tablet (150 mg total) by mouth at bedtime.    Dispense:  30 tablet    Refill:  11    Order Specific Question:   Supervising Provider    Answer:   Quentin Angst [id number]  . gabapentin (NEURONTIN) 600 MG tablet    Sig: Take 2 tablets (1,200 mg total) by mouth daily.    Dispense:  60 tablet    Refill:  11    Order Specific Question:   Supervising Provider    Answer:   Quentin Angst [id number]  . lisinopril-hydrochlorothiazide (ZESTORETIC) 10-12.5 MG tablet    Sig: Take 1 tablet by mouth daily.    Dispense:  30 tablet    Refill:  11    Order Specific Question:   Supervising Provider    Answer:   Quentin Angst [id number]  . metoprolol succinate (TOPROL-XL) 25 MG 24 hr tablet    Sig: Take 1 tablet (25 mg total) by mouth daily.    Dispense:  30 tablet    Refill:  11    Order Specific Question:   Supervising Provider    Answer:   Quentin Angst [id number]  . metFORMIN (GLUCOPHAGE) 500 MG tablet    Sig: Take 1 tablet (500 mg total) by mouth 2 (two) times daily with a meal.    Dispense:  180 tablet    Refill:  3    Order Specific Question:   Supervising Provider    Answer:   Quentin Angst [id number]  . atorvastatin (LIPITOR) 80 MG tablet    Sig: TAKE 1 TABLET BY MOUTH DAILY AT 6 PM.    Dispense:  30 tablet    Refill:  11    Dose change    Order Specific Question:   Supervising Provider     Answer:   Quentin Angst [id number]  . clopidogrel (PLAVIX) 75 MG tablet    Sig: Take 1 tablet (75 mg total) by mouth daily.    Dispense:  30 tablet    Refill:  11    Order Specific Question:   Supervising Provider    Answer:   Quentin Angst [id number]  . ketorolac (TORADOL) injection 60 mg    Follow-up: Return in about 6 months (around 09/22/2020).    Barbette Merino, NP

## 2020-03-24 NOTE — Patient Instructions (Signed)
Hypertension, Adult Hypertension is another name for high blood pressure. High blood pressure forces your heart to work harder to pump blood. This can cause problems over time. There are two numbers in a blood pressure reading. There is a top number (systolic) over a bottom number (diastolic). It is best to have a blood pressure that is below 120/80. Healthy choices can help lower your blood pressure, or you may need medicine to help lower it. What are the causes? The cause of this condition is not known. Some conditions may be related to high blood pressure. What increases the risk?  Smoking.  Having type 2 diabetes mellitus, high cholesterol, or both.  Not getting enough exercise or physical activity.  Being overweight.  Having too much fat, sugar, calories, or salt (sodium) in your diet.  Drinking too much alcohol.  Having long-term (chronic) kidney disease.  Having a family history of high blood pressure.  Age. Risk increases with age.  Race. You may be at higher risk if you are African American.  Gender. Men are at higher risk than women before age 45. After age 65, women are at higher risk than men.  Having obstructive sleep apnea.  Stress. What are the signs or symptoms?  High blood pressure may not cause symptoms. Very high blood pressure (hypertensive crisis) may cause: ? Headache. ? Feelings of worry or nervousness (anxiety). ? Shortness of breath. ? Nosebleed. ? A feeling of being sick to your stomach (nausea). ? Throwing up (vomiting). ? Changes in how you see. ? Very bad chest pain. ? Seizures. How is this treated?  This condition is treated by making healthy lifestyle changes, such as: ? Eating healthy foods. ? Exercising more. ? Drinking less alcohol.  Your health care provider may prescribe medicine if lifestyle changes are not enough to get your blood pressure under control, and if: ? Your top number is above 130. ? Your bottom number is above  80.  Your personal target blood pressure may vary. Follow these instructions at home: Eating and drinking   If told, follow the DASH eating plan. To follow this plan: ? Fill one half of your plate at each meal with fruits and vegetables. ? Fill one fourth of your plate at each meal with whole grains. Whole grains include whole-wheat pasta, brown rice, and whole-grain bread. ? Eat or drink low-fat dairy products, such as skim milk or low-fat yogurt. ? Fill one fourth of your plate at each meal with low-fat (lean) proteins. Low-fat proteins include fish, chicken without skin, eggs, beans, and tofu. ? Avoid fatty meat, cured and processed meat, or chicken with skin. ? Avoid pre-made or processed food.  Eat less than 1,500 mg of salt each day.  Do not drink alcohol if: ? Your doctor tells you not to drink. ? You are pregnant, may be pregnant, or are planning to become pregnant.  If you drink alcohol: ? Limit how much you use to:  0-1 drink a day for women.  0-2 drinks a day for men. ? Be aware of how much alcohol is in your drink. In the [place] one drink equals one 12 oz bottle of beer (355 mL), one 5 oz glass of wine (148 mL), or one 1 oz glass of hard liquor (44 mL). Lifestyle   Work with your doctor to stay at a healthy weight or to lose weight. Ask your doctor what the best weight is for you.  Get at least 30 minutes of exercise most   days of the week. This may include walking, swimming, or biking.  Get at least 30 minutes of exercise that strengthens your muscles (resistance exercise) at least 3 days a week. This may include lifting weights or doing Pilates.  Do not use any products that contain nicotine or tobacco, such as cigarettes, e-cigarettes, and chewing tobacco. If you need help quitting, ask your doctor.  Check your blood pressure at home as told by your doctor.  Keep all follow-up visits as told by your doctor. This is important. Medicines  Take over-the-counter  and prescription medicines only as told by your doctor. Follow directions carefully.  Do not skip doses of blood pressure medicine. The medicine does not work as well if you skip doses. Skipping doses also puts you at risk for problems.  Ask your doctor about side effects or reactions to medicines that you should watch for. Contact a doctor if you:  Think you are having a reaction to the medicine you are taking.  Have headaches that keep coming back (recurring).  Feel dizzy.  Have swelling in your ankles.  Have trouble with your vision. Get help right away if you:  Get a very bad headache.  Start to feel mixed up (confused).  Feel weak or numb.  Feel faint.  Have very bad pain in your: ? Chest. ? Belly (abdomen).  Throw up more than once.  Have trouble breathing. Summary  Hypertension is another name for high blood pressure.  High blood pressure forces your heart to work harder to pump blood.  For most people, a normal blood pressure is less than 120/80.  Making healthy choices can help lower blood pressure. If your blood pressure does not get lower with healthy choices, you may need to take medicine. This information is not intended to replace advice given to you by your health care provider. Make sure you discuss any questions you have with your health care provider. Document Revised: 12/14/2017 Document Reviewed: 12/14/2017 Elsevier Patient Education  2020 [employer].   Smoking Tobacco Information, Adult Smoking tobacco can be harmful to your health. Tobacco contains a poisonous (toxic), colorless chemical called nicotine. Nicotine is addictive. It changes the brain and can make it hard to stop smoking. Tobacco also has other toxic chemicals that can hurt your body and raise your risk of many cancers. How can smoking tobacco affect me? Smoking tobacco puts you at risk for: Cancer. Smoking is most commonly associated with lung cancer, but can also lead to  cancer in other parts of the body. Chronic obstructive pulmonary disease (COPD). This is a long-term lung condition that makes it hard to breathe. It also gets worse over time. High blood pressure (hypertension), heart disease, stroke, or heart attack. Lung infections, such as pneumonia. Cataracts. This is when the lenses in the eyes become clouded. Digestive problems. This may include peptic ulcers, heartburn, and gastroesophageal reflux disease (GERD). Oral health problems, such as gum disease and tooth loss. Loss of taste and smell. Smoking can affect your appearance by causing: Wrinkles. Yellow or stained teeth, fingers, and fingernails. Smoking tobacco can also affect your social life, because: It may be challenging to find places to smoke when away from home. Many workplaces, [employer], hotels, and public places are tobacco-free. Smoking is expensive. This is due to the cost of tobacco and the long-term costs of treating health problems from smoking. Secondhand smoke may affect those around you. Secondhand smoke can cause lung cancer, breathing problems, and heart disease. Children of  smokers have a higher risk for: Sudden infant death syndrome (SIDS). Ear infections. Lung infections. If you currently smoke tobacco, quitting now can help you: Lead a longer and healthier life. Look, smell, breathe, and feel better over time. Save money. Protect others from the harms of secondhand smoke. What actions can I take to prevent health problems? Quit smoking  Do not start smoking. Quit if you already do. Make a plan to quit smoking and commit to it. Look for programs to help you and ask your health care provider for recommendations and ideas. Set a date and write down all the reasons you want to quit. Let your friends and family know you are quitting so they can help and support you. Consider finding friends who also want to quit. It can be easier to quit with someone else, so that you  can support each other. Talk with your health care provider about using nicotine replacement medicines to help you quit, such as gum, lozenges, patches, sprays, or pills. Do not replace cigarette smoking with electronic cigarettes, which are commonly called e-cigarettes. The safety of e-cigarettes is not known, and some may contain harmful chemicals. If you try to quit but return to smoking, stay positive. It is common to slip up when you first quit, so take it one day at a time. Be prepared for cravings. When you feel the urge to smoke, chew gum or suck on hard candy. Lifestyle Stay busy and take care of your body. Drink enough fluid to keep your urine pale yellow. Get plenty of exercise and eat a healthy diet. This can help prevent weight gain after quitting. Monitor your eating habits. Quitting smoking can cause you to have a larger appetite than when you smoke. Find ways to relax. Go out with friends or family to a movie or a restaurant where people do not smoke. Ask your health care provider about having regular tests (screenings) to check for cancer. This may include blood tests, imaging tests, and other tests. Find ways to manage your stress, such as meditation, yoga, or exercise. Where to find support To get support to quit smoking, consider: Asking your health care provider for more information and resources. Taking classes to learn more about quitting smoking. Looking for local organizations that offer resources about quitting smoking. Joining a support group for people who want to quit smoking in your local community. Calling the smokefree.gov counselor helpline: 1-800-Quit-Now (414) 294-2846) Where to find more information You may find more information about quitting smoking from: HelpGuide.org: www.helpguide.org [employer]: smokefree.gov American Lung Association: www.lung.org Contact a health care provider if you: Have problems breathing. Notice that your lips, nose, or  fingers turn blue. Have chest pain. Are coughing up blood. Feel faint or you pass out. Have other health changes that cause you to worry. Summary Smoking tobacco can negatively affect your health, the health of those around you, your finances, and your social life. Do not start smoking. Quit if you already do. If you need help quitting, ask your health care provider. Think about joining a support group for people who want to quit smoking in your local community. There are many effective programs that will help you to quit this behavior. This information is not intended to replace advice given to you by your health care provider. Make sure you discuss any questions you have with your health care provider. Document Revised: 12/29/2018 Document Reviewed: 04/20/2016 Elsevier Patient Education  2020 [employer].

## 2020-03-24 NOTE — Progress Notes (Signed)
Integrated Behavioral Health Case Management Referral Note  03/24/2020 Name: Aaron Kennedy MRN: [id number] DOB: 1970-06-27 Aaron Kennedy is a 49 y.o. year old male who sees Barbette Merino, NP for primary care. LCSW was consulted to assess patient's needs and assist the patient with Financial Difficulties related to no income and lack of health coverage.  Interpreter: No.   Interpreter Name & Language: none  Assessment: Patient experiencing Financial constraints related to no income and lack of health coverage. Patient requested assistance with [facility].   Intervention: Provided patient with [employer]. Reviewed application and advised patient on supporting documents to be submitted with the application. Advised patient to follow up with CSW for assistance in scheduling appointment with financial counselor at [facility]) to submit the application if needed. Provided [facility] and CSW contact information.   Review of patient status, including review of consultants reports, relevant laboratory and other test results, and collaboration with appropriate care team members and the patient's provider was performed as part of comprehensive patient evaluation and provision of services.    SDOH (Social Determinants of Health) assessments performed: No    Outpatient Encounter Medications as of 03/24/2020  Medication Sig Note   amitriptyline (ELAVIL) 150 MG tablet Take 1 tablet (150 mg total) by mouth at bedtime.    atorvastatin (LIPITOR) 80 MG tablet TAKE 1 TABLET BY MOUTH DAILY AT 6 PM.    clopidogrel (PLAVIX) 75 MG tablet Take 1 tablet (75 mg total) by mouth daily.    diphenhydrAMINE (BENADRYL) 50 MG tablet Take 1 tablet (50 mg total) by mouth at bedtime as needed for sleep.    gabapentin (NEURONTIN) 600 MG tablet Take 2 tablets (1,200 mg total) by mouth daily.    lisinopril-hydrochlorothiazide (ZESTORETIC) 10-12.5 MG tablet  Take 1 tablet by mouth daily.    metFORMIN (GLUCOPHAGE) 500 MG tablet Take 1 tablet (500 mg total) by mouth 2 (two) times daily with a meal.    metoprolol succinate (TOPROL-XL) 25 MG 24 hr tablet Take 1 tablet (25 mg total) by mouth daily.    [DISCONTINUED] atorvastatin (LIPITOR) 80 MG tablet TAKE 1 TABLET BY MOUTH DAILY AT 6 PM.    [DISCONTINUED] clopidogrel (PLAVIX) 75 MG tablet TAKE 1 TABLET (75 MG TOTAL) BY MOUTH DAILY.    [DISCONTINUED] gabapentin (NEURONTIN) 600 MG tablet Take 2 tablets (1,200 mg total) by mouth daily.    [DISCONTINUED] lisinopril-hydrochlorothiazide (ZESTORETIC) 10-12.5 MG tablet Take 1 tablet by mouth daily.    [DISCONTINUED] metFORMIN (GLUCOPHAGE) 500 MG tablet Take 1 tablet (500 mg total) by mouth 2 (two) times daily with a meal. (Patient not taking: Reported on 03/24/2020) 02/06/2019: Taking differently. Taking 500 mg every other day.   [DISCONTINUED] metoprolol succinate (TOPROL-XL) 25 MG 24 hr tablet Take 1 tablet (25 mg total) by mouth daily.    [DISCONTINUED] traZODone (DESYREL) 100 MG tablet Take 1 tablet (100 mg total) by mouth at bedtime as needed for sleep. (Patient not taking: Reported on 06/23/2019) 12/20/2019: Patient said this med makes him feel crazy   Facility-Administered Encounter Medications as of 03/24/2020  Medication   ketorolac (TORADOL) injection 60 mg    Goals Addressed   None      Follow up Plan: 1. CSW available from clinic as needed  Abigail Butts, LCSW [facility] [phone]

## 2020-03-25 LAB — COMP. METABOLIC PANEL (12)
AST: 13 IU/L (ref 0–40)
Albumin/Globulin Ratio: 1.7 (ref 1.2–2.2)
Albumin: 4.3 g/dL (ref 4.0–5.0)
Alkaline Phosphatase: 127 IU/L — ABNORMAL HIGH (ref 44–121)
BUN/Creatinine Ratio: 11 (ref 9–20)
BUN: 11 mg/dL (ref 6–24)
Bilirubin Total: 0.4 mg/dL (ref 0.0–1.2)
Calcium: 8.9 mg/dL (ref 8.7–10.2)
Chloride: 103 mmol/L (ref 96–106)
Creatinine, Ser: 0.98 mg/dL (ref 0.76–1.27)
GFR calc Af Amer: 104 mL/min/{1.73_m2} (ref >59)
GFR calc non Af Amer: 90 mL/min/{1.73_m2} (ref >59)
Globulin, Total: 2.6 g/dL (ref 1.5–4.5)
Glucose: 135 mg/dL — ABNORMAL HIGH (ref 65–99)
Potassium: 4 mmol/L (ref 3.5–5.2)
Sodium: 139 mmol/L (ref 134–144)
Total Protein: 6.9 g/dL (ref 6.0–8.5)

## 2020-04-28 MED FILL — GABAPENTIN 600 MG TABLET: 600 | 30 days supply | Qty: 60 | Fill #1

## 2020-04-28 MED FILL — METOPROLOL SUCCINATE ER 25: 25 | 30 days supply | Qty: 30 | Fill #1

## 2020-04-28 MED FILL — METFORMIN HCL 500 MG TABS: 500 | 30 days supply | Qty: 60 | Fill #1

## 2020-04-28 MED FILL — LISINOPRIL-HCTZ 10-12.5 MG: 10-12.5 | 30 days supply | Qty: 30 | Fill #1

## 2020-06-04 MED FILL — CLOPIDOGREL 75 MG TABLET: 75 | 30 days supply | Qty: 30 | Fill #1

## 2020-06-04 MED FILL — METOPROLOL SUCCINATE ER 25: 25 | 30 days supply | Qty: 30 | Fill #2

## 2020-06-04 MED FILL — LISINOPRIL-HCTZ 10-12.5 MG: 10-12.5 | 30 days supply | Qty: 30 | Fill #2

## 2020-07-10 MED FILL — GABAPENTIN 600 MG TABLET: 600 | 30 days supply | Qty: 60 | Fill #2

## 2020-07-10 MED FILL — CLOPIDOGREL 75 MG TABLET: 75 | 30 days supply | Qty: 30 | Fill #2

## 2020-07-10 MED FILL — METOPROLOL SUCCINATE ER 25: 25 | 30 days supply | Qty: 30 | Fill #3

## 2020-07-10 MED FILL — LISINOPRIL-HCTZ 10-12.5 MG: 10-12.5 | 30 days supply | Qty: 30 | Fill #3

## 2020-08-13 ENCOUNTER — Other Ambulatory Visit: Payer: Self-pay

## 2020-08-13 MED FILL — Gabapentin Tab 600 MG: ORAL | 30 days supply | Qty: 60 | Fill #0 | Status: AC

## 2020-08-13 MED FILL — Lisinopril & Hydrochlorothiazide Tab 10-12.5 MG: ORAL | 30 days supply | Qty: 30 | Fill #0 | Status: AC

## 2020-08-13 MED FILL — Metoprolol Succinate Tab ER 24HR 25 MG (Tartrate Equiv): ORAL | 30 days supply | Qty: 30 | Fill #0 | Status: AC

## 2020-08-13 MED FILL — Metformin HCl Tab 500 MG: ORAL | 30 days supply | Qty: 60 | Fill #0 | Status: AC

## 2020-08-13 MED FILL — Clopidogrel Bisulfate Tab 75 MG (Base Equiv): ORAL | 30 days supply | Qty: 30 | Fill #0 | Status: AC

## 2020-08-14 ENCOUNTER — Other Ambulatory Visit: Payer: Self-pay

## 2020-09-17 ENCOUNTER — Other Ambulatory Visit: Payer: Self-pay

## 2020-09-17 MED FILL — Gabapentin Tab 600 MG: ORAL | 30 days supply | Qty: 60 | Fill #1 | Status: AC

## 2020-09-17 MED FILL — Metoprolol Succinate Tab ER 24HR 25 MG (Tartrate Equiv): ORAL | 30 days supply | Qty: 30 | Fill #1 | Status: AC

## 2020-09-17 MED FILL — Metformin HCl Tab 500 MG: ORAL | 30 days supply | Qty: 60 | Fill #1 | Status: AC

## 2020-09-17 MED FILL — Lisinopril & Hydrochlorothiazide Tab 10-12.5 MG: ORAL | 30 days supply | Qty: 30 | Fill #1 | Status: AC

## 2020-09-22 ENCOUNTER — Telehealth (INDEPENDENT_AMBULATORY_CARE_PROVIDER_SITE_OTHER): Payer: Self-pay | Admitting: Nurse Practitioner

## 2020-09-22 ENCOUNTER — Encounter: Payer: Self-pay | Admitting: Nurse Practitioner

## 2020-09-22 ENCOUNTER — Other Ambulatory Visit: Payer: Self-pay

## 2020-09-22 DIAGNOSIS — I10 Essential (primary) hypertension: Secondary | ICD-10-CM

## 2020-09-22 DIAGNOSIS — E118 Type 2 diabetes mellitus with unspecified complications: Secondary | ICD-10-CM

## 2020-09-22 DIAGNOSIS — E785 Hyperlipidemia, unspecified: Secondary | ICD-10-CM

## 2020-09-22 DIAGNOSIS — Z8719 Personal history of other diseases of the digestive system: Secondary | ICD-10-CM

## 2020-09-22 DIAGNOSIS — Z9889 Other specified postprocedural states: Secondary | ICD-10-CM

## 2020-09-22 DIAGNOSIS — G629 Polyneuropathy, unspecified: Secondary | ICD-10-CM

## 2020-09-22 NOTE — Patient Instructions (Signed)
Managing Your Hypertension Hypertension, also called high blood pressure, is when the force of the blood pressing against the walls of the arteries is too strong. Arteries are blood vessels that carry blood from your heart throughout your body. Hypertension forces the heart to work harder to pump blood and may cause the arteries to become narrow or stiff. Understanding blood pressure readings Your personal target blood pressure may vary depending on your medical conditions, your age, and other factors. A blood pressure reading includes a higher number over a lower number. Ideally, your blood pressure should be below 120/80. You should know that:  The first, or top, number is called the systolic pressure. It is a measure of the pressure in your arteries as your heart beats.  The second, or bottom number, is called the diastolic pressure. It is a measure of the pressure in your arteries as the heart relaxes. Blood pressure is classified into four stages. Based on your blood pressure reading, your health care provider may use the following stages to determine what type of treatment you need, if any. Systolic pressure and diastolic pressure are measured in a unit called mmHg. Normal  Systolic pressure: below 120.  Diastolic pressure: below 80. Elevated  Systolic pressure: 120-129.  Diastolic pressure: below 80. Hypertension stage 1  Systolic pressure: 130-139.  Diastolic pressure: 80-89. Hypertension stage 2  Systolic pressure: 140 or above.  Diastolic pressure: 90 or above. How can this condition affect me? Managing your hypertension is an important responsibility. Over time, hypertension can damage the arteries and decrease blood flow to important parts of the body, including the brain, heart, and kidneys. Having untreated or uncontrolled hypertension can lead to:  A heart attack.  A stroke.  A weakened blood vessel (aneurysm).  Heart failure.  Kidney damage.  Eye  damage.  Metabolic syndrome.  Memory and concentration problems.  Vascular dementia. What actions can I take to manage this condition? Hypertension can be managed by making lifestyle changes and possibly by taking medicines. Your health care provider will help you make a plan to bring your blood pressure within a normal range. Nutrition  Eat a diet that is high in fiber and potassium, and low in salt (sodium), added sugar, and fat. An example eating plan is called the Dietary Approaches to Stop Hypertension (DASH) diet. To eat this way: ? Eat plenty of fresh fruits and vegetables. Try to fill one-half of your plate at each meal with fruits and vegetables. ? Eat whole grains, such as whole-wheat pasta, brown rice, or whole-grain bread. Fill about one-fourth of your plate with whole grains. ? Eat low-fat dairy products. ? Avoid fatty cuts of meat, processed or cured meats, and poultry with skin. Fill about one-fourth of your plate with lean proteins such as fish, chicken without skin, beans, eggs, and tofu. ? Avoid pre-made and processed foods. These tend to be higher in sodium, added sugar, and fat.  Reduce your daily sodium intake. Most people with hypertension should eat less than 1,500 mg of sodium a day.   Lifestyle  Work with your health care provider to maintain a healthy body weight or to lose weight. Ask what an ideal weight is for you.  Get at least 30 minutes of exercise that causes your heart to beat faster (aerobic exercise) most days of the week. Activities may include walking, swimming, or biking.  Include exercise to strengthen your muscles (resistance exercise), such as weight lifting, as part of your weekly exercise routine. Try   to do these types of exercises for 30 minutes at least 3 days a week.  Do not use any products that contain nicotine or tobacco, such as cigarettes, e-cigarettes, and chewing tobacco. If you need help quitting, ask your health care  provider.  Control any long-term (chronic) conditions you have, such as high cholesterol or diabetes.  Identify your sources of stress and find ways to manage stress. This may include meditation, deep breathing, or making time for fun activities.   Alcohol use  Do not drink alcohol if: ? Your health care provider tells you not to drink. ? You are pregnant, may be pregnant, or are planning to become pregnant.  If you drink alcohol: ? Limit how much you use to:  0-1 drink a day for women.  0-2 drinks a day for men. ? Be aware of how much alcohol is in your drink. In the [place] one drink equals one 12 oz bottle of beer (355 mL), one 5 oz glass of wine (148 mL), or one 1 oz glass of hard liquor (44 mL). Medicines Your health care provider may prescribe medicine if lifestyle changes are not enough to get your blood pressure under control and if:  Your systolic blood pressure is 130 or higher.  Your diastolic blood pressure is 80 or higher. Take medicines only as told by your health care provider. Follow the directions carefully. Blood pressure medicines must be taken as told by your health care provider. The medicine does not work as well when you skip doses. Skipping doses also puts you at risk for problems. Monitoring Before you monitor your blood pressure:  Do not smoke, drink caffeinated beverages, or exercise within 30 minutes before taking a measurement.  Use the bathroom and empty your bladder (urinate).  Sit quietly for at least 5 minutes before taking measurements. Monitor your blood pressure at home as told by your health care provider. To do this:  Sit with your back straight and supported.  Place your feet flat on the floor. Do not cross your legs.  Support your arm on a flat surface, such as a table. Make sure your upper arm is at heart level.  Each time you measure, take two or three readings one minute apart and record the results. You may also need to have your  blood pressure checked regularly by your health care provider.   General information  Talk with your health care provider about your diet, exercise habits, and other lifestyle factors that may be contributing to hypertension.  Review all the medicines you take with your health care provider because there may be side effects or interactions.  Keep all visits as told by your health care provider. Your health care provider can help you create and adjust your plan for managing your high blood pressure. Where to find more information  National Heart, Lung, and Blood Institute: www.nhlbi.nih.gov  American Heart Association: www.heart.org Contact a health care provider if:  You think you are having a reaction to medicines you have taken.  You have repeated (recurrent) headaches.  You feel dizzy.  You have swelling in your ankles.  You have trouble with your vision. Get help right away if:  You develop a severe headache or confusion.  You have unusual weakness or numbness, or you feel faint.  You have severe pain in your chest or abdomen.  You vomit repeatedly.  You have trouble breathing. These symptoms may represent a serious problem that is an emergency. Do not wait   to see if the symptoms will go away. Get medical help right away. Call your local emergency services (911 in the [place] Do not drive yourself to the hospital. Summary  Hypertension is when the force of blood pumping through your arteries is too strong. If this condition is not controlled, it may put you at risk for serious complications.  Your personal target blood pressure may vary depending on your medical conditions, your age, and other factors. For most people, a normal blood pressure is less than 120/80.  Hypertension is managed by lifestyle changes, medicines, or both.  Lifestyle changes to help manage hypertension include losing weight, eating a healthy, low-sodium diet, exercising more, stopping smoking, and  limiting alcohol. This information is not intended to replace advice given to you by your health care provider. Make sure you discuss any questions you have with your health care provider. Document Revised: 05/11/2019 Document Reviewed: 03/06/2019 Elsevier Patient Education  2021 Elsevier Inc.  

## 2020-09-22 NOTE — Progress Notes (Signed)
[facility] Patient [facility] [address] [place], [place]  [address] Phone:  [phone]   Fax:  [phone]  Virtual Visit via Telephone Note  I connected with Aaron Kennedy on 09/22/20 at  9:00 AM EDT by telephone and verified that I am speaking with the correct person using two identifiers.   I discussed the limitations, risks, security and privacy concerns of performing an evaluation and management service by telephone and the availability of in person appointments. I also discussed with the patient that there may be a patient responsible charge related to this service. The patient expressed understanding and agreed to proceed.  Patient home Provider home  History of Present Illness: He has been having left arm tingling over the last month that is worse at night. He reports that he was taking his Plavix every other day. So he has started taking the Plavix daily. He has a history of Stroke with right sided weakness. He is also having left calf pain. He is reports using gabapentin 600 mg daily. Denies headache, dizziness, visual changes, shortness of breath, dyspnea on exertion, chest pain, nausea, vomiting or any edema.  He is SP hernia surgery. He has continues drainage from the incision site.  He reports this has been going on since 2019.  He has 2 performed a daily dressing change.  He was referred to surgery in the past however due to his lack of insurance there was an upfront cost of $160.  He reports that at that time he cannot afford this however due to the chronic drainage he would like this to be repaired.   He did note that his sister died 3 weeks ago prior to that a planned surgery for hiatal hernia.  He reports tolerating this well however he is family oriented and withdrawn encounter sister 3 times per day.    He reports that he does continue to monitor his BP 129/70  Observations/Objective: Telephone visit no exan  Assessment and Plan: Assessment  Primary  Diagnosis & Pertinent Problem List: The primary encounter diagnosis was Essential hypertension. Diagnoses of DM type 2, controlled, with complication (HCC), Hyperlipidemia, unspecified hyperlipidemia type, Neuropathy, and S/P hernia repair were also pertinent to this visit.  Visit Diagnosis: 1. Essential hypertension  Stable Continue with current regimen.  No changes warranted. Good patient compliance.  2. DM type 2, controlled, with complication (HCC)  Stable A1c <7%  Encourage compliance with current treatment regimen Encourage regular CBG monitoring Encourage contacting office if excessive hyperglycemia and or hypoglycemia Lifestyle modification with healthy diet (fewer calories, more high fiber foods, whole grains and non-starchy vegetables, lower fat meat and fish, low-fat diary include healthy oils) regular exercise (physical activity) and weight loss  3. Hyperlipidemia, unspecified hyperlipidemia type  Persistent on Atorvastatin 80 mg continue with dietary changes  4. Neuropathy  Encourage using gabapentin 600 mg twice daily to see if this will help improve symptoms  5. S/P hernia repair  New referral to [place] surgery due to patient not completely establishing care    Follow Up Instructions: 3 mons   I discussed the assessment and treatment plan with the patient. The patient was provided an opportunity to ask questions and all were answered. The patient agreed with the plan and demonstrated an understanding of the instructions.   The patient was advised to call back or seek an in-person evaluation if the symptoms worsen or if the condition fails to improve as anticipated.  I provided 13 minutes  of telephone visit time during this encounter.   Barbette Merino, NP

## 2020-10-17 ENCOUNTER — Other Ambulatory Visit: Payer: Self-pay

## 2020-10-17 MED FILL — Metformin HCl Tab 500 MG: ORAL | 30 days supply | Qty: 60 | Fill #2 | Status: AC

## 2020-10-17 MED FILL — Lisinopril & Hydrochlorothiazide Tab 10-12.5 MG: ORAL | 30 days supply | Qty: 30 | Fill #2 | Status: AC

## 2020-10-17 MED FILL — Gabapentin Tab 600 MG: ORAL | 30 days supply | Qty: 60 | Fill #2 | Status: AC

## 2020-10-17 MED FILL — Clopidogrel Bisulfate Tab 75 MG (Base Equiv): ORAL | 30 days supply | Qty: 30 | Fill #1 | Status: AC

## 2020-10-17 MED FILL — Metoprolol Succinate Tab ER 24HR 25 MG (Tartrate Equiv): ORAL | 30 days supply | Qty: 30 | Fill #2 | Status: AC

## 2020-11-20 ENCOUNTER — Other Ambulatory Visit: Payer: Self-pay

## 2020-11-20 MED FILL — Metoprolol Succinate Tab ER 24HR 25 MG (Tartrate Equiv): ORAL | 30 days supply | Qty: 30 | Fill #3 | Status: AC

## 2020-11-20 MED FILL — Gabapentin Tab 600 MG: ORAL | 30 days supply | Qty: 60 | Fill #3 | Status: AC

## 2020-11-20 MED FILL — Clopidogrel Bisulfate Tab 75 MG (Base Equiv): ORAL | 30 days supply | Qty: 30 | Fill #2 | Status: AC

## 2020-11-20 MED FILL — Lisinopril & Hydrochlorothiazide Tab 10-12.5 MG: ORAL | 30 days supply | Qty: 30 | Fill #3 | Status: AC

## 2020-11-21 ENCOUNTER — Other Ambulatory Visit: Payer: Self-pay

## 2020-12-24 ENCOUNTER — Other Ambulatory Visit: Payer: Self-pay

## 2020-12-24 MED FILL — Metformin HCl Tab 500 MG: ORAL | 30 days supply | Qty: 60 | Fill #3 | Status: AC

## 2020-12-24 MED FILL — Clopidogrel Bisulfate Tab 75 MG (Base Equiv): ORAL | 30 days supply | Qty: 30 | Fill #3 | Status: AC

## 2020-12-24 MED FILL — Metoprolol Succinate Tab ER 24HR 25 MG (Tartrate Equiv): ORAL | 30 days supply | Qty: 30 | Fill #4 | Status: AC

## 2020-12-24 MED FILL — Lisinopril & Hydrochlorothiazide Tab 10-12.5 MG: ORAL | 30 days supply | Qty: 30 | Fill #4 | Status: AC

## 2020-12-24 MED FILL — Gabapentin Tab 600 MG: ORAL | 30 days supply | Qty: 60 | Fill #4 | Status: AC

## 2020-12-25 ENCOUNTER — Ambulatory Visit: Payer: Self-pay | Admitting: Nurse Practitioner

## 2020-12-29 ENCOUNTER — Other Ambulatory Visit: Payer: Self-pay

## 2021-02-03 ENCOUNTER — Other Ambulatory Visit: Payer: Self-pay

## 2021-02-03 MED FILL — Gabapentin Tab 600 MG: ORAL | 30 days supply | Qty: 60 | Fill #5 | Status: AC

## 2021-02-03 MED FILL — Lisinopril & Hydrochlorothiazide Tab 10-12.5 MG: ORAL | 30 days supply | Qty: 30 | Fill #5 | Status: AC

## 2021-02-03 MED FILL — Metoprolol Succinate Tab ER 24HR 25 MG (Tartrate Equiv): ORAL | 30 days supply | Qty: 30 | Fill #5 | Status: AC

## 2021-02-06 ENCOUNTER — Other Ambulatory Visit: Payer: Self-pay

## 2021-02-16 ENCOUNTER — Emergency Department (HOSPITAL_COMMUNITY): Payer: Self-pay

## 2021-02-16 ENCOUNTER — Other Ambulatory Visit: Payer: Self-pay

## 2021-02-16 ENCOUNTER — Encounter (HOSPITAL_COMMUNITY): Payer: Self-pay | Admitting: *Deleted

## 2021-02-16 ENCOUNTER — Emergency Department (HOSPITAL_COMMUNITY)
Admission: EM | Admit: 2021-02-16 | Discharge: 2021-02-16 | Disposition: A | Discharge status: Home / Self Care | Payer: Self-pay | Attending: Emergency Medicine | Admitting: Emergency Medicine

## 2021-02-16 DIAGNOSIS — S42035A Nondisplaced fracture of lateral end of left clavicle, initial encounter for closed fracture: Secondary | ICD-10-CM

## 2021-02-16 DIAGNOSIS — S42032A Displaced fracture of lateral end of left clavicle, initial encounter for closed fracture: Secondary | ICD-10-CM | POA: Insufficient documentation

## 2021-02-16 DIAGNOSIS — Z79899 Other long term (current) drug therapy: Secondary | ICD-10-CM | POA: Insufficient documentation

## 2021-02-16 DIAGNOSIS — Y92410 Unspecified street and highway as the place of occurrence of the external cause: Secondary | ICD-10-CM | POA: Insufficient documentation

## 2021-02-16 DIAGNOSIS — F17210 Nicotine dependence, cigarettes, uncomplicated: Secondary | ICD-10-CM | POA: Insufficient documentation

## 2021-02-16 DIAGNOSIS — I10 Essential (primary) hypertension: Secondary | ICD-10-CM | POA: Insufficient documentation
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
## 2021-02-16 NOTE — Discharge Instructions (Signed)
Your x-ray did show a subtle fracture to your clavicle.  It is not displaced.  Continue taking Tylenol and Motrin for pain.  I have included information for orthopedic follow-up.  Call the clinic and schedule an appointment.
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
## 2021-02-25 ENCOUNTER — Other Ambulatory Visit: Payer: Self-pay

## 2021-02-25 ENCOUNTER — Ambulatory Visit (INDEPENDENT_AMBULATORY_CARE_PROVIDER_SITE_OTHER): Payer: Self-pay | Admitting: Nurse Practitioner

## 2021-02-25 ENCOUNTER — Encounter: Payer: Self-pay | Admitting: Nurse Practitioner

## 2021-02-25 VITALS — BP 123/85 | HR 69 | Temp 97.7°F | Ht 72.0 in | Wt 232.1 lb

## 2021-02-25 DIAGNOSIS — E782 Mixed hyperlipidemia: Secondary | ICD-10-CM

## 2021-02-25 DIAGNOSIS — R569 Unspecified convulsions: Secondary | ICD-10-CM

## 2021-02-25 DIAGNOSIS — Z8673 Personal history of transient ischemic attack (TIA), and cerebral infarction without residual deficits: Secondary | ICD-10-CM

## 2021-02-25 DIAGNOSIS — I739 Peripheral vascular disease, unspecified: Secondary | ICD-10-CM

## 2021-02-25 DIAGNOSIS — Z23 Encounter for immunization: Secondary | ICD-10-CM

## 2021-02-25 DIAGNOSIS — E118 Type 2 diabetes mellitus with unspecified complications: Secondary | ICD-10-CM

## 2021-02-25 DIAGNOSIS — I10 Essential (primary) hypertension: Secondary | ICD-10-CM

## 2021-02-25 DIAGNOSIS — S42002D Fracture of unspecified part of left clavicle, subsequent encounter for fracture with routine healing: Secondary | ICD-10-CM

## 2021-02-25 DIAGNOSIS — Z6831 Body mass index (BMI) 31.0-31.9, adult: Secondary | ICD-10-CM

## 2021-02-25 LAB — POCT URINALYSIS DIP (CLINITEK)
Bilirubin, UA: NEGATIVE
Blood, UA: NEGATIVE
Glucose, UA: NEGATIVE mg/dL
Ketones, POC UA: NEGATIVE mg/dL
Leukocytes, UA: NEGATIVE
Nitrite, UA: NEGATIVE
POC PROTEIN,UA: NEGATIVE
Spec Grav, UA: 1.02 (ref 1.010–1.025)
Urobilinogen, UA: 0.2 E.U./dL
pH, UA: 6.5 (ref 5.0–8.0)

## 2021-02-25 LAB — POCT GLYCOSYLATED HEMOGLOBIN (HGB A1C)
HbA1c POC (<> result, manual entry): 6.9 % (ref 4.0–5.6)
HbA1c, POC (controlled diabetic range): 6.9 % (ref 0.0–7.0)
HbA1c, POC (prediabetic range): 6.9 % — AB (ref 5.7–6.4)
Hemoglobin A1C: 6.9 % — AB (ref 4.0–5.6)

## 2021-02-25 MED ORDER — KETOROLAC TROMETHAMINE 60 MG/2ML IM SOLN
60.0000 mg | Freq: Once | INTRAMUSCULAR | Status: AC
  Administered 2021-02-25: 60 mg via INTRAMUSCULAR

## 2021-02-25 NOTE — Patient Instructions (Signed)
Managing Your Hypertension Hypertension, also called high blood pressure, is when the force of the blood pressing against the walls of the arteries is too strong. Arteries are blood vessels that carry blood from your heart throughout your body. Hypertension forces the heart to work harder to pump blood and may cause the arteries to become narrow or stiff. Understanding blood pressure readings Your personal target blood pressure may vary depending on your medical conditions, your age, and other factors. A blood pressure reading includes a higher number over a lower number. Ideally, your blood pressure should be below 120/80. You should know that: The first, or top, number is called the systolic pressure. It is a measure of the pressure in your arteries as your heart beats. The second, or bottom number, is called the diastolic pressure. It is a measure of the pressure in your arteries as the heart relaxes. Blood pressure is classified into four stages. Based on your blood pressure reading, your health care provider may use the following stages to determine what type of treatment you need, if any. Systolic pressure and diastolic pressure are measured in a unit called mmHg. Normal Systolic pressure: below 120. Diastolic pressure: below 80. Elevated Systolic pressure: 120-129. Diastolic pressure: below 80. Hypertension stage 1 Systolic pressure: 130-139. Diastolic pressure: 80-89. Hypertension stage 2 Systolic pressure: 140 or above. Diastolic pressure: 90 or above. How can this condition affect me? Managing your hypertension is an important responsibility. Over time, hypertension can damage the arteries and decrease blood flow to important parts of the body, including the brain, heart, and kidneys. Having untreated or uncontrolled hypertension can lead to: A heart attack. A stroke. A weakened blood vessel (aneurysm). Heart failure. Kidney damage. Eye damage. Metabolic syndrome. Memory and  concentration problems. Vascular dementia. What actions can I take to manage this condition? Hypertension can be managed by making lifestyle changes and possibly by taking medicines. Your health care provider will help you make a plan to bring your blood pressure within a normal range. Nutrition  Eat a diet that is high in fiber and potassium, and low in salt (sodium), added sugar, and fat. An example eating plan is called the Dietary Approaches to Stop Hypertension (DASH) diet. To eat this way: Eat plenty of fresh fruits and vegetables. Try to fill one-half of your plate at each meal with fruits and vegetables. Eat whole grains, such as whole-wheat pasta, brown rice, or whole-grain bread. Fill about one-fourth of your plate with whole grains. Eat low-fat dairy products. Avoid fatty cuts of meat, processed or cured meats, and poultry with skin. Fill about one-fourth of your plate with lean proteins such as fish, chicken without skin, beans, eggs, and tofu. Avoid pre-made and processed foods. These tend to be higher in sodium, added sugar, and fat. Reduce your daily sodium intake. Most people with hypertension should eat less than 1,500 mg of sodium a day. Lifestyle  Work with your health care provider to maintain a healthy body weight or to lose weight. Ask what an ideal weight is for you. Get at least 30 minutes of exercise that causes your heart to beat faster (aerobic exercise) most days of the week. Activities may include walking, swimming, or biking. Include exercise to strengthen your muscles (resistance exercise), such as weight lifting, as part of your weekly exercise routine. Try to do these types of exercises for 30 minutes at least 3 days a week. Do not use any products that contain nicotine or tobacco, such as cigarettes, e-cigarettes,   and chewing tobacco. If you need help quitting, ask your health care provider. Control any long-term (chronic) conditions you have, such as high  cholesterol or diabetes. Identify your sources of stress and find ways to manage stress. This may include meditation, deep breathing, or making time for fun activities. Alcohol use Do not drink alcohol if: Your health care provider tells you not to drink. You are pregnant, may be pregnant, or are planning to become pregnant. If you drink alcohol: Limit how much you use to: 0-1 drink a day for women. 0-2 drinks a day for men. Be aware of how much alcohol is in your drink. In the [place] one drink equals one 12 oz bottle of beer (355 mL), one 5 oz glass of wine (148 mL), or one 1 oz glass of hard liquor (44 mL). Medicines Your health care provider may prescribe medicine if lifestyle changes are not enough to get your blood pressure under control and if: Your systolic blood pressure is 130 or higher. Your diastolic blood pressure is 80 or higher. Take medicines only as told by your health care provider. Follow the directions carefully. Blood pressure medicines must be taken as told by your health care provider. The medicine does not work as well when you skip doses. Skipping doses also puts you at risk for problems. Monitoring Before you monitor your blood pressure: Do not smoke, drink caffeinated beverages, or exercise within 30 minutes before taking a measurement. Use the bathroom and empty your bladder (urinate). Sit quietly for at least 5 minutes before taking measurements. Monitor your blood pressure at home as told by your health care provider. To do this: Sit with your back straight and supported. Place your feet flat on the floor. Do not cross your legs. Support your arm on a flat surface, such as a table. Make sure your upper arm is at heart level. Each time you measure, take two or three readings one minute apart and record the results. You may also need to have your blood pressure checked regularly by your health care provider. General information Talk with your health care  provider about your diet, exercise habits, and other lifestyle factors that may be contributing to hypertension. Review all the medicines you take with your health care provider because there may be side effects or interactions. Keep all visits as told by your health care provider. Your health care provider can help you create and adjust your plan for managing your high blood pressure. Where to find more information National Heart, Lung, and Blood Institute: www.nhlbi.nih.gov American Heart Association: www.heart.org Contact a health care provider if: You think you are having a reaction to medicines you have taken. You have repeated (recurrent) headaches. You feel dizzy. You have swelling in your ankles. You have trouble with your vision. Get help right away if: You develop a severe headache or confusion. You have unusual weakness or numbness, or you feel faint. You have severe pain in your chest or abdomen. You vomit repeatedly. You have trouble breathing. These symptoms may represent a serious problem that is an emergency. Do not wait to see if the symptoms will go away. Get medical help right away. Call your local emergency services (911 in the [place] Do not drive yourself to the hospital. Summary Hypertension is when the force of blood pumping through your arteries is too strong. If this condition is not controlled, it may put you at risk for serious complications. Your personal target blood pressure may vary depending on   your medical conditions, your age, and other factors. For most people, a normal blood pressure is less than 120/80. Hypertension is managed by lifestyle changes, medicines, or both. Lifestyle changes to help manage hypertension include losing weight, eating a healthy, low-sodium diet, exercising more, stopping smoking, and limiting alcohol. This information is not intended to replace advice given to you by your health care provider. Make sure you discuss any questions  you have with your health care provider. Document Revised: 04/23/2019 Document Reviewed: 03/06/2019 Elsevier Patient Education  2022 Elsevier Inc.  
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
## 2021-02-26 LAB — COMP. METABOLIC PANEL (12)
AST: 14 IU/L (ref 0–40)
Albumin/Globulin Ratio: 2 (ref 1.2–2.2)
Albumin: 4.5 g/dL (ref 4.0–5.0)
Alkaline Phosphatase: 129 IU/L — ABNORMAL HIGH (ref 44–121)
BUN/Creatinine Ratio: 11 (ref 9–20)
BUN: 11 mg/dL (ref 6–24)
Bilirubin Total: 0.4 mg/dL (ref 0.0–1.2)
Calcium: 9.2 mg/dL (ref 8.7–10.2)
Chloride: 101 mmol/L (ref 96–106)
Creatinine, Ser: 1 mg/dL (ref 0.76–1.27)
Globulin, Total: 2.3 g/dL (ref 1.5–4.5)
Glucose: 95 mg/dL (ref 70–99)
Potassium: 4.2 mmol/L (ref 3.5–5.2)
Sodium: 139 mmol/L (ref 134–144)
Total Protein: 6.8 g/dL (ref 6.0–8.5)
eGFR: 92 mL/min/{1.73_m2} (ref >59)

## 2021-02-26 LAB — LIPID PANEL
Chol/HDL Ratio: 8.9 ratio — ABNORMAL HIGH (ref 0.0–5.0)
Cholesterol, Total: 240 mg/dL — ABNORMAL HIGH (ref 100–199)
HDL: 27 mg/dL — ABNORMAL LOW (ref >39)
LDL Chol Calc (NIH): 170 mg/dL — ABNORMAL HIGH (ref 0–99)
Triglycerides: 229 mg/dL — ABNORMAL HIGH (ref 0–149)
VLDL Cholesterol Cal: 43 mg/dL — ABNORMAL HIGH (ref 5–40)

## 2021-03-09 ENCOUNTER — Other Ambulatory Visit: Payer: Self-pay

## 2021-03-09 MED FILL — Gabapentin Tab 600 MG: ORAL | 30 days supply | Qty: 60 | Fill #6 | Status: AC

## 2021-03-09 MED FILL — Lisinopril & Hydrochlorothiazide Tab 10-12.5 MG: ORAL | 30 days supply | Qty: 30 | Fill #6 | Status: AC

## 2021-03-09 MED FILL — Metoprolol Succinate Tab ER 24HR 25 MG (Tartrate Equiv): ORAL | 30 days supply | Qty: 30 | Fill #6 | Status: AC

## 2021-03-09 MED FILL — Atorvastatin Calcium Tab 80 MG (Base Equivalent): ORAL | 30 days supply | Qty: 30 | Fill #0 | Status: AC

## 2021-03-11 ENCOUNTER — Other Ambulatory Visit: Payer: Self-pay

## 2021-04-14 ENCOUNTER — Other Ambulatory Visit: Payer: Self-pay | Admitting: Nurse Practitioner

## 2021-04-14 ENCOUNTER — Other Ambulatory Visit: Payer: Self-pay

## 2021-04-14 DIAGNOSIS — I6302 Cerebral infarction due to thrombosis of basilar artery: Secondary | ICD-10-CM

## 2021-04-14 DIAGNOSIS — E118 Type 2 diabetes mellitus with unspecified complications: Secondary | ICD-10-CM

## 2021-04-14 DIAGNOSIS — I10 Essential (primary) hypertension: Secondary | ICD-10-CM

## 2021-04-14 MED ORDER — LISINOPRIL-HYDROCHLOROTHIAZIDE 10-12.5 MG PO TABS
1.0000 | ORAL_TABLET | Freq: Every day | ORAL | 11 refills | Status: DC
  Filled 2021-04-14 – 2021-05-15 (×2): qty 30, 30d supply, fill #0
  Filled 2021-06-18: qty 30, 30d supply, fill #1
  Filled 2021-07-17: qty 30, 30d supply, fill #2
  Filled 2021-08-21: qty 30, 30d supply, fill #3
  Filled 2021-09-25: qty 30, 30d supply, fill #4

## 2021-04-14 MED ORDER — CLOPIDOGREL BISULFATE 75 MG PO TABS
ORAL_TABLET | Freq: Every day | ORAL | 11 refills | Status: DC
  Filled 2021-04-14 – 2021-05-15 (×2): qty 30, 30d supply, fill #0
  Filled 2021-07-17: qty 30, 30d supply, fill #1

## 2021-04-14 MED ORDER — METFORMIN HCL 500 MG PO TABS
ORAL_TABLET | Freq: Two times a day (BID) | ORAL | 3 refills | Status: DC
  Filled 2021-04-14 – 2021-05-15 (×2): qty 60, 30d supply, fill #0
  Filled 2021-09-25: qty 60, 30d supply, fill #1

## 2021-04-14 MED ORDER — METOPROLOL SUCCINATE ER 25 MG PO TB24
ORAL_TABLET | Freq: Every day | ORAL | 11 refills | Status: DC
  Filled 2021-04-14 – 2021-05-15 (×2): qty 30, 30d supply, fill #0
  Filled 2021-06-18: qty 30, 30d supply, fill #1
  Filled 2021-07-17: qty 30, 30d supply, fill #2
  Filled 2021-08-21: qty 30, 30d supply, fill #3
  Filled 2021-09-25: qty 30, 30d supply, fill #4

## 2021-04-16 ENCOUNTER — Other Ambulatory Visit: Payer: Self-pay

## 2021-05-13 ENCOUNTER — Other Ambulatory Visit: Payer: Self-pay | Admitting: Nurse Practitioner

## 2021-05-13 DIAGNOSIS — E785 Hyperlipidemia, unspecified: Secondary | ICD-10-CM

## 2021-05-13 DIAGNOSIS — G629 Polyneuropathy, unspecified: Secondary | ICD-10-CM

## 2021-05-13 DIAGNOSIS — I10 Essential (primary) hypertension: Secondary | ICD-10-CM

## 2021-05-13 DIAGNOSIS — I6302 Cerebral infarction due to thrombosis of basilar artery: Secondary | ICD-10-CM

## 2021-05-13 DIAGNOSIS — E118 Type 2 diabetes mellitus with unspecified complications: Secondary | ICD-10-CM

## 2021-05-13 NOTE — Telephone Encounter (Signed)
Patient is not a patient at [facility]- Rx refused and note sent to pharmacy to forward to correct provider Requested Prescriptions  Pending Prescriptions Disp Refills   atorvastatin (LIPITOR) 80 MG tablet 30 tablet 11    Sig: TAKE 1 TABLET BY MOUTH DAILY AT 6 PM.     There is no refill protocol information for this order     clopidogrel (PLAVIX) 75 MG tablet 30 tablet 11    Sig: TAKE 1 TABLET (75 MG TOTAL) BY MOUTH DAILY.     There is no refill protocol information for this order     gabapentin (NEURONTIN) 600 MG tablet 60 tablet 11    Sig: TAKE 2 TABLETS (1,200 MG TOTAL) BY MOUTH DAILY.     There is no refill protocol information for this order     lisinopril-hydrochlorothiazide (ZESTORETIC) 10-12.5 MG tablet 30 tablet 11    Sig: TAKE 1 TABLET BY MOUTH DAILY.     There is no refill protocol information for this order     metFORMIN (GLUCOPHAGE) 500 MG tablet 180 tablet 3    Sig: TAKE 1 TABLET (500 MG TOTAL) BY MOUTH 2 (TWO) TIMES DAILY WITH A MEAL.     There is no refill protocol information for this order     metoprolol succinate (TOPROL-XL) 25 MG 24 hr tablet 30 tablet 11    Sig: TAKE 1 TABLET (25 MG TOTAL) BY MOUTH DAILY.     There is no refill protocol information for this order

## 2021-05-13 NOTE — Telephone Encounter (Signed)
Pclopidogrel (PLAVIX) 75 MG tablett is needing refills on  atorvastatin (LIPITOR) 80 MG tablet (Expired) clopidogrel (PLAVIX) 75 MG tablet gabapentin (NEURONTIN) 600 MG tablet (Expired) lisinopril-hydrochlorothiazide (ZESTORETIC) 10-12.5 MG tablet metFORMIN (GLUCOPHAGE) 500 MG tablet metoprolol succinate (TOPROL-XL) 25 MG 24 hr tablet He would like this sent to [facility]

## 2021-05-15 ENCOUNTER — Other Ambulatory Visit: Payer: Self-pay | Admitting: Nurse Practitioner

## 2021-05-15 ENCOUNTER — Other Ambulatory Visit: Payer: Self-pay

## 2021-05-15 DIAGNOSIS — G629 Polyneuropathy, unspecified: Secondary | ICD-10-CM

## 2021-05-15 DIAGNOSIS — E785 Hyperlipidemia, unspecified: Secondary | ICD-10-CM

## 2021-05-15 MED ORDER — ATORVASTATIN CALCIUM 80 MG PO TABS
ORAL_TABLET | ORAL | 0 refills | Status: DC
  Filled 2021-05-15: qty 30, 30d supply, fill #0

## 2021-05-15 MED ORDER — GABAPENTIN 600 MG PO TABS
ORAL_TABLET | ORAL | 0 refills | Status: DC
  Filled 2021-05-15: qty 60, 30d supply, fill #0

## 2021-05-19 ENCOUNTER — Other Ambulatory Visit: Payer: Self-pay

## 2021-05-20 ENCOUNTER — Emergency Department (HOSPITAL_COMMUNITY)
Admission: EM | Admit: 2021-05-20 | Discharge: 2021-05-20 | Disposition: A | Discharge status: Home / Self Care | Payer: Self-pay | Attending: Emergency Medicine | Admitting: Emergency Medicine

## 2021-05-20 ENCOUNTER — Encounter (HOSPITAL_COMMUNITY): Payer: Self-pay

## 2021-05-20 ENCOUNTER — Other Ambulatory Visit: Payer: Self-pay

## 2021-05-20 DIAGNOSIS — W109XXA Fall (on) (from) unspecified stairs and steps, initial encounter: Secondary | ICD-10-CM | POA: Insufficient documentation

## 2021-05-20 DIAGNOSIS — M545 Low back pain, unspecified: Secondary | ICD-10-CM | POA: Insufficient documentation

## 2021-05-20 DIAGNOSIS — M79604 Pain in right leg: Secondary | ICD-10-CM | POA: Insufficient documentation

## 2021-05-20 LAB — URINALYSIS, ROUTINE W REFLEX MICROSCOPIC
Bilirubin Urine: NEGATIVE
Glucose, UA: NEGATIVE mg/dL
Hgb urine dipstick: NEGATIVE
Ketones, ur: NEGATIVE mg/dL
Leukocytes,Ua: NEGATIVE
Nitrite: NEGATIVE
Protein, ur: NEGATIVE mg/dL
Specific Gravity, Urine: 1.02 (ref 1.005–1.030)
pH: 6 (ref 5.0–8.0)

## 2021-05-20 MED ORDER — KETOROLAC TROMETHAMINE 60 MG/2ML IM SOLN
60.0000 mg | Freq: Once | INTRAMUSCULAR | Status: AC
  Administered 2021-05-20: 60 mg via INTRAMUSCULAR
  Filled 2021-05-20: qty 2

## 2021-05-20 MED ORDER — LIDOCAINE 5 % EX PTCH: 1.0000 | MEDICATED_PATCH | CUTANEOUS | 0 refills | Status: AC

## 2021-05-20 MED ORDER — HYDROCODONE-ACETAMINOPHEN 5-325 MG PO TABS
2.0000 | ORAL_TABLET | Freq: Once | ORAL | Status: AC
  Administered 2021-05-20: 2 via ORAL
  Filled 2021-05-20: qty 2

## 2021-05-20 MED ORDER — METHOCARBAMOL 500 MG PO TABS
1000.0000 mg | ORAL_TABLET | Freq: Once | ORAL | Status: AC
Start: 2021-05-20 — End: 2021-05-20
  Administered 2021-05-20: 1000 mg via ORAL
  Filled 2021-05-20: qty 2

## 2021-05-20 MED ORDER — LIDOCAINE 5 % EX PTCH
1.0000 | MEDICATED_PATCH | CUTANEOUS | Status: DC
  Administered 2021-05-20: 1 via TRANSDERMAL
  Filled 2021-05-20: qty 1

## 2021-05-20 MED ORDER — HYDROCODONE-ACETAMINOPHEN 5-325 MG PO TABS
1.0000 | ORAL_TABLET | Freq: Four times a day (QID) | ORAL | 0 refills | Status: AC | PRN
Start: 2021-05-20 — End: 2021-05-24

## 2021-05-20 MED ORDER — METHOCARBAMOL 500 MG PO TABS: 500.0000 mg | ORAL_TABLET | Freq: Two times a day (BID) | ORAL | 0 refills | Status: AC

## 2021-05-20 NOTE — ED Provider Notes (Signed)
[facility] EMERGENCY DEPARTMENT Provider Note   CSN: [id number] Arrival date & time: 05/20/21  0736     History  Chief Complaint  Patient presents with   Back Pain    RUXIN DASILVA is a 51 y.o. male.  HPI Patient presents for back pain.  This is following a fall that occurred 2 days ago.  Fall is described as follows: Patient was standing on top of a 4 step porch.  He fell backwards, landing on the right lower portion of his back.  Surface of ground that he landed on was muddy.  He was able to get back to his feet and ambulate.  He has since had pain and soreness in the area of his lower right back and proximal posterior right leg.  Pain is worsened with rotational movements.  He has been able to bear weight on his right leg and ambulate.  He denies any areas of numbness or weakness.  At home, he has taken gabapentin only for treatment of his pain.  He was prescribed gabapentin in the past for knee pain.  Patient denies any injury to any other areas of his body, including his head.    Home Medications Prior to Admission medications   Medication Sig Start Date End Date Taking? Authorizing Provider  atorvastatin (LIPITOR) 80 MG tablet TAKE 1 TABLET BY MOUTH DAILY AT 6 PM. 05/15/21 05/15/22 Yes Barbette Merino, NP  clopidogrel (PLAVIX) 75 MG tablet TAKE 1 TABLET (75 MG TOTAL) BY MOUTH DAILY. 04/14/21 04/14/22 Yes King, Shana Chute, NP  gabapentin (NEURONTIN) 600 MG tablet TAKE 2 TABLETS (1,200 MG TOTAL) BY MOUTH DAILY. 05/15/21 05/15/22 Yes Barbette Merino, NP  HYDROcodone-acetaminophen (NORCO/VICODIN) 5-325 MG tablet Take 1 tablet by mouth every 6 (six) hours as needed for up to 4 days for severe pain. 05/20/21 05/24/21 Yes Gloris Manchester, MD  lidocaine (LIDODERM) 5 % Place 1 patch onto the skin daily for 4 days. Remove & Discard patch within 12 hours or as directed by MD 05/20/21 05/24/21 Yes Gloris Manchester, MD  lisinopril-hydrochlorothiazide (ZESTORETIC) 10-12.5 MG tablet TAKE 1 TABLET BY MOUTH DAILY.  04/14/21 04/14/22 Yes King, Shana Chute, NP  metFORMIN (GLUCOPHAGE) 500 MG tablet TAKE 1 TABLET (500 MG TOTAL) BY MOUTH 2 (TWO) TIMES DAILY WITH A MEAL. 04/14/21 04/14/22 Yes King, Shana Chute, NP  methocarbamol (ROBAXIN) 500 MG tablet Take 1 tablet (500 mg total) by mouth 2 (two) times daily for 4 days. 05/20/21 05/24/21 Yes Gloris Manchester, MD  metoprolol succinate (TOPROL-XL) 25 MG 24 hr tablet TAKE 1 TABLET (25 MG TOTAL) BY MOUTH DAILY. 04/14/21 04/14/22 Yes Barbette Merino, NP      Allergies    Patient has no known allergies.    Review of Systems   Review of Systems  Musculoskeletal:  Positive for back pain.  All other systems reviewed and are negative.  Physical Exam Updated Vital Signs BP 135/85 (BP Location: Left Arm)    Pulse 70    Temp 98.2 F (36.8 C) (Oral)    Resp 18    Ht 6' (1.829 m)    Wt 104.3 kg    SpO2 95%    BMI 31.19 kg/m  Physical Exam Vitals and nursing note reviewed.  Constitutional:      General: He is not in acute distress.    Appearance: Normal appearance. He is well-developed and normal weight. He is not ill-appearing, toxic-appearing or diaphoretic.  HENT:     Head: Normocephalic and atraumatic.  Right Ear: External ear normal.     Left Ear: External ear normal.     Nose: Nose normal. No congestion.     Mouth/Throat:     Mouth: Mucous membranes are moist.     Pharynx: Oropharynx is clear.  Eyes:     General: No scleral icterus.    Extraocular Movements: Extraocular movements intact.     Conjunctiva/sclera: Conjunctivae normal.  Cardiovascular:     Rate and Rhythm: Normal rate and regular rhythm.     Heart sounds: No murmur heard. Pulmonary:     Effort: Pulmonary effort is normal. No respiratory distress.  Chest:     Chest wall: No tenderness.  Abdominal:     Palpations: Abdomen is soft.     Tenderness: There is no abdominal tenderness.  Musculoskeletal:        General: Tenderness (Right lower back) present. No swelling.     Cervical back: Normal  range of motion and neck supple. No rigidity.     Right lower leg: No edema.     Left lower leg: No edema.  Skin:    General: Skin is warm and dry.     Capillary Refill: Capillary refill takes less than 2 seconds.     Coloration: Skin is not jaundiced or pale.     Findings: No bruising.  Neurological:     General: No focal deficit present.     Mental Status: He is alert and oriented to person, place, and time.     Cranial Nerves: No cranial nerve deficit.     Sensory: No sensory deficit.     Motor: No weakness.     Coordination: Coordination normal.  Psychiatric:        Mood and Affect: Mood normal.        Behavior: Behavior normal.        Thought Content: Thought content normal.        Judgment: Judgment normal.    ED Results / Procedures / Treatments   Labs (all labs ordered are listed, but only abnormal results are displayed) Labs Reviewed  URINALYSIS, ROUTINE W REFLEX MICROSCOPIC    EKG None  Radiology No results found.  Procedures Procedures    Medications Ordered in ED Medications  lidocaine (LIDODERM) 5 % 1 patch (1 patch Transdermal Patch Applied 05/20/21 0911)  methocarbamol (ROBAXIN) tablet 1,000 mg (1,000 mg Oral Given 05/20/21 0908)  ketorolac (TORADOL) injection 60 mg (60 mg Intramuscular Given 05/20/21 0910)  HYDROcodone-acetaminophen (NORCO/VICODIN) 5-325 MG per tablet 2 tablet (2 tablets Oral Given 05/20/21 0909)    ED Course/ Medical Decision Making/ A&P                           Medical Decision Making Amount and/or Complexity of Data Reviewed Labs: ordered.  Risk Prescription drug management.   Patient is a healthy 51 year old male who presents for back pain that has been persistent for the past 2 days.  Differential diagnosis includes spinal injury, blunt injury to kidney, retroperitoneal hemorrhage, soft tissue injury.  His pain occurred following a fall, during which she fell backwards down approximately 4 steps landing on the right lower aspect  of his back.  Pain is worsened with bending and rotational movements.  He has been able to ambulate.  He has not had any areas of numbness, weakness, or paresthesias.  On arrival in the ED, his vital signs are normal.  He is well-appearing.  He has tenderness  to the entire quadrant of his right lower back.  He has no midline tenderness to his spine.  Range of motion of lower extremities is intact.  He does have pain with right hip flexion.  Patient was given multimodal pain control in the ED with significant relief of his symptoms.  Urinalysis showed no evidence of hematuria.  Following pain medication, he was ambulating without difficulty.  I do suspect that patient has soft tissue injury only based on mechanism and areas of pain.  Patient was given prescriptions for continued multimodal pain medication over the next several days.  He was advised that symptoms should improve and that he should be seen again if they do not.  He is stable for discharge at this time.        Final Clinical Impression(s) / ED Diagnoses Final diagnoses:  Acute right-sided low back pain without sciatica    Rx / DC Orders ED Discharge Orders          Ordered    lidocaine (LIDODERM) 5 %  Every 24 hours        05/20/21 1015    methocarbamol (ROBAXIN) 500 MG tablet  2 times daily        05/20/21 1015    HYDROcodone-acetaminophen (NORCO/VICODIN) 5-325 MG tablet  Every 6 hours PRN        05/20/21 1015              Godfrey Pick, MD 05/20/21 1131

## 2021-05-20 NOTE — ED Notes (Signed)
Pt confirms that he will call someone to drive him home prior to med admin

## 2021-05-20 NOTE — ED Triage Notes (Signed)
Patient complaining of fall 2 days prior with low back pain that moves down right leg.

## 2021-05-28 ENCOUNTER — Other Ambulatory Visit: Payer: Self-pay

## 2021-05-28 ENCOUNTER — Ambulatory Visit (INDEPENDENT_AMBULATORY_CARE_PROVIDER_SITE_OTHER): Payer: Self-pay | Admitting: Nurse Practitioner

## 2021-05-28 ENCOUNTER — Encounter: Payer: Self-pay | Admitting: Nurse Practitioner

## 2021-05-28 VITALS — BP 135/87 | HR 72 | Temp 98.0°F | Ht 72.0 in | Wt 238.0 lb

## 2021-05-28 DIAGNOSIS — R569 Unspecified convulsions: Secondary | ICD-10-CM

## 2021-05-28 DIAGNOSIS — M5441 Lumbago with sciatica, right side: Secondary | ICD-10-CM

## 2021-05-28 DIAGNOSIS — I739 Peripheral vascular disease, unspecified: Secondary | ICD-10-CM

## 2021-05-28 DIAGNOSIS — E118 Type 2 diabetes mellitus with unspecified complications: Secondary | ICD-10-CM

## 2021-05-28 DIAGNOSIS — G8929 Other chronic pain: Secondary | ICD-10-CM

## 2021-05-28 DIAGNOSIS — I10 Essential (primary) hypertension: Secondary | ICD-10-CM

## 2021-05-28 DIAGNOSIS — E785 Hyperlipidemia, unspecified: Secondary | ICD-10-CM

## 2021-05-28 DIAGNOSIS — M5442 Lumbago with sciatica, left side: Secondary | ICD-10-CM

## 2021-05-28 MED ORDER — METHOCARBAMOL 500 MG PO TABS
500.0000 mg | ORAL_TABLET | Freq: Two times a day (BID) | ORAL | 0 refills | Status: AC
Start: 2021-05-28 — End: 2021-08-26
  Filled 2021-05-28: qty 30, 15d supply, fill #0

## 2021-05-28 MED ORDER — KETOROLAC TROMETHAMINE 60 MG/2ML IM SOLN
60.0000 mg | Freq: Once | INTRAMUSCULAR | Status: AC
  Administered 2021-05-28: 60 mg via INTRAMUSCULAR

## 2021-05-28 NOTE — Progress Notes (Signed)
Aaron Kennedy, [place]  [address] Phone:  [phone]   Fax:  [phone]   Established Patient Office Visit  Subjective:  Patient ID: Aaron Kennedy, male    DOB: [birth year]  Age: 51 y.o. MRN: [id number]  CC:  Chief Complaint  Patient presents with   Follow-up    Pt is here for 3 month follow. Pt stated he fell and hurt his back pt was prescribed methocarbam he would like a refill they only gave him a qty 8     HPI Aaron Kennedy presents for follow up. He  has a past medical history of Anxiety, Chronic pain of right knee, Hypertension, Insomnia (01/2019), and Stroke ([place]).   Aaron Kennedy is in today for diabetes follow up. The prescribed treatment is  Metformin 500 mg BID along with statin therapy atorvastatin. The reported use of treatment is  consistent with prescribed. There does reported side effects from the treatment. Home glucose monitoring is not completed regularly. He has a goal to stop soda drinking.  Denies fever, chills, headache, dizziness, visual changes, polydipsia,  polyphagia shortness of breath, dyspnea on exertion, chest pain, abdominal pain, nausea, vomiting, polyuria, constipation, diarrhea,  any edema, numbness, tingling, burning of hand or feet. There has been a professional eye exam in the year.     Aaron Kennedy is in today for follow up for Hypertension. The current prescribed treatment is Zestoretic 10/12.5 mg . Compliance is reported and home blood pressure monitoring is not done. The  DASH diet is being followed. An exercise regimen not ongoing. Denies headache, dizziness, visual changes, shortness of breath, dyspnea on exertion, chest pain, nausea, vomiting or any edema.   He fell a few weeks ago. He was treated with Methocarbamol 500 mg and Norco. He is feels like the Norco was not effective. He continues have pain with radiating pain    Past Medical History:  Diagnosis Date   Anxiety    Chronic pain of  right knee    Hypertension    Insomnia 01/2019   Stroke [facility])     Past Surgical History:  Procedure Laterality Date   HERNIA REPAIR     INCISION AND DRAINAGE ABSCESS Left 07/21/2017   Procedure: INCISION AND DRAINAGE INGUINAL HERNIA ABSCESS;  Surgeon: Rolm Bookbinder, MD;  Location: [place];  Service: General;  Laterality: Left;   INGUINAL HERNIA REPAIR Bilateral 05/07/2017   Procedure: LAPAROSCOPIC RIGHT  INGUINAL HERNIA REPAIR AND  OPEN LEFT   INGUINAL EXPLORATION;  Surgeon: Ralene Ok, MD;  Location: [place];  Service: General;  Laterality: Bilateral;   INSERTION OF MESH Bilateral 05/07/2017   Procedure: INSERTION OF MESH;  Surgeon: Ralene Ok, MD;  Location: [place];  Service: General;  Laterality: Bilateral;   LEG SURGERY      Family History  Problem Relation Age of Onset   Stroke Brother        58s   Heart attack Sister        58s    Social History   Socioeconomic History   Marital status: Single    Spouse name: Not on file   Number of children: Not on file   Years of education: Not on file   Highest education level: Not on file  Occupational History   Not on file  Tobacco Use   Smoking status: Every Day    Packs/day: 0.50    Years: 20.00    Pack years: 10.00  Types: Cigarettes   Smokeless tobacco: Never  Vaping Use   Vaping Use: Never used  Substance and Sexual Activity   Alcohol use: Yes   Drug use: Yes    Types: Marijuana   Sexual activity: Not on file  Other Topics Concern   Not on file  Social History Narrative   Not on file   Social Determinants of Health   Financial Resource Strain: Not on file  Food Insecurity: Not on file  Transportation Needs: Not on file  Physical Activity: Not on file  Stress: Not on file  Social Connections: Not on file  Intimate Partner Violence: Not on file    Outpatient Medications Prior to Visit  Medication Sig Dispense Refill   atorvastatin (LIPITOR) 80 MG tablet TAKE 1 TABLET BY MOUTH DAILY AT 6 PM.  30 tablet 0   clopidogrel (PLAVIX) 75 MG tablet TAKE 1 TABLET (75 MG TOTAL) BY MOUTH DAILY. 30 tablet 11   gabapentin (NEURONTIN) 600 MG tablet TAKE 2 TABLETS (1,200 MG TOTAL) BY MOUTH DAILY. 60 tablet 0   lisinopril-hydrochlorothiazide (ZESTORETIC) 10-12.5 MG tablet TAKE 1 TABLET BY MOUTH DAILY. 30 tablet 11   metFORMIN (GLUCOPHAGE) 500 MG tablet TAKE 1 TABLET (500 MG TOTAL) BY MOUTH 2 (TWO) TIMES DAILY WITH A MEAL. 180 tablet 3   metoprolol succinate (TOPROL-XL) 25 MG 24 hr tablet TAKE 1 TABLET (25 MG TOTAL) BY MOUTH DAILY. 30 tablet 11   methocarbamol (ROBAXIN) 500 MG tablet Take 500 mg by mouth 2 (two) times daily.     No facility-administered medications prior to visit.    No Known Allergies  ROS Review of Systems    Objective:    Physical Exam Constitutional:      Appearance: Normal appearance.  HENT:     Head: Normocephalic and atraumatic.  Cardiovascular:     Rate and Rhythm: Normal rate and regular rhythm.     Pulses: Normal pulses.     Heart sounds: Normal heart sounds.  Pulmonary:     Effort: Pulmonary effort is normal.     Breath sounds: Normal breath sounds.  Abdominal:     Palpations: Abdomen is soft.  Musculoskeletal:        General: Tenderness present.     Cervical back: Normal range of motion.     Comments: Some tightness to left calf with walking   Skin:    General: Skin is warm and dry.     Capillary Refill: Capillary refill takes less than 2 seconds.  Neurological:     General: No focal deficit present.     Mental Status: He is alert and oriented to person, place, and time.  Psychiatric:        Mood and Affect: Mood normal.        Behavior: Behavior normal.        Thought Content: Thought content normal.        Judgment: Judgment normal.    BP 135/87    Pulse 72    Temp 98 F (36.7 C)    Ht 6' (1.829 m)    Wt 238 lb (108 kg)    SpO2 98%    BMI 32.28 kg/m  Wt Readings from Last 3 Encounters:  05/28/21 238 lb (108 kg)  05/20/21 230 lb (104.3  kg)  02/25/21 232 lb 0.8 oz (105.3 kg)     There are no preventive care reminders to display for this patient.   There are no preventive care reminders to display for  this patient.  Lab Results  Component Value Date   TSH 1.660 02/06/2019   Lab Results  Component Value Date   WBC 8.5 02/06/2019   HGB 16.7 02/06/2019   HCT 47.8 02/06/2019   MCV 100 (H) 02/06/2019   PLT 191 02/06/2019   Lab Results  Component Value Date   NA 139 02/25/2021   K 4.2 02/25/2021   CO2 22 02/06/2019   GLUCOSE 95 02/25/2021   BUN 11 02/25/2021   CREATININE 1.00 02/25/2021   BILITOT 0.4 02/25/2021   ALKPHOS 129 (H) 02/25/2021   AST 14 02/25/2021   ALT 36 02/06/2019   PROT 6.8 02/25/2021   ALBUMIN 4.5 02/25/2021   CALCIUM 9.2 02/25/2021   ANIONGAP 4 (L) 07/22/2017   EGFR 92 02/25/2021   Lab Results  Component Value Date   CHOL 240 (H) 02/25/2021   Lab Results  Component Value Date   HDL 27 (L) 02/25/2021   Lab Results  Component Value Date   LDLCALC 170 (H) 02/25/2021   Lab Results  Component Value Date   TRIG 229 (H) 02/25/2021   Lab Results  Component Value Date   CHOLHDL 8.9 (H) 02/25/2021   Lab Results  Component Value Date   HGBA1C 6.9 (A) 02/25/2021   HGBA1C 6.9 02/25/2021   HGBA1C 6.9 (A) 02/25/2021   HGBA1C 6.9 02/25/2021      Assessment & Plan:   Problem List Items Addressed This Visit       Cardiovascular and Mediastinum   HTN (hypertension) - Primary Stable  Encouraged on going compliance with current medication regimen Encouraged home monitoring and recording BP <130/80 Eating a heart-healthy diet with less salt Encouraged regular physical activity  Recommend Weight loss   PAD (peripheral artery disease) (HCC) Stable     Other   HLD (hyperlipidemia)   Seizures (HCC) No activity    Other Visit Diagnoses     DM type 2, controlled, with complication (HCC)   (Chronic)   Encourage compliance with current treatment regimen   Encourage regular  CBG monitoring Encourage contacting office if excessive hyperglycemia and or hypoglycemia Lifestyle modification with healthy diet (fewer calories, more high fiber foods, whole grains and non-starchy vegetables, lower fat meat and fish, low-fat diary include healthy oils) regular exercise (physical activity) and weight loss Home BP monitoring also encouraged goal <130/80    Chronic bilateral low back pain with bilateral sciatica     Persistent   Relevant Medications   methocarbamol (ROBAXIN) 500 MG tablet   ketorolac (TORADOL) injection 60 mg (Completed)       Meds ordered this encounter  Medications   methocarbamol (ROBAXIN) 500 MG tablet    Sig: Take 1 tablet (500 mg total) by mouth 2 (two) times daily.    Dispense:  30 tablet    Refill:  0    Order Specific Question:   Supervising Provider    Answer:   Tresa Garter [id number]   ketorolac (TORADOL) injection 60 mg    Follow-up: Return in about 3 months (around 08/25/2021).    Vevelyn Francois, NP

## 2021-05-28 NOTE — Patient Instructions (Signed)
Preventing High Cholesterol ?Cholesterol is a white, waxy substance similar to fat that the human body needs to help build cells. The liver makes all the cholesterol that a person's body needs. Having high cholesterol (hypercholesterolemia) increases your risk for heart disease and stroke. Extra or excess cholesterol comes from the food that you eat. ?High cholesterol can often be prevented with diet and lifestyle changes. If you already have high cholesterol, you can control it with diet, lifestyle changes, and medicines. ?How can high cholesterol affect me? ?If you have high cholesterol, fatty deposits (plaques) may build up on the walls of your blood vessels. The blood vessels that carry blood away from your heart are called arteries. Plaques make the arteries narrower and stiffer. This in turn can: ?Restrict or block blood flow and cause blood clots to form. ?Increase your risk for heart attack and stroke. ?What can increase my risk for high cholesterol? ?This condition is more likely to develop in people who: ?Eat foods that are high in saturated fat or cholesterol. Saturated fat is mostly found in foods that come from animal sources. ?Are overweight. ?Are not getting enough exercise. ?Use products that contain nicotine or tobacco, such as cigarettes, e-cigarettes, and chewing tobacco. ?Have a family history of high cholesterol (familial hypercholesterolemia). ?What actions can I take to prevent this? ?Nutrition ? ?Eat less saturated fat. ?Avoid trans fats (partially hydrogenated oils). These are often found in margarine and in some baked goods, fried foods, and snacks bought in packages. ?Avoid precooked or cured meat, such as bacon, sausages, or meat loaves. ?Avoid foods and drinks that have added sugars. ?Eat more fruits, vegetables, and whole grains. ?Choose healthy sources of protein, such as fish, poultry, lean cuts of red meat, beans, peas, lentils, and nuts. ?Choose healthy sources of fat, such  as: ?Nuts. ?Vegetable oils, especially olive oil. ?Fish that have healthy fats, such as omega-3 fatty acids. These fish include mackerel or salmon. ?Lifestyle ?Lose weight if you are overweight. Maintaining a healthy body mass index (BMI) can help prevent or control high cholesterol. It can also lower your risk for diabetes and high blood pressure. Ask your health care provider to help you with a diet and exercise plan to lose weight safely. ?Do not use any products that contain nicotine or tobacco. These products include cigarettes, chewing tobacco, and vaping devices, such as e-cigarettes. If you need help quitting, ask your health care provider. ?Alcohol use ?Do not drink alcohol if: ?Your health care provider tells you not to drink. ?You are pregnant, may be pregnant, or are planning to become pregnant. ?If you drink alcohol: ?Limit how much you have to: ?0-1 drink a day for women. ?0-2 drinks a day for men. ?Know how much alcohol is in your drink. In the [place] one drink equals one 12 oz bottle of beer (355 mL), one 5 oz glass of wine (148 mL), or one 1? oz glass of hard liquor (44 mL). ?Activity ? ?Get enough exercise. Do exercises as told by your health care provider. ?Each week, do at least 150 minutes of exercise that takes a medium level of effort (moderate-intensity exercise). This kind of exercise: ?Makes your heart beat faster while allowing you to still be able to talk. ?Can be done in short sessions several times a day or longer sessions a few times a week. For example, on 5 days each week, you could walk fast or ride your bike 3 times a day for 10 minutes each time. ?Medicines ?Your   health care provider may recommend medicines to help lower cholesterol. This may be a medicine to lower the amount of cholesterol that your liver makes. You may need medicine if: ?Diet and lifestyle changes have not lowered your cholesterol enough. ?You have high cholesterol and other risk factors for heart disease or  stroke. ?Take over-the-counter and prescription medicines only as told by your health care provider. ?General information ?Manage your risk factors for high cholesterol. Talk with your health care provider about all your risk factors and how to lower your risk. ?Manage other conditions that you have, such as diabetes or high blood pressure (hypertension). ?Have blood tests to check your cholesterol levels at regular points in time as told by your health care provider. ?Keep all follow-up visits. This is important. ?Where to find more information ?American Heart Association: www.heart.org ?National Heart, Lung, and Blood Institute: www.nhlbi.nih.gov ?Summary ?High cholesterol increases your risk for heart disease and stroke. By keeping your cholesterol level low, you can reduce your risk for these conditions. ?High cholesterol can often be prevented with diet and lifestyle changes. ?Work with your health care provider to manage your risk factors, and have your blood tested regularly. ?This information is not intended to replace advice given to you by your health care provider. Make sure you discuss any questions you have with your health care provider. ?Document Revised: 06/09/2020 Document Reviewed: 06/09/2020 ?Elsevier Patient Education ? 2022 [employer] Inc. ? ?

## 2021-05-29 ENCOUNTER — Other Ambulatory Visit: Payer: Self-pay

## 2021-06-18 ENCOUNTER — Other Ambulatory Visit: Payer: Self-pay

## 2021-06-19 ENCOUNTER — Other Ambulatory Visit: Payer: Self-pay

## 2021-06-30 ENCOUNTER — Emergency Department (HOSPITAL_COMMUNITY)
Admission: EM | Admit: 2021-06-30 | Discharge: 2021-07-01 | Disposition: A | Discharge status: Home / Self Care | Payer: Self-pay | Attending: Emergency Medicine | Admitting: Emergency Medicine

## 2021-06-30 ENCOUNTER — Emergency Department (HOSPITAL_COMMUNITY): Payer: Self-pay

## 2021-06-30 ENCOUNTER — Other Ambulatory Visit: Payer: Self-pay

## 2021-06-30 ENCOUNTER — Encounter (HOSPITAL_COMMUNITY): Payer: Self-pay

## 2021-06-30 DIAGNOSIS — Z7902 Long term (current) use of antithrombotics/antiplatelets: Secondary | ICD-10-CM | POA: Insufficient documentation

## 2021-06-30 DIAGNOSIS — Y9301 Activity, walking, marching and hiking: Secondary | ICD-10-CM | POA: Insufficient documentation

## 2021-06-30 DIAGNOSIS — S93411A Sprain of calcaneofibular ligament of right ankle, initial encounter: Secondary | ICD-10-CM

## 2021-06-30 DIAGNOSIS — W1842XA Slipping, tripping and stumbling without falling due to stepping into hole or opening, initial encounter: Secondary | ICD-10-CM | POA: Insufficient documentation

## 2021-06-30 NOTE — ED Triage Notes (Signed)
Pt reports "rolling ankle over while walking" today, now having right ankle pain. ?

## 2021-07-01 MED ORDER — IBUPROFEN 800 MG PO TABS
800.0000 mg | ORAL_TABLET | Freq: Once | ORAL | Status: DC
  Filled 2021-07-01: qty 1

## 2021-07-01 MED ORDER — IBUPROFEN 800 MG PO TABS: 800.0000 mg | ORAL_TABLET | Freq: Four times a day (QID) | ORAL | 0 refills | Status: DC | PRN

## 2021-07-01 MED ORDER — ACETAMINOPHEN 500 MG PO TABS
1000.0000 mg | ORAL_TABLET | Freq: Once | ORAL | Status: AC
  Administered 2021-07-01: 1000 mg via ORAL
  Filled 2021-07-01: qty 2

## 2021-07-01 NOTE — ED Notes (Signed)
Pt did not take paper prescription for ibuprofen. Takes plavix daily, contraindicated. 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 Reviewed d/c instructions, pt verbalized understanding. To lobby by wheelchair. Ambulatory with crutches to vehicle. No further questions.  ?

## 2021-07-01 NOTE — ED Provider Notes (Signed)
?[facility] ?Provider Note ? ? ?CSN: [id number] ?Arrival date & time: 06/30/21  2108 ? ?  ? ?History ? ?Chief Complaint  ?Patient presents with  ? Ankle Pain  ?  Right ankle  ? ? ?SHOTA DUREL is a 51 y.o. male. ? ?Patient presents to the emergency department for evaluation of right ankle pain.  Patient reports that he stepped in a hole and rolled his ankle, has been having diffuse ankle pain since. ? ? ?  ? ?Home Medications ?Prior to Admission medications   ?Medication Sig Start Date End Date Taking? Authorizing Provider  ?ibuprofen (ADVIL) 800 MG tablet Take 1 tablet (800 mg total) by mouth every 6 (six) hours as needed for moderate pain. 07/01/21  Yes Laneka Mcgrory, Gwenyth Allegra, MD  ?atorvastatin (LIPITOR) 80 MG tablet TAKE 1 TABLET BY MOUTH DAILY AT 6 PM. 05/15/21 05/15/22  Vevelyn Francois, NP  ?clopidogrel (PLAVIX) 75 MG tablet TAKE 1 TABLET (75 MG TOTAL) BY MOUTH DAILY. 04/14/21 04/14/22  Vevelyn Francois, NP  ?gabapentin (NEURONTIN) 600 MG tablet TAKE 2 TABLETS (1,200 MG TOTAL) BY MOUTH DAILY. 05/15/21 05/15/22  Vevelyn Francois, NP  ?lisinopril-hydrochlorothiazide (ZESTORETIC) 10-12.5 MG tablet TAKE 1 TABLET BY MOUTH DAILY. 04/14/21 04/14/22  Vevelyn Francois, NP  ?metFORMIN (GLUCOPHAGE) 500 MG tablet TAKE 1 TABLET (500 MG TOTAL) BY MOUTH 2 (TWO) TIMES DAILY WITH A MEAL. 04/14/21 04/14/22  Vevelyn Francois, NP  ?methocarbamol (ROBAXIN) 500 MG tablet Take 1 tablet (500 mg total) by mouth 2 (two) times daily. 05/28/21 08/26/21  Vevelyn Francois, NP  ?metoprolol succinate (TOPROL-XL) 25 MG 24 hr tablet TAKE 1 TABLET (25 MG TOTAL) BY MOUTH DAILY. 04/14/21 04/14/22  Vevelyn Francois, NP  ?   ? ?Allergies    ?Patient has no known allergies.   ? ?Review of Systems   ?Review of Systems  ?Musculoskeletal:  Positive for arthralgias and joint swelling.  ? ?Physical Exam ?Updated Vital Signs ?BP 124/83 (BP Location: Left Arm)   Pulse 81   Temp 98.1 ?F (36.7 ?C) (Oral)   Resp 18   Ht 6' (1.829 m)   Wt 104.8  kg   SpO2 94%   BMI 31.33 kg/m?  ?Physical Exam ?Vitals and nursing note reviewed.  ?HENT:  ?   Head: Atraumatic.  ?Cardiovascular:  ?   Pulses:     ?     Dorsalis pedis pulses are 2+ on the right side.  ?Musculoskeletal:  ?   Right knee: Normal.  ?   Right lower leg: Normal.  ?   Right ankle: Swelling present. No deformity or lacerations. Tenderness present over the CF ligament. No base of 5th metatarsal or proximal fibula tenderness. Decreased range of motion.  ?   Right Achilles Tendon: Normal.  ?Skin: ?   Findings: No bruising.  ?Neurological:  ?   Sensory: Sensation is intact.  ?   Motor: Motor function is intact.  ? ? ?ED Results / Procedures / Treatments   ?Labs ?(all labs ordered are listed, but only abnormal results are displayed) ?Labs Reviewed - No data to display ? ?EKG ?None ? ?Radiology ?DG Ankle Complete Right ? ?Result Date: 06/30/2021 ?CLINICAL DATA:  Fall. EXAM: RIGHT ANKLE - COMPLETE 3+ VIEW COMPARISON:  Right ankle x-ray 03/18/2015. FINDINGS: There is no evidence for acute fracture or dislocation. There are well corticated densities adjacent to the tip of the medial malleolus with osteophyte formation likely related to old injury. Joint spaces are maintained.  There is soft tissue swelling surrounding the ankle. There are degenerative changes of the dorsal tarsometatarsal joints. IMPRESSION: 1. Soft tissue swelling of the ankle. 2. No acute fracture or dislocation. Electronically Signed   By: Ronney Asters M.D.   On: 06/30/2021 22:22   ? ?Procedures ?Procedures  ? ? ?Medications Ordered in ED ?Medications  ?ibuprofen (ADVIL) tablet 800 mg (has no administration in time range)  ?acetaminophen (TYLENOL) tablet 1,000 mg (has no administration in time range)  ? ? ?ED Course/ Medical Decision Making/ A&P ?  ?                        ?Medical Decision Making ?Amount and/or Complexity of Data Reviewed ?Radiology: ordered. ? ?Risk ?OTC drugs. ?Prescription drug management. ? ? ?Presents with ankle injury.   Ankle fracture, ankle sprain considered.  X-ray shows no evidence of fracture. ? ? ? ? ? ? ? ?Final Clinical Impression(s) / ED Diagnoses ?Final diagnoses:  ?Sprain of calcaneofibular ligament of right ankle, initial encounter  ? ? ?Rx / DC Orders ?ED Discharge Orders   ? ?      Ordered  ?  ibuprofen (ADVIL) 800 MG tablet  Every 6 hours PRN       ? 07/01/21 0013  ? ?  ?  ? ?  ? ? ?  ?Orpah Greek, MD ?07/01/21 0014 ? ?

## 2021-07-17 ENCOUNTER — Other Ambulatory Visit: Payer: Self-pay

## 2021-07-20 ENCOUNTER — Other Ambulatory Visit: Payer: Self-pay

## 2021-08-21 ENCOUNTER — Other Ambulatory Visit: Payer: Self-pay

## 2021-08-24 ENCOUNTER — Other Ambulatory Visit: Payer: Self-pay

## 2021-08-25 ENCOUNTER — Other Ambulatory Visit: Payer: Self-pay

## 2021-08-26 ENCOUNTER — Ambulatory Visit: Payer: Self-pay | Admitting: Nurse Practitioner

## 2021-08-26 ENCOUNTER — Ambulatory Visit (INDEPENDENT_AMBULATORY_CARE_PROVIDER_SITE_OTHER): Payer: Self-pay | Admitting: Nurse Practitioner

## 2021-08-26 ENCOUNTER — Encounter: Payer: Self-pay | Admitting: Nurse Practitioner

## 2021-08-26 VITALS — BP 135/93 | HR 64 | Temp 98.2°F | Ht 72.0 in | Wt 230.0 lb

## 2021-08-26 DIAGNOSIS — E118 Type 2 diabetes mellitus with unspecified complications: Secondary | ICD-10-CM

## 2021-08-26 DIAGNOSIS — M25512 Pain in left shoulder: Secondary | ICD-10-CM

## 2021-08-26 DIAGNOSIS — I10 Essential (primary) hypertension: Secondary | ICD-10-CM

## 2021-08-26 DIAGNOSIS — G8929 Other chronic pain: Secondary | ICD-10-CM

## 2021-08-26 HISTORY — DX: Type 2 diabetes mellitus with unspecified complications: E11.8

## 2021-08-26 LAB — POCT GLYCOSYLATED HEMOGLOBIN (HGB A1C)
HbA1c POC (<> result, manual entry): 7.1 % (ref 4.0–5.6)
HbA1c, POC (controlled diabetic range): 7.1 % — AB (ref 0.0–7.0)
HbA1c, POC (prediabetic range): 7.1 % — AB (ref 5.7–6.4)
Hemoglobin A1C: 7.1 % — AB (ref 4.0–5.6)

## 2021-08-26 MED ORDER — KETOROLAC TROMETHAMINE 30 MG/ML IJ SOLN
30.0000 mg | Freq: Once | INTRAMUSCULAR | Status: AC
  Administered 2021-08-26: 30 mg via INTRAMUSCULAR
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
## 2021-08-26 NOTE — Patient Instructions (Addendum)
1. DM type 2, controlled, with complication (HCC) ? ?- HgB A1c ?- CBC ?- Comprehensive metabolic panel ? ? ? ?Lab Results  ?Component Value Date  ? HGBA1C 6.9 (A) 02/25/2021  ? HGBA1C 6.9 02/25/2021  ? HGBA1C 6.9 (A) 02/25/2021  ? HGBA1C 6.9 02/25/2021  ? ? ? ? ?2. Chronic left shoulder pain ? ?- ketorolac (TORADOL) 30 MG/ML injection 30 mg ? ? ? ?3. Hypertension, unspecified type ? ?-continue Zestoretic ? ?-low salt diet ? ? ? ?Follow up: ? ?Follow up in 3 months or sooner if needed  ? ? ? ?

## 2021-08-26 NOTE — Assessment & Plan Note (Signed)
-   HgB A1c ?- CBC ?- Comprehensive metabolic panel ? ? ? ?Lab Results  ?Component Value Date  ? HGBA1C 6.9 (A) 02/25/2021  ? HGBA1C 6.9 02/25/2021  ? HGBA1C 6.9 (A) 02/25/2021  ? HGBA1C 6.9 02/25/2021  ? ? ? ? ?2. Chronic left shoulder pain ? ?- ketorolac (TORADOL) 30 MG/ML injection 30 mg ? ? ? ?3. Hypertension, unspecified type ? ?-continue Zestoretic ? ?-low salt diet ? ? ? ?Follow up: ? ?Follow up in 3 months or sooner if needed  ?

## 2021-08-27 LAB — COMPREHENSIVE METABOLIC PANEL
ALT: 22 IU/L (ref 0–44)
AST: 10 IU/L (ref 0–40)
Albumin/Globulin Ratio: 1.8 (ref 1.2–2.2)
Albumin: 4.3 g/dL (ref 3.8–4.9)
Alkaline Phosphatase: 124 IU/L — ABNORMAL HIGH (ref 44–121)
BUN/Creatinine Ratio: 10 (ref 9–20)
BUN: 10 mg/dL (ref 6–24)
Bilirubin Total: 0.3 mg/dL (ref 0.0–1.2)
CO2: 22 mmol/L (ref 20–29)
Calcium: 9.3 mg/dL (ref 8.7–10.2)
Chloride: 101 mmol/L (ref 96–106)
Creatinine, Ser: 1.03 mg/dL (ref 0.76–1.27)
Globulin, Total: 2.4 g/dL (ref 1.5–4.5)
Glucose: 155 mg/dL — ABNORMAL HIGH (ref 70–99)
Potassium: 4.7 mmol/L (ref 3.5–5.2)
Sodium: 138 mmol/L (ref 134–144)
Total Protein: 6.7 g/dL (ref 6.0–8.5)
eGFR: 88 mL/min/{1.73_m2} (ref >59)

## 2021-08-27 LAB — CBC
Hematocrit: 48.4 % (ref 37.5–51.0)
Hemoglobin: 16.7 g/dL (ref 13.0–17.7)
MCH: 33.5 pg — ABNORMAL HIGH (ref 26.6–33.0)
MCHC: 34.5 g/dL (ref 31.5–35.7)
MCV: 97 fL (ref 79–97)
Platelets: 192 10*3/uL (ref 150–450)
RBC: 4.98 x10E6/uL (ref 4.14–5.80)
RDW: 12.9 % (ref 11.6–15.4)
WBC: 9.5 10*3/uL (ref 3.4–10.8)

## 2021-09-25 ENCOUNTER — Other Ambulatory Visit: Payer: Self-pay

## 2021-09-25 ENCOUNTER — Telehealth: Payer: Self-pay

## 2021-09-25 ENCOUNTER — Other Ambulatory Visit: Payer: Self-pay | Admitting: Nurse Practitioner

## 2021-09-25 DIAGNOSIS — G629 Polyneuropathy, unspecified: Secondary | ICD-10-CM

## 2021-09-25 DIAGNOSIS — E785 Hyperlipidemia, unspecified: Secondary | ICD-10-CM

## 2021-09-25 DIAGNOSIS — E118 Type 2 diabetes mellitus with unspecified complications: Secondary | ICD-10-CM

## 2021-09-25 DIAGNOSIS — I10 Essential (primary) hypertension: Secondary | ICD-10-CM

## 2021-09-25 MED ORDER — METFORMIN HCL 500 MG PO TABS
ORAL_TABLET | Freq: Two times a day (BID) | ORAL | 3 refills | Status: DC
  Filled 2021-11-30: qty 60, 30d supply, fill #0
  Filled 2022-02-08 (×2): qty 60, 30d supply, fill #1
  Filled 2022-03-17: qty 60, 30d supply, fill #2
  Filled 2022-05-18: qty 60, 30d supply, fill #3
  Filled 2022-07-20: qty 60, 30d supply, fill #4
  Filled 2022-08-17: qty 60, 30d supply, fill #5

## 2021-09-25 MED ORDER — LISINOPRIL-HYDROCHLOROTHIAZIDE 10-12.5 MG PO TABS
1.0000 | ORAL_TABLET | Freq: Every day | ORAL | 11 refills | Status: DC
Start: 2021-09-25 — End: 2022-10-22
  Filled 2021-10-27: qty 30, 30d supply, fill #0
  Filled 2021-11-30: qty 30, 30d supply, fill #1
  Filled 2022-01-07: qty 30, 30d supply, fill #2
  Filled 2022-02-08: qty 30, 30d supply, fill #3
  Filled 2022-03-17: qty 30, 30d supply, fill #4
  Filled 2022-04-08: qty 30, 30d supply, fill #5
  Filled 2022-05-18: qty 30, 30d supply, fill #6
  Filled 2022-06-18: qty 30, 30d supply, fill #7
  Filled 2022-07-20: qty 30, 30d supply, fill #8
  Filled 2022-08-17: qty 30, 30d supply, fill #9
  Filled 2022-09-20: qty 30, 30d supply, fill #10

## 2021-09-25 MED ORDER — ATORVASTATIN CALCIUM 80 MG PO TABS
ORAL_TABLET | ORAL | 0 refills | Status: DC
  Filled 2021-09-25: qty 30, 30d supply, fill #0

## 2021-09-25 MED ORDER — METOPROLOL SUCCINATE ER 25 MG PO TB24
ORAL_TABLET | Freq: Every day | ORAL | 11 refills | Status: DC
  Filled 2021-10-27: qty 30, 30d supply, fill #0
  Filled 2021-11-30: qty 30, 30d supply, fill #1
  Filled 2022-01-07: qty 30, 30d supply, fill #2
  Filled 2022-02-08: qty 30, 30d supply, fill #3
  Filled 2022-03-17: qty 30, 30d supply, fill #4
  Filled 2022-04-08: qty 30, 30d supply, fill #5
  Filled 2022-05-18: qty 30, 30d supply, fill #6
  Filled 2022-06-18: qty 30, 30d supply, fill #7
  Filled 2022-07-20: qty 30, 30d supply, fill #8
  Filled 2022-08-17: qty 30, 30d supply, fill #9
  Filled 2022-09-20: qty 30, 30d supply, fill #10

## 2021-09-25 MED ORDER — GABAPENTIN 600 MG PO TABS
ORAL_TABLET | ORAL | 0 refills | Status: DC
  Filled 2021-09-25: qty 60, 30d supply, fill #0

## 2021-09-25 NOTE — Telephone Encounter (Signed)
Patient requesting refills for Gabapentin, Lisinopril, metformin and metoprolol. Please send to [facility].

## 2021-09-28 ENCOUNTER — Other Ambulatory Visit: Payer: Self-pay

## 2021-10-27 ENCOUNTER — Other Ambulatory Visit: Payer: Self-pay

## 2021-11-26 ENCOUNTER — Other Ambulatory Visit: Payer: Self-pay

## 2021-11-26 ENCOUNTER — Encounter: Payer: Self-pay | Admitting: Nurse Practitioner

## 2021-11-26 ENCOUNTER — Ambulatory Visit (INDEPENDENT_AMBULATORY_CARE_PROVIDER_SITE_OTHER): Payer: Self-pay | Admitting: Nurse Practitioner

## 2021-11-26 VITALS — BP 155/94 | HR 64 | Temp 97.9°F | Ht 72.0 in | Wt 230.5 lb

## 2021-11-26 DIAGNOSIS — G8929 Other chronic pain: Secondary | ICD-10-CM

## 2021-11-26 DIAGNOSIS — I6302 Cerebral infarction due to thrombosis of basilar artery: Secondary | ICD-10-CM

## 2021-11-26 DIAGNOSIS — E118 Type 2 diabetes mellitus with unspecified complications: Secondary | ICD-10-CM

## 2021-11-26 DIAGNOSIS — M25561 Pain in right knee: Secondary | ICD-10-CM

## 2021-11-26 DIAGNOSIS — E785 Hyperlipidemia, unspecified: Secondary | ICD-10-CM

## 2021-11-26 DIAGNOSIS — I10 Essential (primary) hypertension: Secondary | ICD-10-CM

## 2021-11-26 DIAGNOSIS — G629 Polyneuropathy, unspecified: Secondary | ICD-10-CM

## 2021-11-26 LAB — POCT GLYCOSYLATED HEMOGLOBIN (HGB A1C)
HbA1c POC (<> result, manual entry): 7 % (ref 4.0–5.6)
HbA1c, POC (controlled diabetic range): 7 % (ref 0.0–7.0)
HbA1c, POC (prediabetic range): 7 % — AB (ref 5.7–6.4)
Hemoglobin A1C: 7 % — AB (ref 4.0–5.6)

## 2021-11-26 MED ORDER — CLOPIDOGREL BISULFATE 75 MG PO TABS
75.0000 mg | ORAL_TABLET | Freq: Every day | ORAL | 2 refills | Status: DC
  Filled 2021-11-26: qty 30, 30d supply, fill #0
  Filled 2022-02-08: qty 30, 30d supply, fill #1
  Filled 2022-03-17: qty 30, 30d supply, fill #2

## 2021-11-26 MED ORDER — ATORVASTATIN CALCIUM 80 MG PO TABS
80.0000 mg | ORAL_TABLET | Freq: Every day | ORAL | 2 refills | Status: DC
  Filled 2021-11-26: qty 30, 30d supply, fill #0
  Filled 2022-03-17: qty 30, 30d supply, fill #1

## 2021-11-26 MED ORDER — GABAPENTIN 600 MG PO TABS
600.0000 mg | ORAL_TABLET | Freq: Two times a day (BID) | ORAL | 2 refills | Status: DC
  Filled 2021-11-26: qty 60, 30d supply, fill #0
  Filled 2022-01-07: qty 60, 30d supply, fill #1
  Filled 2022-03-17: qty 60, 30d supply, fill #2

## 2021-11-26 MED ORDER — KETOROLAC TROMETHAMINE 30 MG/ML IJ SOLN
30.0000 mg | Freq: Once | INTRAMUSCULAR | Status: AC
  Administered 2021-11-26: 30 mg via INTRAMUSCULAR

## 2021-11-26 NOTE — Patient Instructions (Signed)
1. DM type 2, controlled, with complication (HCC)  - POCT glycosylated hemoglobin (Hb A1C)  2. Hypertension, unspecified type  -low salt diet  3. Neuropathy  - gabapentin (NEURONTIN) 600 MG tablet; Take 1 tablet (600 mg total) by mouth 2 (two) times daily.  Dispense: 60 tablet; Refill: 2  4. Hyperlipidemia, unspecified hyperlipidemia type  - atorvastatin (LIPITOR) 80 MG tablet; Take 1 tablet (80 mg total) by mouth daily.  Dispense: 30 tablet; Refill: 2  5. Cerebrovascular accident (CVA) due to thrombosis of basilar artery (HCC)  - clopidogrel (PLAVIX) 75 MG tablet; Take 1 tablet (75 mg total) by mouth daily.  Dispense: 30 tablet; Refill: 2   Follow up:  Follow up in 3 months

## 2021-11-26 NOTE — Progress Notes (Signed)
@Patient  ID: , male    DOB: May 04, 1970, 51 y.o.   MRN: [id number]  Chief Complaint  Patient presents with   Diabetes    Pt is here for 3 month's DM follow up visit. Pt is requesting refill on gabapentin,atorvastatin,clopidogrel    Referring provider: [id number], NP   HPI  Aaron Kennedy presents for follow up. He  has a past medical history of Anxiety, Chronic pain of right knee, Hypertension, Insomnia (01/2019), and Stroke (2016).      Diabetes:   Patient presents today for follow-up on diabetes.  He states that he has only been taking his metformin once daily.  We discussed that his hemoglobin A1c is currently 7.0.  We discussed that he does need to take his medication twice daily.  He does need to follow a strict diabetic diet.  Patient states he has been trying to take atorvastatin more regular.  He states he forgets to take his medications in the afternoons.  We discussed that may be necessary, on his phone to remind him.  Patient is agreeable.     Hypertension:   Patient also presents today for follow-up on hypertension.  Patient states that he is compliant with Zestoretic.  He states he has been doing well with no new issues or concerns.  Blood pressure was stable in office today.     Right knee pain:   Patient is requesting Toradol injection for right knee pain. Toradol given in office today. Knee pain is chronic since CVA. Patient is doing home PT stretching exercises.     No Known Allergies  Immunization History  Administered Date(s) Administered   Influenza,inj,Quad PF,6+ Mos 12/29/2015, 02/06/2019, 02/25/2021   Pneumococcal Polysaccharide-23 09/26/2015   Tdap 12/29/2015    Past Medical History:  Diagnosis Date   Anxiety    Chronic pain of right knee    DM type 2, controlled, with complication (HCC) 08/26/2021   Hypertension    Insomnia 01/2019   Stroke (HCC)     Tobacco History: Social History   Tobacco Use  Smoking Status Every  Day   Packs/day: 0.50   Years: 20.00   Total pack years: 10.00   Types: Cigarettes  Smokeless Tobacco Never   Ready to quit: Not Answered Counseling given: Not Answered   Outpatient Encounter Medications as of 11/26/2021  Medication Sig   lisinopril-hydrochlorothiazide (ZESTORETIC) 10-12.5 MG tablet TAKE 1 TABLET BY MOUTH DAILY.   metFORMIN (GLUCOPHAGE) 500 MG tablet TAKE 1 TABLET (500 MG TOTAL) BY MOUTH 2 (TWO) TIMES DAILY WITH A MEAL.   metoprolol succinate (TOPROL-XL) 25 MG 24 hr tablet TAKE 1 TABLET (25 MG TOTAL) BY MOUTH DAILY.   [DISCONTINUED] atorvastatin (LIPITOR) 80 MG tablet TAKE 1 TABLET BY MOUTH DAILY AT 6 PM.   [DISCONTINUED] clopidogrel (PLAVIX) 75 MG tablet TAKE 1 TABLET (75 MG TOTAL) BY MOUTH DAILY.   [DISCONTINUED] gabapentin (NEURONTIN) 600 MG tablet TAKE 2 TABLETS (1,200 MG TOTAL) BY MOUTH DAILY.   atorvastatin (LIPITOR) 80 MG tablet Take 1 tablet (80 mg total) by mouth daily.   clopidogrel (PLAVIX) 75 MG tablet Take 1 tablet (75 mg total) by mouth daily.   gabapentin (NEURONTIN) 600 MG tablet Take 1 tablet (600 mg total) by mouth 2 (two) times daily.   [DISCONTINUED] ibuprofen (ADVIL) 800 MG tablet Take 1 tablet (800 mg total) by mouth every 6 (six) hours as needed for moderate pain.   [EXPIRED] ketorolac (TORADOL) 30 MG/ML injection 30 mg  No facility-administered encounter medications on file as of 11/26/2021.     Review of Systems  Review of Systems  Constitutional: Negative.   HENT: Negative.    Cardiovascular: Negative.   Gastrointestinal: Negative.   Musculoskeletal:        Right knee pain  Allergic/Immunologic: Negative.   Neurological: Negative.   Psychiatric/Behavioral: Negative.         Physical Exam  BP (!) 155/94 (BP Location: Left Arm, Patient Position: Sitting, Cuff Size: Large)   Pulse 64   Temp 97.9 F (36.6 C)   Ht 6' (1.829 m)   Wt 230 lb 8 oz (104.6 kg)   SpO2 96%   BMI 31.26 kg/m   Wt Readings from Last 5 Encounters:   11/26/21 230 lb 8 oz (104.6 kg)  08/26/21 230 lb (104.3 kg)  06/30/21 231 lb (104.8 kg)  05/28/21 238 lb (108 kg)  05/20/21 230 lb (104.3 kg)     Physical Exam Vitals and nursing note reviewed.  Constitutional:      General: He is not in acute distress.    Appearance: He is well-developed.  Cardiovascular:     Rate and Rhythm: Normal rate and regular rhythm.  Pulmonary:     Effort: Pulmonary effort is normal.     Breath sounds: Normal breath sounds.  Musculoskeletal:     Right knee: No swelling. Decreased range of motion. Tenderness present.  Skin:    General: Skin is warm and dry.  Neurological:     Mental Status: He is alert and oriented to person, place, and time.       Assessment & Plan:   DM type 2, controlled, with complication (HCC) - POCT glycosylated hemoglobin (Hb A1C)  2. Hypertension, unspecified type  -low salt diet  3. Neuropathy  - gabapentin (NEURONTIN) 600 MG tablet; Take 1 tablet (600 mg total) by mouth 2 (two) times daily.  Dispense: 60 tablet; Refill: 2  4. Hyperlipidemia, unspecified hyperlipidemia type  - atorvastatin (LIPITOR) 80 MG tablet; Take 1 tablet (80 mg total) by mouth daily.  Dispense: 30 tablet; Refill: 2  5. Cerebrovascular accident (CVA) due to thrombosis of basilar artery (HCC)  - clopidogrel (PLAVIX) 75 MG tablet; Take 1 tablet (75 mg total) by mouth daily.  Dispense: 30 tablet; Refill: 2   Follow up:  Follow up in 3 months     Aaron Andrew, NP 11/26/2021

## 2021-11-26 NOTE — Assessment & Plan Note (Signed)
-   POCT glycosylated hemoglobin (Hb A1C)  2. Hypertension, unspecified type  -low salt diet  3. Neuropathy  - gabapentin (NEURONTIN) 600 MG tablet; Take 1 tablet (600 mg total) by mouth 2 (two) times daily.  Dispense: 60 tablet; Refill: 2  4. Hyperlipidemia, unspecified hyperlipidemia type  - atorvastatin (LIPITOR) 80 MG tablet; Take 1 tablet (80 mg total) by mouth daily.  Dispense: 30 tablet; Refill: 2  5. Cerebrovascular accident (CVA) due to thrombosis of basilar artery (HCC)  - clopidogrel (PLAVIX) 75 MG tablet; Take 1 tablet (75 mg total) by mouth daily.  Dispense: 30 tablet; Refill: 2   Follow up:  Follow up in 3 months

## 2021-11-30 ENCOUNTER — Other Ambulatory Visit: Payer: Self-pay

## 2021-12-01 ENCOUNTER — Other Ambulatory Visit: Payer: Self-pay

## 2021-12-15 ENCOUNTER — Telehealth: Payer: Self-pay | Admitting: Nurse Practitioner
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
## 2021-12-17 ENCOUNTER — Telehealth: Payer: Self-pay

## 2021-12-17 NOTE — Telephone Encounter (Signed)
Pt is waiting on a Note for DSS.  He said he discussed it with Tisha and she sent the note to you!

## 2021-12-17 NOTE — Telephone Encounter (Signed)
We will need to set up an appointment for this. Thanks.

## 2021-12-18 ENCOUNTER — Telehealth: Payer: Self-pay | Admitting: Nurse Practitioner

## 2021-12-18 ENCOUNTER — Encounter: Payer: Self-pay | Admitting: Nurse Practitioner

## 2021-12-18 NOTE — Telephone Encounter (Signed)
Returned patient's call. He states that he needs a letter faxed to DSS stating that he had a stroke with right sided weakness limiting ability to work for [employer]. Will fax letter today. Angus Seller, FNP-C

## 2022-01-07 ENCOUNTER — Other Ambulatory Visit: Payer: Self-pay

## 2022-01-08 ENCOUNTER — Other Ambulatory Visit: Payer: Self-pay

## 2022-02-08 ENCOUNTER — Other Ambulatory Visit: Payer: Self-pay

## 2022-02-10 ENCOUNTER — Other Ambulatory Visit: Payer: Self-pay

## 2022-03-01 ENCOUNTER — Other Ambulatory Visit: Payer: Self-pay

## 2022-03-01 ENCOUNTER — Encounter: Payer: Self-pay | Admitting: Nurse Practitioner

## 2022-03-01 ENCOUNTER — Ambulatory Visit (INDEPENDENT_AMBULATORY_CARE_PROVIDER_SITE_OTHER): Payer: Self-pay | Admitting: Nurse Practitioner

## 2022-03-01 VITALS — BP 132/86 | HR 74 | Ht 72.0 in | Wt 232.0 lb

## 2022-03-01 DIAGNOSIS — M25561 Pain in right knee: Secondary | ICD-10-CM

## 2022-03-01 DIAGNOSIS — Z23 Encounter for immunization: Secondary | ICD-10-CM

## 2022-03-01 DIAGNOSIS — E118 Type 2 diabetes mellitus with unspecified complications: Secondary | ICD-10-CM

## 2022-03-01 DIAGNOSIS — G8929 Other chronic pain: Secondary | ICD-10-CM

## 2022-03-01 LAB — POCT GLYCOSYLATED HEMOGLOBIN (HGB A1C): Hemoglobin A1C: 7.3 % — AB (ref 4.0–5.6)

## 2022-03-01 MED ORDER — KETOROLAC TROMETHAMINE 30 MG/ML IJ SOLN
30.0000 mg | Freq: Once | INTRAMUSCULAR | Status: AC
  Administered 2022-03-01: 30 mg via INTRAMUSCULAR

## 2022-03-01 MED ORDER — KETOROLAC TROMETHAMINE 30 MG/ML IJ SOLN
30.0000 mg | Freq: Once | INTRAMUSCULAR | 0 refills | Status: DC
  Filled 2022-03-01: qty 1, 1d supply, fill #0

## 2022-03-01 NOTE — Progress Notes (Signed)
Subjective:    Patient ID: Aaron Kennedy, male    DOB: [birth year], 51 y.o.   MRN: [id number]  Aaron Kennedy is a 51 y.o. male who presents for follow-up of Type 2 diabetes mellitus.  Aaron Kennedy presents for follow up. He  has a past medical history of Anxiety, Chronic pain of right knee, Hypertension, Insomnia (01/2019), and Stroke (2016).      Hypertension:   Patient also presents today for follow-up on hypertension.  Patient states that he is compliant with Zestoretic.  He states he has been doing well with no new issues or concerns.  Blood pressure was stable in office today.     Diabetes:  Patient is not checking home blood sugars.   Home blood sugar records: patient does not check sugars How often is blood sugars being checked: N/A Current symptoms/problems include none and have been stable. Daily foot checks: yes   Any foot concerns: none Last eye exam: N/A Exercise:  staying active QD   Knee pain - chronic - would like toradol injection    The following portions of the patient's history were reviewed and updated as appropriate: allergies, current medications, past medical history, past social history and problem list.    Review of Systems  Constitutional: Negative.   HENT: Negative.    Eyes: Negative.   Respiratory: Negative.    Cardiovascular: Negative.   Gastrointestinal: Negative.   Genitourinary: Negative.   Musculoskeletal: Negative.   Skin: Negative.   Neurological: Negative.   Endo/Heme/Allergies: Negative.   Psychiatric/Behavioral: Negative.          Objective:    Physical Exam Constitutional:      General: He is not in acute distress. Cardiovascular:     Rate and Rhythm: Normal rate and regular rhythm.  Pulmonary:     Effort: Pulmonary effort is normal.     Breath sounds: Normal breath sounds.  Skin:    General: Skin is warm and dry.  Neurological:     Mental Status: He is alert and oriented to person, place, and time.   Psychiatric:        Mood and Affect: Affect normal.    Blood pressure 132/86, pulse 74, height 6' (1.829 m), weight 232 lb (105.2 kg), SpO2 97 %.  Lab Review    Latest Ref Rng & Units 11/26/2021    8:29 AM 08/26/2021    9:23 AM 08/26/2021    9:05 AM 02/25/2021   11:25 AM 02/25/2021   10:23 AM  Diabetic Labs  HbA1c 5.7 - 6.4 % 0.0 - 7.0 % 4.0 - 5.6 % 4.0 - 5.6 % 7.0    7.0    7.0    7.0   7.1    7.1    7.1    7.1   6.9    6.9    6.9    6.9   Chol 100 - 199 mg/dL    882    HDL >80 mg/dL    27    Calc LDL 0 - 99 mg/dL    034    Triglycerides 0 - 149 mg/dL    917    Creatinine 9.15 - 1.27 mg/dL  0.56   9.79        48/04/6551    8:14 AM 11/26/2021    8:19 AM 11/26/2021    8:06 AM 08/26/2021    8:34 AM 07/01/2021   12:42 AM  BP/Weight  Systolic BP 132 155 120 135 125  Diastolic BP 86 94 99 93 80  Wt. (Lbs) 232  230.5 230   BMI 31.46 kg/m2  31.26 kg/m2 31.19 kg/m2        No data to display          Aaron Kennedy  reports that he has been smoking cigarettes. He has a 10.00 pack-year smoking history. He has never used smokeless tobacco. He reports current alcohol use. He reports current drug use. Drug: Marijuana.     Assessment & Plan:    DM type 2, controlled, with complication (HCC) - Plan: POCT glycosylated hemoglobin (Hb A1C), Microalbumin/Creatinine Ratio, Urine  Chronic pain of right knee - Plan: ketorolac (TORADOL) 30 MG/ML injection  Rx changes: none Education: Reviewed 'ABCs' of diabetes management (respective goals in parentheses):  A1C (<7), blood pressure (<130/80), and cholesterol (LDL <100). Compliance at present is estimated to be good. Efforts to improve compliance (if necessary) will be directed at increased exercise. Follow up: 3 months  1. DM type 2, controlled, with complication (HCC)  - POCT glycosylated hemoglobin (Hb A1C) - Microalbumin/Creatinine Ratio, Urine  A1C today is 7.3 - no change in medications   2. Chronic pain of right knee  -  ketorolac (TORADOL) 30 MG/ML injection; Inject 1 mL (30 mg total) into the vein once for 1 dose.  Dispense: 1 mL; Refill: 0    Follow up:  Follow up in 3 months

## 2022-03-01 NOTE — Patient Instructions (Addendum)
1. DM type 2, controlled, with complication (HCC)  - POCT glycosylated hemoglobin (Hb A1C) - Microalbumin/Creatinine Ratio, Urine  A1C today is 7.3 - no change in medications   2. Chronic pain of right knee  - ketorolac (TORADOL) 30 MG/ML injection; Inject 1 mL (30 mg total) into the vein once for 1 dose.  Dispense: 1 mL; Refill: 0    Follow up:  Follow up in 3 months

## 2022-03-02 LAB — MICROALBUMIN / CREATININE URINE RATIO
Creatinine, Urine: 61.7 mg/dL
Microalb/Creat Ratio: <5 mg/g creat (ref 0–29)
Microalbumin, Urine: <3 ug/mL

## 2022-03-17 ENCOUNTER — Other Ambulatory Visit: Payer: Self-pay

## 2022-03-18 ENCOUNTER — Other Ambulatory Visit: Payer: Self-pay

## 2022-04-08 ENCOUNTER — Other Ambulatory Visit: Payer: Self-pay

## 2022-05-18 ENCOUNTER — Other Ambulatory Visit: Payer: Self-pay

## 2022-05-18 ENCOUNTER — Other Ambulatory Visit: Payer: Self-pay | Admitting: Nurse Practitioner

## 2022-05-18 DIAGNOSIS — G629 Polyneuropathy, unspecified: Secondary | ICD-10-CM

## 2022-05-18 DIAGNOSIS — I6302 Cerebral infarction due to thrombosis of basilar artery: Secondary | ICD-10-CM

## 2022-05-18 MED ORDER — CLOPIDOGREL BISULFATE 75 MG PO TABS
75.0000 mg | ORAL_TABLET | Freq: Every day | ORAL | 2 refills | Status: DC
  Filled 2022-05-18: qty 30, 30d supply, fill #0
  Filled 2022-06-18: qty 30, 30d supply, fill #1
  Filled 2022-08-17: qty 30, 30d supply, fill #2

## 2022-05-18 MED ORDER — GABAPENTIN 600 MG PO TABS
600.0000 mg | ORAL_TABLET | Freq: Two times a day (BID) | ORAL | 2 refills | Status: DC
  Filled 2022-05-18: qty 60, 30d supply, fill #0
  Filled 2022-07-20: qty 60, 30d supply, fill #1
  Filled 2022-09-20: qty 60, 30d supply, fill #2

## 2022-05-18 NOTE — Telephone Encounter (Signed)
Please advise due to [place] being out. [place]

## 2022-05-19 ENCOUNTER — Other Ambulatory Visit: Payer: Self-pay

## 2022-06-02 ENCOUNTER — Ambulatory Visit: Payer: Self-pay | Admitting: Nurse Practitioner

## 2022-06-02 DIAGNOSIS — H5213 Myopia, bilateral: Secondary | ICD-10-CM | POA: Diagnosis not present

## 2022-06-03 ENCOUNTER — Ambulatory Visit: Payer: Self-pay | Admitting: Nurse Practitioner

## 2022-06-09 ENCOUNTER — Other Ambulatory Visit: Payer: Self-pay

## 2022-06-09 ENCOUNTER — Ambulatory Visit
Admission: EM | Admit: 2022-06-09 | Discharge: 2022-06-09 | Disposition: A | Discharge status: Home / Self Care | Payer: Medicaid Other | Attending: Nurse Practitioner | Admitting: Nurse Practitioner

## 2022-06-09 ENCOUNTER — Encounter: Payer: Self-pay | Admitting: Emergency Medicine

## 2022-06-09 DIAGNOSIS — Z1152 Encounter for screening for COVID-19: Secondary | ICD-10-CM | POA: Diagnosis not present

## 2022-06-09 DIAGNOSIS — J069 Acute upper respiratory infection, unspecified: Secondary | ICD-10-CM | POA: Insufficient documentation

## 2022-06-09 LAB — POCT INFLUENZA A/B
Influenza A, POC: NEGATIVE
Influenza B, POC: NEGATIVE

## 2022-06-09 NOTE — ED Triage Notes (Signed)
Pt reports nasal congestion, runny nose, cough x4 days.

## 2022-06-09 NOTE — Discharge Instructions (Addendum)
The influenza test was negative.  A COVID test is pending.  You will be contacted if your pending test result is positive.  As discussed, you declined antiviral treatment if your COVID test is positive.  You may start over-the-counter Flonase and continue the cough and cold medicine you are currently taking at this time.  Increase fluids and allow for plenty of rest.  Recommend normal saline nasal spray throughout the day to help with nasal congestion or runny nose.  Recommend use of a humidifier in your bedroom at nighttime during sleep and sleeping elevated on pillows to help with cough symptoms.  Please be advised that a viral illness can last anywhere from 10 to 14 days.  If your symptoms suddenly worsen before that time, or extend beyond that time, please follow-up with your primary care physician for further evaluation.  Follow-up as needed.

## 2022-06-09 NOTE — ED Provider Notes (Signed)
[facility]    CSN: [id number] Arrival date & time: 06/09/22  [place]      History   Chief Complaint Chief Complaint  Patient presents with   Nasal Congestion    HPI Aaron DEBOY is a 52 y.o. male.   The history is provided by the patient.   The patient presents for complaints of nasal congestion, runny nose, and cough.  Symptoms have been present for the past 4 days.  Patient denies fever, chills, ear pain, sore throat, wheezing, shortness of breath, difficulty breathing, or GI symptoms.  He reports his girlfriend's son has been sick with a cold.  He reports he has been taking over-the-counter cough and cold medications and using cough drops for symptoms.  Past Medical History:  Diagnosis Date   Anxiety    Chronic pain of right knee    DM type 2, controlled, with complication ([place]) [id number]   Hypertension    Insomnia 01/2019   Stroke [facility])     Patient Active Problem List   Diagnosis Date Noted   DM type 2, controlled, with complication ([place]) 0000000   Insomnia 06/23/2019   Anxiety 06/23/2019   Soft tissue abscess of inguinal region 07/21/2017   Abscess 06/04/2017   Recurrent inguinal hernia 05/07/2017   S/P hernia repair 05/07/2017   Chronic pain of right knee 04/22/2016   PAD (peripheral artery disease) ([place]) 05/02/2015   Stroke (cerebrum) ([place]) 04/08/2015   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   CVA (cerebral vascular accident) ([place]) 04/03/2015   Seizures ([place]) 04/03/2015   Stroke ([place]) 03/22/2015   HTN (hypertension) 03/22/2015   HLD (hyperlipidemia) 03/22/2015   Prediabetes 03/22/2015   Tobacco use disorder 03/22/2015   [name] (transient ischemic attack) 03/18/2015   History of fracture of ankle    Right ankle pain     Past Surgical History:  Procedure Laterality Date   HERNIA REPAIR     INCISION AND DRAINAGE ABSCESS Left 07/21/2017   Procedure: INCISION AND DRAINAGE INGUINAL HERNIA ABSCESS;  Surgeon: Rolm Bookbinder, MD;  Location: [place];  Service: General;  Laterality: Left;   INGUINAL HERNIA REPAIR Bilateral 05/07/2017   Procedure: LAPAROSCOPIC RIGHT  INGUINAL HERNIA REPAIR AND  OPEN LEFT   INGUINAL EXPLORATION;  Surgeon: Ralene Ok, MD;  Location: [place];  Service: General;  Laterality: Bilateral;   INSERTION OF MESH Bilateral 05/07/2017   Procedure: INSERTION OF MESH;  Surgeon: Ralene Ok, MD;  Location: [place];  Service: General;  Laterality: Bilateral;   LEG SURGERY         Home Medications    Prior to Admission medications   Medication Sig Start Date End Date Taking? Authorizing Provider  atorvastatin (LIPITOR) 80 MG tablet Take 1 tablet (80 mg total) by mouth daily. 11/26/21 04/17/22  Fenton Foy, NP  clopidogrel (PLAVIX) 75 MG tablet Take 1 tablet (75 mg total) by mouth daily. 05/18/22 08/16/22  Fenton Foy, NP  gabapentin (NEURONTIN) 600 MG tablet Take 1 tablet (600 mg total) by mouth 2 (two) times daily. 05/18/22 08/16/22  Renee Rival, FNP  lisinopril-hydrochlorothiazide (ZESTORETIC) 10-12.5 MG tablet TAKE 1 TABLET BY MOUTH DAILY. 09/25/21 09/25/22  Fenton Foy, NP  metFORMIN (GLUCOPHAGE) 500 MG tablet TAKE 1 TABLET (500 MG TOTAL) BY MOUTH 2 (TWO) TIMES DAILY WITH A MEAL. 09/25/21 09/25/22  Fenton Foy, NP  metoprolol succinate (TOPROL-XL) 25 MG 24 hr tablet TAKE 1 TABLET (25 MG TOTAL) BY MOUTH DAILY. 09/25/21 09/25/22  Fenton Foy, NP    Family History Family History  Problem Relation Age of Onset   Stroke Brother        60s   Heart attack Sister        22s    Social History Social History   Tobacco Use   Smoking status: Every Day    Packs/day: 0.50    Years: 20.00    Total pack years: 10.00    Types: Cigarettes   Smokeless tobacco: Never  Vaping Use   Vaping Use: Never used  Substance Use Topics   Alcohol use: Yes   Drug use: Yes    Types: Marijuana     Allergies   Patient has no known allergies.   Review of Systems Review of  Systems Per HPI  Physical Exam Triage Vital Signs ED Triage Vitals  Enc Vitals Group     BP 06/09/22 1917 124/81     Pulse Rate 06/09/22 1917 70     Resp 06/09/22 1917 20     Temp 06/09/22 1917 98.8 F (37.1 C)     Temp Source 06/09/22 1917 Oral     SpO2 06/09/22 1917 92 %     Weight --      Height --      Head Circumference --      Peak Flow --      Pain Score 06/09/22 1915 0     Pain Loc --      Pain Edu? --      Excl. in [place]? --    No data found.  Updated Vital Signs BP 124/81 (BP Location: Right Arm)   Pulse 70   Temp 98.8 F (37.1 C) (Oral)   Resp 20   SpO2 92%   Visual Acuity Right Eye Distance:   Left Eye Distance:   Bilateral Distance:    Right Eye Near:   Left Eye Near:    Bilateral Near:     Physical Exam Vitals and nursing note reviewed.  Constitutional:      Appearance: Normal appearance. He is well-developed.  HENT:     Head: Normocephalic and atraumatic.     Right Ear: Tympanic membrane, ear canal and external ear normal.     Left Ear: Tympanic membrane, ear canal and external ear normal.     Nose: Congestion present. No rhinorrhea.     Right Turbinates: Enlarged and swollen.     Left Turbinates: Enlarged and swollen.     Right Sinus: No maxillary sinus tenderness or frontal sinus tenderness.     Left Sinus: No maxillary sinus tenderness or frontal sinus tenderness.     Mouth/Throat:     Lips: Pink.     Mouth: Mucous membranes are moist.     Pharynx: Uvula midline. Posterior oropharyngeal erythema present. No pharyngeal swelling, oropharyngeal exudate or uvula swelling.     Tonsils: No tonsillar exudate.  Eyes:     Conjunctiva/sclera: Conjunctivae normal.     Pupils: Pupils are equal, round, and reactive to light.  Neck:     Thyroid: No thyromegaly.     Trachea: No tracheal deviation.  Cardiovascular:     Rate and Rhythm: Normal rate and regular rhythm.     Pulses: Normal pulses.     Heart sounds: Normal heart sounds.  Pulmonary:      Effort: Pulmonary effort is normal.     Breath sounds: Normal breath sounds.  Abdominal:     General: Bowel sounds are normal.  There is no distension.     Palpations: Abdomen is soft.     Tenderness: There is no abdominal tenderness.  Musculoskeletal:     Cervical back: Normal range of motion and neck supple.  Skin:    General: Skin is warm and dry.  Neurological:     General: No focal deficit present.     Mental Status: He is alert and oriented to person, place, and time.  Psychiatric:        Mood and Affect: Mood normal.        Behavior: Behavior normal.        Thought Content: Thought content normal.        Judgment: Judgment normal.      UC Treatments / Results  Labs (all labs ordered are listed, but only abnormal results are displayed) Labs Reviewed  SARS CORONAVIRUS 2 (TAT 6-24 HRS)  POCT INFLUENZA A/B    EKG   Radiology No results found.  Procedures Procedures (including critical care time)  Medications Ordered in UC Medications - No data to display  Initial Impression / Assessment and Plan / UC Course  I have reviewed the triage vital signs and the nursing notes.  Pertinent labs & imaging results that were available during my care of the patient were reviewed by me and considered in my medical decision making (see chart for details). The patient is well-appearing, he is in no acute distress, vital signs are stable.  Influenza test was negative, COVID test is pending.  Suspect a viral upper respiratory infection with cough at this time.  Patient declines antiviral therapy if his COVID test is positive.  Patient will use over-the-counter medications such as Flonase, and cough and cold medications for his symptoms.  Patient was also advised to use a humidifier in his bedroom at nighttime during sleep, and sleep elevated on pillows while cough symptoms persist.  Discussed viral etiology with the patient and when follow-up may be indicated.  Patient is in agreement  with this plan of care and verbalizes understanding.  All questions were answered.  Patient stable for discharge.   Final Clinical Impressions(s) / UC Diagnoses   Final diagnoses:  Encounter for screening for COVID-19  Viral upper respiratory tract infection with cough     Discharge Instructions      The influenza test was negative.  A COVID test is pending.  You will be contacted if your pending test result is positive.  As discussed, you declined antiviral treatment if your COVID test is positive.  You may start over-the-counter Flonase and continue the cough and cold medicine you are currently taking at this time.  Increase fluids and allow for plenty of rest.  Recommend normal saline nasal spray throughout the day to help with nasal congestion or runny nose.  Recommend use of a humidifier in your bedroom at nighttime during sleep and sleeping elevated on pillows to help with cough symptoms.  Please be advised that a viral illness can last anywhere from 10 to 14 days.  If your symptoms suddenly worsen before that time, or extend beyond that time, please follow-up with your primary care physician for further evaluation.  Follow-up as needed.     ED Prescriptions   None    PDMP not reviewed this encounter.   Aaron Men, NP 06/09/22 1958

## 2022-06-10 ENCOUNTER — Other Ambulatory Visit: Payer: Self-pay

## 2022-06-10 ENCOUNTER — Ambulatory Visit: Payer: Medicaid Other | Admitting: Nurse Practitioner

## 2022-06-10 ENCOUNTER — Encounter: Payer: Self-pay | Admitting: Nurse Practitioner

## 2022-06-10 VITALS — BP 127/89 | HR 72 | Temp 97.7°F | Ht 71.0 in | Wt 231.2 lb

## 2022-06-10 DIAGNOSIS — E118 Type 2 diabetes mellitus with unspecified complications: Secondary | ICD-10-CM | POA: Diagnosis not present

## 2022-06-10 DIAGNOSIS — T148XXA Other injury of unspecified body region, initial encounter: Secondary | ICD-10-CM | POA: Diagnosis not present

## 2022-06-10 DIAGNOSIS — Z13220 Encounter for screening for lipoid disorders: Secondary | ICD-10-CM

## 2022-06-10 DIAGNOSIS — R531 Weakness: Secondary | ICD-10-CM | POA: Diagnosis not present

## 2022-06-10 DIAGNOSIS — G8929 Other chronic pain: Secondary | ICD-10-CM

## 2022-06-10 DIAGNOSIS — Z9889 Other specified postprocedural states: Secondary | ICD-10-CM

## 2022-06-10 DIAGNOSIS — I6330 Cerebral infarction due to thrombosis of unspecified cerebral artery: Secondary | ICD-10-CM

## 2022-06-10 DIAGNOSIS — M25561 Pain in right knee: Secondary | ICD-10-CM

## 2022-06-10 DIAGNOSIS — Z8719 Personal history of other diseases of the digestive system: Secondary | ICD-10-CM

## 2022-06-10 DIAGNOSIS — R0981 Nasal congestion: Secondary | ICD-10-CM

## 2022-06-10 LAB — SARS CORONAVIRUS 2 (TAT 6-24 HRS): SARS Coronavirus 2: NEGATIVE

## 2022-06-10 LAB — POCT GLYCOSYLATED HEMOGLOBIN (HGB A1C): Hemoglobin A1C: 7.4 % — AB (ref 4.0–5.6)

## 2022-06-10 MED ORDER — CETIRIZINE HCL 10 MG PO TABS
10.0000 mg | ORAL_TABLET | Freq: Every day | ORAL | 11 refills | Status: DC
  Filled 2022-06-10: qty 30, 30d supply, fill #0

## 2022-06-10 MED ORDER — BENZONATATE 200 MG PO CAPS
200.0000 mg | ORAL_CAPSULE | Freq: Two times a day (BID) | ORAL | 0 refills | Status: DC | PRN
  Filled 2022-06-10: qty 20, 10d supply, fill #0

## 2022-06-10 MED ORDER — FLUTICASONE PROPIONATE 50 MCG/ACT NA SUSP
2.0000 | Freq: Every day | NASAL | 6 refills | Status: DC
  Filled 2022-06-10: qty 16, 30d supply, fill #0

## 2022-06-10 MED ORDER — KETOROLAC TROMETHAMINE 30 MG/ML IJ SOLN
30.0000 mg | Freq: Once | INTRAMUSCULAR | 0 refills | Status: AC
  Filled 2022-06-10: qty 1, 1d supply, fill #0

## 2022-06-10 NOTE — Assessment & Plan Note (Signed)
-   Ambulatory referral to General Surgery  2. History of inguinal hernia repair  - Ambulatory referral to General Surgery  3. DM type 2, controlled, with complication ([place])  - POCT glycosylated hemoglobin (Hb A1C) - Ambulatory referral to Podiatry  4. Sinus congestion  - fluticasone (FLONASE) 50 MCG/ACT nasal spray; Place 2 sprays into both nostrils daily.  Dispense: 16 g; Refill: 6 - cetirizine (ZYRTEC) 10 MG tablet; Take 1 tablet (10 mg total) by mouth daily.  Dispense: 30 tablet; Refill: 11 - benzonatate (TESSALON) 200 MG capsule; Take 1 capsule (200 mg total) by mouth 2 (two) times daily as needed for cough.  Dispense: 20 capsule; Refill: 0  5. 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 Right sided weakness  - Ambulatory referral to Physical Therapy   Follow up:  Follow up in 3 months

## 2022-06-10 NOTE — Progress Notes (Signed)
$@PatientI$  ID: Aaron Kennedy, male    DOB: Dec 29, 1970, 52 y.o.   MRN: [id number]  Chief Complaint  Patient presents with   Follow-up   Cough   Nasal Congestion    Referring provider: Fenton Foy, NP   HPI  Aaron Kennedy presents for follow up. He  has a past medical history of Anxiety, Chronic pain of right knee, Hypertension, Insomnia (01/2019), and Stroke (2016).      Hypertension:   Patient also presents today for follow-up on hypertension.  Patient states that he is compliant with Zestoretic.  He states he has been doing well with no new issues or concerns.  Blood pressure was stable in office today.     Diabetes:  Current medications: metformin 500 mg BID  Patient is not checking home blood sugars.   Home blood sugar records: patient does not check sugars How often is blood sugars being checked: N/A Current symptoms/problems include none and have been stable. Daily foot checks: yes   Any foot concerns: none Last eye exam: N/A Exercise:  staying active QD A1C at last visit 03/01/22 = 7.3 A1C today = 7.4  Has had eye exam this year  Reports Sinus drainage and cough since last weekend. He was seen in the ED yesterday and tested negative for covid and flu. Denies fever.   Declines labs today  Patient is requesting a referral to general surgery.  He does have a past history of left inguinal hernia repair with mesh.  He states that when he had that surgery he did have complications that resulted in an open wound.  This area has never closed and he is still packing it with saline gauze daily.  Will place a referral back to general surgery for repair to this area.  Patient is requesting referral to PT for right side weakness after his stroke. He has not been able to get PT due to lack of insurance. He now has medicaid. Also requesting toradol injection for chronic right knee pain. Denies f/c/s, n/v/d, hemoptysis, PND, leg swelling Denies chest pain or  edema    No Known Allergies  Immunization History  Administered Date(s) Administered   Influenza,inj,Quad PF,6+ Mos 12/29/2015, 02/06/2019, 02/25/2021, 03/01/2022   Pneumococcal Polysaccharide-23 09/26/2015   Tdap 12/29/2015    Past Medical History:  Diagnosis Date   Anxiety    Chronic pain of right knee    DM type 2, controlled, with complication ([place]) [id number]   Hypertension    Insomnia 01/2019   Stroke ([place])     Tobacco History: Social History   Tobacco Use  Smoking Status Every Day   Packs/day: 0.50   Years: 20.00   Total pack years: 10.00   Types: Cigarettes  Smokeless Tobacco Never   Ready to quit: Not Answered Counseling given: Not Answered   Outpatient Encounter Medications as of 06/10/2022  Medication Sig   atorvastatin (LIPITOR) 80 MG tablet Take 1 tablet (80 mg total) by mouth daily.   benzonatate (TESSALON) 200 MG capsule Take 1 capsule (200 mg total) by mouth 2 (two) times daily as needed for cough.   cetirizine (ZYRTEC) 10 MG tablet Take 1 tablet (10 mg total) by mouth daily.   clopidogrel (PLAVIX) 75 MG tablet Take 1 tablet (75 mg total) by mouth daily.   fluticasone (FLONASE) 50 MCG/ACT nasal spray Place 2 sprays into both nostrils daily.   gabapentin (NEURONTIN) 600 MG tablet Take 1 tablet (600 mg total) by mouth 2 (two) times  daily.   ketorolac (TORADOL) 30 MG/ML injection Inject 1 mL (30 mg total) into the vein once for 1 dose.   lisinopril-hydrochlorothiazide (ZESTORETIC) 10-12.5 MG tablet TAKE 1 TABLET BY MOUTH DAILY.   metFORMIN (GLUCOPHAGE) 500 MG tablet TAKE 1 TABLET (500 MG TOTAL) BY MOUTH 2 (TWO) TIMES DAILY WITH A MEAL.   metoprolol succinate (TOPROL-XL) 25 MG 24 hr tablet TAKE 1 TABLET (25 MG TOTAL) BY MOUTH DAILY.   No facility-administered encounter medications on file as of 06/10/2022.     Review of Systems  Review of Systems  Constitutional: Negative.   HENT:  Positive for congestion, postnasal drip, sinus pressure and sinus  pain.   Respiratory:  Positive for cough and shortness of breath.   Cardiovascular: Negative.   Gastrointestinal: Negative.   Musculoskeletal:  Positive for arthralgias and myalgias.  Allergic/Immunologic: Negative.   Neurological:  Positive for weakness (right side residual from CVA).  Psychiatric/Behavioral: Negative.         Physical Exam  BP 127/89   Pulse 72   Temp 97.7 F (36.5 C)   Ht 5' 11"$  (1.803 m)   Wt 231 lb 3.2 oz (104.9 kg)   SpO2 94%   BMI 32.25 kg/m   Wt Readings from Last 5 Encounters:  06/10/22 231 lb 3.2 oz (104.9 kg)  03/01/22 232 lb (105.2 kg)  11/26/21 230 lb 8 oz (104.6 kg)  08/26/21 230 lb (104.3 kg)  06/30/21 231 lb (104.8 kg)     Physical Exam Vitals and nursing note reviewed.  Constitutional:      General: He is not in acute distress.    Appearance: He is well-developed.  Cardiovascular:     Rate and Rhythm: Normal rate and regular rhythm.  Pulmonary:     Effort: Pulmonary effort is normal.     Breath sounds: Normal breath sounds.  Skin:    General: Skin is warm and dry.  Neurological:     Mental Status: He is alert and oriented to person, place, and time.       Assessment & Plan:   Skin wound from surgical incision - Ambulatory referral to General Surgery  2. History of inguinal hernia repair  - Ambulatory referral to General Surgery  3. DM type 2, controlled, with complication ([place])  - POCT glycosylated hemoglobin (Hb A1C) - Ambulatory referral to Podiatry  4. Sinus congestion  - fluticasone (FLONASE) 50 MCG/ACT nasal spray; Place 2 sprays into both nostrils daily.  Dispense: 16 g; Refill: 6 - cetirizine (ZYRTEC) 10 MG tablet; Take 1 tablet (10 mg total) by mouth daily.  Dispense: 30 tablet; Refill: 11 - benzonatate (TESSALON) 200 MG capsule; Take 1 capsule (200 mg total) by mouth 2 (two) times daily as needed for cough.  Dispense: 20 capsule; Refill: 0  5. Cerebrovascular accident (CVA) due to thrombosis of cerebral  artery ([place])  - Ambulatory referral to Physical Therapy  6. Right sided weakness  - Ambulatory referral to Physical Therapy   Follow up:  Follow up in 3 months     Fenton Foy, NP 06/10/2022

## 2022-06-10 NOTE — Patient Instructions (Addendum)
1. Skin wound from surgical incision  - Ambulatory referral to General Surgery  2. History of inguinal hernia repair  - Ambulatory referral to General Surgery  3. DM type 2, controlled, with complication ([place])  - POCT glycosylated hemoglobin (Hb A1C) - Ambulatory referral to Podiatry  4. Sinus congestion  - fluticasone (FLONASE) 50 MCG/ACT nasal spray; Place 2 sprays into both nostrils daily.  Dispense: 16 g; Refill: 6 - cetirizine (ZYRTEC) 10 MG tablet; Take 1 tablet (10 mg total) by mouth daily.  Dispense: 30 tablet; Refill: 11 - benzonatate (TESSALON) 200 MG capsule; Take 1 capsule (200 mg total) by mouth 2 (two) times daily as needed for cough.  Dispense: 20 capsule; Refill: 0  5. Cerebrovascular accident (CVA) due to thrombosis of cerebral artery ([place])  - Ambulatory referral to Physical Therapy  6. Right sided weakness  - Ambulatory referral to Physical Therapy   Follow up:  Follow up in 3 months

## 2022-06-11 ENCOUNTER — Other Ambulatory Visit: Payer: Self-pay

## 2022-06-11 MED ORDER — KETOROLAC TROMETHAMINE 30 MG/ML IJ SOLN
30.0000 mg | Freq: Once | INTRAMUSCULAR | Status: AC
  Administered 2022-06-10: 30 mg via INTRAMUSCULAR

## 2022-06-11 NOTE — Addendum Note (Signed)
Addended by: Elyse Jarvis on: 06/11/2022 04:54 PM   Modules accepted: Orders

## 2022-06-17 ENCOUNTER — Other Ambulatory Visit: Payer: Self-pay | Admitting: Podiatry

## 2022-06-17 ENCOUNTER — Ambulatory Visit (INDEPENDENT_AMBULATORY_CARE_PROVIDER_SITE_OTHER): Payer: Medicaid Other

## 2022-06-17 ENCOUNTER — Ambulatory Visit (INDEPENDENT_AMBULATORY_CARE_PROVIDER_SITE_OTHER): Payer: Medicaid Other | Admitting: Podiatry

## 2022-06-17 DIAGNOSIS — M19071 Primary osteoarthritis, right ankle and foot: Secondary | ICD-10-CM

## 2022-06-17 DIAGNOSIS — M7751 Other enthesopathy of right foot: Secondary | ICD-10-CM | POA: Diagnosis not present

## 2022-06-17 DIAGNOSIS — E118 Type 2 diabetes mellitus with unspecified complications: Secondary | ICD-10-CM

## 2022-06-17 DIAGNOSIS — R52 Pain, unspecified: Secondary | ICD-10-CM | POA: Diagnosis not present

## 2022-06-17 DIAGNOSIS — S9031XA Contusion of right foot, initial encounter: Secondary | ICD-10-CM | POA: Diagnosis not present

## 2022-06-17 NOTE — Progress Notes (Signed)
Subjective:  Patient ID: Aaron Kennedy, male    DOB: 02/01/1971,  MRN: [id number]  Chief Complaint  Patient presents with   Foot Pain    Right foot/ankle may have a spur. Right foot has gotten worse since his stroke back in 2006.    52 y.o. male presents with concern for pain in the right foot on the top of the foot.  He says the right foot has gotten some worse he has had significant weakness in the foot since he had a stroke in 2016.  He says he is going to start rehab for the right side in March of this year.  He says there is a prominent bump on the top of the right foot that causes him significant pain wearing shoes.  He does have a history of diabetes type 2 but he says it is well-controlled and he is not taking any medication other than metformin for it at this time.  Has been told his A1c level is around 7.  He also notes some pain on the outside of his right foot underneath the fibula however he says it is only pain there if he presses on it not with walking or wearing shoes.  Past Medical History:  Diagnosis Date   Anxiety    Chronic pain of right knee    DM type 2, controlled, with complication ([place]) [id number]   Hypertension    Insomnia 01/2019   Stroke ([place])     No Known Allergies  ROS: Negative except as per HPI above  Objective:  General: AAO x3, NAD  Dermatological: With inspection and palpation of the right and left lower extremities there are no open sores, no preulcerative lesions, no rash or signs of infection present. Nails are of normal length thickness and coloration.   Vascular:  Dorsalis Pedis artery and Posterior Tibial artery pedal pulses are 2/4 bilateral.  Capillary fill time < 3 sec to all digits.   Neruologic: Grossly intact via light touch bilateral. Protective threshold intact to all sites bilateral.   Musculoskeletal: Attention directed to the right dorsal midfoot approximately at the first and second tarsometatarsal joints are generally large  dorsal osseous prominence consistent with dorsal exostosis at the TMT J.  Pain with palpation of the area.  Gait: Unassisted, Nonantalgic.   No images are attached to the encounter.  Radiographs:  Date: 06/17/2022 XR the right foot Weightbearing AP/Lateral/Oblique   Findings: Attention directed to the first tarsometatarsal joint on lateral view there is noted to be large dorsal prominence with osteophyte formation consistent with osseous spurring related to osteoarthritis.  On oblique view there is maintained joint space though slightly irregular does not appear to be overly osteoarthritic in the joint itself. Assessment:   1. Bone spur of right foot   2. Arthritis of right midfoot   3. Pain   4. DM type 2, controlled, with complication ([place])      Plan:  Patient was evaluated and treated and all questions answered.  # Dorsal exostosis at the first tarsometatarsal joint right foot -Discussed with the patient that he does appear to have a very large osseous spur present at the dorsal right midfoot -Discussed conservatively could offload the area with a donut pad use anti-inflammatory medications for pain relief and skip laces on the shoes. -He states he has tried most of these things without relief. -Patient wishes to proceed with surgical correction. -Discussed surgery in this case would involve dorsal midfoot exostectomy.  Do not believe  we need to proceed with arthrodesis of the first MPJ as the joint does not appear to be grossly abnormal on x-ray however there is always the chance that there is some residual pain related to joint osteoarthritis following removal of the spur his primary complaint is related to the bump itself and pressure on the area. -Discussed the risk benefits alternatives and possible complications associate with this procedure as well as the expected postoperative recovery course.  Patient wishes to proceed informed surgical consent was obtained patient will  follow-up postoperatively.  Begin surgical planning.   No follow-ups on file.          Everitt Amber, DPM [facility]

## 2022-06-18 ENCOUNTER — Other Ambulatory Visit: Payer: Self-pay

## 2022-06-28 ENCOUNTER — Telehealth: Payer: Self-pay | Admitting: Urology
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
## 2022-06-28 NOTE — Telephone Encounter (Signed)
DOS - 07/14/22  TARSAL EXOSTECTOMY RIGHT --- [id number]  [employer]   RECEIVED FAX FROM [employer] STATED THAT CPT CODE 60454 HAS BEEN APPROVED, AUTH # [id number], GOOD FROM 07/14/22 - 08/12/22.

## 2022-06-30 ENCOUNTER — Ambulatory Visit (HOSPITAL_COMMUNITY): Payer: Medicaid Other | Attending: Nurse Practitioner

## 2022-06-30 ENCOUNTER — Other Ambulatory Visit: Payer: Self-pay

## 2022-06-30 DIAGNOSIS — R531 Weakness: Secondary | ICD-10-CM

## 2022-06-30 DIAGNOSIS — R262 Difficulty in walking, not elsewhere classified: Secondary | ICD-10-CM

## 2022-06-30 DIAGNOSIS — I6330 Cerebral infarction due to thrombosis of unspecified cerebral artery: Secondary | ICD-10-CM | POA: Diagnosis not present

## 2022-07-08 ENCOUNTER — Ambulatory Visit (HOSPITAL_COMMUNITY): Payer: Medicaid Other | Admitting: Physical Therapy

## 2022-07-08 DIAGNOSIS — R531 Weakness: Secondary | ICD-10-CM

## 2022-07-08 DIAGNOSIS — R262 Difficulty in walking, not elsewhere classified: Secondary | ICD-10-CM | POA: Diagnosis not present

## 2022-07-08 DIAGNOSIS — I6330 Cerebral infarction due to thrombosis of unspecified cerebral artery: Secondary | ICD-10-CM | POA: Diagnosis not present
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
## 2022-07-12 ENCOUNTER — Other Ambulatory Visit: Payer: Self-pay

## 2022-07-12 ENCOUNTER — Other Ambulatory Visit (INDEPENDENT_AMBULATORY_CARE_PROVIDER_SITE_OTHER): Payer: Medicaid Other | Admitting: Podiatry

## 2022-07-12 DIAGNOSIS — Z9889 Other specified postprocedural states: Secondary | ICD-10-CM

## 2022-07-12 MED ORDER — OXYCODONE-ACETAMINOPHEN 5-325 MG PO TABS
1.0000 | ORAL_TABLET | ORAL | 0 refills | Status: DC | PRN
  Filled 2022-07-12: qty 20, 4d supply, fill #0

## 2022-07-12 MED ORDER — CEPHALEXIN 500 MG PO CAPS
500.0000 mg | ORAL_CAPSULE | Freq: Three times a day (TID) | ORAL | 0 refills | Status: DC
  Filled 2022-07-12: qty 15, 5d supply, fill #0

## 2022-07-12 MED ORDER — IBUPROFEN 600 MG PO TABS
600.0000 mg | ORAL_TABLET | Freq: Three times a day (TID) | ORAL | 0 refills | Status: DC | PRN
  Filled 2022-07-12: qty 30, 10d supply, fill #0

## 2022-07-12 NOTE — Progress Notes (Signed)
Postop medications sent 

## 2022-07-14 ENCOUNTER — Encounter: Payer: Self-pay | Admitting: Podiatry

## 2022-07-14 DIAGNOSIS — M85871 Other specified disorders of bone density and structure, right ankle and foot: Secondary | ICD-10-CM | POA: Diagnosis not present

## 2022-07-14 DIAGNOSIS — M25774 Osteophyte, right foot: Secondary | ICD-10-CM | POA: Diagnosis not present

## 2022-07-14 DIAGNOSIS — M25771 Osteophyte, right ankle: Secondary | ICD-10-CM | POA: Diagnosis not present

## 2022-07-20 ENCOUNTER — Encounter (HOSPITAL_COMMUNITY): Payer: Medicaid Other | Admitting: Physical Therapy

## 2022-07-20 ENCOUNTER — Other Ambulatory Visit: Payer: Self-pay

## 2022-07-22 ENCOUNTER — Encounter: Payer: Self-pay | Admitting: Podiatry

## 2022-07-22 ENCOUNTER — Ambulatory Visit (INDEPENDENT_AMBULATORY_CARE_PROVIDER_SITE_OTHER): Payer: Medicaid Other | Admitting: Podiatry

## 2022-07-22 ENCOUNTER — Ambulatory Visit (HOSPITAL_COMMUNITY): Payer: Medicaid Other | Attending: Nurse Practitioner

## 2022-07-22 ENCOUNTER — Other Ambulatory Visit: Payer: Self-pay

## 2022-07-22 DIAGNOSIS — R531 Weakness: Secondary | ICD-10-CM | POA: Insufficient documentation

## 2022-07-22 DIAGNOSIS — M19071 Primary osteoarthritis, right ankle and foot: Secondary | ICD-10-CM

## 2022-07-22 DIAGNOSIS — M7751 Other enthesopathy of right foot: Secondary | ICD-10-CM

## 2022-07-22 DIAGNOSIS — Z9889 Other specified postprocedural states: Secondary | ICD-10-CM

## 2022-07-22 DIAGNOSIS — R262 Difficulty in walking, not elsewhere classified: Secondary | ICD-10-CM | POA: Insufficient documentation

## 2022-07-22 MED ORDER — OXYCODONE-ACETAMINOPHEN 5-325 MG PO TABS
1.0000 | ORAL_TABLET | ORAL | 0 refills | Status: AC | PRN
  Filled 2022-07-22: qty 15, 3d supply, fill #0

## 2022-07-22 NOTE — Therapy (Signed)
[facility] at [place], [place], [address] Phone: [phone]   Fax:  [phone]  Patient Details  Name: Aaron Kennedy MRN: [id number] Date of Birth: [birth year] Referring Provider:  Fenton Foy, NP  Encounter Date: 07/22/2022   Patient arrives for his appointment this morning but he recently had surgery (last Thurs) on his right foot and it is quite swollen. He states he is supposed to be wearing a boot on that right foot and has a post op appointment with his surgeon today.  He wanted to come because he did not want to be "kicked out" of therapy for missing appointments.  Instructed patient that as long as he calls to cancel appointments beforehand he will not be discharged from therapy for missed appointments and to go home and elevate foot; wear his boot as instructed.  Patient verbalizes understanding.  We will plan to see him back 08/03/22 to give him some time to recover from foot surgery.    7:51 AM, 07/22/22 [place] Rosamilia Small Gitel Beste MPT [place] [facility] [place] [id number] [place] at [place], [place], [address] Phone: [phone]   Fax:  [phone]

## 2022-07-22 NOTE — Addendum Note (Signed)
Addended by: Ames Coupe F on: 07/22/2022 12:58 PM   Modules accepted: Orders

## 2022-07-22 NOTE — Progress Notes (Signed)
  Subjective:  Patient ID: Aaron Kennedy, male    DOB: 1970-08-05,  MRN: [id number]  Chief Complaint  Patient presents with   Routine Post Op    POV 1 Patient states that his pain is currently a 9/10    DOS: 07/14/22 Procedure: Midfoot exostectomy of right first tarsometatarsal joint area right foot  52 y.o. male returns for post-op check.  Patient is approximately 1 week status post right midfoot exostectomy.  He has been weightbearing as tolerated in regular shoe he was initially advised to wear a postoperative shoe but said that he felt like he had on laces shoe and wear that instead.  Has been having some pain but does admit that he has been doing a lot on his foot more than was advised.  Review of Systems: Negative except as noted in the HPI. Denies N/V/F/Ch.   Objective:  There were no vitals filed for this visit. There is no height or weight on file to calculate BMI. Constitutional Well developed. Well nourished.  Vascular Foot warm and well perfused. Capillary refill normal to all digits.  Calf is soft and supple, no posterior calf or knee pain, negative Homans' sign  Neurologic Normal speech. Oriented to person, place, and time. Epicritic sensation to light touch grossly present bilaterally.  Dermatologic Skin healing well without signs of infection. Skin edges well coapted without signs of infection.  Orthopedic: Tenderness to palpation noted about the surgical site.   Multiple view plain film radiographs: 07/22/2022 XR 3 views AP lateral oblique of the right foot weightbearing.  Findings: Decreased osseous spurring at the dorsal aspect of the first tarsometatarsal joint on lateral view improved from prior Assessment:   1. Post-operative state   2. Bone spur of right foot   3. Arthritis of right midfoot    Plan:  Patient was evaluated and treated and all questions answered.  S/p foot surgery right midfoot exostectomy -Progressing as expected post-operatively. -XR:  As above no acute complication -WB Status: Weightbearing as tolerated post in postop shoe -Sutures: Remain intact until next visit. -Medications: Refill of Percocet sent -Foot redressed.  Dressing remain clean dry and intact until next visit  Return in about 1 week (around 07/29/2022) for Second POV right midfoot exostectomy.         Everitt Amber, DPM [facility]

## 2022-07-23 ENCOUNTER — Other Ambulatory Visit: Payer: Self-pay

## 2022-07-26 ENCOUNTER — Encounter (HOSPITAL_COMMUNITY): Payer: Medicaid Other | Admitting: Physical Therapy

## 2022-07-28 ENCOUNTER — Encounter (HOSPITAL_COMMUNITY): Payer: Medicaid Other

## 2022-08-03 ENCOUNTER — Ambulatory Visit (HOSPITAL_COMMUNITY): Payer: Medicaid Other

## 2022-08-03 DIAGNOSIS — R531 Weakness: Secondary | ICD-10-CM | POA: Diagnosis not present

## 2022-08-03 DIAGNOSIS — R262 Difficulty in walking, not elsewhere classified: Secondary | ICD-10-CM | POA: Diagnosis not present
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
## 2022-08-05 ENCOUNTER — Other Ambulatory Visit: Payer: Self-pay

## 2022-08-05 ENCOUNTER — Ambulatory Visit (INDEPENDENT_AMBULATORY_CARE_PROVIDER_SITE_OTHER): Payer: Medicaid Other | Admitting: Podiatry

## 2022-08-05 ENCOUNTER — Encounter (HOSPITAL_COMMUNITY): Payer: Medicaid Other | Admitting: Physical Therapy

## 2022-08-05 DIAGNOSIS — Z9889 Other specified postprocedural states: Secondary | ICD-10-CM

## 2022-08-05 DIAGNOSIS — M7751 Other enthesopathy of right foot: Secondary | ICD-10-CM

## 2022-08-05 MED ORDER — OXYCODONE-ACETAMINOPHEN 5-325 MG PO TABS
1.0000 | ORAL_TABLET | ORAL | 0 refills | Status: DC | PRN
  Filled 2022-08-05: qty 10, 2d supply, fill #0

## 2022-08-05 NOTE — Progress Notes (Signed)
  Subjective:  Patient ID: Aaron Kennedy, male    DOB: 1970-05-13,  MRN: [id number]  Chief Complaint  Patient presents with   Follow-up    Burning pain at night.     DOS: 07/14/22 Procedure: Midfoot exostectomy of right first tarsometatarsal joint area right foot  52 y.o. male returns for post-op check.  Patient is approximately 2-week status post right midfoot exostectomy.  He is now weightbearing in regular shoes.  He does admit he has been doing a little bit more on his foot than he was advised to.  Having some burning pain at the site of the procedure otherwise doing well able to move his toes and has full sensation which is improved from preoperative.  Review of Systems: Negative except as noted in the HPI. Denies N/V/F/Ch.   Objective:  There were no vitals filed for this visit. There is no height or weight on file to calculate BMI. Constitutional Well developed. Well nourished.  Vascular Foot warm and well perfused. Capillary refill normal to all digits.  Calf is soft and supple, no posterior calf or knee pain, negative Homans' sign  Neurologic Normal speech. Oriented to person, place, and time. Epicritic sensation to light touch grossly present bilaterally.  Dermatologic Skin healing well without signs of infection. Skin edges well coapted without signs of infection.  Mild eschar at the incision site.  Sutures are intact.  He does have mild incisional erythema noted to the right midfoot.   Orthopedic: Tenderness to palpation noted about the surgical site.   Multiple view plain film radiographs: Deferred at this visit Assessment:   1. Post-operative state   2. Bone spur of right foot     Plan:  Patient was evaluated and treated and all questions answered.  S/p foot surgery right midfoot exostectomy -Progressing as expected post-operatively.  Incision with mild incisional erythema however does not appear acutely infected we will continue to monitor no active  drainage. -XR: Deferred at this visit -WB Status: Weightbearing as tolerated post in regular shoes -Sutures: Removed in total at this visit -Medications: Refill of Percocet sent -Foot redressed with iodine ointment and Band-Aids.  Patient can begin daily dressing changes and get the incision wet in the shower with warm soapy water and redress with adhesive bandage.  Return in about 2 weeks (around 08/19/2022).         Corinna Gab, DPM [facility]

## 2022-08-05 NOTE — Addendum Note (Signed)
Addended by: Carlena Hurl F on: 08/05/2022 08:57 AM   Modules accepted: Orders

## 2022-08-06 ENCOUNTER — Other Ambulatory Visit: Payer: Self-pay

## 2022-08-09 ENCOUNTER — Ambulatory Visit (HOSPITAL_COMMUNITY): Payer: Medicaid Other | Admitting: Physical Therapy

## 2022-08-09 DIAGNOSIS — R262 Difficulty in walking, not elsewhere classified: Secondary | ICD-10-CM | POA: Diagnosis not present

## 2022-08-09 DIAGNOSIS — R531 Weakness: Secondary | ICD-10-CM
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
## 2022-08-10 NOTE — Addendum Note (Signed)
Addended byWilhemena Durie S on: 08/10/2022 08:56 AM   Modules accepted: Orders

## 2022-08-11 ENCOUNTER — Ambulatory Visit (HOSPITAL_COMMUNITY): Payer: Medicaid Other | Admitting: Physical Therapy

## 2022-08-11 DIAGNOSIS — R262 Difficulty in walking, not elsewhere classified: Secondary | ICD-10-CM

## 2022-08-11 DIAGNOSIS — R531 Weakness: Secondary | ICD-10-CM
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
## 2022-08-17 ENCOUNTER — Other Ambulatory Visit: Payer: Self-pay | Admitting: Podiatry

## 2022-08-17 ENCOUNTER — Other Ambulatory Visit: Payer: Self-pay

## 2022-08-17 MED ORDER — OXYCODONE-ACETAMINOPHEN 5-325 MG PO TABS
1.0000 | ORAL_TABLET | ORAL | 0 refills | Status: DC | PRN
  Filled 2022-08-17: qty 10, 2d supply, fill #0

## 2022-08-18 ENCOUNTER — Ambulatory Visit (HOSPITAL_COMMUNITY): Payer: Medicaid Other | Attending: Nurse Practitioner | Admitting: Physical Therapy

## 2022-08-18 DIAGNOSIS — R531 Weakness: Secondary | ICD-10-CM | POA: Diagnosis not present

## 2022-08-18 DIAGNOSIS — R262 Difficulty in walking, not elsewhere classified: Secondary | ICD-10-CM | POA: Diagnosis not present
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
## 2022-08-19 ENCOUNTER — Ambulatory Visit (HOSPITAL_COMMUNITY): Payer: Medicaid Other | Admitting: Physical Therapy

## 2022-08-19 ENCOUNTER — Ambulatory Visit: Payer: Medicaid Other | Admitting: Podiatry

## 2022-08-19 DIAGNOSIS — M19071 Primary osteoarthritis, right ankle and foot: Secondary | ICD-10-CM

## 2022-08-19 DIAGNOSIS — R262 Difficulty in walking, not elsewhere classified: Secondary | ICD-10-CM

## 2022-08-19 DIAGNOSIS — R531 Weakness: Secondary | ICD-10-CM | POA: Diagnosis not present

## 2022-08-19 DIAGNOSIS — Z9889 Other specified postprocedural states: Secondary | ICD-10-CM

## 2022-08-19 DIAGNOSIS — M7751 Other enthesopathy of right foot: Secondary | ICD-10-CM | POA: Diagnosis not present

## 2022-08-19 DIAGNOSIS — M76821 Posterior tibial tendinitis, right leg: Secondary | ICD-10-CM
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
## 2022-08-19 NOTE — Progress Notes (Signed)
  Subjective:  Patient ID: Aaron Kennedy, male    DOB: 25-Jun-1970,  MRN: [id number]  Chief Complaint  Patient presents with   Follow-up    Patient states his incision is burning a little. But overall  he is doing better.    DOS: 07/14/22 Procedure: Midfoot exostectomy of right first tarsometatarsal joint area right foot  52 y.o. male returns for post-op check.  Patient is approximately 4-week status post right midfoot exostectomy.  He is now weightbearing in regular shoes. He states the scab came off the left foot.  He says that the pain is decreased significantly since last visit has a little burning over the incision but overall is improving and feeling good.  Review of Systems: Negative except as noted in the HPI. Denies N/V/F/Ch.   Objective:  There were no vitals filed for this visit. There is no height or weight on file to calculate BMI. Constitutional Well developed. Well nourished.  Vascular Foot warm and well perfused. Capillary refill normal to all digits.  Calf is soft and supple, no posterior calf or knee pain, negative Homans' sign  Neurologic Normal speech. Oriented to person, place, and time. Epicritic sensation to light touch grossly present bilaterally.  Dermatologic Skin healing well without signs of infection. Skin edges well coapted without signs of infection.  Decreased eschar and decreased erythema over the incision line.  This is healing well with minimal scar tissue at this time.  Improved from prior    Orthopedic: Tenderness to palpation noted about the surgical site.  Pain with palpation along the course of the deltoid ligament and the posterior tibial tendon on the right ankle   Multiple view plain film radiographs: Deferred at this visit Assessment:   1. Post-operative state   2. Bone spur of right foot   3. Arthritis of right midfoot   4. Posterior tibial tendinitis of right leg      Plan:  Patient was evaluated and treated and all questions  answered.  S/p foot surgery right midfoot exostectomy -Progressing as expected post-operatively.   -Improving with decreased concern for any wound problem at the incision site -XR: Deferred at this visit -WB Status: Weightbearing as tolerated post in regular shoes -Medications: Tylenol as needed for pain -No need for further dressings -Continue to monitor  # Posterior tibial tendinitis and deltoid ligament pain on the right ankle -Discussed with patient he does have evidence of deltoid ligament injury versus posterior tibial tendon inflammation likely result of prior ankle sprains from [place] basketball -Recommend steroid injection.  After sterile alcohol prep proceed with injection of 1 cc half percent Marcaine plain with 1 cc Kenalog 10 to the right ankle.  Return in about 6 weeks (around 09/30/2022) for f/u R foot ankle pain and surgical site.         Corinna Gab, DPM [facility] / [facility]

## 2022-08-20 ENCOUNTER — Other Ambulatory Visit: Payer: Self-pay

## 2022-08-23 ENCOUNTER — Encounter (HOSPITAL_COMMUNITY): Payer: Medicaid Other | Admitting: Physical Therapy

## 2022-08-25 ENCOUNTER — Ambulatory Visit (HOSPITAL_COMMUNITY): Payer: Medicaid Other

## 2022-08-25 DIAGNOSIS — R262 Difficulty in walking, not elsewhere classified: Secondary | ICD-10-CM | POA: Diagnosis not present

## 2022-08-25 DIAGNOSIS — R531 Weakness: Secondary | ICD-10-CM | POA: Diagnosis not present
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
## 2022-08-27 ENCOUNTER — Other Ambulatory Visit: Payer: Self-pay

## 2022-08-27 ENCOUNTER — Other Ambulatory Visit: Payer: Self-pay | Admitting: Podiatry

## 2022-08-30 ENCOUNTER — Other Ambulatory Visit: Payer: Self-pay

## 2022-08-30 MED ORDER — OXYCODONE-ACETAMINOPHEN 5-325 MG PO TABS
1.0000 | ORAL_TABLET | ORAL | 0 refills | Status: DC | PRN
  Filled 2022-08-30: qty 10, 2d supply, fill #0

## 2022-08-30 NOTE — Telephone Encounter (Signed)
Prescription needs to be refilled. Thanks

## 2022-09-01 ENCOUNTER — Ambulatory Visit (HOSPITAL_COMMUNITY): Payer: Medicaid Other | Admitting: Physical Therapy

## 2022-09-01 DIAGNOSIS — R531 Weakness: Secondary | ICD-10-CM | POA: Diagnosis not present

## 2022-09-01 DIAGNOSIS — R262 Difficulty in walking, not elsewhere classified: Secondary | ICD-10-CM | POA: Diagnosis not present
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
## 2022-09-02 ENCOUNTER — Ambulatory Visit (HOSPITAL_COMMUNITY): Payer: Medicaid Other | Admitting: Physical Therapy

## 2022-09-02 DIAGNOSIS — R531 Weakness: Secondary | ICD-10-CM

## 2022-09-02 DIAGNOSIS — R262 Difficulty in walking, not elsewhere classified: Secondary | ICD-10-CM | POA: Diagnosis not present
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
## 2022-09-08 ENCOUNTER — Ambulatory Visit (HOSPITAL_COMMUNITY): Payer: Medicaid Other | Admitting: Physical Therapy

## 2022-09-08 DIAGNOSIS — R531 Weakness: Secondary | ICD-10-CM | POA: Diagnosis not present

## 2022-09-08 DIAGNOSIS — R262 Difficulty in walking, not elsewhere classified: Secondary | ICD-10-CM
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
## 2022-09-09 ENCOUNTER — Other Ambulatory Visit: Payer: Self-pay | Admitting: Podiatry

## 2022-09-09 ENCOUNTER — Encounter: Payer: Self-pay | Admitting: Nurse Practitioner

## 2022-09-09 ENCOUNTER — Ambulatory Visit: Payer: Medicaid Other | Admitting: Nurse Practitioner

## 2022-09-09 ENCOUNTER — Other Ambulatory Visit: Payer: Self-pay

## 2022-09-09 ENCOUNTER — Encounter (HOSPITAL_COMMUNITY): Payer: Medicaid Other | Admitting: Physical Therapy

## 2022-09-09 VITALS — BP 128/76 | HR 65 | Ht 71.0 in | Wt 230.8 lb

## 2022-09-09 DIAGNOSIS — I6330 Cerebral infarction due to thrombosis of unspecified cerebral artery: Secondary | ICD-10-CM

## 2022-09-09 DIAGNOSIS — E118 Type 2 diabetes mellitus with unspecified complications: Secondary | ICD-10-CM | POA: Diagnosis not present

## 2022-09-09 DIAGNOSIS — R531 Weakness: Secondary | ICD-10-CM | POA: Diagnosis not present

## 2022-09-09 DIAGNOSIS — Z13220 Encounter for screening for lipoid disorders: Secondary | ICD-10-CM | POA: Diagnosis not present

## 2022-09-09 DIAGNOSIS — M25571 Pain in right ankle and joints of right foot: Secondary | ICD-10-CM | POA: Diagnosis not present

## 2022-09-09 DIAGNOSIS — G8929 Other chronic pain: Secondary | ICD-10-CM

## 2022-09-09 LAB — POCT GLYCOSYLATED HEMOGLOBIN (HGB A1C): Hemoglobin A1C: 8.1 % — AB (ref 4.0–5.6)

## 2022-09-09 MED ORDER — METFORMIN HCL ER 500 MG PO TB24
500.0000 mg | ORAL_TABLET | Freq: Two times a day (BID) | ORAL | 2 refills | Status: DC
Start: 2022-09-09 — End: 2022-12-23
  Filled 2022-09-09 – 2022-09-20 (×2): qty 60, 30d supply, fill #0
  Filled 2022-10-22 – 2022-11-17 (×2): qty 60, 30d supply, fill #1

## 2022-09-09 MED ORDER — METFORMIN HCL ER 500 MG PO TB24
500.0000 mg | ORAL_TABLET | Freq: Every day | ORAL | 2 refills | Status: DC
Start: 2022-09-09 — End: 2022-09-09
  Filled 2022-09-09: qty 60, 60d supply, fill #0

## 2022-09-09 MED ORDER — KETOROLAC TROMETHAMINE 30 MG/ML IJ SOLN
30.0000 mg | Freq: Once | INTRAMUSCULAR | Status: AC
Start: 2022-09-09 — End: 2022-09-09
  Administered 2022-09-09: 30 mg via INTRAMUSCULAR

## 2022-09-09 NOTE — Patient Instructions (Addendum)
.  1. DM type 2, controlled, with complication (HCC)  - POCT glycosylated hemoglobin (Hb A1C) - Microalbumin / creatinine urine ratio - metFORMIN (GLUCOPHAGE-XR) 500 MG 24 hr tablet; Take 1 tablet (500 mg total) by mouth daily with breakfast.  Dispense: 60 tablet; Refill: 2 - CBC - Comprehensive metabolic panel   2. Lipid screening  - Lipid Panel   4. Right sided weakness  - Ambulatory referral to Occupational Therapy    Follow up:  Follow up in 3 months

## 2022-09-09 NOTE — Assessment & Plan Note (Signed)
-   POCT glycosylated hemoglobin (Hb A1C) - Microalbumin / creatinine urine ratio - metFORMIN (GLUCOPHAGE-XR) 500 MG 24 hr tablet; Take 1 tablet (500 mg total) by mouth daily with breakfast.  Dispense: 60 tablet; Refill: 2 - CBC - Comprehensive metabolic panel   2. Lipid screening  - Lipid Panel   4. Right sided weakness  - Ambulatory referral to Occupational Therapy    Follow up:  Follow up in 3 months

## 2022-09-09 NOTE — Progress Notes (Signed)
@Patient  ID: Aaron Kennedy, male    DOB: 22-May-1970, 52 y.o.   MRN: [id number]  Chief Complaint  Patient presents with   Follow-up    Referring provider: Ivonne Andrew, NP   HPI  Aaron Kennedy presents for follow up. He  has a past medical history of Anxiety, Chronic pain of right knee, Hypertension, Insomnia (01/2019), and Stroke (2016).      Hypertension:   Patient also presents today for follow-up on hypertension.  Patient states that he is compliant with Zestoretic.  He states he has been doing well with no new issues or concerns.  Blood pressure was stable in office today.     Diabetes:   Patient is not checking home blood sugars.   Home blood sugar records: patient does not check sugars How often is blood sugars being checked: N/A Current symptoms/problems include none and have been stable. Daily foot checks: yes   Any foot concerns: none Last eye exam: N/A Exercise:  staying active QD   Has followed with podiatry and had bone spur removed. Does have follow up with them in 2 weeks.   Patient is requesting Toradol injection for ongoing right knee pain.     No Known Allergies  Immunization History  Administered Date(s) Administered   Influenza,inj,Quad PF,6+ Mos 12/29/2015, 02/06/2019, 02/25/2021, 03/01/2022   Pneumococcal Polysaccharide-23 09/26/2015   Tdap 12/29/2015    Past Medical History:  Diagnosis Date   Anxiety    Chronic pain of right knee    DM type 2, controlled, with complication (HCC) 08/26/2021   Hypertension    Insomnia 01/2019   Stroke (HCC)     Tobacco History: Social History   Tobacco Use  Smoking Status Every Day   Packs/day: 0.50   Years: 20.00   Additional pack years: 0.00   Total pack years: 10.00   Types: Cigarettes  Smokeless Tobacco Never   Ready to quit: Not Answered Counseling given: Not Answered   Outpatient Encounter Medications as of 09/09/2022  Medication Sig   atorvastatin (LIPITOR) 80 MG tablet  Take 1 tablet (80 mg total) by mouth daily.   clopidogrel (PLAVIX) 75 MG tablet Take 1 tablet (75 mg total) by mouth daily.   fluticasone (FLONASE) 50 MCG/ACT nasal spray Place 2 sprays into both nostrils daily.   gabapentin (NEURONTIN) 600 MG tablet Take 1 tablet (600 mg total) by mouth 2 (two) times daily.   lisinopril-hydrochlorothiazide (ZESTORETIC) 10-12.5 MG tablet TAKE 1 TABLET BY MOUTH DAILY.   metoprolol succinate (TOPROL-XL) 25 MG 24 hr tablet TAKE 1 TABLET (25 MG TOTAL) BY MOUTH DAILY.   [DISCONTINUED] metFORMIN (GLUCOPHAGE) 500 MG tablet TAKE 1 TABLET (500 MG TOTAL) BY MOUTH 2 (TWO) TIMES DAILY WITH A MEAL.   [DISCONTINUED] metFORMIN (GLUCOPHAGE-XR) 500 MG 24 hr tablet Take 1 tablet (500 mg total) by mouth daily with breakfast.   benzonatate (TESSALON) 200 MG capsule Take 1 capsule (200 mg total) by mouth 2 (two) times daily as needed for cough.   cephALEXin (KEFLEX) 500 MG capsule Take 1 capsule (500 mg total) by mouth 3 (three) times daily. (Patient not taking: Reported on 09/09/2022)   cetirizine (ZYRTEC) 10 MG tablet Take 1 tablet (10 mg total) by mouth daily. (Patient not taking: Reported on 09/09/2022)   ibuprofen (ADVIL) 600 MG tablet Take 1 tablet (600 mg total) by mouth every 8 (eight) hours as needed. (Patient not taking: Reported on 09/09/2022)   metFORMIN (GLUCOPHAGE-XR) 500 MG 24 hr tablet Take 1  tablet (500 mg total) by mouth 2 (two) times daily with a meal.   [EXPIRED] ketorolac (TORADOL) 30 MG/ML injection 30 mg    No facility-administered encounter medications on file as of 09/09/2022.     Review of Systems  Review of Systems  Constitutional: Negative.   HENT: Negative.    Cardiovascular: Negative.   Gastrointestinal: Negative.   Allergic/Immunologic: Negative.   Neurological: Negative.   Psychiatric/Behavioral: Negative.         Physical Exam  BP 128/76   Pulse 65   Ht 5\' 11"  (1.803 m)   Wt 230 lb 12.8 oz (104.7 kg)   SpO2 98%   BMI 32.19 kg/m   Wt  Readings from Last 5 Encounters:  09/09/22 230 lb 12.8 oz (104.7 kg)  06/10/22 231 lb 3.2 oz (104.9 kg)  03/01/22 232 lb (105.2 kg)  11/26/21 230 lb 8 oz (104.6 kg)  08/26/21 230 lb (104.3 kg)     Physical Exam Vitals and nursing note reviewed.  Constitutional:      General: He is not in acute distress.    Appearance: He is well-developed.  Cardiovascular:     Rate and Rhythm: Normal rate and regular rhythm.  Pulmonary:     Effort: Pulmonary effort is normal.     Breath sounds: Normal breath sounds.  Skin:    General: Skin is warm and dry.  Neurological:     Mental Status: He is alert and oriented to person, place, and time.      Lab Results:  CBC    Component Value Date/Time   WBC 9.5 08/26/2021 0923   WBC 6.7 07/22/2017 0710   RBC 4.98 08/26/2021 0923   RBC 4.17 (L) 07/22/2017 0710   HGB 16.7 08/26/2021 0923   HCT 48.4 08/26/2021 0923   PLT 192 08/26/2021 0923   MCV 97 08/26/2021 0923   MCH 33.5 (H) 08/26/2021 0923   MCH 32.4 07/22/2017 0710   MCHC 34.5 08/26/2021 0923   MCHC 33.2 07/22/2017 0710   RDW 12.9 08/26/2021 0923   LYMPHSABS 3.0 02/06/2019 0956   MONOABS 0.7 07/21/2017 0456   EOSABS 0.4 02/06/2019 0956   BASOSABS 0.1 02/06/2019 0956    BMET    Component Value Date/Time   NA 138 08/26/2021 0923   K 4.7 08/26/2021 0923   CL 101 08/26/2021 0923   CO2 22 08/26/2021 0923   GLUCOSE 155 (H) 08/26/2021 0923   GLUCOSE 98 07/22/2017 0710   BUN 10 08/26/2021 0923   CREATININE 1.03 08/26/2021 0923   CREATININE 1.11 12/24/2016 1354   CALCIUM 9.3 08/26/2021 0923   GFRNONAA 90 03/24/2020 1213   GFRNONAA 79 12/24/2016 1354   GFRAA 104 03/24/2020 1213   GFRAA 92 12/24/2016 1354     Assessment & Plan:   DM type 2, controlled, with complication (HCC) - POCT glycosylated hemoglobin (Hb A1C) - Microalbumin / creatinine urine ratio - metFORMIN (GLUCOPHAGE-XR) 500 MG 24 hr tablet; Take 1 tablet (500 mg total) by mouth daily with breakfast.  Dispense:  60 tablet; Refill: 2 - CBC - Comprehensive metabolic panel   2. Lipid screening  - Lipid Panel   4. Right sided weakness  - Ambulatory referral to Occupational Therapy    Follow up:  Follow up in 3 months     Ivonne Andrew, NP 09/09/2022

## 2022-09-10 ENCOUNTER — Other Ambulatory Visit: Payer: Self-pay

## 2022-09-10 LAB — CBC
Hematocrit: 47.2 % (ref 37.5–51.0)
Hemoglobin: 15.4 g/dL (ref 13.0–17.7)
MCH: 33.1 pg — ABNORMAL HIGH (ref 26.6–33.0)
MCHC: 32.6 g/dL (ref 31.5–35.7)
MCV: 102 fL — ABNORMAL HIGH (ref 79–97)
Platelets: 187 10*3/uL (ref 150–450)
RBC: 4.65 x10E6/uL (ref 4.14–5.80)
RDW: 13.2 % (ref 11.6–15.4)
WBC: 8.2 10*3/uL (ref 3.4–10.8)

## 2022-09-10 LAB — COMPREHENSIVE METABOLIC PANEL
ALT: 26 IU/L (ref 0–44)
AST: 12 IU/L (ref 0–40)
Albumin/Globulin Ratio: 1.8 (ref 1.2–2.2)
Albumin: 4.2 g/dL (ref 3.8–4.9)
Alkaline Phosphatase: 118 IU/L (ref 44–121)
BUN/Creatinine Ratio: 17 (ref 9–20)
BUN: 15 mg/dL (ref 6–24)
Bilirubin Total: 0.4 mg/dL (ref 0.0–1.2)
CO2: 22 mmol/L (ref 20–29)
Calcium: 8.9 mg/dL (ref 8.7–10.2)
Chloride: 101 mmol/L (ref 96–106)
Creatinine, Ser: 0.86 mg/dL (ref 0.76–1.27)
Globulin, Total: 2.3 g/dL (ref 1.5–4.5)
Glucose: 233 mg/dL — ABNORMAL HIGH (ref 70–99)
Potassium: 4.5 mmol/L (ref 3.5–5.2)
Sodium: 135 mmol/L (ref 134–144)
Total Protein: 6.5 g/dL (ref 6.0–8.5)
eGFR: 104 mL/min/{1.73_m2} (ref >59)

## 2022-09-10 LAB — LIPID PANEL
Chol/HDL Ratio: 6.5 ratio — ABNORMAL HIGH (ref 0.0–5.0)
Cholesterol, Total: 195 mg/dL (ref 100–199)
HDL: 30 mg/dL — ABNORMAL LOW (ref >39)
LDL Chol Calc (NIH): 146 mg/dL — ABNORMAL HIGH (ref 0–99)
Triglycerides: 105 mg/dL (ref 0–149)
VLDL Cholesterol Cal: 19 mg/dL (ref 5–40)

## 2022-09-10 LAB — MICROALBUMIN / CREATININE URINE RATIO
Creatinine, Urine: 78.3 mg/dL
Microalb/Creat Ratio: <4 mg/g creat (ref 0–29)
Microalbumin, Urine: <3 ug/mL

## 2022-09-15 ENCOUNTER — Encounter (HOSPITAL_COMMUNITY): Payer: Medicaid Other | Admitting: Physical Therapy

## 2022-09-15 ENCOUNTER — Other Ambulatory Visit: Payer: Self-pay

## 2022-09-15 MED ORDER — OXYCODONE-ACETAMINOPHEN 5-325 MG PO TABS: 1.0000 | ORAL_TABLET | ORAL | 0 refills | Status: AC | PRN

## 2022-09-17 ENCOUNTER — Encounter (HOSPITAL_COMMUNITY): Payer: Medicaid Other

## 2022-09-20 ENCOUNTER — Other Ambulatory Visit: Payer: Self-pay

## 2022-09-20 ENCOUNTER — Other Ambulatory Visit: Payer: Self-pay | Admitting: Nurse Practitioner

## 2022-09-20 DIAGNOSIS — I6302 Cerebral infarction due to thrombosis of basilar artery: Secondary | ICD-10-CM

## 2022-09-20 MED ORDER — CLOPIDOGREL BISULFATE 75 MG PO TABS
75.0000 mg | ORAL_TABLET | Freq: Every day | ORAL | 2 refills | Status: DC
  Filled 2022-09-20: qty 30, 30d supply, fill #0
  Filled 2022-10-22 – 2022-12-07 (×2): qty 30, 30d supply, fill #1
  Filled 2023-01-21: qty 30, 30d supply, fill #2

## 2022-09-21 ENCOUNTER — Ambulatory Visit (HOSPITAL_COMMUNITY): Payer: Medicaid Other | Attending: Nurse Practitioner

## 2022-09-21 DIAGNOSIS — R531 Weakness: Secondary | ICD-10-CM | POA: Insufficient documentation

## 2022-09-21 DIAGNOSIS — R278 Other lack of coordination: Secondary | ICD-10-CM | POA: Diagnosis not present

## 2022-09-21 DIAGNOSIS — R262 Difficulty in walking, not elsewhere classified: Secondary | ICD-10-CM | POA: Insufficient documentation

## 2022-09-21 DIAGNOSIS — R29818 Other symptoms and signs involving the nervous system: Secondary | ICD-10-CM | POA: Insufficient documentation
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
## 2022-09-22 ENCOUNTER — Ambulatory Visit: Payer: Self-pay | Admitting: Surgery

## 2022-09-22 DIAGNOSIS — L7682 Other postprocedural complications of skin and subcutaneous tissue: Secondary | ICD-10-CM | POA: Diagnosis not present

## 2022-09-24 ENCOUNTER — Ambulatory Visit (HOSPITAL_COMMUNITY): Payer: Medicaid Other | Admitting: Occupational Therapy

## 2022-09-24 ENCOUNTER — Encounter (HOSPITAL_COMMUNITY): Payer: Self-pay | Admitting: Occupational Therapy

## 2022-09-24 ENCOUNTER — Ambulatory Visit (HOSPITAL_COMMUNITY): Payer: Medicaid Other

## 2022-09-24 DIAGNOSIS — R262 Difficulty in walking, not elsewhere classified: Secondary | ICD-10-CM | POA: Diagnosis not present

## 2022-09-24 DIAGNOSIS — R531 Weakness: Secondary | ICD-10-CM

## 2022-09-24 DIAGNOSIS — R29818 Other symptoms and signs involving the nervous system: Secondary | ICD-10-CM | POA: Diagnosis not present

## 2022-09-24 DIAGNOSIS — R278 Other lack of coordination: Secondary | ICD-10-CM | POA: Diagnosis not present

## 2022-09-24 NOTE — Therapy (Signed)
OUTPATIENT OCCUPATIONAL THERAPY NEURO EVALUATION  Patient Name: Aaron Kennedy MRN: [id number] DOB:January 31, 1971, 52 y.o., male Today's Date: 09/24/2022  PCP: Ivonne Andrew, NP REFERRING PROVIDER: Ivonne Andrew, NP  END OF SESSION:  OT End of Session - 09/24/22 1526     Visit Number 1    Number of Visits 9    Date for OT Re-Evaluation 10/29/22    Authorization Type [employer]    Authorization Time Period Requesting 8 visits    OT Start Time 0732    OT Stop Time 0824    OT Time Calculation (min) 52 min    Activity Tolerance Patient tolerated treatment well    Behavior During Therapy WFL for tasks assessed/performed             Past Medical History:  Diagnosis Date   Anxiety    Chronic pain of right knee    DM type 2, controlled, with complication (HCC) 08/26/2021   Hypertension    Insomnia 01/2019   Stroke [facility])    Past Surgical History:  Procedure Laterality Date   HERNIA REPAIR     INCISION AND DRAINAGE ABSCESS Left 07/21/2017   Procedure: INCISION AND DRAINAGE INGUINAL HERNIA ABSCESS;  Surgeon: Emelia Loron, MD;  Location: [facility] OR;  Service: General;  Laterality: Left;   INGUINAL HERNIA REPAIR Bilateral 05/07/2017   Procedure: LAPAROSCOPIC RIGHT  INGUINAL HERNIA REPAIR AND  OPEN LEFT   INGUINAL EXPLORATION;  Surgeon: Axel Filler, MD;  Location: [facility] OR;  Service: General;  Laterality: Bilateral;   INSERTION OF MESH Bilateral 05/07/2017   Procedure: INSERTION OF MESH;  Surgeon: Axel Filler, MD;  Location: [facility] OR;  Service: General;  Laterality: Bilateral;   LEG SURGERY     Patient Active Problem List   Diagnosis Date Noted   Skin wound from surgical incision 06/10/2022   DM type 2, controlled, with complication (HCC) 08/26/2021   Insomnia 06/23/2019   Anxiety 06/23/2019   Soft tissue abscess of inguinal region 07/21/2017   Abscess 06/04/2017   Recurrent inguinal hernia 05/07/2017   S/P hernia repair 05/07/2017   Chronic pain of right knee  04/22/2016   PAD (peripheral artery disease) (HCC) 05/02/2015   Stroke (cerebrum) (HCC) 04/08/2015   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   CVA (cerebral vascular accident) (HCC) 04/03/2015   Seizures (HCC) 04/03/2015   Stroke (HCC) 03/22/2015   HTN (hypertension) 03/22/2015   HLD (hyperlipidemia) 03/22/2015   Prediabetes 03/22/2015   Tobacco use disorder 03/22/2015   [name] (transient ischemic attack) 03/18/2015   History of fracture of ankle    Right ankle pain     ONSET DATE: 03/2015  REFERRING DIAG: L CVA - R sided weakness  THERAPY DIAG:  Other lack of coordination  Other symptoms and signs involving the nervous system  Rationale for Evaluation and Treatment: Rehabilitation  SUBJECTIVE:   SUBJECTIVE STATEMENT: "It's the backside of my body on the R side" Pt accompanied by: self  PERTINENT HISTORY: Has been weak on the right side since stroke in 2016; did not have therapy after stroke; continued weakness right side; "they wouldn't give me therapy because I didn't have insurance" Reports some numbness and tingling posterior aspects of right side; reports some drool issues and stuttering when I get upset or anxious.   PRECAUTIONS: None  WEIGHT BEARING RESTRICTIONS: No  PAIN: Are you having pain? No  FALLS: Has patient fallen in last 6 months? No  LIVING ENVIRONMENT: Lives with: lives with their partner  Lives in: House/apartment  PLOF: Independent  PATIENT GOALS: To improve strength and mobility  OBJECTIVE:   HAND DOMINANCE: Right  ADLs: Overall ADLs: Pt has difficulty with fine motor tasks such as tying shoes, shaving, putting on a belt. Due to weakness, he is unable to lift and carrying items during cooking/cleaning/yard work.  MOBILITY STATUS: Independent  ACTIVITY TOLERANCE: Activity tolerance: Good  FUNCTIONAL OUTCOME MEASURES: Quick Dash: 54.55  UPPER EXTREMITY ROM:    Active ROM  Right eval  Shoulder flexion 124   Shoulder abduction 117  Shoulder internal rotation 90  Shoulder external rotation 49  (Blank rows = not tested)  UPPER EXTREMITY MMT:     MMT Right eval  Shoulder flexion 4/5  Shoulder abduction 4/5  Shoulder adduction 5/5  Shoulder extension 5/5  Shoulder internal rotation 5/5  Shoulder external rotation 4+/5  Elbow flexion 5/5  Elbow extension 4+/5  Wrist flexion 5/5  Wrist extension 4/5  Wrist ulnar deviation 5/5  Wrist radial deviation 4+/5  Wrist pronation 4-/5  Wrist supination 4+/5  (Blank rows = not tested)  HAND FUNCTION: Grip strength: Right: 59 lbs; Left: 125 lbs, Lateral pinch: Right: 21 lbs, Left: 25 lbs, and 3 point pinch: Right: 11 lbs, Left: 22 lbs  COORDINATION: 9 Hole Peg test: Right: 52.82 sec; Left: 30.61 sec  SENSATION: Light touch: Impaired  Proprioception: Impaired   EDEMA: Mild edema noted in the R hand  VISION: Subjective report: Blurred vision Baseline vision: No visual deficits Visual history:  no deficits  VISION ASSESSMENT: Not tested  Patient has difficulty with following activities due to following visual impairments: Reading, especially at night  OBSERVATIONS: Poor motor control, slow movements   TODAY'S TREATMENT:                                                                                                                              DATE: 09/24/22: Evaluation Only   PATIENT EDUCATION: Education details: Continuing to work on functional movements with ADL's Person educated: Patient Education method: Explanation Education comprehension: verbalized understanding and returned demonstration  HOME EXERCISE PROGRAM: Work on functional movements with ADL's.    GOALS: Goals reviewed with patient? Yes  SHORT TERM GOALS: Target date: 10/29/22  Pt will be educated on HEP to improve ability to actively use RUE during functional task completion.   Goal status: INITIAL  2.   Pt will increase A/ROM of RUE to [facility] to improve  ability to reach overhead and behind back during dressing and bathing tasks.   Goal status: INITIAL  3.  Pt will increase strength in RUE to 4+/5 to improve ability to complete lifting tasks required during cooking and cleaning tasks.    Goal status: INITIAL  4.  Pt will increase Right grip strength by 15# and pinch strength by 5# to improve ability to grasp and hold pots and pans during meal preparation.   Goal status: INITIAL  5.  Pt will increase  fine motor coordination in RUE by completing 9 hole peg test in 35" or less to improve ability to perform dressing tasks including operating buttons or zippers.  Goal status: INITIAL   ASSESSMENT:  CLINICAL IMPRESSION: Patient is a 52 y.o. male who was seen today for occupational therapy evaluation for R sided weakness following CVA 8 years ago. He demonstrates decreased strength and coordination.   PERFORMANCE DEFICITS: in functional skills including ADLs, IADLs, coordination, dexterity, sensation, ROM, strength, Fine motor control, Gross motor control, body mechanics, and UE functional use.  IMPAIRMENTS: are limiting patient from ADLs, IADLs, rest and sleep, work, leisure, and social participation.   CO-MORBIDITIES: has no other co-morbidities that affects occupational performance. Patient will benefit from skilled OT to address above impairments and improve overall function.  MODIFICATION OR ASSISTANCE TO COMPLETE EVALUATION: No modification of tasks or assist necessary to complete an evaluation.  OT OCCUPATIONAL PROFILE AND HISTORY: Problem focused assessment: Including review of records relating to presenting problem.  CLINICAL DECISION MAKING: LOW - limited treatment options, no task modification necessary  REHAB POTENTIAL: Good  EVALUATION COMPLEXITY: Low    PLAN:  OT FREQUENCY: 2x/week  OT DURATION: 4 weeks  PLANNED INTERVENTIONS: self care/ADL training, therapeutic exercise, therapeutic activity, neuromuscular  re-education, manual therapy, passive range of motion, functional mobility training, electrical stimulation, paraffin, moist heat, patient/family education, coping strategies training, and DME and/or AE instructions  RECOMMENDED OTHER SERVICES: PT  CONSULTED AND AGREED WITH PLAN OF CARE: Patient  PLAN FOR NEXT SESSION: A/ROM, shoulder strengthening, wrist strengthening, grip and pinch strengthening, coordination tasks.    Trish Mage, OTR/L [facility] [phone] Kennyth Arnold, OT 09/24/2022, 3:27 PM
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
## 2022-09-30 ENCOUNTER — Encounter: Payer: Medicaid Other | Admitting: Podiatry

## 2022-10-08 ENCOUNTER — Encounter (HOSPITAL_COMMUNITY): Payer: Self-pay | Admitting: Occupational Therapy

## 2022-10-08 ENCOUNTER — Ambulatory Visit (HOSPITAL_COMMUNITY): Payer: Medicaid Other | Admitting: Occupational Therapy

## 2022-10-08 ENCOUNTER — Ambulatory Visit (INDEPENDENT_AMBULATORY_CARE_PROVIDER_SITE_OTHER): Payer: Medicaid Other | Admitting: Podiatry

## 2022-10-08 DIAGNOSIS — R278 Other lack of coordination: Secondary | ICD-10-CM | POA: Diagnosis not present

## 2022-10-08 DIAGNOSIS — M7751 Other enthesopathy of right foot: Secondary | ICD-10-CM

## 2022-10-08 DIAGNOSIS — R531 Weakness: Secondary | ICD-10-CM | POA: Diagnosis not present

## 2022-10-08 DIAGNOSIS — R262 Difficulty in walking, not elsewhere classified: Secondary | ICD-10-CM | POA: Diagnosis not present

## 2022-10-08 DIAGNOSIS — M19071 Primary osteoarthritis, right ankle and foot: Secondary | ICD-10-CM

## 2022-10-08 DIAGNOSIS — Z9889 Other specified postprocedural states: Secondary | ICD-10-CM

## 2022-10-08 DIAGNOSIS — R29818 Other symptoms and signs involving the nervous system: Secondary | ICD-10-CM | POA: Diagnosis not present

## 2022-10-08 NOTE — Patient Instructions (Signed)
Repeat all exercises 10-15 times, 1-2 times per day.  1) Shoulder Protraction    Begin with elbows by your side, slowly "punch" straight out in front of you.      2) Shoulder Flexion  Supine:     Standing:         Begin with arms at your side with thumbs pointed up, slowly raise both arms up and forward towards overhead.               3) Horizontal abduction/adduction  Supine:   Standing:           Begin with arms straight out in front of you, bring out to the side in at "T" shape. Keep arms straight entire time.                 4) Internal & External Rotation   Supine:     Standing:     Stand with elbows at the side and elbows bent 90 degrees. Move your forearms away from your body, then bring back inward toward the body.     5) Shoulder Abduction  Supine:     Standing:       Lying on your back begin with your arms flat on the table next to your side. Slowly move your arms out to the side so that they go overhead, in a jumping jack or snow angel movement.     Strengthening Exercises  1) WRIST EXTENSION CURLS - TABLE  Hold a small free weight, rest your forearm on a table and bend your wrist up and down with your palm face down as shown.      2) WRIST FLEXION CURLS - TABLE  Hold a small free weight, rest your forearm on a table and bend your wrist up and down with your palm face up as shown.     3) FREE WEIGHT RADIAL/ULNAR DEVIATION - TABLE  Hold a small free weight, rest your forearm on a table and bend your wrist up and down with your palm facing towards the side as shown.     4) Pronation  Forearm supported on table with wrist in neutral position. Using a weight, roll wrist so that palm faces downward. Hold for 2 seconds and return to starting position.     5) Supination  Forearm supported on table with wrist in neutral position. Using a weight, roll wrist so that palm is now facing upward. Hold for 2  seconds and return to starting position.      *Complete exercises using 4-5 pound weight, 10-15 times each, 2-3 times per day*

## 2022-10-08 NOTE — Therapy (Signed)
OUTPATIENT OCCUPATIONAL THERAPY NEURO TREATMENT NOTE  Patient Name: Aaron Kennedy MRN: [id number] DOB:December 10, 1970, 52 y.o., male Today's Date: 10/08/2022  PCP: Ivonne Andrew, NP REFERRING PROVIDER: Ivonne Andrew, NP  END OF SESSION:  OT End of Session - 10/08/22 0734     Visit Number 2    Number of Visits 9    Date for OT Re-Evaluation 10/29/22    Authorization Type [employer]    Authorization Time Period 6 visits (09/24/22-11/22/22)    Authorization - Visit Number 1    Authorization - Number of Visits 6    Activity Tolerance Patient tolerated treatment well    Behavior During Therapy [facility] for tasks assessed/performed             Past Medical History:  Diagnosis Date   Anxiety    Chronic pain of right knee    DM type 2, controlled, with complication (HCC) 08/26/2021   Hypertension    Insomnia 01/2019   Stroke [facility])    Past Surgical History:  Procedure Laterality Date   HERNIA REPAIR     INCISION AND DRAINAGE ABSCESS Left 07/21/2017   Procedure: INCISION AND DRAINAGE INGUINAL HERNIA ABSCESS;  Surgeon: Emelia Loron, MD;  Location: [facility] OR;  Service: General;  Laterality: Left;   INGUINAL HERNIA REPAIR Bilateral 05/07/2017   Procedure: LAPAROSCOPIC RIGHT  INGUINAL HERNIA REPAIR AND  OPEN LEFT   INGUINAL EXPLORATION;  Surgeon: Axel Filler, MD;  Location: [facility] OR;  Service: General;  Laterality: Bilateral;   INSERTION OF MESH Bilateral 05/07/2017   Procedure: INSERTION OF MESH;  Surgeon: Axel Filler, MD;  Location: [facility] OR;  Service: General;  Laterality: Bilateral;   LEG SURGERY     Patient Active Problem List   Diagnosis Date Noted   Skin wound from surgical incision 06/10/2022   DM type 2, controlled, with complication (HCC) 08/26/2021   Insomnia 06/23/2019   Anxiety 06/23/2019   Soft tissue abscess of inguinal region 07/21/2017   Abscess 06/04/2017   Recurrent inguinal hernia 05/07/2017   S/P hernia repair 05/07/2017   Chronic pain of right knee  04/22/2016   PAD (peripheral artery disease) (HCC) 05/02/2015   Stroke (cerebrum) (HCC) 04/08/2015   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   CVA (cerebral vascular accident) (HCC) 04/03/2015   Seizures (HCC) 04/03/2015   Stroke (HCC) 03/22/2015   HTN (hypertension) 03/22/2015   HLD (hyperlipidemia) 03/22/2015   Prediabetes 03/22/2015   Tobacco use disorder 03/22/2015   [name] (transient ischemic attack) 03/18/2015   History of fracture of ankle    Right ankle pain     ONSET DATE: 03/2015  REFERRING DIAG: L CVA - R sided weakness  THERAPY DIAG:  Other lack of coordination  Other symptoms and signs involving the nervous system  Rationale for Evaluation and Treatment: Rehabilitation  SUBJECTIVE:   SUBJECTIVE STATEMENT: "I just can't do much of anything" Pt accompanied by: self  PERTINENT HISTORY: Has been weak on the right side since stroke in 2016; did not have therapy after stroke; continued weakness right side; "they wouldn't give me therapy because I didn't have insurance" Reports some numbness and tingling posterior aspects of right side; reports some drool issues and stuttering when I get upset or anxious.   PRECAUTIONS: None  WEIGHT BEARING RESTRICTIONS: No  PAIN: Are you having pain? No  FALLS: Has patient fallen in last 6 months? No  LIVING ENVIRONMENT: Lives with: lives with their partner Lives in: House/apartment  PLOF: Independent  PATIENT  GOALS: To improve strength and mobility  OBJECTIVE:   HAND DOMINANCE: Right  ADLs: Overall ADLs: Pt has difficulty with fine motor tasks such as tying shoes, shaving, putting on a belt. Due to weakness, he is unable to lift and carrying items during cooking/cleaning/yard work.  MOBILITY STATUS: Independent  ACTIVITY TOLERANCE: Activity tolerance: Good  FUNCTIONAL OUTCOME MEASURES: Quick Dash: 54.55  UPPER EXTREMITY ROM:    Active ROM  Right eval  Shoulder flexion 124  Shoulder  abduction 117  Shoulder internal rotation 90  Shoulder external rotation 49  (Blank rows = not tested)  UPPER EXTREMITY MMT:     MMT Right eval  Shoulder flexion 4/5  Shoulder abduction 4/5  Shoulder adduction 5/5  Shoulder extension 5/5  Shoulder internal rotation 5/5  Shoulder external rotation 4+/5  Elbow flexion 5/5  Elbow extension 4+/5  Wrist flexion 5/5  Wrist extension 4/5  Wrist ulnar deviation 5/5  Wrist radial deviation 4+/5  Wrist pronation 4-/5  Wrist supination 4+/5  (Blank rows = not tested)  HAND FUNCTION: Grip strength: Right: 59 lbs; Left: 125 lbs, Lateral pinch: Right: 21 lbs, Left: 25 lbs, and 3 point pinch: Right: 11 lbs, Left: 22 lbs  COORDINATION: 9 Hole Peg test: Right: 52.82 sec; Left: 30.61 sec  SENSATION: Light touch: Impaired  Proprioception: Impaired   EDEMA: Mild edema noted in the R hand  VISION: Subjective report: Blurred vision Baseline vision: No visual deficits Visual history:  no deficits  VISION ASSESSMENT: Not tested  Patient has difficulty with following activities due to following visual impairments: Reading, especially at night  OBSERVATIONS: Poor motor control, slow movements   TODAY'S TREATMENT:                                                                                                                              DATE:   10/08/22 -A/ROM: shoulder flexion, abduction, protraction, horizontal abduction, er/IR, wrist flexion/extension, supination/pronation, ulnar/radial deviation, x10 -Shoulder Strengthening: 4lb dumbbellls, flexion, abduction, protraction, horizontal abduction, er/IR, x10 -Wrist Strengthening: 4lb dumbbells, flexion, extension, ulnar/radial deviation, supination/pronation, x10 -Theraputty: red putty, roll into a ball, flatten into a pancake, roll into a log, tripod pinch x20, lateral pinch x20, roll into a ball, squeeze x10    PATIENT EDUCATION: Education details: Continuing to work on  functional movements with ADL's Person educated: Patient Education method: Explanation Education comprehension: verbalized understanding and returned demonstration  HOME EXERCISE PROGRAM: Work on functional movements with ADL's.    GOALS: Goals reviewed with patient? Yes  SHORT TERM GOALS: Target date: 10/29/22  Pt will be educated on HEP to improve ability to actively use RUE during functional task completion.   Goal status: IN PROGRESS  2.   Pt will increase A/ROM of RUE to [facility] to improve ability to reach overhead and behind back during dressing and bathing tasks.   Goal status: IN PROGRESS  3.  Pt will increase strength in RUE to 4+/5 to improve  ability to complete lifting tasks required during cooking and cleaning tasks.    Goal status: IN PROGRESS  4.  Pt will increase Right grip strength by 15# and pinch strength by 5# to improve ability to grasp and hold pots and pans during meal preparation.   Goal status: IN PROGRESS  5.  Pt will increase fine motor coordination in RUE by completing 9 hole peg test in 35" or less to improve ability to perform dressing tasks including operating buttons or zippers.  Goal status: IN PROGRESS   ASSESSMENT:  CLINICAL IMPRESSION: This session pt working on slow and controlled movements, both with shoulder strengthening and wrist strengthening. He demonstrates a tendency of quick movements with poor movement pattern, when cued to slow down, his movement is more controlled, despite being difficult. Verbal and visual cuing provided for positioning and technique with all exercises.   PERFORMANCE DEFICITS: in functional skills including ADLs, IADLs, coordination, dexterity, sensation, ROM, strength, Fine motor control, Gross motor control, body mechanics, and UE functional use.    PLAN:  OT FREQUENCY: 2x/week  OT DURATION: 4 weeks  PLANNED INTERVENTIONS: self care/ADL training, therapeutic exercise, therapeutic activity, neuromuscular  re-education, manual therapy, passive range of motion, functional mobility training, electrical stimulation, paraffin, moist heat, patient/family education, coping strategies training, and DME and/or AE instructions  RECOMMENDED OTHER SERVICES: PT  CONSULTED AND AGREED WITH PLAN OF CARE: Patient  PLAN FOR NEXT SESSION: A/ROM, shoulder strengthening, wrist strengthening, grip and pinch strengthening, coordination tasks.    Trish Mage, OTR/L [facility] [phone] Kennyth Arnold, OT 10/08/2022, 7:36 AM

## 2022-10-08 NOTE — Progress Notes (Signed)
  Subjective:  Patient ID: Aaron Kennedy, male    DOB: 08-28-1970,  MRN: [id number]  Chief Complaint  Patient presents with   Routine Post Op    POV #4 DOS 07/14/22 --- RIGHT FOOT EXOSTECTOMY FROM MIDFOOT  Patient states great toe joint has been having some pain but it has not been constant , states he is glad that he can move his toes     DOS: 07/14/22 Procedure: Midfoot exostectomy of right first tarsometatarsal joint area right foot  52 y.o. male returns for post-op check.  Patient is approximately 12-week status post right midfoot exostectomy.  He is now weightbearing in regular shoes.  He states that the burning sensation has decreased in his foot and he is feeling better he has full motion in his toes and says the burning pain has decreased.  Review of Systems: Negative except as noted in the HPI. Denies N/V/F/Ch.   Objective:  There were no vitals filed for this visit. There is no height or weight on file to calculate BMI. Constitutional Well developed. Well nourished.  Vascular Foot warm and well perfused. Capillary refill normal to all digits.  Calf is soft and supple, no posterior calf or knee pain, negative Homans' sign  Neurologic Normal speech. Oriented to person, place, and time. Epicritic sensation to light touch grossly present bilaterally.  Dermatologic Skin well-healed with no evidence of open wound at this time   Orthopedic: Nontender to palpation noted about the surgical site improved from prior.   Multiple view plain film radiographs: Deferred at this visit Assessment:   1. Post-operative state   2. Bone spur of right foot   3. Arthritis of right midfoot       Plan:  Patient was evaluated and treated and all questions answered.  S/p foot surgery right midfoot exostectomy -Progressing as expected post-operatively.   -Patient is now fully healed without any issue or concern for ongoing nerve pain at the surgical site -XR: Deferred at this visit -WB  Status: Weightbearing as tolerated post in regular shoes -Medications: Tylenol as needed for pain -No need for further dressings -Overall the patient is now fully improved postoperatively he is satisfied with the surgical result there is normal prominence of the midfoot and he is back to doing more of his activities.  No further restrictions he is back to full clearance for activity as tolerated  # Posterior tibial tendinitis and deltoid ligament pain on the right ankle -Reports much improvement following prior steroid injection continue to monitor  Return if symptoms worsen or fail to improve.         Corinna Gab, DPM [facility] / [facility]

## 2022-10-15 ENCOUNTER — Ambulatory Visit (HOSPITAL_COMMUNITY): Payer: Medicaid Other | Admitting: Occupational Therapy

## 2022-10-15 DIAGNOSIS — R262 Difficulty in walking, not elsewhere classified: Secondary | ICD-10-CM | POA: Diagnosis not present

## 2022-10-15 DIAGNOSIS — R531 Weakness: Secondary | ICD-10-CM | POA: Diagnosis not present

## 2022-10-15 DIAGNOSIS — R29818 Other symptoms and signs involving the nervous system: Secondary | ICD-10-CM | POA: Diagnosis not present

## 2022-10-15 DIAGNOSIS — R278 Other lack of coordination: Secondary | ICD-10-CM

## 2022-10-15 NOTE — Patient Instructions (Signed)
Strengthening Exercises  1) WRIST EXTENSION CURLS - TABLE  Hold a small free weight, rest your forearm on a table and bend your wrist up and down with your palm face down as shown.      2) WRIST FLEXION CURLS - TABLE  Hold a small free weight, rest your forearm on a table and bend your wrist up and down with your palm face up as shown.     3) FREE WEIGHT RADIAL/ULNAR DEVIATION - TABLE  Hold a small free weight, rest your forearm on a table and bend your wrist up and down with your palm facing towards the side as shown.     4) Pronation  Forearm supported on table with wrist in neutral position. Using a weight, roll wrist so that palm faces downward. Hold for 2 seconds and return to starting position.     5) Supination  Forearm supported on table with wrist in neutral position. Using a weight, roll wrist so that palm is now facing upward. Hold for 2 seconds and return to starting position.      *Complete exercises using ____ pound weight, ____times each, ____times per day*  

## 2022-10-15 NOTE — Therapy (Signed)
OUTPATIENT OCCUPATIONAL THERAPY NEURO TREATMENT NOTE  Patient Name: Aaron Kennedy MRN: [id number] DOB:09/12/1970, 52 y.o., male Today's Date: 10/15/2022  PCP: Ivonne Andrew, NP REFERRING PROVIDER: Ivonne Andrew, NP  END OF SESSION:  OT End of Session - 10/15/22 1908     Visit Number 3    Number of Visits 9    Date for OT Re-Evaluation 10/29/22    Authorization Type [employer]    Authorization Time Period 6 visits (09/24/22-11/22/22)    Authorization - Visit Number 2    Authorization - Number of Visits 6    OT Start Time [id number]    OT Stop Time 0820    OT Time Calculation (min) 42 min    Activity Tolerance Patient tolerated treatment well    Behavior During Therapy WFL for tasks assessed/performed              Past Medical History:  Diagnosis Date   Anxiety    Chronic pain of right knee    DM type 2, controlled, with complication (HCC) 08/26/2021   Hypertension    Insomnia 01/2019   Stroke [facility])    Past Surgical History:  Procedure Laterality Date   HERNIA REPAIR     INCISION AND DRAINAGE ABSCESS Left 07/21/2017   Procedure: INCISION AND DRAINAGE INGUINAL HERNIA ABSCESS;  Surgeon: Emelia Loron, MD;  Location: [facility] OR;  Service: General;  Laterality: Left;   INGUINAL HERNIA REPAIR Bilateral 05/07/2017   Procedure: LAPAROSCOPIC RIGHT  INGUINAL HERNIA REPAIR AND  OPEN LEFT   INGUINAL EXPLORATION;  Surgeon: Axel Filler, MD;  Location: [facility] OR;  Service: General;  Laterality: Bilateral;   INSERTION OF MESH Bilateral 05/07/2017   Procedure: INSERTION OF MESH;  Surgeon: Axel Filler, MD;  Location: [facility] OR;  Service: General;  Laterality: Bilateral;   LEG SURGERY     Patient Active Problem List   Diagnosis Date Noted   Skin wound from surgical incision 06/10/2022   DM type 2, controlled, with complication (HCC) 08/26/2021   Insomnia 06/23/2019   Anxiety 06/23/2019   Soft tissue abscess of inguinal region 07/21/2017   Abscess 06/04/2017   Recurrent inguinal  hernia 05/07/2017   S/P hernia repair 05/07/2017   Chronic pain of right knee 04/22/2016   PAD (peripheral artery disease) (HCC) 05/02/2015   Stroke (cerebrum) (HCC) 04/08/2015   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   CVA (cerebral vascular accident) (HCC) 04/03/2015   Seizures (HCC) 04/03/2015   Stroke (HCC) 03/22/2015   HTN (hypertension) 03/22/2015   HLD (hyperlipidemia) 03/22/2015   Prediabetes 03/22/2015   Tobacco use disorder 03/22/2015   [name] (transient ischemic attack) 03/18/2015   History of fracture of ankle    Right ankle pain     ONSET DATE: 03/2015  REFERRING DIAG: L CVA - R sided weakness  THERAPY DIAG:  Other lack of coordination  Other symptoms and signs involving the nervous system  Rationale for Evaluation and Treatment: Rehabilitation  SUBJECTIVE:   SUBJECTIVE STATEMENT: "I just feel weak" Pt accompanied by: self  PERTINENT HISTORY: Has been weak on the right side since stroke in 2016; did not have therapy after stroke; continued weakness right side; "they wouldn't give me therapy because I didn't have insurance" Reports some numbness and tingling posterior aspects of right side; reports some drool issues and stuttering when I get upset or anxious.   PRECAUTIONS: None  WEIGHT BEARING RESTRICTIONS: No  PAIN: Are you having pain? No  FALLS: Has patient fallen in  last 6 months? No  LIVING ENVIRONMENT: Lives with: lives with their partner Lives in: House/apartment  PLOF: Independent  PATIENT GOALS: To improve strength and mobility  OBJECTIVE:   HAND DOMINANCE: Right  ADLs: Overall ADLs: Pt has difficulty with fine motor tasks such as tying shoes, shaving, putting on a belt. Due to weakness, he is unable to lift and carrying items during cooking/cleaning/yard work.  MOBILITY STATUS: Independent  ACTIVITY TOLERANCE: Activity tolerance: Good  FUNCTIONAL OUTCOME MEASURES: Quick Dash: 54.55  UPPER EXTREMITY ROM:     Active ROM  Right eval  Shoulder flexion 124  Shoulder abduction 117  Shoulder internal rotation 90  Shoulder external rotation 49  (Blank rows = not tested)  UPPER EXTREMITY MMT:     MMT Right eval  Shoulder flexion 4/5  Shoulder abduction 4/5  Shoulder adduction 5/5  Shoulder extension 5/5  Shoulder internal rotation 5/5  Shoulder external rotation 4+/5  Elbow flexion 5/5  Elbow extension 4+/5  Wrist flexion 5/5  Wrist extension 4/5  Wrist ulnar deviation 5/5  Wrist radial deviation 4+/5  Wrist pronation 4-/5  Wrist supination 4+/5  (Blank rows = not tested)  HAND FUNCTION: Grip strength: Right: 59 lbs; Left: 125 lbs, Lateral pinch: Right: 21 lbs, Left: 25 lbs, and 3 point pinch: Right: 11 lbs, Left: 22 lbs  COORDINATION: 9 Hole Peg test: Right: 52.82 sec; Left: 30.61 sec  SENSATION: Light touch: Impaired  Proprioception: Impaired   EDEMA: Mild edema noted in the R hand  VISION: Subjective report: Blurred vision Baseline vision: No visual deficits Visual history:  no deficits  VISION ASSESSMENT: Not tested  Patient has difficulty with following activities due to following visual impairments: Reading, especially at night  OBSERVATIONS: Poor motor control, slow movements   TODAY'S TREATMENT:                                                                                                                              DATE:   10/15/22 -Shoulder Strengthening: 4lb dumbbellls, flexion, abduction, protraction, horizontal abduction, er/IR, x10 -Wrist Strengthening: 4lb dumbbells, flexion, extension, ulnar/radial deviation, supination/pronation, x15 -Scapular Strengthening: blue band, extension, retraction, protraction, rows, x15 -Shoulder strengthening: green band, er, IR, red band horizontal abduction and flexion, yellow band, abduction, x10 -Grooved peg board -Pinch strengthening: green, blue, black resistance clips, stacking 6 cubes x2  10/08/22 -A/ROM:  shoulder flexion, abduction, protraction, horizontal abduction, er/IR, wrist flexion/extension, supination/pronation, ulnar/radial deviation, x10 -Shoulder Strengthening: 4lb dumbbellls, flexion, abduction, protraction, horizontal abduction, er/IR, x10 -Wrist Strengthening: 4lb dumbbells, flexion, extension, ulnar/radial deviation, supination/pronation, x10 -Theraputty: red putty, roll into a ball, flatten into a pancake, roll into a log, tripod pinch x20, lateral pinch x20, roll into a ball, squeeze x10    PATIENT EDUCATION: Education details: Public relations account executive and wrist strengthening Person educated: Patient Education method: Explanation, Demonstration, and Handouts Education comprehension: verbalized understanding and returned demonstration  HOME EXERCISE PROGRAM: 6/21:Work on functional movements with ADL's.  6/28:  Shoulder Strengthening and wrist strengthening   GOALS: Goals reviewed with patient? Yes  SHORT TERM GOALS: Target date: 10/29/22  Pt will be educated on HEP to improve ability to actively use RUE during functional task completion.   Goal status: IN PROGRESS  2.   Pt will increase A/ROM of RUE to [facility] to improve ability to reach overhead and behind back during dressing and bathing tasks.   Goal status: IN PROGRESS  3.  Pt will increase strength in RUE to 4+/5 to improve ability to complete lifting tasks required during cooking and cleaning tasks.    Goal status: IN PROGRESS  4.  Pt will increase Right grip strength by 15# and pinch strength by 5# to improve ability to grasp and hold pots and pans during meal preparation.   Goal status: IN PROGRESS  5.  Pt will increase fine motor coordination in RUE by completing 9 hole peg test in 35" or less to improve ability to perform dressing tasks including operating buttons or zippers.  Goal status: IN PROGRESS   ASSESSMENT:  CLINICAL IMPRESSION: This session pt continuing to work on strengthening entire UE. OT  added shoulder strengthening and pinch strengthening this session, along with a coordination task. He fatigued with shoulder strengthening and required yellow band for abduction and red band for horizontal abduction. With the grooved peg board, he dropped multiple pegs and had difficulty holding 3 and placing them in the board 1 at a time. Verbal and visual cuing provided for positioning and technique throughout session.   PERFORMANCE DEFICITS: in functional skills including ADLs, IADLs, coordination, dexterity, sensation, ROM, strength, Fine motor control, Gross motor control, body mechanics, and UE functional use.    PLAN:  OT FREQUENCY: 2x/week  OT DURATION: 4 weeks  PLANNED INTERVENTIONS: self care/ADL training, therapeutic exercise, therapeutic activity, neuromuscular re-education, manual therapy, passive range of motion, functional mobility training, electrical stimulation, paraffin, moist heat, patient/family education, coping strategies training, and DME and/or AE instructions  RECOMMENDED OTHER SERVICES: PT  CONSULTED AND AGREED WITH PLAN OF CARE: Patient  PLAN FOR NEXT SESSION: A/ROM, shoulder strengthening, wrist strengthening, grip and pinch strengthening, coordination tasks.    Trish Mage, OTR/L [facility] [phone] Kennyth Arnold, OT 10/15/2022, 7:18 PM

## 2022-10-22 ENCOUNTER — Other Ambulatory Visit: Payer: Self-pay | Admitting: Nurse Practitioner

## 2022-10-22 ENCOUNTER — Other Ambulatory Visit: Payer: Self-pay

## 2022-10-22 ENCOUNTER — Telehealth: Payer: Self-pay | Admitting: Nurse Practitioner

## 2022-10-22 ENCOUNTER — Encounter (HOSPITAL_COMMUNITY): Payer: Medicaid Other | Admitting: Occupational Therapy

## 2022-10-22 ENCOUNTER — Telehealth (HOSPITAL_COMMUNITY): Payer: Self-pay | Admitting: Occupational Therapy

## 2022-10-22 DIAGNOSIS — G629 Polyneuropathy, unspecified: Secondary | ICD-10-CM

## 2022-10-22 DIAGNOSIS — I10 Essential (primary) hypertension: Secondary | ICD-10-CM

## 2022-10-22 MED ORDER — GABAPENTIN 600 MG PO TABS
600.0000 mg | ORAL_TABLET | Freq: Two times a day (BID) | ORAL | 2 refills | Status: DC
Start: 2022-10-22 — End: 2023-02-23
  Filled 2022-10-22 – 2022-11-17 (×2): qty 60, 30d supply, fill #0
  Filled 2022-12-21: qty 60, 30d supply, fill #1
  Filled 2023-01-21: qty 60, 30d supply, fill #2

## 2022-10-22 MED ORDER — LISINOPRIL-HYDROCHLOROTHIAZIDE 10-12.5 MG PO TABS
1.0000 | ORAL_TABLET | Freq: Every day | ORAL | 0 refills | Status: DC
Start: 2022-10-22 — End: 2022-10-22
  Filled 2022-10-22: qty 30, 30d supply, fill #0

## 2022-10-22 MED ORDER — METOPROLOL SUCCINATE ER 25 MG PO TB24
ORAL_TABLET | Freq: Every day | ORAL | 0 refills | Status: DC
Start: 2022-10-22 — End: 2022-11-17
  Filled 2022-10-22: qty 30, 30d supply, fill #0

## 2022-10-22 MED ORDER — LISINOPRIL-HYDROCHLOROTHIAZIDE 10-12.5 MG PO TABS
1.0000 | ORAL_TABLET | Freq: Every day | ORAL | 0 refills | Status: DC
Start: 2022-10-22 — End: 2022-11-17
  Filled 2022-10-22: qty 30, 30d supply, fill #0

## 2022-10-22 NOTE — Telephone Encounter (Signed)
Called pt re: no-show for 7/5 appt. Pt states he forgot, reminded of appt on 7/12 at 7:30.   Ezra Sites, OTR/L  [phone]

## 2022-10-22 NOTE — Telephone Encounter (Signed)
Caller & Relationship to patient:  MRN #  [id number]   Call Back Number:   Date of Last Office Visit: 10/22/2022     Date of Next Office Visit: 12/10/2022    Medication(s) to be Refilled: lisinopril metoprolol   Preferred Pharmacy:   ** Please notify patient to allow 48-72 hours to process** **Let patient know to contact pharmacy at the end of the day to make sure medication is ready. ** **If patient has not been seen in a year or longer, book an appointment **Advise to use MyChart for refill requests OR to contact their pharmacy

## 2022-10-29 ENCOUNTER — Ambulatory Visit (HOSPITAL_COMMUNITY): Payer: Medicaid Other | Attending: Nurse Practitioner | Admitting: Occupational Therapy

## 2022-10-29 ENCOUNTER — Other Ambulatory Visit: Payer: Self-pay

## 2022-10-29 ENCOUNTER — Encounter (HOSPITAL_COMMUNITY): Payer: Self-pay | Admitting: Occupational Therapy

## 2022-10-29 DIAGNOSIS — R278 Other lack of coordination: Secondary | ICD-10-CM | POA: Diagnosis not present

## 2022-10-29 DIAGNOSIS — R29818 Other symptoms and signs involving the nervous system: Secondary | ICD-10-CM | POA: Insufficient documentation

## 2022-10-29 NOTE — Patient Instructions (Signed)
Theraband strengthening: Complete 10-15X, 1-2X/day  1) Shoulder protraction  Anchor band in doorway, stand with back to door. Push your hand forward as much as you can to bringing your shoulder blades forward on your rib cage.      2) Shoulder horizontal abduction  Standing with a theraband anchored at chest height, begin with arm straight and some tension in the band. Move your arm out to your side (keeping straight the whole time). Bring the affected arm back to midline.     3) Shoulder Internal Rotation  While holding an elastic band at your side with your elbow bent, start with your hand away from your stomach, then pull the band towards your stomach. Keep your elbow near your side the entire time.     4) Shoulder External Rotation  While holding an elastic band at your side with your elbow bent, start with your hand near your stomach and then pull the band away. Keep your elbow at your side the entire time.     5) Shoulder flexion  While standing with back to the door, holding Theraband at hand level, raise arm in front of you.  Keep elbow straight through entire movement.      6) Shoulder abduction  While holding an elastic band at your side, draw up your arm to the side keeping your elbow straight.    1) (Home) Extension: Isometric / Bilateral Arm Retraction - Sitting   Facing anchor, hold hands and elbow at shoulder height, with elbow bent.  Pull arms back to squeeze shoulder blades together. Repeat 10-15 times. 1-3 times/day.   2) (Clinic) Extension / Flexion (Assist)   Face anchor, pull arms back, keeping elbow straight, and squeze shoulder blades together. Repeat 10-15 times. 1-3 times/day.   Copyright  [employer]. All rights reserved.   3) (Home) Retraction: Row - Bilateral (Anchor)   Facing anchor, arms reaching forward, pull hands toward stomach, keeping elbows bent and at your sides and pinching shoulder blades together. Repeat 10-15 times. 1-3  times/day.   Copyright  [employer]HI. All rights reserved.   

## 2022-10-29 NOTE — Therapy (Signed)
OUTPATIENT OCCUPATIONAL THERAPY NEURO TREATMENT NOTE and REASSESSMENT  Patient Name: Aaron Kennedy MRN: [id number] DOB:1971-01-16, 52 y.o., male Today's Date: 10/29/2022  PCP: Ivonne Andrew, NP REFERRING PROVIDER: Ivonne Andrew, NP  Progress Note Reporting Period 09/24/22 to 10/28/22  See note below for Objective Data and Assessment of Progress/Goals.    END OF SESSION:  OT End of Session - 10/29/22 0947     Visit Number 4    Number of Visits 9    Date for OT Re-Evaluation 11/19/22    Authorization Type [employer]    Authorization Time Period 6 visits (09/24/22-11/22/22)    Authorization - Visit Number 3    Authorization - Number of Visits 6    OT Start Time 0733    OT Stop Time 0819    OT Time Calculation (min) 46 min    Activity Tolerance Patient tolerated treatment well    Behavior During Therapy WFL for tasks assessed/performed             Past Medical History:  Diagnosis Date   Anxiety    Chronic pain of right knee    DM type 2, controlled, with complication (HCC) 08/26/2021   Hypertension    Insomnia 01/2019   Stroke [facility])    Past Surgical History:  Procedure Laterality Date   HERNIA REPAIR     INCISION AND DRAINAGE ABSCESS Left 07/21/2017   Procedure: INCISION AND DRAINAGE INGUINAL HERNIA ABSCESS;  Surgeon: Emelia Loron, MD;  Location: [facility] OR;  Service: General;  Laterality: Left;   INGUINAL HERNIA REPAIR Bilateral 05/07/2017   Procedure: LAPAROSCOPIC RIGHT  INGUINAL HERNIA REPAIR AND  OPEN LEFT   INGUINAL EXPLORATION;  Surgeon: Axel Filler, MD;  Location: [facility] OR;  Service: General;  Laterality: Bilateral;   INSERTION OF MESH Bilateral 05/07/2017   Procedure: INSERTION OF MESH;  Surgeon: Axel Filler, MD;  Location: [facility] OR;  Service: General;  Laterality: Bilateral;   LEG SURGERY     Patient Active Problem List   Diagnosis Date Noted   Skin wound from surgical incision 06/10/2022   DM type 2, controlled, with complication (HCC) 08/26/2021    Insomnia 06/23/2019   Anxiety 06/23/2019   Soft tissue abscess of inguinal region 07/21/2017   Abscess 06/04/2017   Recurrent inguinal hernia 05/07/2017   S/P hernia repair 05/07/2017   Chronic pain of right knee 04/22/2016   PAD (peripheral artery disease) (HCC) 05/02/2015   Stroke (cerebrum) (HCC) 04/08/2015   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   CVA (cerebral vascular accident) (HCC) 04/03/2015   Seizures (HCC) 04/03/2015   Stroke (HCC) 03/22/2015   HTN (hypertension) 03/22/2015   HLD (hyperlipidemia) 03/22/2015   Prediabetes 03/22/2015   Tobacco use disorder 03/22/2015   [name] (transient ischemic attack) 03/18/2015   History of fracture of ankle    Right ankle pain     ONSET DATE: 03/2015  REFERRING DIAG: L CVA - R sided weakness  THERAPY DIAG:  Other lack of coordination  Other symptoms and signs involving the nervous system  Rationale for Evaluation and Treatment: Rehabilitation  SUBJECTIVE:   SUBJECTIVE STATEMENT: "My left side is worse today, its painful and weak" Pt accompanied by: self  PERTINENT HISTORY: Has been weak on the right side since stroke in 2016; did not have therapy after stroke; continued weakness right side; "they wouldn't give me therapy because I didn't have insurance" Reports some numbness and tingling posterior aspects of right side; reports some drool issues and stuttering  when I get upset or anxious.   PRECAUTIONS: None  WEIGHT BEARING RESTRICTIONS: No  PAIN: Are you having pain? Yes: NPRS scale: 5/10 Pain location: L shoulder Pain description: aching Aggravating factors: movement Relieving factors: not moving  FALLS: Has patient fallen in last 6 months? No  LIVING ENVIRONMENT: Lives with: lives with their partner Lives in: House/apartment  PLOF: Independent  PATIENT GOALS: To improve strength and mobility  OBJECTIVE:   HAND DOMINANCE: Right  ADLs: Overall ADLs: Pt has difficulty with fine  motor tasks such as tying shoes, shaving, putting on a belt. Due to weakness, he is unable to lift and carrying items during cooking/cleaning/yard work.  MOBILITY STATUS: Independent  ACTIVITY TOLERANCE: Activity tolerance: Good  FUNCTIONAL OUTCOME MEASURES: Quick Dash: 54.55  UPPER EXTREMITY ROM:    Active ROM Right eval Right 10/29/22  Shoulder flexion 124 142  Shoulder abduction 117 139  Shoulder internal rotation 90 90  Shoulder external rotation 49 54  (Blank rows = not tested)  UPPER EXTREMITY MMT:     MMT Right eval Right 10/29/22  Shoulder flexion 4/5 4+/5  Shoulder abduction 4/5 4+/5  Shoulder adduction 5/5 5/5  Shoulder extension 5/5 5/5  Shoulder internal rotation 5/5 5/5  Shoulder external rotation 4+/5 4/5  Elbow flexion 5/5 5/5  Elbow extension 4+/5 4+/5  Wrist flexion 5/5 5/5  Wrist extension 4/5 5/5  Wrist ulnar deviation 5/5 5/5  Wrist radial deviation 4+/5 5/5  Wrist pronation 4-/5 4+/5  Wrist supination 4+/5 5/5  (Blank rows = not tested)  HAND FUNCTION: Grip strength: Right: 59 lbs; Left: 125 lbs, Lateral pinch: Right: 21 lbs, Left: 25 lbs, and 3 point pinch: Right: 21 lbs, Left: 22 lbs Grip strength: Right: 65 lbs; Lateral pinch: Right: 21 lbs, and 3 point pinch: Right:  lbs  COORDINATION: 9 Hole Peg test: Right: 52.82 sec; Left: 30.61 sec 9 Hole Peg test: Right: 41.61 sec  SENSATION: Light touch: Impaired  Proprioception: Impaired   EDEMA: Mild edema noted in the R hand  VISION: Subjective report: Blurred vision Baseline vision: No visual deficits Visual history:  no deficits  VISION ASSESSMENT: Not tested  Patient has difficulty with following activities due to following visual impairments: Reading, especially at night  OBSERVATIONS: Poor motor control, slow movements   TODAY'S TREATMENT:                                                                                                                              DATE:    10/29/22 -A/ROM: flexion, abduction, protraction, horizontal abduction, er/IR, x10 -9 hole peg test -measurements for reassessment -Rebounder: blue weighted ball, 31ft, 22ft, 73ft, throwing with RUE and catching with both. -Scapular Strengthening: blue band, extension, retraction, protraction, rows, x15 -Shoulder Strengthening: blue band, horizontal abduction, er, IR, abduction, flexion, x15  10/15/22 -Shoulder Strengthening: 4lb dumbbellls, flexion, abduction, protraction, horizontal abduction, er/IR, x10 -Wrist Strengthening: 4lb dumbbells, flexion, extension, ulnar/radial deviation, supination/pronation, x15 -Scapular Strengthening:  blue band, extension, retraction, protraction, rows, x15 -Shoulder strengthening: green band, er, IR, red band horizontal abduction and flexion, yellow band, abduction, x10 -Grooved peg board -Pinch strengthening: green, blue, black resistance clips, stacking 6 cubes x2  10/08/22 -A/ROM: shoulder flexion, abduction, protraction, horizontal abduction, er/IR, wrist flexion/extension, supination/pronation, ulnar/radial deviation, x10 -Shoulder Strengthening: 4lb dumbbellls, flexion, abduction, protraction, horizontal abduction, er/IR, x10 -Wrist Strengthening: 4lb dumbbells, flexion, extension, ulnar/radial deviation, supination/pronation, x10 -Theraputty: red putty, roll into a ball, flatten into a pancake, roll into a log, tripod pinch x20, lateral pinch x20, roll into a ball, squeeze x10    PATIENT EDUCATION: Education details: Garment/textile technologist Person educated: Patient Education method: Explanation, Demonstration, and Handouts Education comprehension: verbalized understanding and returned demonstration  HOME EXERCISE PROGRAM: 6/21:Work on functional movements with ADL's.  6/28: Shoulder Strengthening and wrist strengthening 7/12: Scapular and shoulder strengthening   GOALS: Goals reviewed with patient? Yes  SHORT TERM GOALS:  Target date: 10/29/22  Pt will be educated on HEP to improve ability to actively use RUE during functional task completion.   Goal status: IN PROGRESS  2.   Pt will increase A/ROM of RUE to [facility] to improve ability to reach overhead and behind back during dressing and bathing tasks.   Goal status: IN PROGRESS  3.  Pt will increase strength in RUE to 5/5 to improve ability to complete lifting tasks required during cooking and cleaning tasks.    Goal status: REVISED  4.  Pt will increase Right grip strength by 15# and pinch strength by 5# to improve ability to grasp and hold pots and pans during meal preparation.   Goal status: IN PROGRESS  5.  Pt will increase fine motor coordination in RUE by completing 9 hole peg test in 35" or less to improve ability to perform dressing tasks including operating buttons or zippers.  Goal status: IN PROGRESS   ASSESSMENT:  CLINICAL IMPRESSION: Pt was reassessed this session where he demonstrated improving ROM and strength, as well as improvement in coordination. He continues to have poor motor planning and slowed reaction times. Remainder of session, OT added strengthening exercises to pt's HEP with good demonstration of movement pattern and positioning with these exercises. Verbal and visual cuing provided throughout session for positioning and technique.   PERFORMANCE DEFICITS: in functional skills including ADLs, IADLs, coordination, dexterity, sensation, ROM, strength, Fine motor control, Gross motor control, body mechanics, and UE functional use.    PLAN:  OT FREQUENCY: 2x/week  OT DURATION: 4 weeks  PLANNED INTERVENTIONS: self care/ADL training, therapeutic exercise, therapeutic activity, neuromuscular re-education, manual therapy, passive range of motion, functional mobility training, electrical stimulation, paraffin, moist heat, patient/family education, coping strategies training, and DME and/or AE instructions  RECOMMENDED OTHER  SERVICES: PT  CONSULTED AND AGREED WITH PLAN OF CARE: Patient  PLAN FOR NEXT SESSION: A/ROM, shoulder strengthening, wrist strengthening, grip and pinch strengthening, coordination tasks.    Trish Mage, OTR/L [facility] [phone] Kennyth Arnold, OT 10/29/2022, 9:48 AM

## 2022-10-29 NOTE — Addendum Note (Signed)
Addended by: Haydee Monica on: 10/29/2022 09:58 AM   Modules accepted: Orders

## 2022-11-01 ENCOUNTER — Encounter (HOSPITAL_COMMUNITY): Payer: Self-pay | Admitting: Occupational Therapy

## 2022-11-01 ENCOUNTER — Ambulatory Visit (HOSPITAL_COMMUNITY): Payer: Medicaid Other | Admitting: Occupational Therapy

## 2022-11-01 DIAGNOSIS — R278 Other lack of coordination: Secondary | ICD-10-CM | POA: Diagnosis not present

## 2022-11-01 DIAGNOSIS — R29818 Other symptoms and signs involving the nervous system: Secondary | ICD-10-CM | POA: Diagnosis not present

## 2022-11-01 NOTE — Patient Instructions (Signed)

## 2022-11-01 NOTE — Therapy (Signed)
OUTPATIENT OCCUPATIONAL THERAPY NEURO TREATMENT NOTE  Patient Name: Aaron Kennedy MRN: [id number] DOB:Apr 05, 1971, 52 y.o., male Today's Date: 11/01/2022  PCP: Ivonne Andrew, NP REFERRING PROVIDER: Ivonne Andrew, NP   END OF SESSION:  OT End of Session - 11/01/22 0818     Visit Number 5    Number of Visits 9    Date for OT Re-Evaluation 11/19/22    Authorization Type [employer]    Authorization Time Period 6 visits (09/24/22-11/22/22)    Authorization - Visit Number 4    Authorization - Number of Visits 6    OT Start Time 0817    OT Stop Time 0901    OT Time Calculation (min) 44 min    Activity Tolerance Patient tolerated treatment well    Behavior During Therapy WFL for tasks assessed/performed             Past Medical History:  Diagnosis Date   Anxiety    Chronic pain of right knee    DM type 2, controlled, with complication (HCC) 08/26/2021   Hypertension    Insomnia 01/2019   Stroke [facility])    Past Surgical History:  Procedure Laterality Date   HERNIA REPAIR     INCISION AND DRAINAGE ABSCESS Left 07/21/2017   Procedure: INCISION AND DRAINAGE INGUINAL HERNIA ABSCESS;  Surgeon: Emelia Loron, MD;  Location: [facility] OR;  Service: General;  Laterality: Left;   INGUINAL HERNIA REPAIR Bilateral 05/07/2017   Procedure: LAPAROSCOPIC RIGHT  INGUINAL HERNIA REPAIR AND  OPEN LEFT   INGUINAL EXPLORATION;  Surgeon: Axel Filler, MD;  Location: [facility] OR;  Service: General;  Laterality: Bilateral;   INSERTION OF MESH Bilateral 05/07/2017   Procedure: INSERTION OF MESH;  Surgeon: Axel Filler, MD;  Location: [facility] OR;  Service: General;  Laterality: Bilateral;   LEG SURGERY     Patient Active Problem List   Diagnosis Date Noted   Skin wound from surgical incision 06/10/2022   DM type 2, controlled, with complication (HCC) 08/26/2021   Insomnia 06/23/2019   Anxiety 06/23/2019   Soft tissue abscess of inguinal region 07/21/2017   Abscess 06/04/2017   Recurrent inguinal  hernia 05/07/2017   S/P hernia repair 05/07/2017   Chronic pain of right knee 04/22/2016   PAD (peripheral artery disease) (HCC) 05/02/2015   Stroke (cerebrum) (HCC) 04/08/2015   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   CVA (cerebral vascular accident) (HCC) 04/03/2015   Seizures (HCC) 04/03/2015   Stroke (HCC) 03/22/2015   HTN (hypertension) 03/22/2015   HLD (hyperlipidemia) 03/22/2015   Prediabetes 03/22/2015   Tobacco use disorder 03/22/2015   [name] (transient ischemic attack) 03/18/2015   History of fracture of ankle    Right ankle pain     ONSET DATE: 03/2015  REFERRING DIAG: L CVA - R sided weakness  THERAPY DIAG:  Other lack of coordination  Other symptoms and signs involving the nervous system  Rationale for Evaluation and Treatment: Rehabilitation  SUBJECTIVE:   SUBJECTIVE STATEMENT: "I'm Feeling good today" Pt accompanied by: self  PERTINENT HISTORY: Has been weak on the right side since stroke in 2016; did not have therapy after stroke; continued weakness right side; "they wouldn't give me therapy because I didn't have insurance" Reports some numbness and tingling posterior aspects of right side; reports some drool issues and stuttering when I get upset or anxious.   PRECAUTIONS: None  WEIGHT BEARING RESTRICTIONS: No  PAIN: Are you having pain? No  FALLS: Has patient fallen in  last 6 months? No  LIVING ENVIRONMENT: Lives with: lives with their partner Lives in: House/apartment  PLOF: Independent  PATIENT GOALS: To improve strength and mobility  OBJECTIVE:   HAND DOMINANCE: Right  ADLs: Overall ADLs: Pt has difficulty with fine motor tasks such as tying shoes, shaving, putting on a belt. Due to weakness, he is unable to lift and carrying items during cooking/cleaning/yard work.  MOBILITY STATUS: Independent  ACTIVITY TOLERANCE: Activity tolerance: Good  FUNCTIONAL OUTCOME MEASURES: Quick Dash: 54.55  UPPER EXTREMITY  ROM:    Active ROM Right eval Right 10/29/22  Shoulder flexion 124 142  Shoulder abduction 117 139  Shoulder internal rotation 90 90  Shoulder external rotation 49 54  (Blank rows = not tested)  UPPER EXTREMITY MMT:     MMT Right eval Right 10/29/22  Shoulder flexion 4/5 4+/5  Shoulder abduction 4/5 4+/5  Shoulder adduction 5/5 5/5  Shoulder extension 5/5 5/5  Shoulder internal rotation 5/5 5/5  Shoulder external rotation 4+/5 4/5  Elbow flexion 5/5 5/5  Elbow extension 4+/5 4+/5  Wrist flexion 5/5 5/5  Wrist extension 4/5 5/5  Wrist ulnar deviation 5/5 5/5  Wrist radial deviation 4+/5 5/5  Wrist pronation 4-/5 4+/5  Wrist supination 4+/5 5/5  (Blank rows = not tested)  HAND FUNCTION: Grip strength: Right: 59 lbs; Left: 125 lbs, Lateral pinch: Right: 21 lbs, Left: 25 lbs, and 3 point pinch: Right: 21 lbs, Left: 22 lbs Grip strength: Right: 65 lbs; Lateral pinch: Right: 21 lbs, and 3 point pinch: Right:  lbs  COORDINATION: 9 Hole Peg test: Right: 52.82 sec; Left: 30.61 sec 9 Hole Peg test: Right: 41.61 sec  SENSATION: Light touch: Impaired  Proprioception: Impaired   EDEMA: Mild edema noted in the R hand  VISION: Subjective report: Blurred vision Baseline vision: No visual deficits Visual history:  no deficits  VISION ASSESSMENT: Not tested  Patient has difficulty with following activities due to following visual impairments: Reading, especially at night  OBSERVATIONS: Poor motor control, slow movements   TODAY'S TREATMENT:                                                                                                                              DATE:   11/01/22 -Digiflex: 7lb full squeeze x15, each finger squeeze x15 -Gripper: 61lbs picking up and gripping 10 medium beads, 55lbs picking up 8 small beads -Theraputty and peg board pattern: red putty, pinch and pulling 11 tiny pegs out of putty and placing them according to the pattern -Coins: flipping 10  pennies over, picking them all up, then translating them to tip pinch to place into slit -Basket ball: dribbling 2x10 down straight line with R hand only -Rebounder: Orange weighted ball, 1ft, 29ft, throwing with RUE and catching with both x12 -Therapy Ball Exercises: flexion, protraction, overhead press, V ups, circles both directions, x10  10/29/22 -A/ROM: flexion, abduction, protraction, horizontal abduction, er/IR, x10 -9 hole peg test -measurements  for reassessment -Rebounder: blue weighted ball, 17ft, 58ft, 70ft, throwing with RUE and catching with both. -Scapular Strengthening: blue band, extension, retraction, protraction, rows, x15 -Shoulder Strengthening: blue band, horizontal abduction, er, IR, abduction, flexion, x15  10/15/22 -Shoulder Strengthening: 4lb dumbbellls, flexion, abduction, protraction, horizontal abduction, er/IR, x10 -Wrist Strengthening: 4lb dumbbells, flexion, extension, ulnar/radial deviation, supination/pronation, x15 -Scapular Strengthening: blue band, extension, retraction, protraction, rows, x15 -Shoulder strengthening: green band, er, IR, red band horizontal abduction and flexion, yellow band, abduction, x10 -Grooved peg board -Pinch strengthening: green, blue, black resistance clips, stacking 6 cubes x2   PATIENT EDUCATION: Education details:Therapy Ball Exercises Person educated: Patient Education method: Explanation, Demonstration, and Handouts Education comprehension: verbalized understanding and returned demonstration  HOME EXERCISE PROGRAM: 6/21:Work on functional movements with ADL's.  6/28: Shoulder Strengthening and wrist strengthening 7/12: Scapular and shoulder strengthening 7/15: Therapy Ball Exercises   GOALS: Goals reviewed with patient? Yes  SHORT TERM GOALS: Target date: 10/29/22  Pt will be educated on HEP to improve ability to actively use RUE during functional task completion.   Goal status: IN PROGRESS  2.   Pt will increase  A/ROM of RUE to [facility] to improve ability to reach overhead and behind back during dressing and bathing tasks.   Goal status: IN PROGRESS  3.  Pt will increase strength in RUE to 5/5 to improve ability to complete lifting tasks required during cooking and cleaning tasks.    Goal status: REVISED  4.  Pt will increase Right grip strength by 15# and pinch strength by 5# to improve ability to grasp and hold pots and pans during meal preparation.   Goal status: IN PROGRESS  5.  Pt will increase fine motor coordination in RUE by completing 9 hole peg test in 35" or less to improve ability to perform dressing tasks including operating buttons or zippers.  Goal status: IN PROGRESS   ASSESSMENT:  CLINICAL IMPRESSION: This session pt reporting no pain and he was able to complete all exercises with no rest breaks. He continues to have weakness and slow motor planning in the RUE, requiring increased time to complete movements/tasks, especially with small fine motor tasks and large gross motor movements with stability. OT providing verbal and visual cuing throughout session for positioning and technique.   PERFORMANCE DEFICITS: in functional skills including ADLs, IADLs, coordination, dexterity, sensation, ROM, strength, Fine motor control, Gross motor control, body mechanics, and UE functional use.    PLAN:  OT FREQUENCY: 2x/week  OT DURATION: 4 weeks  PLANNED INTERVENTIONS: self care/ADL training, therapeutic exercise, therapeutic activity, neuromuscular re-education, manual therapy, passive range of motion, functional mobility training, electrical stimulation, paraffin, moist heat, patient/family education, coping strategies training, and DME and/or AE instructions  RECOMMENDED OTHER SERVICES: PT  CONSULTED AND AGREED WITH PLAN OF CARE: Patient  PLAN FOR NEXT SESSION: A/ROM, shoulder strengthening, wrist strengthening, grip and pinch strengthening, coordination tasks.    Trish Mage,  OTR/L [facility] [phone] Kennyth Arnold, OT 11/01/2022, 9:43 AM

## 2022-11-02 ENCOUNTER — Other Ambulatory Visit: Payer: Self-pay | Admitting: Surgery

## 2022-11-02 ENCOUNTER — Other Ambulatory Visit: Payer: Self-pay

## 2022-11-02 ENCOUNTER — Other Ambulatory Visit (HOSPITAL_COMMUNITY): Payer: Self-pay

## 2022-11-02 DIAGNOSIS — T85598A Other mechanical complication of other gastrointestinal prosthetic devices, implants and grafts, initial encounter: Secondary | ICD-10-CM | POA: Diagnosis not present

## 2022-11-02 DIAGNOSIS — L905 Scar conditions and fibrosis of skin: Secondary | ICD-10-CM | POA: Diagnosis not present

## 2022-11-02 DIAGNOSIS — L02214 Cutaneous abscess of groin: Secondary | ICD-10-CM | POA: Diagnosis not present

## 2022-11-02 DIAGNOSIS — S31109A Unspecified open wound of abdominal wall, unspecified quadrant without penetration into peritoneal cavity, initial encounter: Secondary | ICD-10-CM | POA: Diagnosis not present

## 2022-11-02 DIAGNOSIS — T8183XA Persistent postprocedural fistula, initial encounter: Secondary | ICD-10-CM | POA: Diagnosis not present

## 2022-11-02 DIAGNOSIS — L7682 Other postprocedural complications of skin and subcutaneous tissue: Secondary | ICD-10-CM | POA: Diagnosis not present

## 2022-11-02 MED ORDER — HYDROCODONE-ACETAMINOPHEN 5-325 MG PO TABS
1.0000 | ORAL_TABLET | Freq: Four times a day (QID) | ORAL | 0 refills | Status: DC | PRN
  Filled 2022-11-02: qty 20, 5d supply, fill #0

## 2022-11-05 ENCOUNTER — Ambulatory Visit (HOSPITAL_COMMUNITY): Payer: Medicaid Other | Admitting: Occupational Therapy

## 2022-11-17 ENCOUNTER — Other Ambulatory Visit: Payer: Self-pay | Admitting: Nurse Practitioner

## 2022-11-17 ENCOUNTER — Other Ambulatory Visit: Payer: Self-pay

## 2022-11-17 DIAGNOSIS — I10 Essential (primary) hypertension: Secondary | ICD-10-CM

## 2022-11-17 MED ORDER — LISINOPRIL-HYDROCHLOROTHIAZIDE 10-12.5 MG PO TABS
1.0000 | ORAL_TABLET | Freq: Every day | ORAL | 0 refills | Status: DC
Start: 2022-11-17 — End: 2022-12-21
  Filled 2022-11-17: qty 30, 30d supply, fill #0

## 2022-11-17 MED ORDER — METOPROLOL SUCCINATE ER 25 MG PO TB24
ORAL_TABLET | Freq: Every day | ORAL | 0 refills | Status: DC
Start: 2022-11-17 — End: 2022-12-21
  Filled 2022-11-17: qty 30, 30d supply, fill #0

## 2022-11-19 ENCOUNTER — Other Ambulatory Visit: Payer: Self-pay

## 2022-12-03 ENCOUNTER — Encounter (HOSPITAL_COMMUNITY): Payer: Self-pay | Admitting: Occupational Therapy

## 2022-12-03 ENCOUNTER — Ambulatory Visit (HOSPITAL_COMMUNITY): Payer: Medicaid Other | Attending: Nurse Practitioner | Admitting: Occupational Therapy

## 2022-12-03 DIAGNOSIS — R278 Other lack of coordination: Secondary | ICD-10-CM | POA: Diagnosis not present

## 2022-12-03 DIAGNOSIS — R29818 Other symptoms and signs involving the nervous system: Secondary | ICD-10-CM | POA: Diagnosis not present

## 2022-12-03 NOTE — Therapy (Unsigned)
OUTPATIENT OCCUPATIONAL THERAPY NEURO TREATMENT NOTE  Patient Name: Aaron Kennedy MRN: [id number] DOB:1970/07/23, 52 y.o., male Today's Date: 12/03/2022  PCP: Ivonne Andrew, NP REFERRING PROVIDER: Ivonne Andrew, NP   END OF SESSION:    Past Medical History:  Diagnosis Date   Anxiety    Chronic pain of right knee    DM type 2, controlled, with complication (HCC) 08/26/2021   Hypertension    Insomnia 01/2019   Stroke [facility])    Past Surgical History:  Procedure Laterality Date   HERNIA REPAIR     INCISION AND DRAINAGE ABSCESS Left 07/21/2017   Procedure: INCISION AND DRAINAGE INGUINAL HERNIA ABSCESS;  Surgeon: Emelia Loron, MD;  Location: [facility] OR;  Service: General;  Laterality: Left;   INGUINAL HERNIA REPAIR Bilateral 05/07/2017   Procedure: LAPAROSCOPIC RIGHT  INGUINAL HERNIA REPAIR AND  OPEN LEFT   INGUINAL EXPLORATION;  Surgeon: Axel Filler, MD;  Location: [facility] OR;  Service: General;  Laterality: Bilateral;   INSERTION OF MESH Bilateral 05/07/2017   Procedure: INSERTION OF MESH;  Surgeon: Axel Filler, MD;  Location: [facility] OR;  Service: General;  Laterality: Bilateral;   LEG SURGERY     Patient Active Problem List   Diagnosis Date Noted   Skin wound from surgical incision 06/10/2022   DM type 2, controlled, with complication (HCC) 08/26/2021   Insomnia 06/23/2019   Anxiety 06/23/2019   Soft tissue abscess of inguinal region 07/21/2017   Abscess 06/04/2017   Recurrent inguinal hernia 05/07/2017   S/P hernia repair 05/07/2017   Chronic pain of right knee 04/22/2016   PAD (peripheral artery disease) (HCC) 05/02/2015   Stroke (cerebrum) (HCC) 04/08/2015   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   CVA (cerebral vascular accident) (HCC) 04/03/2015   Seizures (HCC) 04/03/2015   Stroke (HCC) 03/22/2015   HTN (hypertension) 03/22/2015   HLD (hyperlipidemia) 03/22/2015   Prediabetes 03/22/2015   Tobacco use disorder 03/22/2015   [name]  (transient ischemic attack) 03/18/2015   History of fracture of ankle    Right ankle pain     ONSET DATE: 03/2015  REFERRING DIAG: L CVA - R sided weakness  THERAPY DIAG:  No diagnosis found.  Rationale for Evaluation and Treatment: Rehabilitation  SUBJECTIVE:   SUBJECTIVE STATEMENT: "I'm Feeling good today" Pt accompanied by: self  PERTINENT HISTORY: Has been weak on the right side since stroke in 2016; did not have therapy after stroke; continued weakness right side; "they wouldn't give me therapy because I didn't have insurance" Reports some numbness and tingling posterior aspects of right side; reports some drool issues and stuttering when I get upset or anxious.   PRECAUTIONS: None  WEIGHT BEARING RESTRICTIONS: No  PAIN: Are you having pain? No  FALLS: Has patient fallen in last 6 months? No  LIVING ENVIRONMENT: Lives with: lives with their partner Lives in: House/apartment  PLOF: Independent  PATIENT GOALS: To improve strength and mobility  OBJECTIVE:   HAND DOMINANCE: Right  ADLs: Overall ADLs: Pt has difficulty with fine motor tasks such as tying shoes, shaving, putting on a belt. Due to weakness, he is unable to lift and carrying items during cooking/cleaning/yard work.  MOBILITY STATUS: Independent  ACTIVITY TOLERANCE: Activity tolerance: Good  FUNCTIONAL OUTCOME MEASURES: Quick Dash: 54.55 Quick Dash: (8/16) 31.82  UPPER EXTREMITY ROM:    Active ROM Right eval Right 10/29/22 Right 12/03/22  Shoulder flexion 124 142 152  Shoulder abduction 117 139 149  Shoulder internal rotation 90 90 90  Shoulder external rotation 49 54 54  (Blank rows = not tested)  UPPER EXTREMITY MMT:     MMT Right eval Right 10/29/22 Right 12/03/22  Shoulder flexion 4/5 4+/5 4+/5  Shoulder abduction 4/5 4+/5 5/5  Shoulder adduction 5/5 5/5 5/5  Shoulder extension 5/5 5/5 5/5  Shoulder internal rotation 5/5 5/5 5/5  Shoulder external rotation 4+/5 4/5 4+/5   Elbow flexion 5/5 5/5 5/5  Elbow extension 4+/5 4+/5 5/5  Wrist flexion 5/5 5/5 5/5  Wrist extension 4/5 5/5 5/5  Wrist ulnar deviation 5/5 5/5 5/5  Wrist radial deviation 4+/5 5/5 5/5  Wrist pronation 4-/5 4+/5 4+/5  Wrist supination 4+/5 5/5 5/5  (Blank rows = not tested)  HAND FUNCTION: Grip strength: Right: 59 lbs; Left: 125 lbs, Lateral pinch: Right: 21 lbs, Left: 25 lbs, and 3 point pinch: Right: 21 lbs, Left: 22 lbs Grip strength: Right: 65 lbs; Lateral pinch: Right: 21 lbs, and 3 point pinch: Right:  lbs Grip strength: Right: 77 lbs; Lateral pinch: Right: 21 lbs, and 3 point pinch: Right: 21 lbs  COORDINATION: 9 Hole Peg test: Right: 52.82 sec; Left: 30.61 sec 9 Hole Peg test: Right: 41.61 sec 9 Hole Peg test: Right: 35.69 sec  SENSATION: Light touch: Impaired  Proprioception: Impaired   EDEMA: Mild edema noted in the R hand  VISION: Subjective report: Blurred vision Baseline vision: No visual deficits Visual history:  no deficits  VISION ASSESSMENT: Not tested  Patient has difficulty with following activities due to following visual impairments: Reading, especially at night  OBSERVATIONS: Poor motor control, slow movements   TODAY'S TREATMENT:                                                                                                                              DATE:   12/03/22 -measurements for reassessment -Grip strength and pinch strength -9 hole peg test -Stretches: er and ir behind the back with a towel -Modified Push ups: -PNF strengthening:  11/01/22 -Digiflex: 7lb full squeeze x15, each finger squeeze x15 -Gripper: 61lbs picking up and gripping 10 medium beads, 55lbs picking up 8 small beads -Theraputty and peg board pattern: red putty, pinch and pulling 11 tiny pegs out of putty and placing them according to the pattern -Coins: flipping 10 pennies over, picking them all up, then translating them to tip pinch to place into slit -Basket  ball: dribbling 2x10 down straight line with R hand only -Rebounder: Orange weighted ball, 3ft, 40ft, throwing with RUE and catching with both x12 -Therapy Ball Exercises: flexion, protraction, overhead press, V ups, circles both directions, x10  10/29/22 -A/ROM: flexion, abduction, protraction, horizontal abduction, er/IR, x10 -9 hole peg test -measurements for reassessment -Rebounder: blue weighted ball, 80ft, 101ft, 74ft, throwing with RUE and catching with both. -Scapular Strengthening: blue band, extension, retraction, protraction, rows, x15 -Shoulder Strengthening: blue band, horizontal abduction, er, IR, abduction, flexion, x15   PATIENT EDUCATION: Education details:Therapy [employer] Exercises Person  educated: Patient Education method: Explanation, Demonstration, and Handouts Education comprehension: verbalized understanding and returned demonstration  HOME EXERCISE PROGRAM: 6/21:Work on functional movements with ADL's.  6/28: Shoulder Strengthening and wrist strengthening 7/12: Scapular and shoulder strengthening 7/15: Therapy Ball Exercises   GOALS: Goals reviewed with patient? Yes  SHORT TERM GOALS: Target date: 10/29/22  Pt will be educated on HEP to improve ability to actively use RUE during functional task completion.   Goal status: IN PROGRESS  2.   Pt will increase A/ROM of RUE to [facility] to improve ability to reach overhead and behind back during dressing and bathing tasks.   Goal status: IN PROGRESS  3.  Pt will increase strength in RUE to 5/5 to improve ability to complete lifting tasks required during cooking and cleaning tasks.    Goal status: REVISED  4.  Pt will increase Right grip strength by 15# and pinch strength by 5# to improve ability to grasp and hold pots and pans during meal preparation.   Goal status: IN PROGRESS  5.  Pt will increase fine motor coordination in RUE by completing 9 hole peg test in 35" or less to improve ability to perform dressing tasks  including operating buttons or zippers.  Goal status: IN PROGRESS   ASSESSMENT:  CLINICAL IMPRESSION: This session pt reporting no pain and he was able to complete all exercises with no rest breaks. He continues to have weakness and slow motor planning in the RUE, requiring increased time to complete movements/tasks, especially with small fine motor tasks and large gross motor movements with stability. OT providing verbal and visual cuing throughout session for positioning and technique.   PERFORMANCE DEFICITS: in functional skills including ADLs, IADLs, coordination, dexterity, sensation, ROM, strength, Fine motor control, Gross motor control, body mechanics, and UE functional use.    PLAN:  OT FREQUENCY: 2x/week  OT DURATION: 4 weeks  PLANNED INTERVENTIONS: self care/ADL training, therapeutic exercise, therapeutic activity, neuromuscular re-education, manual therapy, passive range of motion, functional mobility training, electrical stimulation, paraffin, moist heat, patient/family education, coping strategies training, and DME and/or AE instructions  RECOMMENDED OTHER SERVICES: PT  CONSULTED AND AGREED WITH PLAN OF CARE: Patient  PLAN FOR NEXT SESSION: A/ROM, shoulder strengthening, wrist strengthening, grip and pinch strengthening, coordination tasks.    Trish Mage, OTR/L [facility] [phone] Kennyth Arnold, OT 12/03/2022, 7:40 AM

## 2022-12-03 NOTE — Patient Instructions (Addendum)
1) Strengthening: Chest Pull - Resisted   Hold Theraband in front of body with hands about shoulder width a part. Pull band a part and back together slowly. Repeat __10-15__ times. Complete ___1_ set(s) per session.. Repeat ___1_ session(s) per day.  http://orth.exer.us/926   Copyright  VHI. All rights reserved.   2) PNF Strengthening: Resisted   Standing with resistive band around each hand, bring right arm up and away, thumb back. Repeat _10-15___ times per set. Do __1__ sets per session. Do __1__ sessions per day.    3) Resisted External Rotation: in Neutral - Bilateral   Sit or stand, tubing in both hands, elbows at sides, bent to 90, forearms forward. Pinch shoulder blades together and rotate forearms out. Keep elbows at sides. Repeat _10-15___ times per set. Do __1__ sets per session. Do _1___ sessions per day.  http://orth.exer.us/966   Copyright  VHI. All rights reserved.   4) PNF Strengthening: Resisted   Standing, hold resistive band above head. Bring right arm down and out from side. Repeat _10-15___ times per set. Do __1__ sets per session. Do __1__ sessions per day.  http://orth.exer.us/922   Copyright  VHI. All rights reserved   1) Flexion Wall Stretch    Face wall, place affected handon wall in front of you. Slide hand up the wall  and lean body in towards the wall. Hold for 10 seconds. Repeat 3-5 times. 1-2 times/day.     2) Towel Stretch with Internal Rotation   Or     Gently pull up (or to the side) your affected arm  behind your back with the assist of a towel. Hold 10 seconds, repeat 3-5 times. 1-2 times/day.             3) Corner Stretch    Stand at a corner of a wall, place your arms on the walls with elbows bent. Lean into the corner until a stretch is felt along the front of your chest and/or shoulders. Hold for 10 seconds. Repeat 3-5X, 1-2 times/day.    4) Posterior Capsule Stretch    Bring the involved arm across  chest. Grasp elbow and pull toward chest until you feel a stretch in the back of the upper arm and shoulder. Hold 10 seconds. Repeat 3-5X. Complete 1-2 times/day.    5) Scapular Retraction    Tuck chin back as you pinch shoulder blades together.  Hold 5 seconds. Repeat 3-5X. Complete 1-2 times/day.    6) External Rotation Stretch:     Place your affected hand on the wall with the elbow bent and gently turn your body the opposite direction until a stretch is felt. Hold 10 seconds, repeat 3-5X. Complete 1-2 times/day.   OR    Standing in an open doorway, place your arm on the edge of the doorway with the shoulder at 90 degrees from the side and elbow bent to 90 degrees. Lean forward until you feel a stretch on the front of your shoulder. Keep neck relaxed. Hold 10 seconds, repeat 3-5X. Complete 1-2 times/day

## 2022-12-07 ENCOUNTER — Other Ambulatory Visit: Payer: Self-pay

## 2022-12-10 ENCOUNTER — Ambulatory Visit: Payer: Self-pay | Admitting: Nurse Practitioner

## 2022-12-21 ENCOUNTER — Other Ambulatory Visit: Payer: Self-pay

## 2022-12-21 ENCOUNTER — Other Ambulatory Visit: Payer: Self-pay | Admitting: Nurse Practitioner

## 2022-12-21 DIAGNOSIS — I10 Essential (primary) hypertension: Secondary | ICD-10-CM

## 2022-12-21 MED ORDER — LISINOPRIL-HYDROCHLOROTHIAZIDE 10-12.5 MG PO TABS
1.0000 | ORAL_TABLET | Freq: Every day | ORAL | 0 refills | Status: DC
  Filled 2022-12-21: qty 30, 30d supply, fill #0

## 2022-12-21 MED ORDER — METOPROLOL SUCCINATE ER 25 MG PO TB24
25.0000 mg | ORAL_TABLET | Freq: Every day | ORAL | 0 refills | Status: DC
Start: 2022-12-21 — End: 2023-01-21
  Filled 2022-12-21: qty 30, 30d supply, fill #0

## 2022-12-22 ENCOUNTER — Other Ambulatory Visit: Payer: Self-pay

## 2022-12-23 ENCOUNTER — Encounter: Payer: Self-pay | Admitting: Nurse Practitioner

## 2022-12-23 ENCOUNTER — Telehealth: Payer: Self-pay

## 2022-12-23 ENCOUNTER — Other Ambulatory Visit: Payer: Self-pay

## 2022-12-23 ENCOUNTER — Ambulatory Visit: Payer: Medicaid Other | Admitting: Nurse Practitioner

## 2022-12-23 VITALS — BP 118/75 | HR 75 | Temp 97.2°F | Wt 226.0 lb

## 2022-12-23 DIAGNOSIS — M25561 Pain in right knee: Secondary | ICD-10-CM

## 2022-12-23 DIAGNOSIS — E118 Type 2 diabetes mellitus with unspecified complications: Secondary | ICD-10-CM

## 2022-12-23 DIAGNOSIS — E782 Mixed hyperlipidemia: Secondary | ICD-10-CM | POA: Diagnosis not present

## 2022-12-23 DIAGNOSIS — Z23 Encounter for immunization: Secondary | ICD-10-CM | POA: Diagnosis not present

## 2022-12-23 DIAGNOSIS — I10 Essential (primary) hypertension: Secondary | ICD-10-CM

## 2022-12-23 DIAGNOSIS — G8929 Other chronic pain: Secondary | ICD-10-CM | POA: Diagnosis not present

## 2022-12-23 LAB — POCT GLYCOSYLATED HEMOGLOBIN (HGB A1C): Hemoglobin A1C: 8.2 % — AB (ref 4.0–5.6)

## 2022-12-23 LAB — HM DIABETES EYE EXAM

## 2022-12-23 MED ORDER — METFORMIN HCL ER 500 MG PO TB24
1000.0000 mg | ORAL_TABLET | Freq: Two times a day (BID) | ORAL | 2 refills | Status: DC
  Filled 2022-12-23: qty 60, 15d supply, fill #0
  Filled 2023-01-21: qty 60, 15d supply, fill #1
  Filled 2023-02-23: qty 60, 15d supply, fill #2

## 2022-12-23 MED ORDER — KETOROLAC TROMETHAMINE 30 MG/ML IJ SOLN
30.0000 mg | Freq: Once | INTRAMUSCULAR | Status: AC
Start: 2022-12-23 — End: 2022-12-23
  Administered 2022-12-23: 30 mg via INTRAMUSCULAR

## 2022-12-23 NOTE — Patient Instructions (Addendum)
1. DM type 2, controlled, with complication (HCC)  - POCT glycosylated hemoglobin (Hb A1C) - metFORMIN (GLUCOPHAGE-XR) 500 MG 24 hr tablet; Take 2 tablets (1,000 mg total) by mouth 2 (two) times daily with a meal.  Dispense: 60 tablet; Refill: 2 - AMB Referral to Pharmacy Medication Management  2. Need for influenza vaccination  - Flu vaccine trivalent PF, 6mos and older(Flulaval,Afluria,Fluarix,Fluzone)  3. Chronic pain of right knee  - ketorolac (TORADOL) 30 MG/ML injection 30 mg  4. Mixed hyperlipidemia  - AMB Referral to Pharmacy Medication Management     Follow up:  Follow up in 3 months

## 2022-12-23 NOTE — Progress Notes (Signed)
@Patient  ID: Aaron Kennedy, male    DOB: January 20, 1971, 52 y.o.   MRN: [id number]  Chief Complaint  Patient presents with   Diabetes    Follow up    Referring provider: Ivonne Andrew, NP  HPI  Aaron Kennedy presents for follow up. He  has a past medical history of Anxiety, Chronic pain of right knee, Hypertension, Insomnia (01/2019), and Stroke (2016).      Hypertension:   Patient also presents today for follow-up on hypertension.  Patient states that he is compliant with Zestoretic.  He states he has been doing well with no new issues or concerns.  Blood pressure was stable in office today.     Diabetes:   Patient is not checking home blood sugars.   Home blood sugar records: patient does not check sugars How often is blood sugars being checked: N/A Current symptoms/problems include none and have been stable. Daily foot checks: yes   Any foot concerns: none Last eye exam: N/A Exercise:  staying active every day A1C in office today is 8.2  Note: We discussed that we will increase patient's metformin to 1000 mg in the morning and in afternoon.  He admits that he is only been taking 500 XR once daily.  We discussed that he does need to start by taking 500 twice daily and then we will increase him to 1000 mg if needed.  Pharmacy will follow.  Also concerned that patient is not compliant with cholesterol medication.  Pharmacy will follow for this as well.  Patient is requesting a Toradol injection today for ongoing chronic right knee pain.  Will give Toradol in office today.  Patient will be getting diabetic eye exam in office today as well.  The diabetic eye team is onsite today.    Denies f/c/s, n/v/d, hemoptysis, PND, leg swelling Denies chest pain or edema     No Known Allergies  Immunization History  Administered Date(s) Administered   Influenza, Seasonal, Injecte, Preservative Fre 12/23/2022   Influenza,inj,Quad PF,6+ Mos 12/29/2015, 02/06/2019,  02/25/2021, 03/01/2022   Pneumococcal Polysaccharide-23 09/26/2015   Tdap 12/29/2015    Past Medical History:  Diagnosis Date   Anxiety    Chronic pain of right knee    DM type 2, controlled, with complication (HCC) 08/26/2021   Hypertension    Insomnia 01/2019   Stroke (HCC)     Tobacco History: Social History   Tobacco Use  Smoking Status Every Day   Current packs/day: 0.50   Average packs/day: 0.5 packs/day for 20.0 years (10.0 ttl pk-yrs)   Types: Cigarettes  Smokeless Tobacco Never   Ready to quit: Not Answered Counseling given: Not Answered   Outpatient Encounter Medications as of 12/23/2022  Medication Sig   atorvastatin (LIPITOR) 80 MG tablet Take 1 tablet (80 mg total) by mouth daily.   clopidogrel (PLAVIX) 75 MG tablet Take 1 tablet (75 mg total) by mouth daily.   fluticasone (FLONASE) 50 MCG/ACT nasal spray Place 2 sprays into both nostrils daily.   gabapentin (NEURONTIN) 600 MG tablet Take 1 tablet (600 mg total) by mouth 2 (two) times daily.   lisinopril-hydrochlorothiazide (ZESTORETIC) 10-12.5 MG tablet TAKE 1 TABLET BY MOUTH DAILY.   metoprolol succinate (TOPROL-XL) 25 MG 24 hr tablet Take 1 tablet (25 mg total) by mouth daily.   [DISCONTINUED] metFORMIN (GLUCOPHAGE-XR) 500 MG 24 hr tablet Take 1 tablet (500 mg total) by mouth 2 (two) times daily with a meal.   benzonatate (TESSALON) 200 MG  capsule Take 1 capsule (200 mg total) by mouth 2 (two) times daily as needed for cough. (Patient not taking: Reported on 12/23/2022)   cephALEXin (KEFLEX) 500 MG capsule Take 1 capsule (500 mg total) by mouth 3 (three) times daily. (Patient not taking: Reported on 09/09/2022)   cetirizine (ZYRTEC) 10 MG tablet Take 1 tablet (10 mg total) by mouth daily. (Patient not taking: Reported on 09/09/2022)   HYDROcodone-acetaminophen (NORCO/VICODIN) 5-325 MG tablet Take 1 tablet by mouth every 6 (six) hours as needed for pain for up to 5 days. (Patient not taking: Reported on 12/23/2022)    ibuprofen (ADVIL) 600 MG tablet Take 1 tablet (600 mg total) by mouth every 8 (eight) hours as needed. (Patient not taking: Reported on 09/09/2022)   metFORMIN (GLUCOPHAGE-XR) 500 MG 24 hr tablet Take 2 tablets (1,000 mg total) by mouth 2 (two) times daily with a meal.   [EXPIRED] ketorolac (TORADOL) 30 MG/ML injection 30 mg    No facility-administered encounter medications on file as of 12/23/2022.     Review of Systems  Review of Systems  Constitutional: Negative.   HENT: Negative.    Cardiovascular: Negative.   Gastrointestinal: Negative.   Allergic/Immunologic: Negative.   Neurological: Negative.   Psychiatric/Behavioral: Negative.         Physical Exam  BP 118/75   Pulse 75   Temp (!) 97.2 F (36.2 C)   Wt 226 lb (102.5 kg)   SpO2 98%   BMI 31.52 kg/m   Wt Readings from Last 5 Encounters:  12/23/22 226 lb (102.5 kg)  09/09/22 230 lb 12.8 oz (104.7 kg)  06/10/22 231 lb 3.2 oz (104.9 kg)  03/01/22 232 lb (105.2 kg)  11/26/21 230 lb 8 oz (104.6 kg)     Physical Exam Vitals and nursing note reviewed.  Constitutional:      General: He is not in acute distress.    Appearance: He is well-developed.  Cardiovascular:     Rate and Rhythm: Normal rate and regular rhythm.  Pulmonary:     Effort: Pulmonary effort is normal.     Breath sounds: Normal breath sounds.  Skin:    General: Skin is warm and dry.  Neurological:     Mental Status: He is alert and oriented to person, place, and time.      Lab Results:  CBC    Component Value Date/Time   WBC 8.2 09/09/2022 0922   WBC 6.7 07/22/2017 0710   RBC 4.65 09/09/2022 0922   RBC 4.17 (L) 07/22/2017 0710   HGB 15.4 09/09/2022 0922   HCT 47.2 09/09/2022 0922   PLT 187 09/09/2022 0922   MCV 102 (H) 09/09/2022 0922   MCH 33.1 (H) 09/09/2022 0922   MCH 32.4 07/22/2017 0710   MCHC 32.6 09/09/2022 0922   MCHC 33.2 07/22/2017 0710   RDW 13.2 09/09/2022 0922   LYMPHSABS 3.0 02/06/2019 0956   MONOABS 0.7  07/21/2017 0456   EOSABS 0.4 02/06/2019 0956   BASOSABS 0.1 02/06/2019 0956    BMET    Component Value Date/Time   NA 135 09/09/2022 0922   K 4.5 09/09/2022 0922   CL 101 09/09/2022 0922   CO2 22 09/09/2022 0922   GLUCOSE 233 (H) 09/09/2022 0922   GLUCOSE 98 07/22/2017 0710   BUN 15 09/09/2022 0922   CREATININE 0.86 09/09/2022 0922   CREATININE 1.11 12/24/2016 1354   CALCIUM 8.9 09/09/2022 0922   GFRNONAA 90 03/24/2020 1213   GFRNONAA 79 12/24/2016 1354  GFRAA 104 03/24/2020 1213   GFRAA 92 12/24/2016 1354      Assessment & Plan:   DM type 2, controlled, with complication (HCC) - POCT glycosylated hemoglobin (Hb A1C) - metFORMIN (GLUCOPHAGE-XR) 500 MG 24 hr tablet; Take 2 tablets (1,000 mg total) by mouth 2 (two) times daily with a meal.  Dispense: 60 tablet; Refill: 2 - AMB Referral to Pharmacy Medication Management  2. Need for influenza vaccination  - Flu vaccine trivalent PF, 6mos and older(Flulaval,Afluria,Fluarix,Fluzone)  3. Chronic pain of right knee  - ketorolac (TORADOL) 30 MG/ML injection 30 mg  4. Mixed hyperlipidemia  - AMB Referral to Pharmacy Medication Management     Follow up:  Follow up in 3 months  Patient Instructions  1. DM type 2, controlled, with complication (HCC)  - POCT glycosylated hemoglobin (Hb A1C) - metFORMIN (GLUCOPHAGE-XR) 500 MG 24 hr tablet; Take 2 tablets (1,000 mg total) by mouth 2 (two) times daily with a meal.  Dispense: 60 tablet; Refill: 2 - AMB Referral to Pharmacy Medication Management  2. Need for influenza vaccination  - Flu vaccine trivalent PF, 6mos and older(Flulaval,Afluria,Fluarix,Fluzone)  3. Chronic pain of right knee  - ketorolac (TORADOL) 30 MG/ML injection 30 mg  4. Mixed hyperlipidemia  - AMB Referral to Pharmacy Medication Management     Follow up:  Follow up in 3 months    Ivonne Andrew, NP 12/23/2022

## 2022-12-23 NOTE — Progress Notes (Signed)
   Care Guide Note  12/23/2022 Name: Aaron Kennedy MRN: [id number] DOB: 05-Oct-1970  Referred by: Ivonne Andrew, NP Reason for referral : Care Coordination (Outreach to schedule with Pharm d )   Aaron Kennedy is a 52 y.o. year old male who is a primary care patient of Ivonne Andrew, NP. [name] was referred to the pharmacist for assistance related to HLD and DM.    Successful contact was made with the patient to discuss pharmacy services including being ready for the pharmacist to call at least 5 minutes before the scheduled appointment time, to have medication bottles and any blood sugar or blood pressure readings ready for review. The patient agreed to meet with the pharmacist via with the pharmacist via telephone visit on (date/time).  01/27/2023  Penne Lash, RMA Care Guide [facility]  [place], [place] [address] Direct Dial: [phone] [email or web address]

## 2022-12-23 NOTE — Assessment & Plan Note (Addendum)
-   POCT glycosylated hemoglobin (Hb A1C) - metFORMIN (GLUCOPHAGE-XR) 500 MG 24 hr tablet; Take 2 tablets (1,000 mg total) by mouth 2 (two) times daily with a meal.  Dispense: 60 tablet; Refill: 2 - AMB Referral to Pharmacy Medication Management  2. Need for influenza vaccination  - Flu vaccine trivalent PF, 6mos and older(Flulaval,Afluria,Fluarix,Fluzone)  3. Chronic pain of right knee  - ketorolac (TORADOL) 30 MG/ML injection 30 mg  4. Mixed hyperlipidemia  - AMB Referral to Pharmacy Medication Management     Follow up:  Follow up in 3 months

## 2023-01-21 ENCOUNTER — Other Ambulatory Visit: Payer: Self-pay

## 2023-01-21 ENCOUNTER — Other Ambulatory Visit: Payer: Self-pay | Admitting: Nurse Practitioner

## 2023-01-21 DIAGNOSIS — I10 Essential (primary) hypertension: Secondary | ICD-10-CM

## 2023-01-21 MED ORDER — LISINOPRIL-HYDROCHLOROTHIAZIDE 10-12.5 MG PO TABS
1.0000 | ORAL_TABLET | Freq: Every day | ORAL | 0 refills | Status: DC
  Filled 2023-01-21: qty 30, 30d supply, fill #0

## 2023-01-21 MED ORDER — METOPROLOL SUCCINATE ER 25 MG PO TB24
25.0000 mg | ORAL_TABLET | Freq: Every day | ORAL | 0 refills | Status: DC
  Filled 2023-01-21: qty 30, 30d supply, fill #0

## 2023-01-27 ENCOUNTER — Other Ambulatory Visit: Payer: Medicaid Other | Admitting: Pharmacist

## 2023-01-27 ENCOUNTER — Other Ambulatory Visit: Payer: Self-pay

## 2023-01-27 DIAGNOSIS — I6302 Cerebral infarction due to thrombosis of basilar artery: Secondary | ICD-10-CM

## 2023-01-27 DIAGNOSIS — E785 Hyperlipidemia, unspecified: Secondary | ICD-10-CM

## 2023-01-27 DIAGNOSIS — I10 Essential (primary) hypertension: Secondary | ICD-10-CM

## 2023-01-27 DIAGNOSIS — E118 Type 2 diabetes mellitus with unspecified complications: Secondary | ICD-10-CM

## 2023-01-27 DIAGNOSIS — I739 Peripheral vascular disease, unspecified: Secondary | ICD-10-CM

## 2023-01-27 MED ORDER — ACCU-CHEK SOFTCLIX LANCETS MISC
3 refills | Status: AC
Start: 2023-01-27 — End: ?
  Filled 2023-01-27: qty 100, 34d supply, fill #0

## 2023-01-27 MED ORDER — BLOOD GLUCOSE TEST VI STRP
ORAL_STRIP | 3 refills | Status: AC
Start: 2023-01-27 — End: ?
  Filled 2023-01-27: qty 50, 25d supply, fill #0

## 2023-01-27 MED ORDER — BLOOD GLUCOSE MONITOR SYSTEM W/DEVICE KIT
PACK | 0 refills | Status: AC
  Filled 2023-01-27: qty 1, 30d supply, fill #0

## 2023-01-27 MED ORDER — METOPROLOL SUCCINATE ER 25 MG PO TB24
25.0000 mg | ORAL_TABLET | Freq: Every day | ORAL | 3 refills | Status: DC
  Filled 2023-01-27 – 2023-02-23 (×2): qty 90, 90d supply, fill #0
  Filled 2023-05-23: qty 90, 90d supply, fill #1
  Filled 2023-08-19: qty 90, 90d supply, fill #2
  Filled 2023-11-15: qty 90, 90d supply, fill #3

## 2023-01-27 MED ORDER — LISINOPRIL-HYDROCHLOROTHIAZIDE 10-12.5 MG PO TABS
1.0000 | ORAL_TABLET | Freq: Every day | ORAL | 3 refills | Status: DC
  Filled 2023-01-27 – 2023-02-23 (×2): qty 90, 90d supply, fill #0
  Filled 2023-05-23: qty 90, 90d supply, fill #1
  Filled 2023-08-19: qty 90, 90d supply, fill #2
  Filled 2023-11-15: qty 90, 90d supply, fill #3

## 2023-01-27 MED ORDER — LANCET DEVICE MISC
0 refills | Status: AC
  Filled 2023-01-27: qty 1, fill #0

## 2023-01-27 MED ORDER — CLOPIDOGREL BISULFATE 75 MG PO TABS
75.0000 mg | ORAL_TABLET | Freq: Every day | ORAL | 3 refills | Status: AC
  Filled 2023-01-27 – 2023-02-23 (×2): qty 90, 90d supply, fill #0
  Filled 2023-08-19: qty 90, 90d supply, fill #1
  Filled 2023-12-06: qty 90, 90d supply, fill #2

## 2023-01-27 MED ORDER — ATORVASTATIN CALCIUM 80 MG PO TABS
80.0000 mg | ORAL_TABLET | Freq: Every day | ORAL | 3 refills | Status: AC
  Filled 2023-01-27: qty 90, 90d supply, fill #0
  Filled 2023-09-05 – 2023-10-04 (×2): qty 90, 90d supply, fill #1

## 2023-01-27 NOTE — Progress Notes (Signed)
01/27/2023 Name: Aaron Kennedy MRN: [id number] DOB: [birth year]  Chief Complaint  Patient presents with   Hypertension   Medication Management   Diabetes    Aaron Kennedy is a 52 y.o. year old male who presented for a telephone visit.   They were referred to the pharmacist by their PCP for assistance in managing diabetes and hyperlipidemia.    Subjective:  Care Team: Primary Care Provider: Ivonne Andrew, NP ; Next Scheduled Visit: 03/21/23  Medication Access/Adherence  Current Pharmacy:  [facility] - [facility] [address], [address] [place] [address] Phone: [phone] Fax: [phone]   Patient reports affordability concerns with their medications: No  Patient reports access/transportation concerns to their pharmacy: No  Patient reports adherence concerns with their medications:  Yes     Diabetes:  Current medications: metformin XR 1000 mg twice daily - taking XR 500 mg twice daily at this time. Has been more consistent with administration  Current glucose readings: has not been checking, needs new test strips  Hypertension:  Current medications: metoprolol succinate 25 mg daily, lisinopril/hydrochlorothiazide 10/12.5 mg daily   Hyperlipidemia/ASCVD Risk Reduction  Current lipid lowering medications: atorvastatin 80 mg daily - reports he was previously told to take this in the evening, and he tends to forget. Also notes that he was told by a friend that statins are bad for you. Denies any side effects associated with atorvastatin when he does take it  Antiplatelet regimen: clopidogrel 75 mg daily  ASCVD History: PAD, CVA  Does report pain in his calf when he walks for a long time. Has not been seen by Vascular before.    Objective:  Lab Results  Component Value Date   HGBA1C 8.2 (A) 12/23/2022    Lab Results  Component Value Date   CREATININE 0.86 09/09/2022   BUN 15 09/09/2022   NA 135  09/09/2022   K 4.5 09/09/2022   CL 101 09/09/2022   CO2 22 09/09/2022    Lab Results  Component Value Date   CHOL 195 09/09/2022   HDL 30 (L) 09/09/2022   LDLCALC 146 (H) 09/09/2022   TRIG 105 09/09/2022   CHOLHDL 6.5 (H) 09/09/2022    Medications Reviewed Today     Reviewed by Alden Hipp, RPH-CPP (Pharmacist) on 01/27/23 at 1050  Med List Status: <None>   Medication Order Taking? Sig Documenting Provider Last Dose Status Informant  Accu-Chek Softclix Lancets lancets [id number] Yes Check up to twice daily. Ivonne Andrew, NP  Active   atorvastatin (LIPITOR) 80 MG tablet [id number]  Take 1 tablet (80 mg total) by mouth daily. Ivonne Andrew, NP  Active   Blood Glucose Monitoring Suppl (BLOOD GLUCOSE MONITOR SYSTEM) w/Device KIT [id number] Yes Use as directed up to twice daily. Ivonne Andrew, NP  Active   clopidogrel (PLAVIX) 75 MG tablet [id number]  Take 1 tablet (75 mg total) by mouth daily. Ivonne Andrew, NP  Active   fluticasone [facility]) 50 MCG/ACT nasal spray [id number] No Place 2 sprays into both nostrils daily.  Patient not taking: Reported on 01/27/2023   Ivonne Andrew, NP Not Taking Active            Med Note [facility], [place]   Thu Sep 09, 2022  8:30 AM) PRN   gabapentin (NEURONTIN) 600 MG tablet [id number] No Take 1 tablet (600 mg total) by mouth 2 (two) times daily.  Patient not taking: Reported on 01/27/2023  Ivonne Andrew, NP Not Taking Active   Glucose Blood (BLOOD GLUCOSE TEST STRIPS) STRP [id number] Yes Check up to twice daily. Ivonne Andrew, NP  Active   Lancet Device MISC [id number] Yes Check up to twice daily. May substitute to any manufacturer covered by patient's insurance. Ivonne Andrew, NP  Active   lisinopril-hydrochlorothiazide (ZESTORETIC) 10-12.5 MG tablet [id number]  TAKE 1 TABLET BY MOUTH DAILY. Ivonne Andrew, NP  Active   metFORMIN (GLUCOPHAGE-XR) 500 MG 24 hr tablet [id number] Yes Take 2 tablets (1,000 mg total) by  mouth 2 (two) times daily with a meal. Ivonne Andrew, NP Taking Active            Med Note Clearance Coots, Shaquoya Cosper T   Thu Jan 27, 2023  9:40 AM) 500 twice daily  metoprolol succinate (TOPROL-XL) 25 MG 24 hr tablet [id number]  Take 1 tablet (25 mg total) by mouth daily. Ivonne Andrew, NP  Active               Assessment/Plan:   Diabetes: - Currently uncontrolled - Reviewed long term cardiovascular and renal outcomes of uncontrolled blood sugar - Reviewed goal A1c, goal fasting, and goal 2 hour post prandial glucose - Recommend to continue current regimen at this time. Will consider addition of ASCVD risk-reducing agents at next visit, such as SGLT2 or GLP1.  - Recommend to check glucose twice daily, fasting and 2 hour post prandial  Hypertension: - Currently controlled - Reviewed long term cardiovascular and renal outcomes of uncontrolled blood pressure - Will discuss access to home monitor at next visit - Recommend to continue current regimen at this time - Will collaborate with PCP for refills and will transition to 90 day supply for cost savings.    Hyperlipidemia/ASCVD Risk Reduction: - Currently uncontrolled, discussed goal LDL <55 given severe disease - Reviewed long term complications of uncontrolled cholesterol - Recommend to move atorvastatin to AM administration. Discussed risks and benefits. Patient agrees to take every morning. Check lipids with next visit.  - Discussed referral to Vascular, patient and PCP amenable. Will collaborate with PCP for order.  - Will collaborate with PCP for refills and will transition to 90 day supply for cost savings.   Follow Up Plan: phone call in 4 weeks  Catie TClearance Coots, PharmD, BCACP, CPP Clinical Pharmacist [facility] [phone]

## 2023-01-28 ENCOUNTER — Other Ambulatory Visit: Payer: Self-pay

## 2023-02-11 ENCOUNTER — Ambulatory Visit: Payer: Medicaid Other | Admitting: Podiatry

## 2023-02-14 ENCOUNTER — Other Ambulatory Visit: Payer: Self-pay | Admitting: Surgery

## 2023-02-14 DIAGNOSIS — Z8719 Personal history of other diseases of the digestive system: Secondary | ICD-10-CM | POA: Diagnosis not present

## 2023-02-14 DIAGNOSIS — T148XXA Other injury of unspecified body region, initial encounter: Secondary | ICD-10-CM

## 2023-02-14 DIAGNOSIS — L7682 Other postprocedural complications of skin and subcutaneous tissue: Secondary | ICD-10-CM | POA: Diagnosis not present

## 2023-02-23 ENCOUNTER — Other Ambulatory Visit: Payer: Self-pay

## 2023-02-23 ENCOUNTER — Other Ambulatory Visit: Payer: Self-pay | Admitting: Nurse Practitioner

## 2023-02-23 DIAGNOSIS — G629 Polyneuropathy, unspecified: Secondary | ICD-10-CM

## 2023-02-24 ENCOUNTER — Ambulatory Visit: Payer: Medicaid Other | Admitting: Podiatry

## 2023-02-24 ENCOUNTER — Ambulatory Visit (INDEPENDENT_AMBULATORY_CARE_PROVIDER_SITE_OTHER): Payer: Medicaid Other

## 2023-02-24 ENCOUNTER — Other Ambulatory Visit: Payer: Self-pay

## 2023-02-24 ENCOUNTER — Encounter: Payer: Self-pay | Admitting: Podiatry

## 2023-02-24 VITALS — Ht 71.0 in | Wt 226.0 lb

## 2023-02-24 DIAGNOSIS — M216X1 Other acquired deformities of right foot: Secondary | ICD-10-CM

## 2023-02-24 DIAGNOSIS — M7751 Other enthesopathy of right foot: Secondary | ICD-10-CM

## 2023-02-24 DIAGNOSIS — R52 Pain, unspecified: Secondary | ICD-10-CM

## 2023-02-24 MED ORDER — GABAPENTIN 600 MG PO TABS
600.0000 mg | ORAL_TABLET | Freq: Two times a day (BID) | ORAL | 2 refills | Status: DC
  Filled 2023-02-24: qty 60, 30d supply, fill #0
  Filled 2023-04-21: qty 60, 30d supply, fill #1
  Filled 2023-07-25: qty 60, 30d supply, fill #2

## 2023-02-24 NOTE — Progress Notes (Signed)
Subjective:  Patient ID: Aaron Kennedy, male    DOB: Feb 04, 1971,  MRN: [id number]  Chief Complaint  Patient presents with   Foot Pain    RM17: right ankle pain( needs injection)/ pt would like xray done as well    52 y.o. male presents with concern for chronic right ankle pain.  He is requesting injection.  He states that he has severe pain in the ankle that is chronic seems to worsen over time.  He had many ankle sprains and fractures in his lifetime as he played a lot of basketball when he was younger.  Pain is worse with range of motion including going up and steps and going side-to-side on uneven surfaces.  Previously I performed surgery to include right dorsal midfoot exostectomy he says he has no pain or issues at that location feeling much better there.  Past Medical History:  Diagnosis Date   Anxiety    Chronic pain of right knee    DM type 2, controlled, with complication (HCC) 08/26/2021   Hypertension    Insomnia 01/2019   Stroke (HCC)     No Known Allergies  ROS: Negative except as per HPI above  Objective:  General: AAO x3, NAD  Dermatological: Surgical scar present right midfoot dorsally.  Vascular:  Dorsalis Pedis artery and Posterior Tibial artery pedal pulses are 2/4 bilateral.  Capillary fill time < 3 sec to all digits.   Neruologic: Grossly intact via light touch bilateral. Protective threshold intact to all sites bilateral.   Musculoskeletal: Pain with palpation about the anterior aspect of the tibiotalar joint on the right lower extremity.  Pain is increased with ankle dorsiflexion plantarflexion inversion eversion range of motion.  Mild edema noted of the right ankle joint.  Gait: Unassisted, Nonantalgic.   No images are attached to the encounter.  Radiographs:  Date: 02/24/2023 XR right ankle weightbearing AP/Lateral/Oblique   Findings: Tension directed the tibiotalar joint there is an to be joint space narrowing as well as anterior osteophyte  formation of the distal tibia.  On the medial gutter there is osseous fragmentation likely old fracture fragments present distal to the medial malleolus.  Calcification of the deep deltoid.  Overall consistent with moderate tibiotalar joint posttraumatic arthritic changes. Assessment:   1. Capsulitis of right ankle   2. Other acquired deformities of right foot      Plan:  Patient was evaluated and treated and all questions answered.  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 -Likely synovitis given his history of recurrent ankle injuries -Recommend steroid injection -After sterile prep injected 1 cc half percent Marcaine plain with 1 cc Kenalog 10 in the medial aspect of the tibiotalar joint on the right ankle.  Patient tolerated well and said there was immediate relief. -Would like to order MRI of the right ankle without contrast to further assess for right ankle synovitis and arthritic changes in the tibiotalar joint order placed.  Patient has upcoming MRI for his abdomen at [facility] we will see if we can add on at the same time -Patient will follow-up approximately 3 to 4 weeks after he gets his MRI to discuss the results would consider possible surgical intervention and we discussed that this would include ankle arthroscopy with debridement.  Return for after MRI.          Corinna Gab, DPM [facility] /  [facility]

## 2023-02-26 ENCOUNTER — Ambulatory Visit (HOSPITAL_COMMUNITY)
Admission: RE | Admit: 2023-02-26 | Discharge: 2023-02-26 | Disposition: A | Discharge status: Home / Self Care | Payer: Medicaid Other | Source: Ambulatory Visit | Attending: Podiatry | Admitting: Podiatry

## 2023-02-26 ENCOUNTER — Ambulatory Visit (HOSPITAL_COMMUNITY)
Admission: RE | Admit: 2023-02-26 | Discharge: 2023-02-26 | Disposition: A | Discharge status: Home / Self Care | Payer: Medicaid Other | Source: Ambulatory Visit | Attending: Surgery | Admitting: Surgery

## 2023-02-26 DIAGNOSIS — M25571 Pain in right ankle and joints of right foot: Secondary | ICD-10-CM | POA: Diagnosis not present

## 2023-02-26 DIAGNOSIS — M216X1 Other acquired deformities of right foot: Secondary | ICD-10-CM

## 2023-02-26 DIAGNOSIS — M129 Arthropathy, unspecified: Secondary | ICD-10-CM | POA: Diagnosis not present

## 2023-02-26 DIAGNOSIS — T148XXA Other injury of unspecified body region, initial encounter: Secondary | ICD-10-CM | POA: Insufficient documentation

## 2023-02-26 DIAGNOSIS — M65971 Unspecified synovitis and tenosynovitis, right ankle and foot: Secondary | ICD-10-CM | POA: Diagnosis not present

## 2023-02-26 DIAGNOSIS — R1909 Other intra-abdominal and pelvic swelling, mass and lump: Secondary | ICD-10-CM | POA: Diagnosis not present

## 2023-02-26 MED ORDER — GADOBUTROL 1 MMOL/ML IV SOLN
10.0000 mL | Freq: Once | INTRAVENOUS | Status: AC | PRN
  Administered 2023-02-26: 10 mL via INTRAVENOUS

## 2023-03-01 ENCOUNTER — Other Ambulatory Visit: Payer: Self-pay

## 2023-03-01 ENCOUNTER — Other Ambulatory Visit (INDEPENDENT_AMBULATORY_CARE_PROVIDER_SITE_OTHER): Payer: Medicaid Other

## 2023-03-01 DIAGNOSIS — I739 Peripheral vascular disease, unspecified: Secondary | ICD-10-CM

## 2023-03-01 LAB — VAS US ABI WITH/WO TBI
Left ABI: 0.73
Right ABI: 0.82

## 2023-03-03 ENCOUNTER — Other Ambulatory Visit: Payer: Self-pay

## 2023-03-03 ENCOUNTER — Other Ambulatory Visit: Payer: Medicaid Other | Admitting: Pharmacist

## 2023-03-03 DIAGNOSIS — I10 Essential (primary) hypertension: Secondary | ICD-10-CM

## 2023-03-03 DIAGNOSIS — E118 Type 2 diabetes mellitus with unspecified complications: Secondary | ICD-10-CM | POA: Diagnosis not present

## 2023-03-03 DIAGNOSIS — I739 Peripheral vascular disease, unspecified: Secondary | ICD-10-CM

## 2023-03-03 MED ORDER — METFORMIN HCL ER 500 MG PO TB24
500.0000 mg | ORAL_TABLET | Freq: Two times a day (BID) | ORAL | 1 refills | Status: DC
  Filled 2023-03-03 – 2023-04-21 (×2): qty 180, 90d supply, fill #0

## 2023-03-03 MED ORDER — OZEMPIC (0.25 OR 0.5 MG/DOSE) 2 MG/3ML ~~LOC~~ SOPN
PEN_INJECTOR | SUBCUTANEOUS | 2 refills | Status: DC
  Filled 2023-03-03: qty 3, 42d supply, fill #0
  Filled 2023-04-21: qty 3, 28d supply, fill #1
  Filled 2023-05-23: qty 3, 28d supply, fill #2

## 2023-03-03 NOTE — Progress Notes (Signed)
03/03/2023 Name: Aaron Kennedy MRN: [id number] DOB: 1970-05-29  Chief Complaint  Patient presents with   Hyperlipidemia   Hypertension   Diabetes    PANTELIS CASTRILLO is a 52 y.o. year old male who presented for a telephone visit.   They were referred to the pharmacist by their PCP for assistance in managing diabetes and hyperlipidemia.    Subjective:  Care Team: Primary Care Provider: Ivonne Andrew, NP ; Next Scheduled Visit: 03/21/2023  Medication Access/Adherence  Current Pharmacy:  [facility] - [facility] [address], [address] [place] [address] Phone: [phone] Fax: [phone]   Diabetes:  Current medications: metformin XR 1000 mg twice daily  - Patient reports taking XR 500 mg twice daily at this time  Current glucose readings: - In the morning before eating 280-300 - In the evenings (7:30- 8pm): 220-230 - Patient took glucose while on phone and reported: 262  Using blood glucose meter; testing 1-2 times daily   Patient denies hypoglycemic s/sx including dizziness, shakiness, sweating. Patient denies hyperglycemic symptoms including polyuria, polydipsia, polyphagia, nocturia, neuropathy, blurred vision.  Current meal patterns (2 meals/day):  - Breakfast: boiled eggs, sausage, egg, and biscuit - Supper: grilled/broiled shrimp or fish, home fries, broccoli, pork chops + green beans + red potatoes  - Snacks: cucumber with vinegar, watermelon  - Drinks: coffee with creamer, regular Pepsi (1 in the morning, 1 throughout the day, 1-2 cans at night), water, glass of milk. Patient does not like diet sodas.   Current physical activity: yard work - Not working right now because of recent stroke and current hernia    Hypertension:  Current medications: metoprolol succinate 25 mg daily, lisinopril/hydrochlorothiazide 10/12.5 mg daily  Hyperlipidemia/ASCVD Risk Reduction  Current lipid lowering medications:  atorvastatin 80 mg daily  - Patient taking with food and in the morning - Patient reported some GI upset with atorvastatin at first but was advised by doctor to take with food. This helped and patient reports doing okay with statin now  Antiplatelet regimen: clopidogrel 75 mg daily   ASCVD History: PAD, CVA  Family History: stroke (sister), heart attack (brother) Risk Factors: HLD, HTN, DM  The ASCVD Risk score (Arnett DK, et al., 2019) failed to calculate for the following reasons:   The patient has a prior MI or stroke diagnosis    Objective:  Lab Results  Component Value Date   HGBA1C 8.2 (A) 12/23/2022    Lab Results  Component Value Date   CREATININE 0.86 09/09/2022   BUN 15 09/09/2022   NA 135 09/09/2022   K 4.5 09/09/2022   CL 101 09/09/2022   CO2 22 09/09/2022    Lab Results  Component Value Date   CHOL 195 09/09/2022   HDL 30 (L) 09/09/2022   LDLCALC 146 (H) 09/09/2022   TRIG 105 09/09/2022   CHOLHDL 6.5 (H) 09/09/2022    Medications Reviewed Today     Reviewed by Roslyn Smiling, [facility] (Pharmacist) on 03/03/23 at 1003  Med List Status: <None>   Medication Order Taking? Sig Documenting Provider Last Dose Status Informant  Accu-Chek Softclix Lancets lancets [id number]  Check up to twice daily. Ivonne Andrew, NP  Active   atorvastatin (LIPITOR) 80 MG tablet [id number] Yes Take 1 tablet (80 mg total) by mouth daily. Ivonne Andrew, NP Taking Active   Blood Glucose Monitoring Suppl (BLOOD GLUCOSE MONITOR SYSTEM) w/Device KIT [id number]  Use as directed up to twice daily. Angus Seller  S, NP  Active   clopidogrel (PLAVIX) 75 MG tablet [id number] Yes Take 1 tablet (75 mg total) by mouth daily. Ivonne Andrew, NP Taking Active   fluticasone (FLONASE) 50 MCG/ACT nasal spray [id number] No Place 2 sprays into both nostrils daily.  Patient not taking: Reported on 01/27/2023   Ivonne Andrew, NP Not Taking Active            Med Note [facility], [place]   Thu  Sep 09, 2022  8:30 AM) PRN   gabapentin (NEURONTIN) 600 MG tablet [id number] Yes Take 1 tablet (600 mg total) by mouth 2 (two) times daily. Ivonne Andrew, NP Taking Active   Glucose Blood (BLOOD GLUCOSE TEST STRIPS) STRP [id number]  Check up to twice daily. Ivonne Andrew, NP  Active   Lancet Device MISC [id number]  Check up to twice daily. May substitute to any manufacturer covered by patient's insurance. Ivonne Andrew, NP  Active   lisinopril-hydrochlorothiazide (ZESTORETIC) 10-12.5 MG tablet [id number] Yes TAKE 1 TABLET BY MOUTH DAILY. Ivonne Andrew, NP Taking Active   metFORMIN (GLUCOPHAGE-XR) 500 MG 24 hr tablet [id number] Yes Take 2 tablets (1,000 mg total) by mouth 2 (two) times daily with a meal. Ivonne Andrew, NP Taking Active            Med Note Clearance Coots, Larone Kliethermes T   Thu Jan 27, 2023  9:40 AM) 500 twice daily  metoprolol succinate (TOPROL-XL) 25 MG 24 hr tablet [id number] Yes Take 1 tablet (25 mg total) by mouth daily. Ivonne Andrew, NP Taking Active             Assessment/Plan:   Diabetes: - Currently uncontrolled based on last A1c 8.2% and blood glucose readings - Reviewed long term cardiovascular and renal outcomes of uncontrolled blood sugar - Reviewed goal A1c, goal fasting, and goal 2 hour post prandial glucose - Reviewed dietary modifications including: reducing sugary beverage intake (set a goal of eliminating midday Pepsi and replacing it with sugar-free Gatorade), focusing on meals with more protein and vegetables than carbohydrates, and combing protein with carbs to spike blood glucose less - Recommend SGLT2i or GLP-1 agonist for ASCVD-risk reduction, weight loss, and for additional A1c lowering. Since patient is interested in weight loss (BMI 31.5), a GLP-1 agonist may be a better option   - Recommend to continue metformin XR 500 mg twice daily and begin Ozempic 0.25 mg weekly for 4 weeks, then increase to 0.5 mg weekly - [place] for Ozempic [place] -  Counseled patient on Ozempic adverse effects, cost with Medicaid, and administration  - Recommend to check glucose in the morning while fasting and two-hours after a meal each day.   Hypertension: - Currently controlled based on most recent BP readings from office visits  - Reviewed long term cardiovascular and renal outcomes of uncontrolled blood pressure - Reviewed appropriate blood pressure monitoring technique and reviewed goal blood pressure. Recommended to check home blood pressure and heart rate periodically - Recommend to continue metoprolol succinate 25 mg daily and lisinopril/hydrochlorothiazide 10/12.5 mg daily  Hyperlipidemia/ASCVD Risk Reduction: - Currently uncontrolled based on most recent LDL of 146 in May 2024. LDL goal <55  - Reviewed long term complications of uncontrolled cholesterol - Recommend to continue atorvastatin 80 mg daily with food  - Recommend repeating lipid panel at next PCP visit in December. Could consider Repatha if LDL remains elevated   Anxiety/Insomnia:  - Patient admits taking two Klonopin daily for sleep and stress at bedtime  -  Patient sees PCP in 3 weeks so advised patient to discuss alternative options then   Follow Up Plan: 04/26/2023 with pharmacist via telephone   Roslyn Smiling, PharmD PGY1 Pharmacy Resident 03/03/2023 12:30 PM   I have reviewed the pharmacist's encounter and agree with their documentation.   Catie Eppie Gibson, PharmD, BCACP, CPP [facility] [phone]

## 2023-03-07 NOTE — Progress Notes (Unsigned)
[facility]  ASSESSMENT / PLAN: Aaron Kennedy is a 52 y.o. 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  Recommend:  Abstinence from all tobacco products. Blood glucose control with goal A1c < 7%. Blood pressure control with goal blood pressure < 140/90 mmHg. Lipid reduction therapy with goal LDL-C <100 mg/dL  Aspirin 81mg  PO QD.  Atorvastatin 40-80mg  PO QD (or other "high intensity" statin therapy). Daily walking  Return to care in 3 months with ankle-brachial index to monitor progress with walking regimen.   CHIEF COMPLAINT: Calf cramping with walking  HISTORY OF PRESENT ILLNESS: Aaron Kennedy is a 52 y.o. male referred to clinic for evaluation of cramping discomfort in bilateral calves with walking.  This will occur when he walks about 50 yards.  The patient reports he cannot get through shopping in a large store for cramping discomfort starts.  Rest reliably relieves the pain.  He does not have symptoms typical of ischemic rest pain.  He has no sores on his feet.  He healed his recent foot surgery.  VASCULAR SURGICAL HISTORY: none  VASCULAR RISK FACTORS: Positive history of stroke / transient ischemic attack. Negative history of coronary artery disease.  Positive history of diabetes mellitus. Last A1c 8.2. Positive history of smoking. + actively smoking. Positive history of hypertension.  Negative history of chronic kidney disease.   Negative history of chronic obstructive pulmonary disease.  FUNCTIONAL STATUS: ECOG performance status: (2) Ambulatory and capable of self care, unable to carry out work activity, up and about > 50% or waking hours Ambulatory status: Ambulatory within the community with limits  [name] 1 AND 3 YEAR INDEX Male (2pts) 75-79 or 80-84 (2pts) >84 (3pts) Dependence in toileting (1pt) Partial or full dependence in dressing (1pt) History of  malignant neoplasm (2pts) CHF (3pts) COPD (1pts) CKD (3pts)  0-3 pts 6% 1 year mortality ; 21% 3 year mortality 4-5 pts 12% 1 year mortality ; 36% 3 year mortality >5 pts 21% 1 year mortality; 54% 3 year mortality   Past Medical History:  Diagnosis Date   Anxiety    Chronic pain of right knee    DM type 2, controlled, with complication (HCC) 08/26/2021   Hypertension    Insomnia 01/2019   Stroke [facility])     Past Surgical History:  Procedure Laterality Date   HERNIA REPAIR     INCISION AND DRAINAGE ABSCESS Left 07/21/2017   Procedure: INCISION AND DRAINAGE INGUINAL HERNIA ABSCESS;  Surgeon: Emelia Loron, MD;  Location: [facility] OR;  Service: General;  Laterality: Left;   INGUINAL HERNIA REPAIR Bilateral 05/07/2017   Procedure: LAPAROSCOPIC RIGHT  INGUINAL HERNIA REPAIR AND  OPEN LEFT   INGUINAL EXPLORATION;  Surgeon: Axel Filler, MD;  Location: [facility] OR;  Service: General;  Laterality: Bilateral;   INSERTION OF MESH Bilateral 05/07/2017   Procedure: INSERTION OF MESH;  Surgeon: Axel Filler, MD;  Location: [facility] OR;  Service: General;  Laterality: Bilateral;   LEG SURGERY      Family History  Problem Relation Age of Onset   Stroke Brother        48s   Heart attack Sister        45s    Social History   Socioeconomic History   Marital status: Single    Spouse name: Not on file   Number of children: Not on file   Years of education: Not on file   Highest education level: Not  on file  Occupational History   Not on file  Tobacco Use   Smoking status: Every Day    Current packs/day: 0.50    Average packs/day: 0.5 packs/day for 20.0 years (10.0 ttl pk-yrs)    Types: Cigarettes   Smokeless tobacco: Never  Vaping Use   Vaping status: Never Used  Substance and Sexual Activity   Alcohol use: Yes   Drug use: Yes    Types: Marijuana   Sexual activity: Not Currently  Other Topics Concern   Not on file  Social History Narrative   Not on file   Social Determinants of  Health   Financial Resource Strain: Not on file  Food Insecurity: Not on file  Transportation Needs: Not on file  Physical Activity: Not on file  Stress: Not on file  Social Connections: Not on file  Intimate Partner Violence: Not on file    No Known Allergies  Current Outpatient Medications  Medication Sig Dispense Refill   Accu-Chek Softclix Lancets lancets Check up to twice daily. 100 each 3   atorvastatin (LIPITOR) 80 MG tablet Take 1 tablet (80 mg total) by mouth daily. 90 tablet 3   Blood Glucose Monitoring Suppl (BLOOD GLUCOSE MONITOR SYSTEM) w/Device KIT Use as directed up to twice daily. 1 kit 0   clopidogrel (PLAVIX) 75 MG tablet Take 1 tablet (75 mg total) by mouth daily. 90 tablet 3   fluticasone (FLONASE) 50 MCG/ACT nasal spray Place 2 sprays into both nostrils daily. 16 g 6   gabapentin (NEURONTIN) 600 MG tablet Take 1 tablet (600 mg total) by mouth 2 (two) times daily. 60 tablet 2   Glucose Blood (BLOOD GLUCOSE TEST STRIPS) STRP Check up to twice daily. 100 strip 3   Lancet Device MISC Check up to twice daily. May substitute to any manufacturer covered by patient's insurance. 1 each 0   lisinopril-hydrochlorothiazide (ZESTORETIC) 10-12.5 MG tablet TAKE 1 TABLET BY MOUTH DAILY. 90 tablet 3   metFORMIN (GLUCOPHAGE-XR) 500 MG 24 hr tablet Take 1 tablet (500 mg total) by mouth 2 (two) times daily with a meal. 180 tablet 1   metoprolol succinate (TOPROL-XL) 25 MG 24 hr tablet Take 1 tablet (25 mg total) by mouth daily. 90 tablet 3   Semaglutide,0.25 or 0.5MG /DOS, (OZEMPIC, 0.25 OR 0.5 MG/DOSE,) 2 MG/3ML SOPN Inject 0.25 mg weekly for 4 weeks, then increase to 0.5 mg weekly 3 mL 2   No current facility-administered medications for this visit.    PHYSICAL EXAM Vitals:   03/08/23 0825  BP: 136/89  Pulse: 64  Temp: 97.8 F (36.6 C)  TempSrc: Temporal  SpO2: 95%  Weight: 228 lb 12.8 oz (103.8 kg)  Height: 5\' 11"  (1.803 m)    Middle aged man in no distress Regular  rate and rhythm Unlabored breathing No palpable femoral pulses No palpable popliteal pulses No palpable pedal pulses No ulcers on the feet  PERTINENT LABORATORY AND RADIOLOGIC DATA  Most recent CBC    Latest Ref Rng & Units 09/09/2022    9:22 AM 08/26/2021    9:23 AM 02/06/2019    9:56 AM  CBC  WBC 3.4 - 10.8 x10E3/uL 8.2  9.5  8.5   Hemoglobin 13.0 - 17.7 g/dL 96.0  45.4  09.8   Hematocrit 37.5 - 51.0 % 47.2  48.4  47.8   Platelets 150 - 450 x10E3/uL 187  192  191      Most recent CMP    Latest Ref Rng & Units 09/09/2022  9:22 AM 08/26/2021    9:23 AM 02/25/2021   11:25 AM  CMP  Glucose 70 - 99 mg/dL 829  562  95   BUN 6 - 24 mg/dL 15  10  11    Creatinine 0.76 - 1.27 mg/dL 1.30  8.65  7.84   Sodium 134 - 144 mmol/L 135  138  139   Potassium 3.5 - 5.2 mmol/L 4.5  4.7  4.2   Chloride 96 - 106 mmol/L 101  101  101   CO2 20 - 29 mmol/L 22  22    Calcium 8.7 - 10.2 mg/dL 8.9  9.3  9.2   Total Protein 6.0 - 8.5 g/dL 6.5  6.7  6.8   Total Bilirubin 0.0 - 1.2 mg/dL 0.4  0.3  0.4   Alkaline Phos 44 - 121 IU/L 118  124  129   AST 0 - 40 IU/L 12  10  14    ALT 0 - 44 IU/L 26  22      Renal function CrCl cannot be calculated (Patient's most recent lab result is older than the maximum 21 days allowed.).  Hemoglobin A1C (%)  Date Value  12/23/2022 8.2 (A)   HbA1c, POC (prediabetic range) (%)  Date Value  11/26/2021 7.0 (A)   HbA1c, POC (controlled diabetic range) (%)  Date Value  11/26/2021 7.0   HbA1c POC (<> result, manual entry) (%)  Date Value  11/26/2021 7.0    LDL Cholesterol (Calc)  Date Value Ref Range Status  12/24/2016 125 (H) mg/dL (calc) Final    Comment:    Reference range: <100 . Desirable range <100 mg/dL for primary prevention;   <70 mg/dL for patients with CHD or diabetic patients  with > or = 2 CHD risk factors. Marland Kitchen LDL-C is now calculated using the Martin-Hopkins  calculation, which is a validated novel method providing  better accuracy than  the Friedewald equation in the  estimation of LDL-C.  Horald Pollen et al. Lenox Ahr. 6962;952(84): 2061-2068  (http://education.[employer].com/faq/FAQ164)    LDL Chol Calc (NIH)  Date Value Ref Range Status  09/09/2022 146 (H) 0 - 99 mg/dL Final     +-------+-----------+-----------+------------+------------+  ABI/TBIToday's ABIToday's TBIPrevious ABIPrevious TBI  +-------+-----------+-----------+------------+------------+  Right 0.82       0.46                                 +-------+-----------+-----------+------------+------------+  Left  0.73       0.29                                 +-------+-----------+-----------+------------+------------+   Rande Brunt. Lenell Antu, MD FACS [facility] Phone Number: [phone] 03/08/2023 9:20 AM   Total time spent on preparing this encounter including chart review, data review, collecting history, examining the patient, coordinating care for this new patient, 60 minutes.  Portions of this report may have been transcribed using voice recognition software.  Every effort has been made to ensure accuracy; however, inadvertent computerized transcription errors may still be present.

## 2023-03-08 ENCOUNTER — Ambulatory Visit (INDEPENDENT_AMBULATORY_CARE_PROVIDER_SITE_OTHER): Payer: Medicaid Other | Admitting: Vascular Surgery

## 2023-03-08 ENCOUNTER — Encounter: Payer: Self-pay | Admitting: Vascular Surgery

## 2023-03-08 VITALS — BP 136/89 | HR 64 | Temp 97.8°F | Ht 71.0 in | Wt 228.8 lb

## 2023-03-08 DIAGNOSIS — I70213 Atherosclerosis of native arteries of extremities with intermittent claudication, bilateral legs: Secondary | ICD-10-CM | POA: Diagnosis not present

## 2023-03-09 ENCOUNTER — Encounter: Payer: Medicaid Other | Admitting: Vascular Surgery

## 2023-03-09 ENCOUNTER — Other Ambulatory Visit: Payer: Self-pay

## 2023-03-09 ENCOUNTER — Encounter (HOSPITAL_COMMUNITY): Payer: Medicaid Other

## 2023-03-11 ENCOUNTER — Other Ambulatory Visit: Payer: Self-pay

## 2023-03-11 DIAGNOSIS — I739 Peripheral vascular disease, unspecified: Secondary | ICD-10-CM

## 2023-03-25 ENCOUNTER — Encounter: Payer: Self-pay | Admitting: Nurse Practitioner

## 2023-03-25 ENCOUNTER — Ambulatory Visit (INDEPENDENT_AMBULATORY_CARE_PROVIDER_SITE_OTHER): Payer: Medicaid Other | Admitting: Nurse Practitioner

## 2023-03-25 ENCOUNTER — Telehealth: Payer: Self-pay | Admitting: Podiatry

## 2023-03-25 VITALS — BP 137/83 | HR 69 | Wt 231.2 lb

## 2023-03-25 DIAGNOSIS — G8929 Other chronic pain: Secondary | ICD-10-CM | POA: Diagnosis not present

## 2023-03-25 DIAGNOSIS — M25561 Pain in right knee: Secondary | ICD-10-CM | POA: Diagnosis not present

## 2023-03-25 DIAGNOSIS — E118 Type 2 diabetes mellitus with unspecified complications: Secondary | ICD-10-CM

## 2023-03-25 LAB — POCT GLYCOSYLATED HEMOGLOBIN (HGB A1C): HbA1c, POC (controlled diabetic range): 9.3 % — AB (ref 0.0–7.0)

## 2023-03-25 MED ORDER — KETOROLAC TROMETHAMINE 30 MG/ML IJ SOLN
30.0000 mg | Freq: Once | INTRAMUSCULAR | Status: AC
  Administered 2023-03-25: 30 mg via INTRAMUSCULAR

## 2023-03-25 NOTE — Progress Notes (Signed)
Subjective   Patient ID: Aaron Kennedy, male    DOB: 09-Aug-1970, 52 y.o.   MRN: [id number]  Chief Complaint  Patient presents with   Medical Management of Chronic Issues    Referring provider: Ivonne Andrew, NP  [name] is a 52 y.o. male with Past Medical History: No date: Anxiety No date: Chronic pain of right knee 08/26/2021: DM type 2, controlled, with complication (HCC) No date: Hypertension 01/2019: Insomnia No date: Stroke [facility])   HPI  Hypertension:   Patient also presents today for follow-up on hypertension.  Patient states that he is compliant with Zestoretic. He states he has been doing well with no new issues or concerns.  Blood pressure was stable in office today.      Diabetes:   Patient is not checking home blood sugars.   Home blood sugar records: patient does not check sugars How often is blood sugars being checked: N/A Current symptoms/problems include none and have been stable. Daily foot checks: yes   Any foot concerns: none Last eye exam: N/A Exercise:  staying active every day A1C in office today is 9.3   Note: Patient currently on metformin 1000 mg in the morning and in afternoon. Has not started ozempic yet, but will start. Pharmacy will follow.  Also concerned that patient is not compliant with cholesterol medication.  Pharmacy will follow for this as well.   Has been following with surgery for left groin wound from previous mesh with hernia repair.    Patient is requesting a Toradol injection today for ongoing chronic right knee pain.  Will give Toradol in office today.   No Known Allergies  Immunization History  Administered Date(s) Administered   Influenza, Seasonal, Injecte, Preservative Fre 12/23/2022   Influenza,inj,Quad PF,6+ Mos 12/29/2015, 02/06/2019, 02/25/2021, 03/01/2022   Pneumococcal Polysaccharide-23 09/26/2015   Tdap 12/29/2015    Tobacco History: Social History   Tobacco Use  Smoking Status Every Day    Current packs/day: 0.50   Average packs/day: 0.5 packs/day for 20.0 years (10.0 ttl pk-yrs)   Types: Cigarettes  Smokeless Tobacco Never   Ready to quit: Not Answered Counseling given: Not Answered   Outpatient Encounter Medications as of 03/25/2023  Medication Sig   Accu-Chek Softclix Lancets lancets Check up to twice daily.   atorvastatin (LIPITOR) 80 MG tablet Take 1 tablet (80 mg total) by mouth daily.   Blood Glucose Monitoring Suppl (BLOOD GLUCOSE MONITOR SYSTEM) w/Device KIT Use as directed up to twice daily.   clopidogrel (PLAVIX) 75 MG tablet Take 1 tablet (75 mg total) by mouth daily.   fluticasone (FLONASE) 50 MCG/ACT nasal spray Place 2 sprays into both nostrils daily.   gabapentin (NEURONTIN) 600 MG tablet Take 1 tablet (600 mg total) by mouth 2 (two) times daily.   Glucose Blood (BLOOD GLUCOSE TEST STRIPS) STRP Check up to twice daily.   Lancet Device MISC Check up to twice daily. May substitute to any manufacturer covered by patient's insurance.   lisinopril-hydrochlorothiazide (ZESTORETIC) 10-12.5 MG tablet TAKE 1 TABLET BY MOUTH DAILY.   metFORMIN (GLUCOPHAGE-XR) 500 MG 24 hr tablet Take 1 tablet (500 mg total) by mouth 2 (two) times daily with a meal.   metoprolol succinate (TOPROL-XL) 25 MG 24 hr tablet Take 1 tablet (25 mg total) by mouth daily.   Semaglutide,0.25 or 0.5MG /DOS, (OZEMPIC, 0.25 OR 0.5 MG/DOSE,) 2 MG/3ML SOPN Inject 0.25 mg weekly for 4 weeks, then increase to 0.5 mg weekly   [EXPIRED] ketorolac (TORADOL)  30 MG/ML injection 30 mg    No facility-administered encounter medications on file as of 03/25/2023.    Review of Systems  Review of Systems  Constitutional: Negative.   HENT: Negative.    Cardiovascular: Negative.   Gastrointestinal: Negative.   Musculoskeletal:        Right knee pain  Allergic/Immunologic: Negative.   Neurological: Negative.   Psychiatric/Behavioral: Negative.       Objective:   BP 137/83 (BP Location: Left Arm,  Patient Position: Sitting, Cuff Size: Large)   Pulse 69   Wt 231 lb 3.2 oz (104.9 kg)   SpO2 97%   BMI 32.25 kg/m   Wt Readings from Last 5 Encounters:  03/25/23 231 lb 3.2 oz (104.9 kg)  03/08/23 228 lb 12.8 oz (103.8 kg)  02/24/23 226 lb (102.5 kg)  12/23/22 226 lb (102.5 kg)  09/09/22 230 lb 12.8 oz (104.7 kg)     Physical Exam Vitals and nursing note reviewed.  Constitutional:      General: He is not in acute distress.    Appearance: He is well-developed.  Cardiovascular:     Rate and Rhythm: Normal rate and regular rhythm.  Pulmonary:     Effort: Pulmonary effort is normal.     Breath sounds: Normal breath sounds.  Musculoskeletal:     Right knee: Decreased range of motion. Tenderness present.  Skin:    General: Skin is warm and dry.  Neurological:     Mental Status: He is alert and oriented to person, place, and time.       Assessment & Plan:   DM type 2, controlled, with complication (HCC) -     POCT glycosylated hemoglobin (Hb A1C) -     CBC -     Comprehensive metabolic panel  Chronic pain of right knee -     Ketorolac Tromethamine     Return in about 3 months (around 06/23/2023).   Ivonne Andrew, NP 03/25/2023

## 2023-03-25 NOTE — Patient Instructions (Addendum)
1. DM type 2, controlled, with complication (HCC)  - POCT glycosylated hemoglobin (Hb A1C) - CBC - Comprehensive metabolic panel  2. Chronic pain of right knee  - ketorolac (TORADOL) 30 MG/ML injection 30 mg    Follow up:  Follow up in 3 months

## 2023-03-25 NOTE — Telephone Encounter (Signed)
Patient Aaron Kennedy) called in regards to results for MRI done on November 9th 2024. Im showing his results were placed in the system on November 28th 2024. Please call him and go over some concerns he may have.  Thank you

## 2023-03-26 LAB — COMPREHENSIVE METABOLIC PANEL
ALT: 20 [IU]/L (ref 0–44)
AST: 10 [IU]/L (ref 0–40)
Albumin: 4.2 g/dL (ref 3.8–4.9)
Alkaline Phosphatase: 146 [IU]/L — ABNORMAL HIGH (ref 44–121)
BUN/Creatinine Ratio: 10 (ref 9–20)
BUN: 9 mg/dL (ref 6–24)
Bilirubin Total: 0.4 mg/dL (ref 0.0–1.2)
CO2: 21 mmol/L (ref 20–29)
Calcium: 8.8 mg/dL (ref 8.7–10.2)
Chloride: 102 mmol/L (ref 96–106)
Creatinine, Ser: 0.86 mg/dL (ref 0.76–1.27)
Globulin, Total: 2.1 g/dL (ref 1.5–4.5)
Glucose: 272 mg/dL — ABNORMAL HIGH (ref 70–99)
Potassium: 4.4 mmol/L (ref 3.5–5.2)
Sodium: 137 mmol/L (ref 134–144)
Total Protein: 6.3 g/dL (ref 6.0–8.5)
eGFR: 104 mL/min/{1.73_m2} (ref >59)

## 2023-03-26 LAB — CBC
Hematocrit: 46.4 % (ref 37.5–51.0)
Hemoglobin: 15.7 g/dL (ref 13.0–17.7)
MCH: 33.8 pg — ABNORMAL HIGH (ref 26.6–33.0)
MCHC: 33.8 g/dL (ref 31.5–35.7)
MCV: 100 fL — ABNORMAL HIGH (ref 79–97)
Platelets: 198 10*3/uL (ref 150–450)
RBC: 4.64 x10E6/uL (ref 4.14–5.80)
RDW: 12.6 % (ref 11.6–15.4)
WBC: 8.6 10*3/uL (ref 3.4–10.8)

## 2023-03-29 ENCOUNTER — Ambulatory Visit: Payer: Self-pay | Admitting: General Surgery

## 2023-03-29 ENCOUNTER — Other Ambulatory Visit: Payer: Self-pay

## 2023-03-29 ENCOUNTER — Other Ambulatory Visit (HOSPITAL_COMMUNITY): Payer: Self-pay

## 2023-03-29 DIAGNOSIS — L7682 Other postprocedural complications of skin and subcutaneous tissue: Secondary | ICD-10-CM | POA: Diagnosis not present

## 2023-03-29 MED ORDER — SULFAMETHOXAZOLE-TRIMETHOPRIM 800-160 MG PO TABS
1.0000 | ORAL_TABLET | Freq: Two times a day (BID) | ORAL | 0 refills | Status: DC
  Filled 2023-03-29: qty 14, 7d supply, fill #0

## 2023-03-29 MED ORDER — OXYCODONE-ACETAMINOPHEN 5-325 MG PO TABS
1.0000 | ORAL_TABLET | Freq: Four times a day (QID) | ORAL | 0 refills | Status: DC | PRN
  Filled 2023-03-29: qty 20, 5d supply, fill #0

## 2023-03-29 NOTE — H&P (Signed)
Chief Complaint: RECHECK       History of Present Illness: Aaron Kennedy is a 52 y.o. male who is seen today for chronic left inguinal draining sinus.  Patient has had a history of a previous open left inguinal hernia repair in 2005.  He states that thereafter he had an infection.  He states that since that time he had several infections that required drainage in the operating room.  I did do an incision and drainage of that area while fixing his incarcerated right inguinal hernia laparoscopically.   There has been some mesh removed.  Patient recently underwent MRI.  This did appear to contain what I feel is a plug in the left inguinal canal.  I do believe this is the nidus of the chronic drainage.  He has had multiple attempts for silver nitrate, I&D, with no long-lasting results.           Review of Systems: A complete review of systems was obtained from the patient.  I have reviewed this information and discussed as appropriate with the patient.  See HPI as well for other ROS.   ROS      Medical History: Past Medical History      Past Medical History:  Diagnosis Date   Anxiety     DVT (deep venous thrombosis) (CMS/HHS-HCC)     History of stroke     Hypertension     Seizures (CMS/HHS-HCC)          Problem List     Patient Active Problem List  Diagnosis   DM type 2, controlled, with complication (CMS/HHS-HCC)   Anxiety   Cerebral infarction, unspecified (CMS/HHS-HCC)   Chronic pain of right knee   Combined systolic and diastolic cardiac dysfunction   HLD (hyperlipidemia)   HTN (hypertension)   PAD (peripheral artery disease) (CMS-HCC)   S/P hernia repair        Past Surgical History       Past Surgical History:  Procedure Laterality Date   HERNIA REPAIR            Allergies  No Known Allergies     Medications Ordered Prior to Encounter        Current Outpatient Medications on File Prior to Visit  Medication Sig Dispense Refill   atorvastatin  (LIPITOR) 80 MG tablet Take 80 mg by mouth once daily       metFORMIN (GLUCOPHAGE) 500 MG tablet         metFORMIN (GLUCOPHAGE-XR) 500 MG XR tablet Take 500 mg by mouth 2 (two) times daily with meals       metoprolol succinate (TOPROL-XL) 25 MG XL tablet Take 1 tablet by mouth once daily       OZEMPIC 0.25 mg or 0.5 mg (2 mg/3 mL) pen injector Inject 0.25 mg weekly for 4 weeks, then increase to 0.5 mg weekly       gabapentin (NEURONTIN) 600 MG tablet Take 600 mg by mouth 2 (two) times daily       gabapentin (NEURONTIN) 600 MG tablet Take 600 mg by mouth 2 (two) times daily       lisinopriL-hydroCHLOROthiazide (ZESTORETIC) 10-12.5 mg tablet Take 1 tablet by mouth once daily       metoprolol succinate (TOPROL-XL) 25 MG XL tablet Take 1 tablet by mouth once daily        No current facility-administered medications on file prior to visit.        Family History  History reviewed.  No pertinent family history.      Tobacco Use History  Social History       Tobacco Use  Smoking Status Unknown  Smokeless Tobacco Not on file        Social History  Social History        Socioeconomic History   Marital status: Single  Tobacco Use   Smoking status: Unknown  Vaping Use   Vaping status: Unknown  Substance and Sexual Activity   Alcohol use: Defer   Drug use: Defer        Objective:         Vitals:    03/29/23 0913  PainSc:   5    There is no height or weight on file to calculate BMI.   Physical Exam  PE:   Constitutional: No acute distress, conversant, appears states age. Eyes: Anicteric sclerae, moist conjunctiva, no lid lag Lungs: Clear to auscultation bilaterally, normal respiratory effort CV: regular rate and rhythm, no murmurs, no peripheral edema, pedal pulses 2+ GI: Soft, no masses or hepatosplenomegaly, non-tender to palpation, left inguinal draining tract, clear-yellow drainage Skin: No rashes, palpation reveals normal turgor Psychiatric: appropriate judgment  and insight, oriented to person, place, and time       Assessment and Plan:    Assessment Diagnoses and all orders for this visit:   Wound of left groin, sequela       I had a long discussion with the patient in regards to his chronic draining sinus.  I believe this is likely secondary to a retained piece of mesh plug that is in the inguinal canal.  He has been dealing with this since 2019.  He states that has not gotten any better.   1.  I discussed with the patient it may be beneficial to proceed to the operating for a open removal of the mesh plug.  I discussed with him this likely will require a left orchiectomy.  I discussed with him that this will also cause a recurrence of a left inguinal hernia.  I discussed with him we will not place any mesh at this point as this likely will be infected.  We can also fix this laparoscopically in the future if he does have a hernia.  I discussed with him if it is possible we will try to not do an orchiectomy if we can remove the mesh without any compromise to the blood supply.  Patient agreed to this plan. 2.  I will give him a prescription for Bactrim to see if this will dry up the drainage, and also pain medication.       No follow-ups on file.     Axel Filler, MD

## 2023-04-05 ENCOUNTER — Ambulatory Visit: Payer: Medicaid Other | Admitting: Podiatry

## 2023-04-05 DIAGNOSIS — M7751 Other enthesopathy of right foot: Secondary | ICD-10-CM

## 2023-04-05 DIAGNOSIS — M216X1 Other acquired deformities of right foot: Secondary | ICD-10-CM

## 2023-04-05 NOTE — Progress Notes (Signed)
Subjective:  Patient ID: Aaron Kennedy, male    DOB: 1971/02/26,  MRN: [id number]  Chief Complaint  Patient presents with   Foot Pain    RM21: mri results/surgical consult    52 y.o. male presents for follow-up of chronic right ankle pain.  He was previously sent for MRI.  Here to discuss the results and need for possible surgery.  He is still having significant chronic pain with ambulation in the right ankle.  Especially on the inside of his ankle.  States that hurts when he moves his ankle up and down or side-to-side  Past Medical History:  Diagnosis Date   Anxiety    Chronic pain of right knee    DM type 2, controlled, with complication (HCC) 08/26/2021   Hypertension    Insomnia 01/2019   Stroke (HCC)     No Known Allergies  ROS: Negative except as per HPI above  Objective:  General: AAO x3, NAD  Dermatological: Surgical scar present right midfoot dorsally.  Vascular:  Dorsalis Pedis artery and Posterior Tibial artery pedal pulses are 2/4 bilateral.  Capillary fill time < 3 sec to all digits.   Neruologic: Grossly intact via light touch bilateral. Protective threshold intact to all sites bilateral.   Musculoskeletal: Pain with palpation about the anterior aspect of the tibiotalar joint on the right lower extremity.  Pain is increased with ankle dorsiflexion plantarflexion inversion eversion range of motion.  Mild edema noted of the right ankle joint.  Gait: Unassisted, Nonantalgic.   No images are attached to the encounter.  Radiographs:  Date: 02/24/2023 XR right ankle weightbearing AP/Lateral/Oblique   Findings: Tension directed the tibiotalar joint there is an to be joint space narrowing as well as anterior osteophyte formation of the distal tibia.  On the medial gutter there is osseous fragmentation likely old fracture fragments present distal to the medial malleolus.  Calcification of the deep deltoid.  Overall consistent with moderate tibiotalar joint  posttraumatic arthritic changes.  MRI right ankle 02/26/2023 1. Tibialis posterior tenosynovitis and moderate distal tendinopathy. 2. Mild flexor digitorum longus and flexor hallucis longus tenosynovitis. 3. 3 by 15 mm osteochondral lesion of the lateral talar dome manifesting primarily as subcortical marrow edema. 4. Irregular deep tibiotalar portion of the deltoid ligament, possibly previously torn. There is arthropathy between the medial malleolus and the adjacent attachment site of the tibiotalar portion of the deltoid ligament in the talus. Small ossicles noted in this vicinity below the medial malleolus and may have some mild internal edema signal. 5. Thickened slightly indistinct anterior inferior tibiofibular ligament possibly from an old injury. Attenuated and poorly seen anterior talofibular ligament, query remote tear. 6. Long tubular ganglion cyst involves the sinus tarsi and extends laterally and posteriorly to fork around the peroneus tendons. Assessment:   1. Capsulitis of right ankle   2. Other acquired deformities of right foot       Plan:  Patient was evaluated and treated and all questions answered.  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  Also concern for ligament damage to both the deltoid and ATFL -Discussed surgical versus conservative treatment options -Prior steroid injections have been unsuccessful in treating his pain for significant amount of time therefore I recommend surgical intervention as he  has failed other conservative modalities including bracing rest icing and home physical therapy -Discussed surgery would include ankle arthroscopy with debridement of any synovitis and  osseous spurring at the anterior ankle as well as possible repair of osteochondral defect.  Would also plan to reconstruct his deltoid ligament which is damaged on the MRI.  Plan for bone marrow aspirate and concentration with injection.  Possible platelet rich plasma injection. -Patient wishes to proceed discussed the postsurgical recovery time including need for prolonged period of nonweightbearing after surgery.  Informed sugar consent was obtained after discussion of risk benefits alternatives and possible complications associate with the procedures.  We will begin surgical planning          Corinna Gab, DPM [facility] / [facility]

## 2023-04-14 ENCOUNTER — Encounter: Payer: Self-pay | Admitting: Nurse Practitioner

## 2023-04-18 ENCOUNTER — Encounter: Payer: Self-pay | Admitting: Pharmacist

## 2023-04-21 ENCOUNTER — Other Ambulatory Visit: Payer: Self-pay | Admitting: Nurse Practitioner

## 2023-04-21 ENCOUNTER — Encounter (HOSPITAL_COMMUNITY): Payer: Self-pay

## 2023-04-21 ENCOUNTER — Other Ambulatory Visit: Payer: Self-pay

## 2023-04-21 ENCOUNTER — Ambulatory Visit: Payer: Self-pay | Admitting: Nurse Practitioner

## 2023-04-21 ENCOUNTER — Other Ambulatory Visit (HOSPITAL_COMMUNITY): Payer: Self-pay

## 2023-04-21 ENCOUNTER — Telehealth: Payer: Self-pay

## 2023-04-21 MED ORDER — OXYCODONE-ACETAMINOPHEN 5-325 MG PO TABS
1.0000 | ORAL_TABLET | Freq: Four times a day (QID) | ORAL | 0 refills | Status: DC | PRN
  Filled 2023-04-21: qty 20, 5d supply, fill #0

## 2023-04-21 NOTE — Telephone Encounter (Signed)
 Copied from CRM [id number]. Topic: General - Other >> Apr 21, 2023  3:09 PM [name] wrote: Reason for CRM: The patient is requesting a release order for surgery from NP [name] for Aaron Kennedy, DPM [facility] / [facility] for ankle surgery and [facility] [address] [place], [place] [address] [phone] for hernia.

## 2023-04-21 NOTE — Telephone Encounter (Signed)
 Copied from CRM [id number]. Topic: Clinical - Red Word Triage >> Apr 21, 2023  3:20 PM [name] wrote: Red Word that prompted transfer to Nurse Triage: The patient has a hernia that is bleeding, severe pain and stings on the left side.   Chief Complaint: Post op wound problem Symptoms: Blood tinged drainage from wound Frequency: 2 weeks Pertinent Negatives: Patient denies fever or spreading erythema  Disposition: [] ED /[] Urgent Care (no appt availability in office) / [x] Appointment(In office/virtual)/ []  [facility]/ [] Home Care/ [] Refused Recommended Disposition /[] [facility]/ []  Follow-up with PCP Additional Notes: Patient reports he has been experiencing blood tinged drainage from an old surgical wound from 2019 that began 2 weeks ago. He denies any fever or signs of infection, stating he was recently on an antibiotic due to yellow drainage. He states that he is waiting for surgery but has been having difficulty scheduling due to being cleared by his PCP. He reports he has an appointment tomorrow with his PCP, and that he saw his surgical office who sent over the request. Patient instructed to keep that appointment and discuss with his PCP. He understood and is agreeable with this plan.   Reason for Disposition  [1] Clear or blood-tinged fluid draining from wound AND [2] no fever  Answer Assessment - Initial Assessment Questions 1. SYMPTOM: What's the main symptom you're concerned about? (e.g., drainage, incision opening up, pain, redness)     Bloody drainage  2. ONSET: When did bloody drainage start?     2 weeks ago  3. SURGERY: What surgery did you have?     Inguinal hernia repair  4. DATE of SURGERY: When was the surgery?      2019 5. INCISION SITE: Where is the incision located?      Left groin  6. REDNESS: Is there any redness at the incision site? If Yes, ask: How wide across is the redness? (Inches, centimeters)      Slight redness, recently on  antibiotic  7. PAIN: Is there any pain? If Yes, ask: How bad is it?  (Scale 1-10; or mild, moderate, severe)   - NONE (0): no pain   - MILD (1-3): doesn't interfere with normal activities    - MODERATE (4-7): interferes with normal activities or awakens from sleep    - SEVERE (8-10): excruciating pain, unable to do any normal activities     Mild  8. BLEEDING: Is there any bleeding? If Yes, ask: How much? and Where?     Yes, from incision  9. DRAINAGE: Is there any drainage from the incision site? If Yes, ask: What color and how much? (e.g., red, cloudy, pus; drops, teaspoon)     Yellow/bloody 10. FEVER: Do you have a fever? If Yes, ask: What is your temperature, how was it measured, and when did it start?       No 11. OTHER SYMPTOMS: Do you have any other symptoms? (e.g., dizziness, rash elsewhere on body, shaking chills, weakness)       No  Protocols used: Post-Op Incision Symptoms and Questions-A-AH

## 2023-04-21 NOTE — Telephone Encounter (Signed)
 Copied from CRM [id number]. Topic: Clinical - Medication Refill >> Apr 21, 2023  3:14 PM [name] wrote: Most Recent Primary Care Visit:  Provider: OLEY BASCOM RAMAN  Department: [facility]  Visit Type: OFFICE VISIT  Date: 03/25/2023  Medication: oxyCODONE -acetaminophen  (PERCOCET/ROXICET) 5-325 MG tablet  Has the patient contacted their pharmacy? Yes  Is this the correct pharmacy for this prescription? Yes  This is the patient's preferred pharmacy:  [facility] [address] [place] [address] Phone: [phone] Fax: [phone]  Has the prescription been filled recently? Yes  Is the patient out of the medication? Yes  Has the patient been seen for an appointment in the last year OR does the patient have an upcoming appointment? Yes  Can we respond through MyChart?   Agent: Please be advised that Rx refills may take up to 3 business days. We ask that you follow-up with your pharmacy.

## 2023-04-21 NOTE — Telephone Encounter (Signed)
 Copied from CRM [id number]. Topic: Clinical - Medication Refill >> Apr 21, 2023  3:14 PM [name] wrote: Most Recent Primary Care Visit:  Provider: OLEY BASCOM RAMAN  Department: [facility]  Visit Type: OFFICE VISIT  Date: 03/25/2023  Medication: oxyCODONE -acetaminophen  (PERCOCET/ROXICET) 5-325 MG tablet  Has the patient contacted their pharmacy? Yes  Is this the correct pharmacy for this prescription? Yes  This is the patient's preferred pharmacy:  [facility] [address] [place] [address] Phone: [phone] Fax: [phone]  Has the prescription been filled recently? Yes  Is the patient out of the medication? Yes  Has the patient been seen for an appointment in the last year OR does the patient have an upcoming appointment? Yes  Can we respond through MyChart? Yes  Agent: Please be advised that Rx refills may take up to 3 business days. We ask that you follow-up with your pharmacy.

## 2023-04-22 ENCOUNTER — Telehealth: Payer: Self-pay | Admitting: Nurse Practitioner

## 2023-04-22 ENCOUNTER — Ambulatory Visit (INDEPENDENT_AMBULATORY_CARE_PROVIDER_SITE_OTHER): Payer: Medicaid Other | Admitting: Nurse Practitioner

## 2023-04-22 ENCOUNTER — Other Ambulatory Visit (HOSPITAL_COMMUNITY): Payer: Self-pay

## 2023-04-22 ENCOUNTER — Encounter: Payer: Self-pay | Admitting: Nurse Practitioner

## 2023-04-22 VITALS — BP 101/66 | HR 85 | Temp 97.3°F | Wt 226.0 lb

## 2023-04-22 DIAGNOSIS — Z8781 Personal history of (healed) traumatic fracture: Secondary | ICD-10-CM

## 2023-04-22 DIAGNOSIS — K4091 Unilateral inguinal hernia, without obstruction or gangrene, recurrent: Secondary | ICD-10-CM | POA: Diagnosis not present

## 2023-04-22 DIAGNOSIS — E118 Type 2 diabetes mellitus with unspecified complications: Secondary | ICD-10-CM | POA: Diagnosis not present

## 2023-04-22 LAB — POCT GLYCOSYLATED HEMOGLOBIN (HGB A1C): Hemoglobin A1C: 9.2 % — AB (ref 4.0–5.6)

## 2023-04-22 NOTE — Telephone Encounter (Unsigned)
 Copied from CRM [id number]. Topic: Medical Record Request - Records Request >> Apr 22, 2023  4:04 PM Ivette P wrote: Reason for CRM: checking to make sure clearance was sent to the surgeon at [facility] Patient is requesting callback [phone]

## 2023-04-22 NOTE — Patient Instructions (Signed)
 1. DM type 2, controlled, with complication (HCC) (Primary)  - POCT glycosylated hemoglobin (Hb A1C)  2. Unilateral recurrent inguinal hernia without obstruction or gangrene   3. History of fracture of ankle

## 2023-04-22 NOTE — Progress Notes (Signed)
 Subjective   Patient ID: Aaron Kennedy, male    DOB: Jul 23, 1970, 53 y.o.   MRN: [id number]  Chief Complaint  Patient presents with   Procedure    Paperwork filled out     Referring provider: Oley Bascom RAMAN, NP  [name] Medicine is a 53 y.o. male with Past Medical History: No date: Anxiety No date: Chronic pain of right knee 08/26/2021: DM type 2, controlled, with complication (HCC) No date: Hypertension 01/2019: Insomnia No date: Stroke [facility])   HPI  Patient presents today for surgical clearance.  He does need to get scheduled as soon as possible for left inguinal hernia repair.  We will fax letter for clearance to [facility] surgery today.  Patient is also needing clearance for ankle arthroscopy.  He does have a history of diabetes and A1c in office today is 9.2.  Noted concern for delayed healing with uncontrolled diabetes.  Patient is currently now on Ozempic  which should help control A1c.  He has only been on this for the past month now. Denies f/c/s, n/v/d, hemoptysis, PND, leg swelling Denies chest pain or edema   No Known Allergies  Immunization History  Administered Date(s) Administered   Influenza, Seasonal, Injecte, Preservative Fre 12/23/2022   Influenza,inj,Quad PF,6+ Mos 12/29/2015, 02/06/2019, 02/25/2021, 03/01/2022   Pneumococcal Polysaccharide-23 09/26/2015   Tdap 12/29/2015    Tobacco History: Social History   Tobacco Use  Smoking Status Every Day   Current packs/day: 0.50   Average packs/day: 0.5 packs/day for 20.0 years (10.0 ttl pk-yrs)   Types: Cigarettes  Smokeless Tobacco Never   Ready to quit: No Counseling given: Yes   Outpatient Encounter Medications as of 04/22/2023  Medication Sig   Accu-Chek Softclix Lancets lancets Check up to twice daily.   atorvastatin  (LIPITOR) 80 MG tablet Take 1 tablet (80 mg total) by mouth daily.   Blood Glucose Monitoring Suppl (BLOOD GLUCOSE MONITOR SYSTEM) w/Device KIT Use as directed up to twice  daily.   clopidogrel  (PLAVIX ) 75 MG tablet Take 1 tablet (75 mg total) by mouth daily.   fluticasone  (FLONASE ) 50 MCG/ACT nasal spray Place 2 sprays into both nostrils daily.   gabapentin  (NEURONTIN ) 600 MG tablet Take 1 tablet (600 mg total) by mouth 2 (two) times daily.   Glucose Blood (BLOOD GLUCOSE TEST STRIPS) STRP Check up to twice daily.   Lancet Device MISC Check up to twice daily. May substitute to any manufacturer covered by patient's insurance.   lisinopril -hydrochlorothiazide  (ZESTORETIC ) 10-12.5 MG tablet TAKE 1 TABLET BY MOUTH DAILY.   metFORMIN  (GLUCOPHAGE -XR) 500 MG 24 hr tablet Take 1 tablet (500 mg total) by mouth 2 (two) times daily with a meal.   metoprolol  succinate (TOPROL -XL) 25 MG 24 hr tablet Take 1 tablet (25 mg total) by mouth daily.   oxyCODONE -acetaminophen  (PERCOCET/ROXICET) 5-325 MG tablet Take 1 tablet by mouth every 6 (six) hours as needed for pain for up to 5 days.   Semaglutide ,0.25 or 0.5MG /DOS, (OZEMPIC , 0.25 OR 0.5 MG/DOSE,) 2 MG/3ML SOPN Inject 0.25 mg weekly for 4 weeks, then increase to 0.5 mg weekly   sulfamethoxazole -trimethoprim  (BACTRIM  DS) 800-160 MG tablet Take 1 tablet by mouth every 12 (twelve) hours for 7 days. (Patient not taking: Reported on 04/22/2023)   No facility-administered encounter medications on file as of 04/22/2023.    Review of Systems  Review of Systems  Constitutional: Negative.   HENT: Negative.    Cardiovascular: Negative.   Gastrointestinal: Negative.   Allergic/Immunologic: Negative.   Neurological:  Negative.   Psychiatric/Behavioral: Negative.       Objective:   BP 101/66   Pulse 85   Temp (!) 97.3 F (36.3 C)   Wt 226 lb (102.5 kg)   SpO2 95%   BMI 31.52 kg/m   Wt Readings from Last 5 Encounters:  04/22/23 226 lb (102.5 kg)  03/25/23 231 lb 3.2 oz (104.9 kg)  03/08/23 228 lb 12.8 oz (103.8 kg)  02/24/23 226 lb (102.5 kg)  12/23/22 226 lb (102.5 kg)     Physical Exam Vitals and nursing note reviewed.   Constitutional:      General: He is not in acute distress.    Appearance: He is well-developed.  Cardiovascular:     Rate and Rhythm: Normal rate and regular rhythm.  Pulmonary:     Effort: Pulmonary effort is normal.     Breath sounds: Normal breath sounds.  Skin:    General: Skin is warm and dry.  Neurological:     Mental Status: He is alert and oriented to person, place, and time.       Assessment & Plan:   DM type 2, controlled, with complication (HCC) -     POCT glycosylated hemoglobin (Hb A1C)  Unilateral recurrent inguinal hernia without obstruction or gangrene  History of fracture of ankle     Return in about 3 months (around 07/21/2023).   Bascom GORMAN Borer, NP 04/22/2023

## 2023-04-24 ENCOUNTER — Encounter (HOSPITAL_COMMUNITY): Payer: Self-pay | Admitting: Urgent Care

## 2023-04-25 ENCOUNTER — Telehealth: Payer: Self-pay

## 2023-04-25 NOTE — Telephone Encounter (Signed)
 Aaron Kennedy was scheduled for surgery with Dr. Malvin on 05/11/2023. Received H&P paperwork from PCP and his A1c is 9.2. Per Dr. Malvin his A1c needs to be under 7.5 to have surgery. I notified [name] of this information. He will call us  back to reschedule once his A1c in lover than 7.5.

## 2023-04-25 NOTE — Telephone Encounter (Signed)
 Copied from CRM [id number]. Topic: Medical Record Request - Records Request >> Apr 22, 2023  4:04 PM Ivette P wrote: Reason for CRM: checking to make sure clearance was sent to the surgeon at [facility] Patient is requesting callback [phone]

## 2023-04-26 ENCOUNTER — Other Ambulatory Visit: Payer: Self-pay

## 2023-04-26 ENCOUNTER — Other Ambulatory Visit: Payer: Self-pay | Admitting: Nurse Practitioner

## 2023-04-26 ENCOUNTER — Telehealth: Payer: Self-pay

## 2023-04-26 MED ORDER — OXYCODONE-ACETAMINOPHEN 5-325 MG PO TABS
1.0000 | ORAL_TABLET | Freq: Four times a day (QID) | ORAL | 0 refills | Status: DC | PRN
  Filled 2023-04-28: qty 20, 5d supply, fill #0

## 2023-04-26 NOTE — Telephone Encounter (Signed)
 Copied from CRM [id number]. Topic: Clinical - Medication Refill >> Apr 21, 2023  3:14 PM [name] wrote: Most Recent Primary Care Visit:  Provider: OLEY BASCOM RAMAN  Department: [facility]  Visit Type: OFFICE VISIT  Date: 03/25/2023  Medication: oxyCODONE -acetaminophen  (PERCOCET/ROXICET) 5-325 MG tablet  Has the patient contacted their pharmacy? Yes  Is this the correct pharmacy for this prescription? Yes  This is the patient's preferred pharmacy:  [facility] [address] [place] [address] Phone: [phone] Fax: [phone]  Has the prescription been filled recently? Yes  Is the patient out of the medication? Yes  Has the patient been seen for an appointment in the last year OR does the patient have an upcoming appointment? Yes  Can we respond through MyChart? Yes  Agent: Please be advised that Rx refills may take up to 3 business days. We ask that you follow-up with your pharmacy.

## 2023-04-28 ENCOUNTER — Other Ambulatory Visit: Payer: Self-pay

## 2023-05-03 ENCOUNTER — Inpatient Hospital Stay: Admission: RE | Admit: 2023-05-03 | Payer: Medicaid Other | Source: Ambulatory Visit

## 2023-05-03 ENCOUNTER — Other Ambulatory Visit: Payer: Self-pay

## 2023-05-03 NOTE — Pre-Procedure Instructions (Signed)
 Surgical Instructions    Your procedure is scheduled on Wednesday, January 22.    Report to [facility] Main Entrance "A" at 1230 P.M., then check in with the Admitting office.  Call this number if you have problems the morning of surgery:  [phone]  If you have any questions prior to your surgery date call [phone]: Open Monday-Friday 8am-4pm If you experience any cold or flu symptoms such as cough, fever, chills, shortness of breath, etc. between now and your scheduled surgery, please notify us  at the above number.     Remember:  Do not eat after midnight the night before your surgery  You may drink clear liquids until 1130 AM the morning of your surgery.   Clear liquids allowed are: Water , Non-Citrus Juices (without pulp), Carbonated Beverages, Clear Tea, Black Coffee Only (NO MILK, CREAM OR POWDERED CREAMER of any kind), and Gatorade.  Patient Instructions  The day of surgery (if you have diabetes): Drink ONE (1) 12 oz G2 given to you in your pre admission testing appointment by 1130 AM the morning of surgery. Drink in one sitting. Do not sip.  This drink was given to you during your hospital  pre-op appointment visit.  Nothing else to drink after completing the  12 oz bottle of G2.         If you have questions, please contact your surgeon's office.     Take these medicines the morning of surgery with A SIP OF WATER :             atorvastatin  (LIPITOR)             gabapentin  (NEURONTIN )              metoprolol  succinate (TOPROL -XL)             oxyCODONE -acetaminophen  (PERCOCET/ROXICET) as needed   Hold Semaglutide  (OZEMPIC ) 7 days prior to your procedure. Last dose no later than 05/03/2023.   Hold clopidogrel  (PLAVIX ) 5 days prior to procedure. Last dose will be on 05/05/2023.    WHAT DO I DO ABOUT MY DIABETES MEDICATION?   DO NOT take oral diabetes medicines (pills) the morning of surgery. This includes Metformin . Last dose - 05/10/2023   HOW TO MANAGE  YOUR DIABETES BEFORE AND AFTER SURGERY  Why is it important to control my blood sugar before and after surgery? Improving blood sugar levels before and after surgery helps healing and can limit problems. A way of improving blood sugar control is eating a healthy diet by:  Eating less sugar and carbohydrates  Increasing activity/exercise  Talking with your doctor about reaching your blood sugar goals High blood sugars (greater than 180 mg/dL) can raise your risk of infections and slow your recovery, so you will need to focus on controlling your diabetes during the weeks before surgery. Make sure that the doctor who takes care of your diabetes knows about your planned surgery including the date and location.  How do I manage my blood sugar before surgery? Check your blood sugar at least 4 times a day, starting 2 days before surgery, to make sure that the level is not too high or low.  Check your blood sugar the morning of your surgery when you wake up and every 2 hours until you get to the Short Stay unit.  If your blood sugar is less than 70 mg/dL, you will need to treat for low blood sugar: Do not take insulin . Treat a low blood sugar (less than 70 mg/dL) with  cup of clear juice (cranberry or apple), 4 glucose tablets, OR glucose gel. Recheck blood sugar in 15 minutes after treatment (to make sure it is greater than 70 mg/dL). If your blood sugar is not greater than 70 mg/dL on recheck, call [phone] for further instructions. Report your blood sugar to the short stay nurse when you get to Short Stay.  If you are admitted to the hospital after surgery: Your blood sugar will be checked by the staff and you will probably be given insulin  after surgery (instead of oral diabetes medicines) to make sure you have good blood sugar levels. The goal for blood sugar control after surgery is 80-180 mg/dL.  As of today, STOP taking any Aspirin  (unless otherwise instructed by your surgeon) Aleve,  Naproxen, Ibuprofen , Motrin , Advil , Goody's, BC's, all herbal medications, fish oil, and all vitamins.                     Do NOT Smoke (Tobacco/Vaping) for 24 hours prior to your procedure.  If you use a CPAP at night, you may bring your mask/headgear for your overnight stay.   Contacts, glasses, piercing's, hearing aid's, dentures or partials may not be worn into surgery, please bring cases for these belongings.    For patients admitted to the hospital, discharge time will be determined by your treatment team.   Patients discharged the day of surgery will not be allowed to drive home, and someone needs to stay with them for 24 hours.  SURGICAL WAITING ROOM VISITATION Patients having surgery or a procedure may have no more than 2 support people in the waiting area - these visitors may rotate.   Children under the age of 64 must have an adult with them who is not the patient. If the patient needs to stay at the hospital during part of their recovery, the visitor guidelines for inpatient rooms apply. Pre-op nurse will coordinate an appropriate time for 1 support person to accompany patient in pre-op.  This support person may not rotate.   Please refer to the [facility] website for the visitor guidelines for Inpatients (after your surgery is over and you are in a regular room).    Special instructions:   [place]- Preparing For Surgery  Before surgery, you can play an important role. Because skin is not sterile, your skin needs to be as free of germs as possible. You can reduce the number of germs on your skin by washing with CHG (chlorahexidine gluconate) Soap before surgery.  CHG is an antiseptic cleaner which kills germs and bonds with the skin to continue killing germs even after washing.    Oral Hygiene is also important to reduce your risk of infection.  Remember - BRUSH YOUR TEETH THE MORNING OF SURGERY WITH YOUR REGULAR TOOTHPASTE  Please do not use if you have an allergy to CHG  or antibacterial soaps. If your skin becomes reddened/irritated stop using the CHG.  Do not shave (including legs and underarms) for at least 48 hours prior to first CHG shower. It is OK to shave your face.  Please follow these instructions carefully.   Shower the NIGHT BEFORE SURGERY and the MORNING OF SURGERY  If you chose to wash your hair, wash your hair first as usual with your normal shampoo.  After you shampoo, rinse your hair and body thoroughly to remove the shampoo.  Use CHG Soap as you would any other liquid soap. You can apply CHG directly to the skin and  wash gently with a scrungie or a clean washcloth.   Apply the CHG Soap to your body ONLY FROM THE NECK DOWN.  Do not use on open wounds or open sores. Avoid contact with your eyes, ears, mouth and genitals (private parts). Wash Face and genitals (private parts)  with your normal soap.   Wash thoroughly, paying special attention to the area where your surgery will be performed.  Thoroughly rinse your body with warm water  from the neck down.  DO NOT shower/wash with your normal soap after using and rinsing off the CHG Soap.  Pat yourself dry with a CLEAN TOWEL.  Wear CLEAN PAJAMAS to bed the night before surgery  Place CLEAN SHEETS on your bed the night before your surgery  DO NOT SLEEP WITH PETS.   Day of Surgery: Take a shower with CHG soap. Do not wear jewelry or makeup Do not wear lotions, powders, perfumes/colognes, or deodorant. Do not shave 48 hours prior to surgery.  Men may shave face and neck. Do not bring valuables to the hospital.  [facility] is not responsible for any belongings or valuables. Do not wear nail polish, gel polish, artificial nails, or any other type of covering on natural nails (fingers and toes) If you have artificial nails or gel coating that need to be removed by a nail salon, please have this removed prior to surgery. Artificial nails or gel coating may interfere with anesthesia's  ability to adequately monitor your vital signs. Wear Clean/Comfortable clothing the morning of surgery Remember to brush your teeth WITH YOUR REGULAR TOOTHPASTE.   Please read over the following fact sheets that you were given.    If you received a COVID test during your pre-op visit  it is requested that you wear a mask when out in public, stay away from anyone that may not be feeling well and notify your surgeon if you develop symptoms. If you have been in contact with anyone that has tested positive in the last 10 days please notify you surgeon.

## 2023-05-04 ENCOUNTER — Other Ambulatory Visit: Payer: Self-pay

## 2023-05-04 ENCOUNTER — Other Ambulatory Visit: Payer: Self-pay | Admitting: Nurse Practitioner

## 2023-05-04 ENCOUNTER — Encounter (HOSPITAL_COMMUNITY)
Admission: RE | Admit: 2023-05-04 | Discharge: 2023-05-04 | Discharge status: Home / Self Care | Payer: Medicaid Other | Source: Ambulatory Visit | Attending: General Surgery

## 2023-05-04 ENCOUNTER — Encounter (HOSPITAL_COMMUNITY): Payer: Self-pay

## 2023-05-04 VITALS — BP 135/59 | HR 75 | Temp 98.3°F | Resp 18 | Ht 71.0 in | Wt 229.1 lb

## 2023-05-04 DIAGNOSIS — Z01818 Encounter for other preprocedural examination: Secondary | ICD-10-CM | POA: Insufficient documentation

## 2023-05-04 DIAGNOSIS — R0683 Snoring: Secondary | ICD-10-CM

## 2023-05-04 HISTORY — DX: Peripheral vascular disease, unspecified: I73.9

## 2023-05-04 LAB — COMPREHENSIVE METABOLIC PANEL
ALT: 33 U/L (ref 0–44)
AST: 18 U/L (ref 15–41)
Albumin: 3.7 g/dL (ref 3.5–5.0)
Alkaline Phosphatase: 98 U/L (ref 38–126)
Anion gap: 7 (ref 5–15)
BUN: 9 mg/dL (ref 6–20)
CO2: 27 mmol/L (ref 22–32)
Calcium: 9.1 mg/dL (ref 8.9–10.3)
Chloride: 103 mmol/L (ref 98–111)
Creatinine, Ser: 0.99 mg/dL (ref 0.61–1.24)
GFR, Estimated: >60 mL/min (ref >60)
Glucose, Bld: 153 mg/dL — ABNORMAL HIGH (ref 70–99)
Potassium: 4.3 mmol/L (ref 3.5–5.1)
Sodium: 137 mmol/L (ref 135–145)
Total Bilirubin: 0.6 mg/dL (ref 0.0–1.2)
Total Protein: 6.5 g/dL (ref 6.5–8.1)

## 2023-05-04 LAB — GLUCOSE, CAPILLARY: Glucose-Capillary: 177 mg/dL — ABNORMAL HIGH (ref 70–99)

## 2023-05-04 LAB — CBC
HCT: 44.6 % (ref 39.0–52.0)
Hemoglobin: 15.6 g/dL (ref 13.0–17.0)
MCH: 33.5 pg (ref 26.0–34.0)
MCHC: 35 g/dL (ref 30.0–36.0)
MCV: 95.7 fL (ref 80.0–100.0)
Platelets: 213 10*3/uL (ref 150–400)
RBC: 4.66 MIL/uL (ref 4.22–5.81)
RDW: 12.7 % (ref 11.5–15.5)
WBC: 9.2 10*3/uL (ref 4.0–10.5)
nRBC: 0 % (ref 0.0–0.2)

## 2023-05-04 LAB — SURGICAL PCR SCREEN
MRSA, PCR: NEGATIVE
Staphylococcus aureus: NEGATIVE

## 2023-05-04 NOTE — Progress Notes (Signed)
 PCP - Abbey Hobby, NP Cardiologist - denies  PPM/ICD - denies Device Orders - na Rep Notified - na  Chest x-ray - na EKG - PAT, 05/04/2023 Stress Test -  ECHO - 03/19/2015 Cardiac Cath -   Sleep Study - denies CPAP - na  Type II diabetic, Blood sugar 177. States did not eat breakfast, took his Metformin  this morning.  Fasting Blood Sugar - 160-175 Checks Blood Sugar once or twice a week.  Metformin , instructed to hold DOS  Last dose of GLP1 agonist-  Hold Ozempic  7 days prior to surgery, last dose 05/03/2023  Blood Thinner Instructions: Plavix , hold 5 days prior to procedure, last dose 05/05/2023 Aspirin  Instructions: denies  ERAS Protcol - G2 until 1130  Anesthesia review: Yes. HTN, DM, stroke  Patient denies shortness of breath, fever, cough and chest pain at PAT appointment   All instructions explained to the patient, with a verbal understanding of the material. Patient agrees to go over the instructions while at home for a better understanding. Patient also instructed to self quarantine after being tested for COVID-19. The opportunity to ask questions was provided.
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
## 2023-05-06 ENCOUNTER — Encounter (HOSPITAL_COMMUNITY): Payer: Self-pay

## 2023-05-06 NOTE — Anesthesia Preprocedure Evaluation (Signed)
Anesthesia Evaluation  Patient identified by MRN, date of birth, ID band Patient awake    Reviewed: Allergy & Precautions, NPO status , Patient's Chart, lab work & pertinent test results, reviewed documented beta blocker date and time   Airway Mallampati: II  TM Distance: >3 FB     Dental no notable dental hx. (+) Dental Advisory Given, Teeth Intact   Pulmonary Current SmokerPatient did not abstain from smoking.   Pulmonary exam normal breath sounds clear to auscultation       Cardiovascular hypertension, Pt. on medications and Pt. 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   Abdominal  (+) + obese  Peds  Hematology Plavix therapy- last dose more than 7 days ago   Anesthesia Other Findings   Reproductive/Obstetrics                             Anesthesia Physical Anesthesia Plan  ASA: 3  Anesthesia Plan: General   Post-op Pain Management: Dilaudid IV, Tylenol PO (pre-op)* and Precedex   Induction:   PONV Risk Score and Plan: 3 and Treatment may vary due to age or medical condition, Midazolam, Ondansetron and Dexamethasone  Airway Management Planned: Oral ETT  Additional Equipment: None  Intra-op Plan:   Post-operative Plan: Extubation in OR  Informed Consent: I have reviewed the patients  History and Physical, chart, labs and discussed the procedure including the risks, benefits and alternatives for the proposed anesthesia with the patient or authorized representative who has indicated his/her understanding and acceptance.     Dental advisory given  Plan Discussed with: CRNA and Anesthesiologist  Anesthesia Plan Comments: (PAT note written 05/06/2023 by Shonna Chock, PA-C.  )       Anesthesia Quick Evaluation

## 2023-05-11 ENCOUNTER — Other Ambulatory Visit: Payer: Self-pay

## 2023-05-11 ENCOUNTER — Ambulatory Visit (HOSPITAL_BASED_OUTPATIENT_CLINIC_OR_DEPARTMENT_OTHER): Payer: Medicaid Other | Admitting: Anesthesiology

## 2023-05-11 ENCOUNTER — Encounter (HOSPITAL_COMMUNITY)
Admission: RE | Disposition: A | Discharge status: Home / Self Care | Payer: Self-pay | Source: Home / Self Care | Attending: General Surgery

## 2023-05-11 ENCOUNTER — Encounter (HOSPITAL_COMMUNITY): Payer: Self-pay | Admitting: General Surgery

## 2023-05-11 ENCOUNTER — Ambulatory Visit (HOSPITAL_COMMUNITY): Payer: Self-pay | Admitting: Vascular Surgery

## 2023-05-11 ENCOUNTER — Ambulatory Visit (HOSPITAL_COMMUNITY)
Admission: RE | Admit: 2023-05-11 | Discharge: 2023-05-11 | Disposition: A | Discharge status: Home / Self Care | Payer: Medicaid Other | Attending: General Surgery | Admitting: General Surgery

## 2023-05-11 ENCOUNTER — Ambulatory Visit: Admit: 2023-05-11 | Payer: Medicaid Other | Admitting: Podiatry

## 2023-05-11 DIAGNOSIS — E669 Obesity, unspecified: Secondary | ICD-10-CM | POA: Diagnosis not present

## 2023-05-11 DIAGNOSIS — E119 Type 2 diabetes mellitus without complications: Secondary | ICD-10-CM

## 2023-05-11 DIAGNOSIS — Z7902 Long term (current) use of antithrombotics/antiplatelets: Secondary | ICD-10-CM | POA: Diagnosis not present

## 2023-05-11 DIAGNOSIS — I69359 Hemiplegia and hemiparesis following cerebral infarction affecting unspecified side: Secondary | ICD-10-CM | POA: Diagnosis not present

## 2023-05-11 DIAGNOSIS — Z7985 Long-term (current) use of injectable non-insulin antidiabetic drugs: Secondary | ICD-10-CM | POA: Diagnosis not present

## 2023-05-11 DIAGNOSIS — Z9889 Other specified postprocedural states: Secondary | ICD-10-CM | POA: Diagnosis not present

## 2023-05-11 DIAGNOSIS — E1151 Type 2 diabetes mellitus with diabetic peripheral angiopathy without gangrene: Secondary | ICD-10-CM | POA: Insufficient documentation

## 2023-05-11 DIAGNOSIS — Z6831 Body mass index (BMI) 31.0-31.9, adult: Secondary | ICD-10-CM | POA: Insufficient documentation

## 2023-05-11 DIAGNOSIS — T8579XA Infection and inflammatory reaction due to other internal prosthetic devices, implants and grafts, initial encounter: Secondary | ICD-10-CM

## 2023-05-11 DIAGNOSIS — M069 Rheumatoid arthritis, unspecified: Secondary | ICD-10-CM | POA: Insufficient documentation

## 2023-05-11 DIAGNOSIS — Z7984 Long term (current) use of oral hypoglycemic drugs: Secondary | ICD-10-CM | POA: Diagnosis not present

## 2023-05-11 DIAGNOSIS — I10 Essential (primary) hypertension: Secondary | ICD-10-CM | POA: Insufficient documentation

## 2023-05-11 DIAGNOSIS — Z01818 Encounter for other preprocedural examination: Secondary | ICD-10-CM

## 2023-05-11 DIAGNOSIS — L02214 Cutaneous abscess of groin: Secondary | ICD-10-CM | POA: Diagnosis not present

## 2023-05-11 DIAGNOSIS — F17210 Nicotine dependence, cigarettes, uncomplicated: Secondary | ICD-10-CM | POA: Diagnosis not present

## 2023-05-11 DIAGNOSIS — Z79899 Other long term (current) drug therapy: Secondary | ICD-10-CM | POA: Diagnosis not present

## 2023-05-11 DIAGNOSIS — E785 Hyperlipidemia, unspecified: Secondary | ICD-10-CM | POA: Insufficient documentation

## 2023-05-11 DIAGNOSIS — I5040 Unspecified combined systolic (congestive) and diastolic (congestive) heart failure: Secondary | ICD-10-CM | POA: Diagnosis not present

## 2023-05-11 DIAGNOSIS — G40909 Epilepsy, unspecified, not intractable, without status epilepticus: Secondary | ICD-10-CM | POA: Diagnosis not present

## 2023-05-11 DIAGNOSIS — I110 Hypertensive heart disease with heart failure: Secondary | ICD-10-CM | POA: Diagnosis not present

## 2023-05-11 HISTORY — PX: ORCHIECTOMY: SHX2116

## 2023-05-11 LAB — GLUCOSE, CAPILLARY
Glucose-Capillary: 164 mg/dL — ABNORMAL HIGH (ref 70–99)
Glucose-Capillary: 136 mg/dL — ABNORMAL HIGH (ref 70–99)

## 2023-05-11 SURGERY — ORCHIECTOMY
Anesthesia: General | Laterality: Left

## 2023-05-11 SURGERY — ANKLE ARTHROSCOPY
Anesthesia: General | Site: Foot | Laterality: Right

## 2023-05-11 MED ORDER — FENTANYL CITRATE (PF) 100 MCG/2ML IJ SOLN
INTRAMUSCULAR | Status: AC
  Filled 2023-05-11: qty 2

## 2023-05-11 MED ORDER — ROCURONIUM BROMIDE 10 MG/ML (PF) SYRINGE
PREFILLED_SYRINGE | INTRAVENOUS | Status: AC
  Filled 2023-05-11: qty 10

## 2023-05-11 MED ORDER — SUGAMMADEX SODIUM 200 MG/2ML IV SOLN
INTRAVENOUS | Status: DC | PRN
  Administered 2023-05-11: 210 mg via INTRAVENOUS

## 2023-05-11 MED ORDER — 0.9 % SODIUM CHLORIDE (POUR BTL) OPTIME
TOPICAL | Status: DC | PRN
  Administered 2023-05-11: 1000 mL

## 2023-05-11 MED ORDER — LABETALOL HCL 5 MG/ML IV SOLN
INTRAVENOUS | Status: AC
  Filled 2023-05-11: qty 4

## 2023-05-11 MED ORDER — ORAL CARE MOUTH RINSE: 15.0000 mL | Freq: Once | OROMUCOSAL | Status: AC

## 2023-05-11 MED ORDER — PROPOFOL 10 MG/ML IV BOLUS
INTRAVENOUS | Status: AC
  Filled 2023-05-11: qty 20

## 2023-05-11 MED ORDER — HYDROMORPHONE HCL 1 MG/ML IJ SOLN
0.2500 mg | INTRAMUSCULAR | Status: DC | PRN
  Administered 2023-05-11 (×2): 0.5 mg via INTRAVENOUS

## 2023-05-11 MED ORDER — HYDROMORPHONE HCL 1 MG/ML IJ SOLN
INTRAMUSCULAR | Status: AC
  Administered 2023-05-11: 0.5 mg via INTRAVENOUS
  Filled 2023-05-11: qty 2

## 2023-05-11 MED ORDER — ONDANSETRON HCL 4 MG/2ML IJ SOLN
INTRAMUSCULAR | Status: AC
Start: 2023-05-11 — End: ?
  Filled 2023-05-11: qty 2

## 2023-05-11 MED ORDER — STERILE WATER FOR IRRIGATION IR SOLN
Status: DC | PRN
  Administered 2023-05-11: 500 mL

## 2023-05-11 MED ORDER — DEXAMETHASONE SODIUM PHOSPHATE 10 MG/ML IJ SOLN
INTRAMUSCULAR | Status: DC | PRN
  Administered 2023-05-11: 4 mg via INTRAVENOUS

## 2023-05-11 MED ORDER — CHLORHEXIDINE GLUCONATE CLOTH 2 % EX PADS: 6.0000 | MEDICATED_PAD | Freq: Once | CUTANEOUS | Status: DC

## 2023-05-11 MED ORDER — HYDROMORPHONE HCL 1 MG/ML IJ SOLN
INTRAMUSCULAR | Status: AC
  Administered 2023-05-11: 0.5 mg via INTRAVENOUS
  Filled 2023-05-11: qty 1

## 2023-05-11 MED ORDER — AMISULPRIDE (ANTIEMETIC) 5 MG/2ML IV SOLN: 10.0000 mg | Freq: Once | INTRAVENOUS | Status: DC | PRN

## 2023-05-11 MED ORDER — LIDOCAINE HCL (PF) 2 % IJ SOLN
INTRAMUSCULAR | Status: AC
  Filled 2023-05-11: qty 5

## 2023-05-11 MED ORDER — DEXAMETHASONE SODIUM PHOSPHATE 10 MG/ML IJ SOLN
INTRAMUSCULAR | Status: AC
  Filled 2023-05-11: qty 1

## 2023-05-11 MED ORDER — OXYCODONE HCL 5 MG PO TABS: 5.0000 mg | ORAL_TABLET | Freq: Once | ORAL | Status: AC | PRN

## 2023-05-11 MED ORDER — CHLORHEXIDINE GLUCONATE 0.12 % MT SOLN
15.0000 mL | Freq: Once | OROMUCOSAL | Status: AC
  Administered 2023-05-11: 15 mL via OROMUCOSAL

## 2023-05-11 MED ORDER — MIDAZOLAM HCL 2 MG/2ML IJ SOLN
INTRAMUSCULAR | Status: DC | PRN
  Administered 2023-05-11: 2 mg via INTRAVENOUS

## 2023-05-11 MED ORDER — PROPOFOL 10 MG/ML IV BOLUS
INTRAVENOUS | Status: DC | PRN
  Administered 2023-05-11: 180 mg via INTRAVENOUS

## 2023-05-11 MED ORDER — CEFAZOLIN SODIUM-DEXTROSE 2-4 GM/100ML-% IV SOLN
2.0000 g | INTRAVENOUS | Status: AC
  Administered 2023-05-11: 2 g via INTRAVENOUS
  Filled 2023-05-11: qty 100

## 2023-05-11 MED ORDER — ROCURONIUM BROMIDE 10 MG/ML (PF) SYRINGE
PREFILLED_SYRINGE | INTRAVENOUS | Status: DC | PRN
  Administered 2023-05-11: 70 mg via INTRAVENOUS

## 2023-05-11 MED ORDER — ENSURE PRE-SURGERY PO LIQD: 296.0000 mL | Freq: Once | ORAL | Status: DC

## 2023-05-11 MED ORDER — LIDOCAINE 2% (20 MG/ML) 5 ML SYRINGE
INTRAMUSCULAR | Status: DC | PRN
  Administered 2023-05-11: 80 mg via INTRAVENOUS

## 2023-05-11 MED ORDER — OXYCODONE HCL 5 MG/5ML PO SOLN: 5.0000 mg | Freq: Once | ORAL | Status: AC | PRN

## 2023-05-11 MED ORDER — LACTATED RINGERS IV SOLN: INTRAVENOUS | Status: DC

## 2023-05-11 MED ORDER — MIDAZOLAM HCL 2 MG/2ML IJ SOLN
INTRAMUSCULAR | Status: AC
  Filled 2023-05-11: qty 2

## 2023-05-11 MED ORDER — FENTANYL CITRATE (PF) 100 MCG/2ML IJ SOLN
INTRAMUSCULAR | Status: DC | PRN
  Administered 2023-05-11: 100 ug via INTRAVENOUS
  Administered 2023-05-11 (×3): 50 ug via INTRAVENOUS

## 2023-05-11 MED ORDER — LABETALOL HCL 5 MG/ML IV SOLN: 5.0000 mg | INTRAVENOUS | Status: DC | PRN

## 2023-05-11 MED ORDER — ACETAMINOPHEN 500 MG PO TABS
1000.0000 mg | ORAL_TABLET | ORAL | Status: AC
  Administered 2023-05-11: 1000 mg via ORAL
  Filled 2023-05-11: qty 2

## 2023-05-11 MED ORDER — OXYCODONE HCL 5 MG PO TABS
ORAL_TABLET | ORAL | Status: AC
  Administered 2023-05-11: 5 mg via ORAL
  Filled 2023-05-11: qty 1

## 2023-05-11 MED ORDER — ONDANSETRON HCL 4 MG/2ML IJ SOLN
INTRAMUSCULAR | Status: DC | PRN
  Administered 2023-05-11: 4 mg via INTRAVENOUS

## 2023-05-11 MED ORDER — INSULIN ASPART 100 UNIT/ML IJ SOLN: 0.0000 [IU] | INTRAMUSCULAR | Status: DC | PRN

## 2023-05-11 MED ORDER — OXYCODONE HCL 5 MG PO TABS
5.0000 mg | ORAL_TABLET | ORAL | 0 refills | Status: DC | PRN
  Filled 2023-05-11: qty 30, 5d supply, fill #0

## 2023-05-11 MED ORDER — ONDANSETRON HCL 4 MG/2ML IJ SOLN: 4.0000 mg | Freq: Once | INTRAMUSCULAR | Status: DC | PRN

## 2023-05-11 MED ORDER — BUPIVACAINE-EPINEPHRINE 0.25% -1:200000 IJ SOLN
INTRAMUSCULAR | Status: DC | PRN
  Administered 2023-05-11: 20 mL

## 2023-05-11 SURGICAL SUPPLY — 34 items
BAG COUNTER SPONGE SURGICOUNT (BAG) IMPLANT
BAG URINE DRAIN 2000ML AR STRL (UROLOGICAL SUPPLIES) IMPLANT
BLADE SURG 15 STRL LF DISP TIS (BLADE) IMPLANT
BLADE SURG SZ10 CARB STEEL (BLADE) ×1 IMPLANT
CHLORAPREP W/TINT 26 (MISCELLANEOUS) ×1 IMPLANT
DERMABOND ADVANCED .7 DNX12 (GAUZE/BANDAGES/DRESSINGS) ×1 IMPLANT
DRAIN PENROSE 0.5X18 (DRAIN) ×1 IMPLANT
DRAPE LAPAROSCOPIC ABDOMINAL (DRAPES) ×1 IMPLANT
ELECT REM PT RETURN 15FT ADLT (MISCELLANEOUS) ×1 IMPLANT
GAUZE 4X4 16PLY ~~LOC~~+RFID DBL (SPONGE) ×1 IMPLANT
GAUZE SPONGE 4X4 12PLY STRL (GAUZE/BANDAGES/DRESSINGS) IMPLANT
GLOVE BIO SURGEON STRL SZ7.5 (GLOVE) ×1 IMPLANT
GOWN STRL REUS W/ TWL XL LVL3 (GOWN DISPOSABLE) ×2 IMPLANT
KIT BASIN OR (CUSTOM PROCEDURE TRAY) ×1 IMPLANT
KIT TURNOVER KIT A (KITS) IMPLANT
MARKER SKIN DUAL TIP RULER LAB (MISCELLANEOUS) ×1 IMPLANT
NDL HYPO 22X1.5 SAFETY MO (MISCELLANEOUS) ×1 IMPLANT
NEEDLE HYPO 22X1.5 SAFETY MO (MISCELLANEOUS) ×1 IMPLANT
PACK BASIC VI WITH GOWN DISP (CUSTOM PROCEDURE TRAY) ×1 IMPLANT
PENCIL SMOKE EVACUATOR (MISCELLANEOUS) IMPLANT
SET IRRIG Y TYPE TUR BLADDER L (SET/KITS/TRAYS/PACK) ×1 IMPLANT
SPIKE FLUID TRANSFER (MISCELLANEOUS) ×1 IMPLANT
SUT MNCRL AB 4-0 PS2 18 (SUTURE) ×1 IMPLANT
SUT PDS AB 2-0 CT2 27 (SUTURE) IMPLANT
SUT PDS AB 4-0 SH 27 (SUTURE) IMPLANT
SUT PROLENE 2 0 SH DA (SUTURE) ×1 IMPLANT
SUT SILK 0 30XBRD TIE 6 (SUTURE) IMPLANT
SUT SILK 2-0 18XBRD TIE 12 (SUTURE) IMPLANT
SUT VIC AB 2-0 SH 27X BRD (SUTURE) ×2 IMPLANT
SUT VIC AB 3-0 SH 27XBRD (SUTURE) ×1 IMPLANT
SYR BULB IRRIG 60ML STRL (SYRINGE) ×1 IMPLANT
SYR CONTROL 10ML LL (SYRINGE) ×1 IMPLANT
TOWEL OR 17X26 10 PK STRL BLUE (TOWEL DISPOSABLE) ×1 IMPLANT
TRAY FOLEY MTR SLVR 16FR STAT (SET/KITS/TRAYS/PACK) ×1 IMPLANT

## 2023-05-11 NOTE — Anesthesia Procedure Notes (Signed)
Procedure Name: Intubation Date/Time: 05/11/2023 8:54 AM  Performed by: Nelle Don, CRNAPre-anesthesia Checklist: Patient identified, Emergency Drugs available, Suction available and Patient being monitored Patient Re-evaluated:Patient Re-evaluated prior to induction Oxygen Delivery Method: Circle system utilized Preoxygenation: Pre-oxygenation with 100% oxygen Induction Type: IV induction Ventilation: Mask ventilation without difficulty Laryngoscope Size: Mac and 4 Grade View: Grade I Tube type: Oral Tube size: 7.5 mm Number of attempts: 1 Airway Equipment and Method: Stylet Placement Confirmation: ETT inserted through vocal cords under direct vision, positive ETCO2 and breath sounds checked- equal and bilateral Secured at: 22 cm Tube secured with: Tape Dental Injury: Teeth and Oropharynx as per pre-operative assessment

## 2023-05-11 NOTE — Transfer of Care (Signed)
Immediate Anesthesia Transfer of Care Note  Patient: Aaron Kennedy  Procedure(s) Performed: LEFT REMOVAL OF INGUINAL MESH (Left)  Patient Location: PACU  Anesthesia Type:General  Level of Consciousness: drowsy  Airway & Oxygen Therapy: Patient Spontanous Breathing and Patient connected to face mask oxygen  Post-op Assessment: Report given to RN, Post -op Vital signs reviewed and stable, and Patient moving all extremities X 4  Post vital signs: Reviewed and stable  Last Vitals:  Vitals Value Taken Time  BP 165/96 05/11/23 1016  Temp    Pulse 69 05/11/23 1017  Resp 10 05/11/23 1017  SpO2 99 % 05/11/23 1017  Vitals shown include unfiled device data.  Last Pain:  Vitals:   05/11/23 0837  TempSrc:   PainSc: 8       Patients Stated Pain Goal: 3 (05/11/23 0837)  Complications: No notable events documented.

## 2023-05-11 NOTE — Anesthesia Postprocedure Evaluation (Signed)
Anesthesia Post Note  Patient: Aaron Kennedy  Procedure(s) Performed: LEFT REMOVAL OF INGUINAL MESH (Left)     Patient location during evaluation: PACU Anesthesia Type: General Level of consciousness: awake and alert and oriented Pain management: pain level controlled Vital Signs Assessment: post-procedure vital signs reviewed and stable Respiratory status: spontaneous breathing, nonlabored ventilation and respiratory function stable Cardiovascular status: blood pressure returned to baseline and stable Postop Assessment: no apparent nausea or vomiting Anesthetic complications: no   No notable events documented.  Last Vitals:  Vitals:   05/11/23 1100 05/11/23 1115  BP: (!) 162/102 (!) 145/101  Pulse: 75 82  Resp: 16 19  Temp:    SpO2: 95% 94%    Last Pain:  Vitals:   05/11/23 1115  TempSrc:   PainSc: 7                  Jimmie Rueter A.

## 2023-05-11 NOTE — Op Note (Signed)
05/11/2023  10:06 AM  PATIENT:  Aaron Kennedy  53 y.o. male  PRE-OPERATIVE DIAGNOSIS:  chronic draining sinus  POST-OPERATIVE DIAGNOSIS:  chronic draining sinus, infected mesh  PROCEDURE:  Procedure(s): LEFT GROIN EXPLORATION AND REMOVAL OF INGUINAL MESH (Left)  SURGEON:  Surgeons and Role:    Axel Filler, MD - Primary  ANESTHESIA:   local and general  EBL:  minimal   BLOOD ADMINISTERED:none  DRAINS: none   LOCAL MEDICATIONS USED:  BUPIVICAINE   SPECIMEN:  No Specimen  DISPOSITION OF SPECIMEN:  N/A  COUNTS:  YES  TOURNIQUET:  * No tourniquets in log *  DICTATION: .Dragon Dictation   Indication procedure: Patient is a 53 year old male who had a history of an open left inguinal hernia repair with mesh in approximate 2005.  Patient subsequently had significant abscesses develop thereafter.  Patient developed a chronic draining sinus.  Patient went MRI which.  Show mesh that was likely infected.  Patient was counseled and taken to the operating room for left groin exploration and explantation of mesh.  Findings: Patient with chronic draining sinus that led down to an area of mass just superior to his left spermatic cord.  There were numerous Prolene sutures that were removed.  This all appeared to be superior to the spermatic cord.  As much of the mesh that could be removed was removed in its entirety.  There is approximately a 3 x 5 cm area that was removed.  the area of the subcutaneous tissue was closed and the skin was left open to prevent any further infection.  Details of procedure: After the patient was consented he was taken back to the OR and placed in supine position with bilateral SCDs in place.  Under general endotracheal intubation.  Then prepped and draped in standard fashion.  A timeout was called all facts verified. At this time an elliptical incision was made over the area of the chronic draining sinus.  Cautery is used to maintain hemostasis and  dissection was taken down to the external bleak.  This was incised.  I was able to visualize the left spermatic cord.  This was retracted out of the way.  At this time it was easy to visualize some Prolene's.  These were removed.  Mesh was underlying this area.  There was some woody granulation tissue that was in the space as well.  The Prolene's were removed and the majority of the mesh just anterior to the internal oblique was removed.  The mesh did appear to be chronically infected.  There were some minimal areas in deep to the left internal inguinal canal that had some fibers of mesh however no overt plug could be seen.  At this time the area is irrigated out with sterile saline.  The internal oblique did have to be incised laterally.  This was reapproximate using 4-0 PDS in a standard running fashion.  The external bleak was then reapproximated on top of this using a 2-0 PDS in a standard running fashion.  Scarpa's tissue was reapproximated using a 2-0 Vicryl in a standard running fashion.  A nerve block was placed 2 cm medial and superior to the left ASIS.  The skin was left open and packed with Kerlix.  This was dressed with 4 x 4's and tape.  Patient tolerated the procedure well was taken to the recovery in stable condition.  PLAN OF CARE: Discharge to home after PACU  PATIENT DISPOSITION:  PACU - hemodynamically stable.  Delay start of Pharmacological VTE agent (>24hrs) due to surgical blood loss or risk of bleeding: not applicable

## 2023-05-11 NOTE — H&P (Signed)
 Chief Complaint: RECHECK       History of Present Illness: Aaron Kennedy is a 53 y.o. male who is seen today for chronic left inguinal draining sinus.  Patient has had a history of a previous open left inguinal hernia repair in 2005.  He states that thereafter he had an infection.  He states that since that time he had several infections that required drainage in the operating room.  I did do an incision and drainage of that area while fixing his incarcerated right inguinal hernia laparoscopically.   There has been some mesh removed.  Patient recently underwent MRI.  This did appear to contain what I feel is a plug in the left inguinal canal.  I do believe this is the nidus of the chronic drainage.  He has had multiple attempts for silver nitrate, I&D, with no long-lasting results.           Review of Systems: A complete review of systems was obtained from the patient.  I have reviewed this information and discussed as appropriate with the patient.  See HPI as well for other ROS.   ROS      Medical History: Past Medical History      Past Medical History:  Diagnosis Date   Anxiety     DVT (deep venous thrombosis) (CMS/HHS-HCC)     History of stroke     Hypertension     Seizures (CMS/HHS-HCC)          Problem List     Patient Active Problem List  Diagnosis   DM type 2, controlled, with complication (CMS/HHS-HCC)   Anxiety   Cerebral infarction, unspecified (CMS/HHS-HCC)   Chronic pain of right knee   Combined systolic and diastolic cardiac dysfunction   HLD (hyperlipidemia)   HTN (hypertension)   PAD (peripheral artery disease) (CMS-HCC)   S/P hernia repair        Past Surgical History       Past Surgical History:  Procedure Laterality Date   HERNIA REPAIR            Allergies  No Known Allergies     Medications Ordered Prior to Encounter        Current Outpatient Medications on File Prior to Visit  Medication Sig Dispense Refill   atorvastatin  (LIPITOR) 80 MG tablet Take 80 mg by mouth once daily       metFORMIN (GLUCOPHAGE) 500 MG tablet         metFORMIN (GLUCOPHAGE-XR) 500 MG XR tablet Take 500 mg by mouth 2 (two) times daily with meals       metoprolol succinate (TOPROL-XL) 25 MG XL tablet Take 1 tablet by mouth once daily       OZEMPIC 0.25 mg or 0.5 mg (2 mg/3 mL) pen injector Inject 0.25 mg weekly for 4 weeks, then increase to 0.5 mg weekly       gabapentin (NEURONTIN) 600 MG tablet Take 600 mg by mouth 2 (two) times daily       gabapentin (NEURONTIN) 600 MG tablet Take 600 mg by mouth 2 (two) times daily       lisinopriL-hydroCHLOROthiazide (ZESTORETIC) 10-12.5 mg tablet Take 1 tablet by mouth once daily       metoprolol succinate (TOPROL-XL) 25 MG XL tablet Take 1 tablet by mouth once daily        No current facility-administered medications on file prior to visit.        Family History  History reviewed.  No pertinent family history.      Tobacco Use History  Social History       Tobacco Use  Smoking Status Unknown  Smokeless Tobacco Not on file        Social History  Social History        Socioeconomic History   Marital status: Single  Tobacco Use   Smoking status: Unknown  Vaping Use   Vaping status: Unknown  Substance and Sexual Activity   Alcohol use: Defer   Drug use: Defer        Objective:         Vitals:    03/29/23 0913  PainSc:   5    There is no height or weight on file to calculate BMI.   Physical Exam  PE:   Constitutional: No acute distress, conversant, appears states age. Eyes: Anicteric sclerae, moist conjunctiva, no lid lag Lungs: Clear to auscultation bilaterally, normal respiratory effort CV: regular rate and rhythm, no murmurs, no peripheral edema, pedal pulses 2+ GI: Soft, no masses or hepatosplenomegaly, non-tender to palpation, left inguinal draining tract, clear-yellow drainage Skin: No rashes, palpation reveals normal turgor Psychiatric: appropriate judgment  and insight, oriented to person, place, and time       Assessment and Plan:    Assessment Diagnoses and all orders for this visit:   Wound of left groin, sequela       I had a long discussion with the patient in regards to his chronic draining sinus.  I believe this is likely secondary to a retained piece of mesh plug that is in the inguinal canal.  He has been dealing with this since 2019.  He states that has not gotten any better.   1.  I discussed with the patient it may be beneficial to proceed to the operating for a open removal of the mesh plug.  I discussed with him this likely will require a left orchiectomy.  I discussed with him that this will also cause a recurrence of a left inguinal hernia.  I discussed with him we will not place any mesh at this point as this likely will be infected.  We can also fix this laparoscopically in the future if he does have a hernia.  I discussed with him if it is possible we will try to not do an orchiectomy if we can remove the mesh without any compromise to the blood supply.  Patient agreed to this plan. 2.  I will give him a prescription for Bactrim to see if this will dry up the drainage, and also pain medication.       No follow-ups on file.     Axel Filler, MD

## 2023-05-11 NOTE — Discharge Instructions (Signed)

## 2023-05-12 ENCOUNTER — Encounter (HOSPITAL_COMMUNITY): Payer: Self-pay | Admitting: General Surgery

## 2023-05-12 ENCOUNTER — Other Ambulatory Visit: Payer: Self-pay

## 2023-05-17 ENCOUNTER — Other Ambulatory Visit: Payer: Self-pay | Admitting: Pharmacist

## 2023-05-17 ENCOUNTER — Encounter: Payer: Medicaid Other | Admitting: Podiatry

## 2023-05-23 ENCOUNTER — Other Ambulatory Visit (HOSPITAL_COMMUNITY): Payer: Self-pay

## 2023-05-23 ENCOUNTER — Other Ambulatory Visit: Payer: Self-pay

## 2023-05-23 MED ORDER — OXYCODONE HCL 5 MG PO TABS
5.0000 mg | ORAL_TABLET | ORAL | 0 refills | Status: DC | PRN
  Filled 2023-05-23: qty 20, 4d supply, fill #0

## 2023-06-05 ENCOUNTER — Other Ambulatory Visit: Payer: Self-pay

## 2023-06-05 DIAGNOSIS — E118 Type 2 diabetes mellitus with unspecified complications: Secondary | ICD-10-CM

## 2023-06-05 NOTE — Progress Notes (Unsigned)
 06/05/2023 Name: MELQUIADES KOVAR MRN: [id number] DOB: 09-04-1970  Chief Complaint  Patient presents with   Medication Management    Aaron Kennedy is a 53 y.o. year old male who presented for a telephone visit.   They were referred to the pharmacist by their PCP for assistance in managing diabetes and hyperlipidemia.    Subjective:  Patient reports doing well today. He held his Ozempic for his procedure on 1/22, but has since restarted and is tolerating well. He needs to get A1c to 7.1% for foot surgery.   Care Team: Primary Care Provider: Ivonne Andrew, NP ; Next Scheduled Visit: 06/23/23  Medication Access/Adherence  Current Pharmacy:  [facility] [address] [place] [address] Phone: [phone] Fax: [phone]   Patient reports barriers to adherence - he forgets to take PM medications, and struggles to take all his medications in the AM because he does not always eat breakfast Patient denies transportation barriers to taking his medications. Patient denies cost barriers to taking his medications  Diabetes:  Current medications: metformin XR 500 mg PO BID, semaglutide (Ozempic) 0.5 mg subcutaneous weekly (has been back on Ozempic for 3 weeks) - takes on Saturday.   Reports that he often forgets metformin XR PM dose. It is easier for him to remember to take medications in the AM - but he is not always able to eat in the morning.   Denies AE with Ozempic. Reports that he is eating smaller portion sizes and he has less of an appetite. He just picked up a new pen.  Current glucose readings: Checked the other day - saw 215 ~midday (but this was improved from 260-280) Using blood glucose meter; testing 1-2 times daily - he does not have his glucometer with him today to review values.  Patient denies hypoglycemic s/sx including dizziness, shakiness, sweating. Patient denies hyperglycemic symptoms  including polyuria, polydipsia, polyphagia, nocturia, neuropathy. Endorses blurry vision - recently had an eye appointment.  Current meal patterns (2 meals/day):  - Breakfast: boiled eggs, sausage, egg, and biscuit - often picks something up from [employer]. - Supper: grilled/broiled shrimp or fish, home fries, broccoli, pork chops + green beans + red potatoes  - Snacks: cucumber with vinegar, watermelon  - Drinks: coffee with creamer, regular Pepsi (1 in the morning, 1 at night), glass of milk. Patient does not like diet sodas. He does not drink much water throughout the day. He has replaced 2 of his sodas with zero-sugar Gatorade.   Current physical activity: yard work - Not working right now because of recent stroke and current hernia    Hypertension:  Current medications: metoprolol succinate 25 mg daily, lisinopril/hydrochlorothiazide 10/12.5 mg daily  Hyperlipidemia/ASCVD Risk Reduction  Current lipid lowering medications: atorvastatin 80 mg daily  - Patient taking with food in the evening- but reports that he frequently forgets to take - Patient reported some GI upset with atorvastatin at first but was advised by doctor to take with food. This helped and patient reports doing okay with statin now  Antiplatelet regimen: clopidogrel 75 mg daily   ASCVD History: PAD, CVA  Family History: stroke (sister), heart attack (brother) Risk Factors: HLD, HTN, DM  The ASCVD Risk score (Arnett DK, et al., 2019) failed to calculate for the following reasons:   Risk score cannot be calculated because patient has a medical history suggesting prior/existing ASCVD    Objective:  Lab Results  Component Value Date  HGBA1C 9.2 (A) 04/22/2023    Lab Results  Component Value Date   CREATININE 0.99 05/04/2023   BUN 9 05/04/2023   NA 137 05/04/2023   K 4.3 05/04/2023   CL 103 05/04/2023   CO2 27 05/04/2023    Lab Results  Component Value Date   CHOL 195 09/09/2022   HDL 30 (L)  09/09/2022   LDLCALC 146 (H) 09/09/2022   TRIG 105 09/09/2022   CHOLHDL 6.5 (H) 09/09/2022   BP Readings from Last 3 Encounters:  05/11/23 (!) 126/93  05/04/23 (!) 135/59  04/22/23 101/66    Medications Reviewed Today   Medications were not reviewed in this encounter     Assessment/Plan:   Diabetes: - Currently uncontrolled based on last A1c 9.2% above goal < 7%. Patient is tolerating GLP-1RA well and would like to continue titrating the dose after he finishes his current supply. Patient is a great GLP-1RA candidate given ASCVD and BMI 31. We discussed strategies today for improving adherence to metformin. Will attempt to increase metformin to 1000 mg PO BID at follow-up. Pt may benefit from SGLT2i in the future, but would prefer to initiate once A1c < 9%.  - Reviewed long term cardiovascular and renal outcomes of uncontrolled blood sugar - Reviewed goal A1c, goal fasting, and goal 2 hour post prandial glucose - Reviewed dietary modifications including: reducing sugary beverage intake (set a goal of eliminating Pepsi and replacing it with sugar-free Gatorade or flavored water), focusing on meals with more protein and vegetables than carbohydrates, and combing protein with carbs to spike blood glucose less.  - Recommend to consolidate metformin XR 500 mg twice daily to metformin XR 1000 mg daily for adherence. Patient to start setting an alarm on his phone as a reminder to take his oral medications. States that he will try to start eating breakfast since it is easier for him to remember his medications in the AM.  - Recommend to continue Ozempic 0.5 mg weekly for 4 weeks, then increase to 1 mg weekly - Counseled patient on Ozempic adverse effects, cost with Medicaid, and administration.  - Recommend to check glucose in the morning while fasting and two-hours after a meal each day.   Hypertension: - Currently controlled based on most recent BP readings from office visits  - Reviewed long  term cardiovascular and renal outcomes of uncontrolled blood pressure - Reviewed appropriate blood pressure monitoring technique and reviewed goal blood pressure. Recommended to check home blood pressure and heart rate periodically - Recommend to continue metoprolol succinate 25 mg daily and lisinopril/hydrochlorothiazide 10/12.5 mg daily  Hyperlipidemia/ASCVD Risk Reduction: - Currently uncontrolled based on most recent LDL of 146 in May 2024. LDL goal <55  - Reviewed long term complications of uncontrolled cholesterol - Recommend to continue atorvastatin 80 mg daily with food  - Recommend repeating lipid panel at next PCP visit in March. Could consider Repatha if LDL remains elevated   Follow Up Plan: PCP 06/23/23, Pharmacist telephone 07/17/23  Nils Pyle, PharmD PGY1 Pharmacy Resident

## 2023-06-05 NOTE — Progress Notes (Unsigned)
 Attempted to contact patient for scheduled appointment for medication management. Left HIPAA compliant message for patient to return my call at their convenience. Will reach out via MyChart as well.  Nils Pyle, PharmD PGY1 Pharmacy Resident

## 2023-06-06 ENCOUNTER — Other Ambulatory Visit: Payer: Self-pay

## 2023-06-06 MED ORDER — SEMAGLUTIDE (1 MG/DOSE) 4 MG/3ML ~~LOC~~ SOPN
1.0000 mg | PEN_INJECTOR | SUBCUTANEOUS | 6 refills | Status: DC
Start: 2023-06-06 — End: 2024-02-06
  Filled 2023-06-06 – 2023-06-30 (×2): qty 3, 28d supply, fill #0
  Filled 2023-07-25: qty 3, 28d supply, fill #1
  Filled 2023-08-19: qty 3, 28d supply, fill #2
  Filled 2023-10-04: qty 3, 28d supply, fill #3
  Filled 2023-10-31: qty 3, 28d supply, fill #4
  Filled 2023-12-06: qty 3, 28d supply, fill #5
  Filled 2023-12-30: qty 3, 28d supply, fill #6

## 2023-06-06 MED ORDER — METFORMIN HCL ER 500 MG PO TB24
1000.0000 mg | ORAL_TABLET | Freq: Every day | ORAL | 1 refills | Status: DC
  Filled 2023-06-06 – 2023-08-19 (×2): qty 180, 90d supply, fill #0
  Filled 2023-12-06: qty 180, 90d supply, fill #1

## 2023-06-06 NOTE — Progress Notes (Unsigned)
 [name]  ASSESSMENT / PLAN: Aaron Kennedy is a 53 y.o. 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  Recommend:  Abstinence from all tobacco products. Blood glucose control with goal A1c < 7%. Blood pressure control with goal blood pressure < 140/90 mmHg. Lipid reduction therapy with goal LDL-C <100 mg/dL  Aspirin 81mg  PO QD.  Atorvastatin 40-80mg  PO QD (or other "high intensity" statin therapy). Daily walking  Return to care in 3 months with ankle-brachial index to monitor progress with walking regimen.   CHIEF COMPLAINT: Calf cramping with walking  HISTORY OF PRESENT ILLNESS: Aaron Kennedy is a 53 y.o. male referred to clinic for evaluation of cramping discomfort in bilateral calves with walking.  This will occur when he walks about 50 yards.  The patient reports he cannot get through shopping in a large store for cramping discomfort starts.  Rest reliably relieves the pain.  He does not have symptoms typical of ischemic rest pain.  He has no sores on his feet.  He healed his recent foot surgery.  VASCULAR SURGICAL HISTORY: none  VASCULAR RISK FACTORS: Positive history of stroke / transient ischemic attack. Negative history of coronary artery disease.  Positive history of diabetes mellitus. Last A1c 8.2. Positive history of smoking. + actively smoking. Positive history of hypertension.  Negative history of chronic kidney disease.   Negative history of chronic obstructive pulmonary disease.  FUNCTIONAL STATUS: ECOG performance status: (2) Ambulatory and capable of self care, unable to carry out work activity, up and about > 50% or waking hours Ambulatory status: Ambulatory within the community with limits  [name] 1 AND 3 YEAR INDEX Male (2pts) 75-79 or 80-84 (2pts) >84 (3pts) Dependence in toileting (1pt) Partial or full dependence in dressing (1pt) History of  malignant neoplasm (2pts) CHF (3pts) COPD (1pts) CKD (3pts)  0-3 pts 6% 1 year mortality ; 21% 3 year mortality 4-5 pts 12% 1 year mortality ; 36% 3 year mortality >5 pts 21% 1 year mortality; 54% 3 year mortality   Past Medical History:  Diagnosis Date   Anxiety    Chronic pain of right knee    DM type 2, controlled, with complication (HCC) 08/26/2021   Hypertension    Insomnia 01/2019   PAD (peripheral artery disease) (HCC)    Stroke [facility])     Past Surgical History:  Procedure Laterality Date   HERNIA REPAIR     INCISION AND DRAINAGE ABSCESS Left 07/21/2017   Procedure: INCISION AND DRAINAGE INGUINAL HERNIA ABSCESS;  Surgeon: Emelia Loron, MD;  Location: [facility] OR;  Service: General;  Laterality: Left;   INGUINAL HERNIA REPAIR Bilateral 05/07/2017   Procedure: LAPAROSCOPIC RIGHT  INGUINAL HERNIA REPAIR AND  OPEN LEFT   INGUINAL EXPLORATION;  Surgeon: Axel Filler, MD;  Location: [facility] OR;  Service: General;  Laterality: Bilateral;   INSERTION OF MESH Bilateral 05/07/2017   Procedure: INSERTION OF MESH;  Surgeon: Axel Filler, MD;  Location: [facility] OR;  Service: General;  Laterality: Bilateral;   LEG SURGERY     ORCHIECTOMY Left 05/11/2023   Procedure: LEFT REMOVAL OF INGUINAL MESH;  Surgeon: Axel Filler, MD;  Location: [facility] ORS;  Service: General;  Laterality: Left;    Family History  Problem Relation Age of Onset   Stroke Brother        64s   Heart attack Sister        38s    Social History   Socioeconomic  History   Marital status: Single    Spouse name: Not on file   Number of children: Not on file   Years of education: Not on file   Highest education level: Not on file  Occupational History   Not on file  Tobacco Use   Smoking status: Every Day    Current packs/day: 0.50    Average packs/day: 0.5 packs/day for 20.0 years (10.0 ttl pk-yrs)    Types: Cigarettes   Smokeless tobacco: Never  Vaping Use   Vaping status: Never Used  Substance and Sexual  Activity   Alcohol use: Yes   Drug use: Yes    Types: Marijuana   Sexual activity: Not Currently  Other Topics Concern   Not on file  Social History Narrative   Not on file   Social Drivers of Health   Financial Resource Strain: Not on file  Food Insecurity: Not on file  Transportation Needs: Not on file  Physical Activity: Not on file  Stress: Not on file  Social Connections: Not on file  Intimate Partner Violence: Not on file    No Known Allergies  Current Outpatient Medications  Medication Sig Dispense Refill   Accu-Chek Softclix Lancets lancets Check up to twice daily. 100 each 3   atorvastatin (LIPITOR) 80 MG tablet Take 1 tablet (80 mg total) by mouth daily. 90 tablet 3   Blood Glucose Monitoring Suppl (BLOOD GLUCOSE MONITOR SYSTEM) w/Device KIT Use as directed up to twice daily. 1 kit 0   clopidogrel (PLAVIX) 75 MG tablet Take 1 tablet (75 mg total) by mouth daily. 90 tablet 3   fluticasone (FLONASE) 50 MCG/ACT nasal spray Place 2 sprays into both nostrils daily. (Patient not taking: Reported on 05/02/2023) 16 g 6   gabapentin (NEURONTIN) 600 MG tablet Take 1 tablet (600 mg total) by mouth 2 (two) times daily. 60 tablet 2   Glucose Blood (BLOOD GLUCOSE TEST STRIPS) STRP Check up to twice daily. 100 strip 3   Lancet Device MISC Check up to twice daily. May substitute to any manufacturer covered by patient's insurance. 1 each 0   lisinopril-hydrochlorothiazide (ZESTORETIC) 10-12.5 MG tablet TAKE 1 TABLET BY MOUTH DAILY. 90 tablet 3   metFORMIN (GLUCOPHAGE-XR) 500 MG 24 hr tablet Take 2 tablets (1,000 mg total) by mouth daily with breakfast. 180 tablet 1   metoprolol succinate (TOPROL-XL) 25 MG 24 hr tablet Take 1 tablet (25 mg total) by mouth daily. 90 tablet 3   oxyCODONE (OXY IR/ROXICODONE) 5 MG immediate release tablet Take 1 tablet (5 mg total) by mouth every 4 hours as needed for pain. 20 tablet 0   oxyCODONE-acetaminophen (PERCOCET/ROXICET) 5-325 MG tablet Take 1 tablet  by mouth every 6 (six) hours as needed for pain for up to 5 days. 20 tablet 0   Semaglutide, 1 MG/DOSE, 4 MG/3ML SOPN Inject 1 mg as directed once a week. 3 mL 6   sulfamethoxazole-trimethoprim (BACTRIM DS) 800-160 MG tablet Take 1 tablet by mouth every 12 (twelve) hours for 7 days. (Patient not taking: Reported on 04/22/2023) 14 tablet 0   No current facility-administered medications for this visit.    PHYSICAL EXAM There were no vitals filed for this visit.   Middle aged man in no distress Regular rate and rhythm Unlabored breathing No palpable femoral pulses No palpable popliteal pulses No palpable pedal pulses No ulcers on the feet  PERTINENT LABORATORY AND RADIOLOGIC DATA  Most recent CBC    Latest Ref Rng & Units 05/04/2023  9:10 AM 03/25/2023   10:19 AM 09/09/2022    9:22 AM  CBC  WBC 4.0 - 10.5 K/uL 9.2  8.6  8.2   Hemoglobin 13.0 - 17.0 g/dL 16.1  09.6  04.5   Hematocrit 39.0 - 52.0 % 44.6  46.4  47.2   Platelets 150 - 400 K/uL 213  198  187      Most recent CMP    Latest Ref Rng & Units 05/04/2023    9:10 AM 03/25/2023   10:19 AM 09/09/2022    9:22 AM  CMP  Glucose 70 - 99 mg/dL 409  811  914   BUN 6 - 20 mg/dL 9  9  15    Creatinine 0.61 - 1.24 mg/dL 7.82  9.56  2.13   Sodium 135 - 145 mmol/L 137  137  135   Potassium 3.5 - 5.1 mmol/L 4.3  4.4  4.5   Chloride 98 - 111 mmol/L 103  102  101   CO2 22 - 32 mmol/L 27  21  22    Calcium 8.9 - 10.3 mg/dL 9.1  8.8  8.9   Total Protein 6.5 - 8.1 g/dL 6.5  6.3  6.5   Total Bilirubin 0.0 - 1.2 mg/dL 0.6  0.4  0.4   Alkaline Phos 38 - 126 U/L 98  146  118   AST 15 - 41 U/L 18  10  12    ALT 0 - 44 U/L 33  20  26     Renal function CrCl cannot be calculated (Patient's most recent lab result is older than the maximum 21 days allowed.).  Hemoglobin A1C (%)  Date Value  04/22/2023 9.2 (A)   HbA1c, POC (prediabetic range) (%)  Date Value  11/26/2021 7.0 (A)   HbA1c, POC (controlled diabetic range) (%)  Date Value   03/25/2023 9.3 (A)    LDL Cholesterol (Calc)  Date Value Ref Range Status  12/24/2016 125 (H) mg/dL (calc) Final    Comment:    Reference range: <100 . Desirable range <100 mg/dL for primary prevention;   <70 mg/dL for patients with CHD or diabetic patients  with > or = 2 CHD risk factors. Marland Kitchen LDL-C is now calculated using the Martin-Hopkins  calculation, which is a validated novel method providing  better accuracy than the Friedewald equation in the  estimation of LDL-C.  Horald Pollen et al. Lenox Ahr. 0865;784(69): 2061-2068  (http://education.[employer].com/faq/FAQ164)    LDL Chol Calc (NIH)  Date Value Ref Range Status  09/09/2022 146 (H) 0 - 99 mg/dL Final     +-------+-----------+-----------+------------+------------+  ABI/TBIToday's ABIToday's TBIPrevious ABIPrevious TBI  +-------+-----------+-----------+------------+------------+  Right 0.82       0.46                                 +-------+-----------+-----------+------------+------------+  Left  0.73       0.29                                 +-------+-----------+-----------+------------+------------+   Rande Brunt. Lenell Antu, MD [facility] Phone Number: [phone] 06/06/2023 2:56 PM   Total time spent on preparing this encounter including chart review, data review, collecting history, examining the patient, coordinating care for this new patient, 60 minutes.  Portions of this report may have been transcribed using voice recognition software.  Every effort has been made to ensure accuracy; however, inadvertent computerized transcription errors may still be present.

## 2023-06-07 ENCOUNTER — Ambulatory Visit (INDEPENDENT_AMBULATORY_CARE_PROVIDER_SITE_OTHER): Payer: Medicaid Other

## 2023-06-07 ENCOUNTER — Ambulatory Visit: Payer: Medicaid Other | Admitting: Vascular Surgery

## 2023-06-07 ENCOUNTER — Encounter: Payer: Self-pay | Admitting: Vascular Surgery

## 2023-06-07 VITALS — BP 132/95 | HR 79 | Ht 71.0 in

## 2023-06-07 DIAGNOSIS — I739 Peripheral vascular disease, unspecified: Secondary | ICD-10-CM | POA: Diagnosis not present

## 2023-06-07 DIAGNOSIS — I70213 Atherosclerosis of native arteries of extremities with intermittent claudication, bilateral legs: Secondary | ICD-10-CM | POA: Diagnosis not present

## 2023-06-07 LAB — VAS US ABI WITH/WO TBI
Left ABI: 0.7
Right ABI: 0.74

## 2023-06-08 ENCOUNTER — Other Ambulatory Visit (HOSPITAL_COMMUNITY): Payer: Self-pay

## 2023-06-08 ENCOUNTER — Other Ambulatory Visit: Payer: Self-pay

## 2023-06-09 ENCOUNTER — Other Ambulatory Visit (HOSPITAL_COMMUNITY): Payer: Self-pay

## 2023-06-09 MED ORDER — OXYCODONE HCL 5 MG PO TABS
5.0000 mg | ORAL_TABLET | Freq: Three times a day (TID) | ORAL | 0 refills | Status: DC | PRN
  Filled 2023-06-09: qty 20, 7d supply, fill #0

## 2023-06-23 ENCOUNTER — Ambulatory Visit: Payer: Self-pay | Admitting: Nurse Practitioner

## 2023-06-23 ENCOUNTER — Encounter: Payer: Self-pay | Admitting: Nurse Practitioner

## 2023-06-23 VITALS — BP 131/83 | HR 85 | Temp 97.4°F | Wt 226.6 lb

## 2023-06-23 DIAGNOSIS — E782 Mixed hyperlipidemia: Secondary | ICD-10-CM

## 2023-06-23 DIAGNOSIS — M25551 Pain in right hip: Secondary | ICD-10-CM

## 2023-06-23 DIAGNOSIS — E559 Vitamin D deficiency, unspecified: Secondary | ICD-10-CM

## 2023-06-23 DIAGNOSIS — M25552 Pain in left hip: Secondary | ICD-10-CM | POA: Diagnosis not present

## 2023-06-23 DIAGNOSIS — E118 Type 2 diabetes mellitus with unspecified complications: Secondary | ICD-10-CM | POA: Diagnosis not present

## 2023-06-23 LAB — POCT GLYCOSYLATED HEMOGLOBIN (HGB A1C): Hemoglobin A1C: 7.9 % — AB (ref 4.0–5.6)

## 2023-06-23 MED ORDER — KETOROLAC TROMETHAMINE 60 MG/2ML IM SOLN
60.0000 mg | Freq: Once | INTRAMUSCULAR | Status: AC
  Administered 2023-06-23: 60 mg via INTRAMUSCULAR

## 2023-06-23 NOTE — Patient Instructions (Signed)
 1. DM type 2, controlled, with complication (HCC) (Primary)  - POCT glycosylated hemoglobin (Hb A1C) - CBC - Comprehensive metabolic panel - Lipid Panel  2. Mixed hyperlipidemia  - CBC - Comprehensive metabolic panel - Lipid Panel

## 2023-06-23 NOTE — Progress Notes (Addendum)
 Subjective   Patient ID: Aaron Kennedy, male    DOB: Jun 05, 1970, 53 y.o.   MRN: [id number]  Chief Complaint  Patient presents with   Diabetes    My numbers have went down per patient     Referring provider: Ivonne Andrew, NP  Aaron Kennedy is a 53 y.o. male with Past Medical History: No date: Anxiety No date: Chronic pain of right knee 08/26/2021: DM type 2, controlled, with complication (HCC) No date: Hypertension 01/2019: Insomnia No date: PAD (peripheral artery disease) (HCC) No date: Stroke [facility])   HPI  Patient presents today for surgical clearance.  He does have a history of diabetes and A1c in office today is 7.9.  Noted concern for delayed healing with uncontrolled diabetes.  Patient is currently now on Ozempic which should help control A1c.  Tolerating ozempic well. He has only been on this for the past month now. Denies f/c/s, n/v/d, hemoptysis, PND, leg swelling. Denies chest pain or edema.  Patient is requesting toradol injection today for hip pain.    No Known Allergies  Immunization History  Administered Date(s) Administered   Influenza, Seasonal, Injecte, Preservative Fre 12/23/2022   Influenza,inj,Quad PF,6+ Mos 12/29/2015, 02/06/2019, 02/25/2021, 03/01/2022   Pneumococcal Polysaccharide-23 09/26/2015   Tdap 12/29/2015    Tobacco History: Social History   Tobacco Use  Smoking Status Every Day   Current packs/day: 0.50   Average packs/day: 0.5 packs/day for 20.0 years (10.0 ttl pk-yrs)   Types: Cigarettes  Smokeless Tobacco Never   Ready to quit: Yes Counseling given: Yes   Outpatient Encounter Medications as of 06/23/2023  Medication Sig   Accu-Chek Softclix Lancets lancets Check up to twice daily.   atorvastatin (LIPITOR) 80 MG tablet Take 1 tablet (80 mg total) by mouth daily.   Blood Glucose Monitoring Suppl (BLOOD GLUCOSE MONITOR SYSTEM) w/Device KIT Use as directed up to twice daily.   clopidogrel (PLAVIX) 75 MG tablet Take 1  tablet (75 mg total) by mouth daily.   fluticasone (FLONASE) 50 MCG/ACT nasal spray Place 2 sprays into both nostrils daily.   gabapentin (NEURONTIN) 600 MG tablet Take 1 tablet (600 mg total) by mouth 2 (two) times daily.   Glucose Blood (BLOOD GLUCOSE TEST STRIPS) STRP Check up to twice daily.   Lancet Device MISC Check up to twice daily. May substitute to any manufacturer covered by patient's insurance.   lisinopril-hydrochlorothiazide (ZESTORETIC) 10-12.5 MG tablet TAKE 1 TABLET BY MOUTH DAILY.   metFORMIN (GLUCOPHAGE-XR) 500 MG 24 hr tablet Take 2 tablets (1,000 mg total) by mouth daily with breakfast.   metoprolol succinate (TOPROL-XL) 25 MG 24 hr tablet Take 1 tablet (25 mg total) by mouth daily.   oxyCODONE (OXY IR/ROXICODONE) 5 MG immediate release tablet Take 1 tablet (5 mg total) by mouth every 8 (eight) hours as needed for severe pain.   oxyCODONE-acetaminophen (PERCOCET/ROXICET) 5-325 MG tablet Take 1 tablet by mouth every 6 (six) hours as needed for pain for up to 5 days.   Semaglutide, 1 MG/DOSE, 4 MG/3ML SOPN Inject 1 mg as directed once a week.   sulfamethoxazole-trimethoprim (BACTRIM DS) 800-160 MG tablet Take 1 tablet by mouth every 12 (twelve) hours for 7 days. (Patient not taking: Reported on 06/23/2023)   Facility-Administered Encounter Medications as of 06/23/2023  Medication   ketorolac (TORADOL) injection 60 mg    Review of Systems  Review of Systems  Constitutional: Negative.   HENT: Negative.    Cardiovascular: Negative.   Gastrointestinal: Negative.  Allergic/Immunologic: Negative.   Neurological: Negative.   Psychiatric/Behavioral: Negative.       Objective:   BP 131/83   Pulse 85   Temp (!) 97.4 F (36.3 C) (Oral)   Wt 226 lb 9.6 oz (102.8 kg)   SpO2 100%   BMI 31.60 kg/m   Wt Readings from Last 5 Encounters:  06/23/23 226 lb 9.6 oz (102.8 kg)  05/11/23 229 lb 1.6 oz (103.9 kg)  05/04/23 229 lb 1.6 oz (103.9 kg)  04/22/23 226 lb (102.5 kg)   03/25/23 231 lb 3.2 oz (104.9 kg)     Physical Exam Vitals and nursing note reviewed.  Constitutional:      General: He is not in acute distress.    Appearance: He is well-developed.  Cardiovascular:     Rate and Rhythm: Normal rate and regular rhythm.  Pulmonary:     Effort: Pulmonary effort is normal.     Breath sounds: Normal breath sounds.  Skin:    General: Skin is warm and dry.  Neurological:     Mental Status: He is alert and oriented to person, place, and time.       Assessment & Plan:   DM type 2, controlled, with complication (HCC) -     POCT glycosylated hemoglobin (Hb A1C) -     CBC -     Comprehensive metabolic panel -     Lipid panel  Mixed hyperlipidemia -     CBC -     Comprehensive metabolic panel -     Lipid panel  Vitamin D deficiency -     VITAMIN D 25 Hydroxy (Vit-D Deficiency, Fractures)  Bilateral hip pain -     Ketorolac Tromethamine     Return in about 3 months (around 09/23/2023).     Ivonne Andrew, NP 06/23/2023

## 2023-06-23 NOTE — Addendum Note (Signed)
 Addended by: Ivonne Andrew on: 06/23/2023 09:50 AM   Modules accepted: Orders

## 2023-06-24 ENCOUNTER — Other Ambulatory Visit: Payer: Self-pay

## 2023-06-24 ENCOUNTER — Other Ambulatory Visit: Payer: Self-pay | Admitting: Nurse Practitioner

## 2023-06-24 LAB — COMPREHENSIVE METABOLIC PANEL
ALT: 35 IU/L (ref 0–44)
AST: 17 IU/L (ref 0–40)
Albumin: 4.6 g/dL (ref 3.8–4.9)
Alkaline Phosphatase: 136 IU/L — ABNORMAL HIGH (ref 44–121)
BUN/Creatinine Ratio: 9 (ref 9–20)
BUN: 8 mg/dL (ref 6–24)
Bilirubin Total: 0.3 mg/dL (ref 0.0–1.2)
CO2: 20 mmol/L (ref 20–29)
Calcium: 9.3 mg/dL (ref 8.7–10.2)
Chloride: 101 mmol/L (ref 96–106)
Creatinine, Ser: 0.86 mg/dL (ref 0.76–1.27)
Globulin, Total: 2.2 g/dL (ref 1.5–4.5)
Glucose: 155 mg/dL — ABNORMAL HIGH (ref 70–99)
Potassium: 4.2 mmol/L (ref 3.5–5.2)
Sodium: 136 mmol/L (ref 134–144)
Total Protein: 6.8 g/dL (ref 6.0–8.5)
eGFR: 104 mL/min/{1.73_m2} (ref >59)

## 2023-06-24 LAB — CBC
Hematocrit: 49.5 % (ref 37.5–51.0)
Hemoglobin: 17.1 g/dL (ref 13.0–17.7)
MCH: 34.5 pg — ABNORMAL HIGH (ref 26.6–33.0)
MCHC: 34.5 g/dL (ref 31.5–35.7)
MCV: 100 fL — ABNORMAL HIGH (ref 79–97)
Platelets: 217 10*3/uL (ref 150–450)
RBC: 4.96 x10E6/uL (ref 4.14–5.80)
RDW: 13.3 % (ref 11.6–15.4)
WBC: 9.3 10*3/uL (ref 3.4–10.8)

## 2023-06-24 LAB — LIPID PANEL
Chol/HDL Ratio: 6.9 ratio — ABNORMAL HIGH (ref 0.0–5.0)
Cholesterol, Total: 199 mg/dL (ref 100–199)
HDL: 29 mg/dL — ABNORMAL LOW (ref >39)
LDL Chol Calc (NIH): 146 mg/dL — ABNORMAL HIGH (ref 0–99)
Triglycerides: 132 mg/dL (ref 0–149)
VLDL Cholesterol Cal: 24 mg/dL (ref 5–40)

## 2023-06-24 LAB — VITAMIN D 25 HYDROXY (VIT D DEFICIENCY, FRACTURES): Vit D, 25-Hydroxy: 24.6 ng/mL — ABNORMAL LOW (ref 30.0–100.0)

## 2023-06-24 MED ORDER — VITAMIN D (ERGOCALCIFEROL) 1.25 MG (50000 UNIT) PO CAPS
50000.0000 [IU] | ORAL_CAPSULE | ORAL | 2 refills | Status: DC
  Filled 2023-06-24: qty 5, 35d supply, fill #0
  Filled 2023-07-25: qty 5, 35d supply, fill #1
  Filled 2023-09-05 – 2023-10-04 (×2): qty 5, 35d supply, fill #2

## 2023-06-29 NOTE — Progress Notes (Unsigned)
 07/01/23- 52 yoM for sleep evaluation courtesy of Angus Seller, NP with concern of snoring Medical problem list includes HTN, DM2, PAD, Hx CVA Tobacco Use Epworth score-6 Body weight today-224 lbs Discussed the use of AI scribe software for clinical note transcription with the patient, who gave verbal consent to proceed.  History of Present Illness   He was referred for concern of witnessed snoring which raises question of sleep apnea in context of stroke history. His girlfriend isn't here today to elaborate. He admits dozing off at times, watching TV.  No ENT surgery other than repair of nasal laceration. Has family hx of OSA.   The patient, a long-term smoker, presents with respiratory issues, which he attributes to his smoking habit. He reports having phlegm, particularly at night and in the morning, which he manages by expectorating. He denies having severe chest colds or other respiratory illnesses, except for bronchitis when he was younger. He is concerned about his lung function and asked me to assess. He agrees to CXR and PFT.  The patient also has a history of stroke in 2016, which has affected his right side and balance, making it difficult for him to perform certain tasks such as putting on underwear. 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  The patient also reports taking Klonopin 2 mg, not prescribed to him, to help with sleep. He reports sleeping soundly through the night with this medication. He has a history of seizures, which occurred before his stroke. He was previously on Xanax and amitriptyline, but stopped due to side effects including memory issues and dental problems.  The patient also has a history of physical injuries, including a shattered foot from playing basketball and a cut on the nose from a childhood accident. He is due for foot  surgery and has recently had hernia surgery. He also expresses concern about his dental health, which he believes has been affected by his previous use of amitriptyline.     Prior to Admission medications   Medication Sig Start Date End Date Taking? Authorizing Provider  Accu-Chek Softclix Lancets lancets Check up to twice daily. 01/27/23  Yes Ivonne Andrew, NP  atorvastatin (LIPITOR) 80 MG tablet Take 1 tablet (80 mg total) by mouth daily. 01/27/23  Yes Ivonne Andrew, NP  Blood Glucose Monitoring Suppl (BLOOD GLUCOSE MONITOR SYSTEM) w/Device KIT Use as directed up to twice daily. 01/27/23  Yes Ivonne Andrew, NP  clopidogrel (PLAVIX) 75 MG tablet Take 1 tablet (75 mg total) by mouth daily. 01/27/23  Yes Ivonne Andrew, NP  fluticasone (FLONASE) 50 MCG/ACT nasal spray Place 2 sprays into both nostrils daily. 06/10/22  Yes Ivonne Andrew, NP  Glucose Blood (BLOOD GLUCOSE TEST STRIPS) STRP Check up to twice daily. 01/27/23  Yes Ivonne Andrew, NP  Lancet Device MISC Check up to twice daily. May substitute to any manufacturer covered by patient's insurance. 01/27/23  Yes Ivonne Andrew, NP  lisinopril-hydrochlorothiazide (ZESTORETIC) 10-12.5 MG tablet TAKE 1 TABLET BY MOUTH DAILY. 01/27/23  Yes Ivonne Andrew, NP  metFORMIN (GLUCOPHAGE-XR) 500 MG 24 hr tablet Take 2 tablets (1,000 mg total) by mouth daily with breakfast. 06/06/23  Yes Ivonne Andrew, NP  metoprolol succinate (TOPROL-XL) 25 MG 24 hr tablet Take 1 tablet (25 mg total) by mouth daily. 01/27/23  Yes Ivonne Andrew,  NP  oxyCODONE (OXY IR/ROXICODONE) 5 MG immediate release tablet Take 1 tablet (5 mg total) by mouth every 8 (eight) hours as needed for severe pain. 06/09/23  Yes   oxyCODONE-acetaminophen (PERCOCET/ROXICET) 5-325 MG tablet Take 1 tablet by mouth every 6 (six) hours as needed for pain for up to 5 days. 04/28/23  Yes Ivonne Andrew, NP  Semaglutide, 1 MG/DOSE, 4 MG/3ML SOPN Inject 1 mg as directed once a week.  06/06/23  Yes Ivonne Andrew, NP  Vitamin D, Ergocalciferol, (DRISDOL) 1.25 MG (50000 UNIT) CAPS capsule Take 1 capsule (50,000 Units total) by mouth every 7 (seven) days. 06/24/23  Yes Ivonne Andrew, NP  gabapentin (NEURONTIN) 600 MG tablet Take 1 tablet (600 mg total) by mouth 2 (two) times daily. 02/24/23 06/23/23  Ivonne Andrew, NP   Past Medical History:  Diagnosis Date   Anxiety    Chronic pain of right knee    DM type 2, controlled, with complication (HCC) 08/26/2021   Hypertension    Insomnia 01/2019   PAD (peripheral artery disease) (HCC)    Stroke [facility])    Past Surgical History:  Procedure Laterality Date   HERNIA REPAIR     INCISION AND DRAINAGE ABSCESS Left 07/21/2017   Procedure: INCISION AND DRAINAGE INGUINAL HERNIA ABSCESS;  Surgeon: Emelia Loron, MD;  Location: [facility] OR;  Service: General;  Laterality: Left;   INGUINAL HERNIA REPAIR Bilateral 05/07/2017   Procedure: LAPAROSCOPIC RIGHT  INGUINAL HERNIA REPAIR AND  OPEN LEFT   INGUINAL EXPLORATION;  Surgeon: Axel Filler, MD;  Location: [facility] OR;  Service: General;  Laterality: Bilateral;   INSERTION OF MESH Bilateral 05/07/2017   Procedure: INSERTION OF MESH;  Surgeon: Axel Filler, MD;  Location: [facility] OR;  Service: General;  Laterality: Bilateral;   LEG SURGERY     ORCHIECTOMY Left 05/11/2023   Procedure: LEFT REMOVAL OF INGUINAL MESH;  Surgeon: Axel Filler, MD;  Location: [facility] ORS;  Service: General;  Laterality: Left;   Family History  Problem Relation Age of Onset   Stroke Brother        13s   Heart attack Sister        67s   Social History   Socioeconomic History   Marital status: Single    Spouse name: Not on file   Number of children: Not on file   Years of education: Not on file   Highest education level: Not on file  Occupational History   Not on file  Tobacco Use   Smoking status: Every Day    Current packs/day: 0.50    Average packs/day: 0.5 packs/day for 20.0 years (10.0 ttl pk-yrs)     Types: Cigarettes   Smokeless tobacco: Never  Vaping Use   Vaping status: Never Used  Substance and Sexual Activity   Alcohol use: Yes   Drug use: Yes    Types: Marijuana   Sexual activity: Not Currently  Other Topics Concern   Not on file  Social History Narrative   Not on file   Social Drivers of Health   Financial Resource Strain: Not on file  Food Insecurity: No Food Insecurity (06/23/2023)   Hunger Vital Sign    Worried About Running Out of Food in the Last Year: Never true    Ran Out of Food in the Last Year: Never true  Transportation Needs: No Transportation Needs (06/23/2023)   PRAPARE - Administrator, Civil Service (Medical): No    Lack of Transportation (Non-Medical):  No  Physical Activity: Not on file  Stress: Not on file  Social Connections: Not on file  Intimate Partner Violence: Not on file   ROS-see HPI   + = positive Constitutional:    weight loss, night sweats, fevers, chills, fatigue, lassitude. HEENT:    headaches, difficulty swallowing, tooth/dental problems, sore throat,       sneezing, itching, ear ache, nasal congestion, post nasal drip, snoring CV:    chest pain, orthopnea, PND, swelling in lower extremities, anasarca,                                   dizziness, palpitations Resp:   shortness of breath with exertion or at rest.                +productive cough,   non-productive cough, coughing up of blood.              change in color of mucus.  wheezing.   Skin:    rash or lesions. GI:  No-   heartburn, indigestion, abdominal pain, nausea, vomiting, diarrhea,                 change in bowel habits, loss of appetite GU: dysuria, change in color of urine, no urgency or frequency.   flank pain. MS:   joint pain, stiffness, decreased range of motion, back pain. Neuro-     nothing unusual Psych:  change in mood or affect.  depression or anxiety.   memory loss.  OBJ- Physical Exam General- Alert, Oriented, Affect-appropriate, Distress- none  acute, +muscular Skin- rash-none, lesions- none, excoriation- none Lymphadenopathy- none Head- atraumatic            Eyes- Gross vision intact, PERRLA, conjunctivae and secretions clear            Ears- Hearing, canals-normal            Nose- Clear, no-Septal dev, mucus, polyps, erosion, perforation             Throat- Mallampati III , mucosa clear , drainage- none, tonsils- atrophic Neck- flexible , trachea midline, no stridor , thyroid nl, carotid no bruit Chest - symmetrical excursion , unlabored           Heart/CV- RRR , no murmur , no gallop  , no rub, nl s1 s2                           - JVD- none , edema- none, stasis changes- none, varices- none           Lung- clear to P&A, wheeze- none, cough+light , dullness-none, rub- none           Chest wall-  Abd-  Br/ Gen/ Rectal- Not done, not indicated Extrem- cyanosis- none, clubbing, none, atrophy- none, strength- nl Neuro- grossly intact to observation Assessment and Plan    Sleep Apnea Discussed potential sleep apnea and importance of diagnosis to prevent complications. He is aware of CPAP therapy and open to evaluation. - Order home sleep test to evaluate for sleep apnea. - Instruct him to call two weeks after the test for results.  Chronic Obstructive Pulmonary Disease (COPD) Discussed smoking's impact on lung health and need for lung function evaluation. - Order chest X-ray to assess lung condition. - Schedule pulmonary function test to evaluate lung function.  Ischemic Stroke Experienced ischemic stroke  in 2016 with right-sided weakness and balance issues. Hypertension may have expedited clot passage.  Seizure Disorder Seizures in mid-30s, treated with Xanax and amitriptyline, discontinued due to side effects.  Diabetes Management Concerned about A1c levels, started on Ozempic with positive response. Making dietary changes to manage diabetes.  Dental Health Concerns Significant dental issues attributed to long-term  amitriptyline use he blames for "taking the fluoride out of my mouth". Aware of potential health impacts.

## 2023-06-30 ENCOUNTER — Other Ambulatory Visit: Payer: Self-pay

## 2023-07-01 ENCOUNTER — Encounter: Payer: Self-pay | Admitting: Internal Medicine

## 2023-07-01 ENCOUNTER — Ambulatory Visit: Admitting: Internal Medicine

## 2023-07-01 VITALS — BP 130/84 | HR 79 | Temp 97.9°F | Ht 71.0 in | Wt 224.2 lb

## 2023-07-01 DIAGNOSIS — I6330 Cerebral infarction due to thrombosis of unspecified cerebral artery: Secondary | ICD-10-CM | POA: Diagnosis not present

## 2023-07-01 DIAGNOSIS — R0683 Snoring: Secondary | ICD-10-CM

## 2023-07-01 DIAGNOSIS — J42 Unspecified chronic bronchitis: Secondary | ICD-10-CM | POA: Diagnosis not present

## 2023-07-01 NOTE — Patient Instructions (Signed)
 Order- schedule home sleep test    dx snoring, hx CVA  Please call us about 2 weeks after your sleep test for results and recommendations  Order- schedule PFT dx chronic bronchitis

## 2023-07-11 ENCOUNTER — Other Ambulatory Visit: Payer: Self-pay

## 2023-07-11 ENCOUNTER — Telehealth: Payer: Self-pay | Admitting: Podiatry

## 2023-07-11 NOTE — Telephone Encounter (Signed)
 Pt called wanting to r/s his surgery with Dr Annamary Rummage at [facility]. I told pt I would give him call back tomorrow to get scheduled as I am in training and wanted to make sure I was doing it correctly.

## 2023-07-13 ENCOUNTER — Ambulatory Visit: Admitting: Podiatry

## 2023-07-17 ENCOUNTER — Other Ambulatory Visit: Payer: Self-pay

## 2023-07-17 NOTE — Progress Notes (Signed)
 07/17/2023 Name: Aaron Kennedy MRN: [id number] DOB: November 27, 1970  Chief Complaint  Patient presents with   Diabetes   Hyperlipidemia    Aaron Kennedy is a 53 y.o. year old male who presented for a telephone visit.   They were referred to the pharmacist by their PCP for assistance in managing diabetes and hyperlipidemia. PMH includes T2DM, PAD, Stroke (2016), tobacco use, HFmrEF.  Subjective:  Patient reports doing well today. He still has not been able to schedule his foot surgery with A1C > 7.5%, but is happy with improvement. He recently started Ozempic 1 mg - felt a little more nauseous the first week, but second week without complaints. He continues to feel down about residual deficits after his stroke - balance issues, difficulty getting back to work, but he is motivated to prevent another event.   Care Team: Primary Care Provider: Ivonne Andrew, NP ; Next Scheduled Visit: 10/07/23  Medication Access/Adherence  Current Pharmacy:  [facility] [address] [place] [address] Phone: [phone] Fax: [phone]   Patient reports barriers to adherence - he forgets to take PM medications (atorvastatin), and struggles to take all his medications in the AM because he does not always eat breakfast. He is only taking clopidogrel every other day due to bleeding/bruising. He states he did not take metformin yesterday because he read something about it damaging the pancreas.  Patient denies transportation barriers to taking his medications. Patient denies cost barriers to taking his medications  Diabetes:  Current medications: metformin XR 500 mg PO BID, semaglutide (Ozempic) 1 mg subcutaneous weekly (has taken 2 doses so far) - takes on Saturday.   Since increasing Ozempic, had one week of increased nausea, but no complaints the following week.   Current glucose readings: Checked the other day - random BG of  140 mg/dL Using blood glucose meter to test BG a couple times per week. Does not have glucometer to review values.   Patient denies hypoglycemic s/sx including dizziness, shakiness, sweating. Patient denies hyperglycemic symptoms including polyuria, polydipsia, polyphagia, nocturia, neuropathy. Endorses blurry vision - recently had an eye appointment.  Current meal patterns (2 meals/day): trying to avoid fried foods - Breakfast: boiled eggs, sausage, egg, and biscuit - often picks something up from [employer]. - Supper: grilled/broiled shrimp or fish, home fries, broccoli, pork chops + green beans + red potatoes  - Snacks: cucumber with vinegar, watermelon  - Drinks: coffee with creamer, has tried to cut back on regular Pepsi - but still drinking some. Trying to replace at least 2 of his daily sodas with zero-sugar gatorade. He does not drink much water throughout the day.   Hypertension:  Current medications: metoprolol succinate 25 mg daily, lisinopril/hydrochlorothiazide 10/12.5 mg daily  Hyperlipidemia/ASCVD Risk Reduction  Current lipid lowering medications: atorvastatin 80 mg daily  - Patient reported some GI upset with atorvastatin at first but was advised by doctor to take with food, which helped. Despite our conversation about adherence at our last visit - patient continues to take statin very intermittently.   Antiplatelet regimen: clopidogrel 75 mg daily - taking every other day due to "thin skin" and frequent bleeding/bruising with daily administration. Recommended to take daily.   ASCVD History: PAD, CVA  Family History: stroke (sister), heart attack (brother) Risk Factors: HLD, HTN, DM, premature ASCVD  The ASCVD Risk score (Arnett DK, et al., 2019) failed to calculate for the following reasons:   Risk score  cannot be calculated because patient has a medical history suggesting prior/existing ASCVD    Objective:  BP Readings from Last 3 Encounters:  07/01/23 130/84   06/23/23 131/83  06/07/23 (!) 132/95    Lab Results  Component Value Date   HGBA1C 7.9 (A) 06/23/2023    Lab Results  Component Value Date   CREATININE 0.86 06/23/2023   BUN 8 06/23/2023   NA 136 06/23/2023   K 4.2 06/23/2023   CL 101 06/23/2023   CO2 20 06/23/2023    Lab Results  Component Value Date   CHOL 199 06/23/2023   HDL 29 (L) 06/23/2023   LDLCALC 146 (H) 06/23/2023   TRIG 132 06/23/2023   CHOLHDL 6.9 (H) 06/23/2023   BP Readings from Last 3 Encounters:  07/01/23 130/84  06/23/23 131/83  06/07/23 (!) 132/95    Medications Reviewed Today     Reviewed by Particia Lather, RPH (Pharmacist) on 07/17/23 at 1457  Med List Status: <None>   Medication Order Taking? Sig Documenting Provider Last Dose Status Informant  Accu-Chek Softclix Lancets lancets [id number]  Check up to twice daily. Ivonne Andrew, NP  Active Self  atorvastatin (LIPITOR) 80 MG tablet [id number] No Take 1 tablet (80 mg total) by mouth daily.  Patient not taking: Reported on 07/17/2023   Ivonne Andrew, NP Not Taking Active Self           Med Note Alycia Patten Jul 17, 2023  2:56 PM) Taking very intermittently  Blood Glucose Monitoring Suppl (BLOOD GLUCOSE MONITOR SYSTEM) w/Device KIT [id number]  Use as directed up to twice daily. Ivonne Andrew, NP  Active Self  clopidogrel (PLAVIX) 75 MG tablet [id number] Yes Take 1 tablet (75 mg total) by mouth daily. Ivonne Andrew, NP Taking Active Self           Med Note Alycia Patten Jul 17, 2023  2:57 PM) Taking every other day because of bothersome cuts/bruises  fluticasone (FLONASE) 50 MCG/ACT nasal spray [id number]  Place 2 sprays into both nostrils daily. Ivonne Andrew, NP  Active Self           Med Note Reuben Likes May 02, 2023  2:13 PM)    gabapentin (NEURONTIN) 600 MG tablet [id number]  Take 1 tablet (600 mg total) by mouth 2 (two) times daily. Ivonne Andrew, NP  Expired 06/23/23 2359 Self  Glucose Blood (BLOOD  GLUCOSE TEST STRIPS) STRP [id number]  Check up to twice daily. Ivonne Andrew, NP  Active Self  Lancet Device MISC [id number]  Check up to twice daily. May substitute to any manufacturer covered by patient's insurance. Ivonne Andrew, NP  Active Self  lisinopril-hydrochlorothiazide (ZESTORETIC) 10-12.5 MG tablet [id number] Yes TAKE 1 TABLET BY MOUTH DAILY. Ivonne Andrew, NP Taking Active Self  metFORMIN (GLUCOPHAGE-XR) 500 MG 24 hr tablet [id number] Yes Take 2 tablets (1,000 mg total) by mouth daily with breakfast. Ivonne Andrew, NP Taking Active   metoprolol succinate (TOPROL-XL) 25 MG 24 hr tablet [id number] Yes Take 1 tablet (25 mg total) by mouth daily. Ivonne Andrew, NP Taking Active Self  oxyCODONE (OXY IR/ROXICODONE) 5 MG immediate release tablet [id number]  Take 1 tablet (5 mg total) by mouth every 8 (eight) hours as needed for severe pain.   Active   oxyCODONE-acetaminophen (PERCOCET/ROXICET) 5-325 MG tablet [id number]  Take 1 tablet by mouth every 6 (six) hours as needed for  pain for up to 5 days. Ivonne Andrew, NP  Active Self  Semaglutide, 1 MG/DOSE, 4 MG/3ML SOPN [id number] Yes Inject 1 mg as directed once a week. Ivonne Andrew, NP Taking Active   Vitamin D, Ergocalciferol, (DRISDOL) 1.25 MG (50000 UNIT) CAPS capsule [id number] Yes Take 1 capsule (50,000 Units total) by mouth every 7 (seven) days. Ivonne Andrew, NP Taking Active             Assessment/Plan:   Diabetes: - Currently uncontrolled based on last A1c 7.9%, but improved from 9.2%. Congratulated patient on improvement - which reflects improvement prior to increasing Ozempic to 1 mg weekly. Patient is a great GLP-1RA candidate given ASCVD and BMI 31. Reemphasized strategies for improving adherence to metformin. 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  - Reviewed long term cardiovascular and renal outcomes of uncontrolled blood sugar - Reviewed goal A1c, goal fasting, and goal 2 hour post prandial glucose - Reviewed dietary modifications including: reducing sugary beverage intake (set a goal of eliminating Pepsi and replacing it with sugar-free Gatorade or flavored water), focusing on meals with more protein and vegetables than carbohydrates, and combing protein with carbs to spike blood glucose less.  - Recommend to continue metformin XR 1000 mg daily.  - Recommend to continue Ozempic 1 mg weekly.  - Recommend to check glucose in the morning while fasting and two-hours after a meal each day.  - Recheck A1C in June 2025 - if < 7.5% patient will be able to schedule foot surgery.   Hypertension: - Currently controlled based on most recent BP readings from office visits Diastolic slightly elevated > 80 mmHg. Will continue to [facility], as there is room to increase ACEi/hydrochlorothiazide combo tab.  - Reviewed long term cardiovascular and renal outcomes of uncontrolled blood pressure - Reviewed appropriate blood pressure monitoring technique and reviewed goal blood pressure. Recommended to check home blood pressure and heart rate periodically - Recommend to continue metoprolol succinate 25 mg daily and lisinopril/hydrochlorothiazide 10/12.5 mg daily  Hyperlipidemia/ASCVD Risk Reduction: - Currently uncontrolled based on most recent LDL of 146 in May 2024. LDL goal <55. Unchanged from ~1 year ago, likely due to nonadherence. Emphasized adherence today.  - Reviewed long term complications of uncontrolled cholesterol - Recommend to continue atorvastatin 80 mg daily with food  - Great candidate for Repatha, though did not discuss today due to time constraints.   Follow Up Plan: Pharmacist telephone 09/04/23, PCP 10/07/23  Aaron Kennedy, PharmD PGY1 Pharmacy Resident

## 2023-07-19 ENCOUNTER — Encounter: Payer: Self-pay | Admitting: Podiatry

## 2023-07-19 ENCOUNTER — Ambulatory Visit: Admitting: Podiatry

## 2023-07-19 DIAGNOSIS — M19071 Primary osteoarthritis, right ankle and foot: Secondary | ICD-10-CM

## 2023-07-20 ENCOUNTER — Encounter

## 2023-07-22 ENCOUNTER — Ambulatory Visit

## 2023-07-22 DIAGNOSIS — R0683 Snoring: Secondary | ICD-10-CM

## 2023-07-22 DIAGNOSIS — I6330 Cerebral infarction due to thrombosis of unspecified cerebral artery: Secondary | ICD-10-CM

## 2023-07-23 NOTE — Progress Notes (Signed)
  Subjective:  Patient ID: KRISTEN BUSHWAY, male    DOB: [birth year],  MRN: [id number]  Chief Complaint  Patient presents with   Follow-up    Patient is here for injection    53 y.o. male presents with the above complaint. History confirmed with patient.  He has been dealing with ankle pain for some time he is working on getting his blood sugar down for a planned surgery.  Injections have been helpful in reducing pain and improving function.  Objective:  Physical Exam: warm, good capillary refill, no trophic changes or ulcerative lesions, weakly palpable DP and PT pulses, and pain along the anterior ankle joint line and medial gutter  Assessment:   1. Arthritis of right ankle      Plan:  Patient was evaluated and treated and all questions answered.  Discussed treatment options he requested injection today so he can continue working and functioning they have decreased his pain and increase his ability to work and ambulate.  Last injection was in December.  Discussed the risks and the impact on blood sugar.  Following sterile prep with Betadine, 10 mg of Kenalog, 4 mg of dexamethasone and 0.5 cc of 0.5% Marcaine plain was injected directly into the ankle joint through a medial gutter approach.  He tolerated this well.  Bandage applied.  Follow-up as needed.  No follow-ups on file.

## 2023-07-25 ENCOUNTER — Other Ambulatory Visit: Payer: Self-pay

## 2023-08-02 DIAGNOSIS — G4733 Obstructive sleep apnea (adult) (pediatric): Secondary | ICD-10-CM | POA: Diagnosis not present

## 2023-08-03 NOTE — Telephone Encounter (Signed)
 Copied from CRM [id number]. Topic: Clinical - Lab/Test Results >> Aug 03, 2023  3:12 PM [name] wrote: Reason for CRM: please call patient back with sleep study results.  Routing to Dr Linder Revere to advise results.

## 2023-08-04 NOTE — Telephone Encounter (Signed)
 Mr Aaron Kennedy had very mild sleep apnea, averaging between 5 and 6 apneas per hour, but his blood oxygen level was pretty low. Given his history ogf stroke, I suggest we try CPAP. Recommend order- new DME, new CPAP auto 5-15, mask of choice, humidifier, supplies, AirView/ card  He will need a return appointment in 31-90 days per insurance rules.

## 2023-08-04 NOTE — Telephone Encounter (Signed)
 I called and spoke to pt. Pt did not like the sound of sing a CPAP and asked about Inspire. I informed pt that Belva Boyden if more of a last resort after failed therapy with CPAP, oral appliance, etc. I informed pt that I could have Dr Linder Revere advise to see if there is anything else, and pt stated if there wasn't anything else he could do, he would move along with CPAP. Routing to Dr Linder Revere.

## 2023-08-19 ENCOUNTER — Other Ambulatory Visit: Payer: Self-pay | Admitting: Nurse Practitioner

## 2023-08-19 ENCOUNTER — Other Ambulatory Visit: Payer: Self-pay

## 2023-08-19 DIAGNOSIS — G629 Polyneuropathy, unspecified: Secondary | ICD-10-CM

## 2023-08-19 MED ORDER — GABAPENTIN 600 MG PO TABS
600.0000 mg | ORAL_TABLET | Freq: Two times a day (BID) | ORAL | 2 refills | Status: DC
  Filled 2023-08-19 – 2023-10-04 (×3): qty 60, 30d supply, fill #0
  Filled 2023-10-31: qty 60, 30d supply, fill #1
  Filled 2023-12-06: qty 60, 30d supply, fill #2

## 2023-09-04 ENCOUNTER — Other Ambulatory Visit: Payer: Self-pay

## 2023-09-04 NOTE — Progress Notes (Signed)
 09/04/2023 Name: Aaron Kennedy MRN: [id number] DOB: February 07, 1971  Chief Complaint  Patient presents with   Diabetes   Hyperlipidemia    Aaron Kennedy is a 53 y.o. year old male who presented for a telephone visit.   They were referred to the pharmacist by their PCP for assistance in managing diabetes and hyperlipidemia. PMH includes T2DM, PAD, Stroke (2016), tobacco use, HFmrEF.  Subjective:  Patient reports doing well today. He still has not been able to schedule his foot surgery with A1C > 7.5%, but is happy with improvement in BG since starting Ozempic  1 mg weekly. He denies nausea, vomiting, abdominal pain. He endorses reduced appetite, but is still able to eat throughout the day. He reports that he overall feels better.   Care Team: Primary Care Provider: Jerrlyn Morel, NP ; Next Scheduled Visit: 10/07/23  Medication Access/Adherence  Current Pharmacy:  [facility] [address] [place] [address] Phone: [phone] Fax: [phone]   Patient reports barriers to adherence - he forgets to take PM medications (atorvastatin ), and struggles to take all his medications in the AM because he does not always eat breakfast. He is only taking clopidogrel  every other day due to bleeding/bruising. He has started bringing atorvastatin  with him during the day to take after he eats something during the day. Patient denies transportation barriers to taking his medications. Patient denies cost barriers to taking his medications  Diabetes:  Current medications: metformin  XR 500 mg PO BID, semaglutide  (Ozempic ) 1 mg subcutaneous weekly (Saturdays).   Current glucose readings: Checked the other day - random BG of 119 mg/dL Using blood glucose meter to test BG a couple times per week. Does not have glucometer to review values.   Patient denies hypoglycemic s/sx including dizziness, shakiness, sweating. Patient denies  hyperglycemic symptoms including polyuria, polydipsia, polyphagia, nocturia, neuropathy. He has stable blurry vision.  Current meal patterns (2 meals/day): trying to avoid fried foods - eating more broiled and grilled foods. - Breakfast: boiled eggs, sausage, egg, and biscuit - often picks something up from [employer]. - Supper: grilled/broiled shrimp or fish, home fries, broccoli, pork chops + green beans + red potatoes  - Snacks: cucumber with vinegar, watermelon  - Drinks: coffee with creamer, has tried to cut back on regular Pepsi - but still drinking some. Trying to replace at least 2 of his daily sodas with zero-sugar gatorade. He does not drink much water  throughout the day.   Hypertension:  Current medications: metoprolol  succinate 25 mg daily, lisinopril /hydrochlorothiazide  10/12.5 mg daily  Hyperlipidemia/ASCVD Risk Reduction  Current lipid lowering medications: atorvastatin  80 mg daily (still forgetting to take every day, but overall improved adherence) - Patient reported some GI upset with atorvastatin  at first but was advised by doctor to take with food, which helped.   Antiplatelet regimen: clopidogrel  75 mg daily - taking every other day due to "thin skin" and frequent bleeding/bruising with daily administration. Recommended to take daily.   ASCVD History: PAD, CVA  Family History: stroke (sister), heart attack (brother) Risk Factors: HLD, HTN, DM, premature ASCVD  The ASCVD Risk score (Arnett DK, et al., 2019) failed to calculate for the following reasons:   Risk score cannot be calculated because patient has a medical history suggesting prior/existing ASCVD    Objective:  BP Readings from Last 3 Encounters:  07/01/23 130/84  06/23/23 131/83  06/07/23 (!) 132/95    Lab Results  Component Value Date  HGBA1C 7.9 (A) 06/23/2023    Lab Results  Component Value Date   CREATININE 0.86 06/23/2023   BUN 8 06/23/2023   NA 136 06/23/2023   K 4.2 06/23/2023   CL 101  06/23/2023   CO2 20 06/23/2023    Lab Results  Component Value Date   CHOL 199 06/23/2023   HDL 29 (L) 06/23/2023   LDLCALC 146 (H) 06/23/2023   TRIG 132 06/23/2023   CHOLHDL 6.9 (H) 06/23/2023   BP Readings from Last 3 Encounters:  07/01/23 130/84  06/23/23 131/83  06/07/23 (!) 132/95    Medications Reviewed Today     Reviewed by Adra Alanis, RPH (Pharmacist) on 09/04/23 at 1238  Med List Status: <None>   Medication Order Taking? Sig Documenting Provider Last Dose Status Informant  Accu-Chek Softclix Lancets lancets [id number]  Check up to twice daily. Jerrlyn Morel, NP  Active Self  atorvastatin  (LIPITOR) 80 MG tablet [id number] Yes Take 1 tablet (80 mg total) by mouth daily. Jerrlyn Morel, NP Taking Active Self           Med Note Petrina Bras Jul 17, 2023  2:56 PM) Taking very intermittently  Blood Glucose Monitoring Suppl (BLOOD GLUCOSE MONITOR SYSTEM) w/Device KIT [id number]  Use as directed up to twice daily. Jerrlyn Morel, NP  Active Self  clopidogrel  (PLAVIX ) 75 MG tablet [id number] Yes Take 1 tablet (75 mg total) by mouth daily. Jerrlyn Morel, NP Taking Active Self           Med Note Petrina Bras Jul 17, 2023  2:57 PM) Taking every other day because of bothersome cuts/bruises  fluticasone  (FLONASE ) 50 MCG/ACT nasal spray [id number]  Place 2 sprays into both nostrils daily. Jerrlyn Morel, NP  Active Self           Med Note Mauri Sous May 02, 2023  2:13 PM)    gabapentin  (NEURONTIN ) 600 MG tablet [id number] Yes Take 1 tablet (600 mg total) by mouth 2 (two) times daily. Jerrlyn Morel, NP Taking Active   Glucose Blood (BLOOD GLUCOSE TEST STRIPS) STRP [id number]  Check up to twice daily. Jerrlyn Morel, NP  Active Self  Lancet Device MISC [id number]  Check up to twice daily. May substitute to any manufacturer covered by patient's insurance. Jerrlyn Morel, NP  Active Self  lisinopril -hydrochlorothiazide  (ZESTORETIC ) 10-12.5 MG tablet  [id number] Yes TAKE 1 TABLET BY MOUTH DAILY. Jerrlyn Morel, NP Taking Active Self  metFORMIN  (GLUCOPHAGE -XR) 500 MG 24 hr tablet [id number] Yes Take 2 tablets (1,000 mg total) by mouth daily with breakfast. Nichols, Tonya S, NP Taking Active   metoprolol  succinate (TOPROL -XL) 25 MG 24 hr tablet [id number] Yes Take 1 tablet (25 mg total) by mouth daily. Jerrlyn Morel, NP Taking Active Self  Semaglutide , 1 MG/DOSE, 4 MG/3ML SOPN [id number] Yes Inject 1 mg as directed once a week. Jerrlyn Morel, NP Taking Active   Vitamin D , Ergocalciferol , (DRISDOL ) 1.25 MG (50000 UNIT) CAPS capsule [id number] Yes Take 1 capsule (50,000 Units total) by mouth every 7 (seven) days. Jerrlyn Morel, NP Taking Active             Assessment/Plan:   Diabetes: - Currently uncontrolled based on last A1c 7.9%, but improved from 9.2%. Congratulated patient on improvement - which reflects improvement prior to increasing Ozempic  to 1 mg weekly. Expect A1C to be close to goal since titrating  Ozempic . Patient is a great GLP-1RA candidate given ASCVD and BMI 31. Reemphasized strategies for improving adherence to metformin  including taking 1000 mg once daily with food (instead of BID). 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  - Reviewed long term cardiovascular and renal outcomes of uncontrolled blood sugar - Reviewed goal A1c, goal fasting, and goal 2 hour post prandial glucose - Reviewed dietary modifications including: reducing sugary beverage intake (set a goal of eliminating Pepsi and replacing it with sugar-free Gatorade or flavored water ), focusing on meals with more protein and vegetables than carbohydrates, and combing protein with carbs to spike blood glucose less.  - Recommend to continue metformin  XR 1000 mg daily in the morning. - Recommend to continue Ozempic  1 mg weekly.  - Recommend to check glucose in the morning while fasting and  two-hours after a meal each day.  - Recheck A1C in June 2025 - if < 7.5% patient will be able to schedule foot surgery.  - Obtain UACR in June 2025 - if elevated, consider initiation of SGLT2i regardless of A1C.  Hypertension: - Currently controlled based on most recent BP readings from office visits. Diastolic slightly elevated > 80 mmHg. Will continue to monitor, as there is room to increase ACEi/hydrochlorothiazide  combo tab.  - Reviewed long term cardiovascular and renal outcomes of uncontrolled blood pressure - Reviewed appropriate blood pressure monitoring technique and reviewed goal blood pressure. Recommended to check home blood pressure and heart rate periodically - Recommend to continue metoprolol  succinate 25 mg daily and lisinopril /hydrochlorothiazide  10/12.5 mg daily  Hyperlipidemia/ASCVD Risk Reduction: - Currently uncontrolled based on most recent LDL of 145 mg/dL in March 2025, unchanged from last lipid panel in 2024. LDL goal <55. Emphasized adherence today. Will discuss initiation of Repatha at next visit.  - Reviewed long term complications of uncontrolled cholesterol - Recommend to continue atorvastatin  80 mg daily with food   Follow Up Plan: PCP 10/07/23, Pharmacist telephone 10/31/23 at 11AM  Arthea Larsson, PharmD PGY1 Pharmacy Resident

## 2023-09-05 ENCOUNTER — Other Ambulatory Visit: Payer: Self-pay

## 2023-09-08 ENCOUNTER — Other Ambulatory Visit: Payer: Self-pay

## 2023-09-14 NOTE — Progress Notes (Signed)
 These days have been open. There was not an 11 appt I scheduled at 1130.  Barnie Bora  [facility]  [facility], [facility] Guide  Direct Dial: [phone]  Fax [phone]

## 2023-09-15 ENCOUNTER — Other Ambulatory Visit: Payer: Self-pay

## 2023-10-04 ENCOUNTER — Other Ambulatory Visit: Payer: Self-pay

## 2023-10-05 ENCOUNTER — Other Ambulatory Visit: Payer: Self-pay

## 2023-10-05 NOTE — Progress Notes (Signed)
 HPI Aaron Kennedy, followed for minimal OSA, complicated by HTN, DM2, PAD, Hx CVA/ Seizure Disorder,  Tobacco Use HST 07/24/23- AHI 5.9/hr, desat to 84%, body weight 224 lbs PFT 10/06/23- WNL w/o resp to BD =================================================================================================================== 07/01/23- 52 yoM for sleep evaluation courtesy of [name], NP with concern of snoring Medical problem list includes HTN, DM2, PAD, Hx CVA Tobacco Use Epworth score-6 Body weight today-224 lbs Discussed the use of AI scribe software for clinical note transcription with the patient, who gave verbal consent to proceed.  History of Present Illness   He was referred for concern of witnessed snoring which raises question of sleep apnea in context of stroke history. His girlfriend isn't here today to elaborate. He admits dozing off at times, watching TV.  No ENT surgery other than repair of nasal laceration. Has family hx of OSA.   The patient, a long-term Kennedy, presents with respiratory issues, which he attributes to his smoking habit. He reports having phlegm, particularly at night and in the morning, which he manages by expectorating. He denies having severe chest colds or other respiratory illnesses, except for bronchitis when he was younger. He is concerned about his lung function and asked me to assess. He agrees to CXR and PFT.  The patient also has a history of stroke in 2016, which has affected his right side and balance, making it difficult for him to perform certain tasks such as putting on underwear. 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  The patient also reports taking Klonopin 2 mg, not prescribed to him, to help with sleep. He reports sleeping soundly through the night with this medication. He has a history of seizures, which occurred  before his stroke. He was previously on Xanax  and amitriptyline , but stopped due to side effects including memory issues and dental problems.  The patient also has a history of physical injuries, including a shattered foot from playing basketball and a cut on the nose from a childhood accident. He is due for foot surgery and has recently had hernia surgery. He also expresses concern about his dental health, which he believes has been affected by his previous use of amitriptyline .    Assessment and Plan:    Sleep Apnea Discussed potential sleep apnea and importance of diagnosis to prevent complications. He is aware of CPAP therapy and open to evaluation. - Order home sleep test to evaluate for sleep apnea. - Instruct him to call two weeks after the test for results.  Chronic Obstructive Pulmonary Disease (COPD) Discussed smoking's impact on lung health and need for lung function evaluation. - Order chest X-ray to assess lung condition. - Schedule pulmonary function test to evaluate lung function.  Ischemic Stroke Experienced ischemic stroke in 2016 with right-sided weakness and balance issues. Hypertension may have expedited clot passage.  Seizure Disorder Seizures in mid-30s, treated with Xanax  and amitriptyline , discontinued due to side effects.  Diabetes Management Concerned about A1c levels, started on Ozempic  with positive response. Making dietary changes to manage diabetes.  Dental Health Concerns Significant dental issues attributed to long-term amitriptyline  use he blames for taking the fluoride out of my mouth. Aware of potential health impacts.       10/06/23- 53 yoM Kennedy, followed for minimal OSA, complicated by HTN, DM2, PAD, Hx CVA/ Seizure Disorder,  Tobacco Use HST 07/24/23- AHI 5.9/hr, desat  to 84%, body weight 224 lbs CXR and PFT were advised last visit- not yet Body weight today-216 lbs Because of low sats on HST and hx CVA, I had suggested trial of CPAP- not  done. PFT 10/06/23- WNL w/o resp to BD Discussed the use of AI scribe software for clinical note transcription with the patient, who gave verbal consent to proceed.  History of Present Illness   Aaron Kennedy is a 53 year old male with a history of stroke who presents for evaluation of sleep apnea.He experiences minimal sleep apnea with episodes occurring five to six times per hour, which is considered borderline. There is a slight drop in oxygen levels during the night. His history of stroke has affected his balance, which may indirectly impact his sleep quality.We discussed his smoking again. Despite normal PFT, he should quit completely.We will order overnight oximetry to double check after sleep study.     Assessment and Plan:    Sleep apnea, mildMild sleep apnea with AHI 5-6/hr. Oxygen desaturation suggests cardiovascular stress. Hesitant about CPAP.- Repeat home sleep test with oxygen meter.- Advise side or stomach sleeping.Diabetes mellitus, type 2- managed elsewhereImproved glycemic control with blood glucose 122-128 mg/dL. Ozempic  aiding weight loss and appetite reduction.StrokeResidual balance issues and toe dragging post-stroke. Adjusted work activities.- Order chest X-ray to assess pulmonary health.     ROS-see HPI   + = positive Constitutional:    weight loss, night sweats, fevers, chills, fatigue, lassitude. HEENT:    headaches, difficulty swallowing, tooth/dental problems, sore throat,       sneezing, itching, ear ache, nasal congestion, post nasal drip, snoring CV:    chest pain, orthopnea, PND, swelling in lower extremities, anasarca,                                   dizziness, palpitations Resp:   shortness of breath with exertion or at rest.                +productive cough,   non-productive cough, coughing up of blood.              change in color of mucus.  wheezing.   Skin:    rash or lesions. GI:  No-   heartburn, indigestion, abdominal pain, nausea, vomiting, diarrhea,                  change in bowel habits, loss of appetite GU: dysuria, change in color of urine, no urgency or frequency.   flank pain. MS:   joint pain, stiffness, decreased range of motion, back pain. Neuro-     nothing unusual Psych:  change in mood or affect.  depression or anxiety.   memory loss.  OBJ- Physical Exam General- Alert, Oriented, Affect-appropriate, Distress- none acute, +muscular/ lean Skin- rash-none, lesions- none, excoriation- none, +tanned Lymphadenopathy- none Head- atraumatic            Eyes- Gross vision intact, PERRLA, conjunctivae and secretions clear            Ears- Hearing, canals-normal            Nose- Clear, no-Septal dev, mucus, polyps, erosion, perforation             Throat- Mallampati III , mucosa clear , drainage- none, tonsils- atrophic Neck- flexible , trachea midline, no stridor , thyroid  nl, carotid no bruit Chest - symmetrical excursion , unlabored  Heart/CV- RRR , no murmur , no gallop  , no rub, nl s1 s2                           - JVD- none , edema- none, stasis changes- none, varices- none           Lung- clear to P&A, wheeze- none, cough+light , dullness-none, rub- none           Chest wall-  Abd-  Br/ Gen/ Rectal- Not done, not indicated Extrem- cyanosis- none, clubbing, none, atrophy- none, strength- nl Neuro- grossly intact to observation

## 2023-10-06 ENCOUNTER — Encounter: Payer: Self-pay | Admitting: Internal Medicine

## 2023-10-06 ENCOUNTER — Ambulatory Visit (INDEPENDENT_AMBULATORY_CARE_PROVIDER_SITE_OTHER)

## 2023-10-06 ENCOUNTER — Ambulatory Visit: Admitting: Internal Medicine

## 2023-10-06 VITALS — BP 118/78 | HR 79 | Temp 97.7°F | Ht 71.0 in | Wt 216.0 lb

## 2023-10-06 DIAGNOSIS — J449 Chronic obstructive pulmonary disease, unspecified: Secondary | ICD-10-CM

## 2023-10-06 DIAGNOSIS — G4734 Idiopathic sleep related nonobstructive alveolar hypoventilation: Secondary | ICD-10-CM

## 2023-10-06 DIAGNOSIS — F17210 Nicotine dependence, cigarettes, uncomplicated: Secondary | ICD-10-CM | POA: Diagnosis not present

## 2023-10-06 DIAGNOSIS — M25561 Pain in right knee: Secondary | ICD-10-CM

## 2023-10-06 DIAGNOSIS — J42 Unspecified chronic bronchitis: Secondary | ICD-10-CM | POA: Diagnosis not present

## 2023-10-06 DIAGNOSIS — S8411XA Injury of peroneal nerve at lower leg level, right leg, initial encounter: Secondary | ICD-10-CM

## 2023-10-06 LAB — PULMONARY FUNCTION TEST
DL/VA % pred: 114 %
DL/VA: 4.99 ml/min/mmHg/L
DLCO cor % pred: 106 %
DLCO cor: 31.78 ml/min/mmHg
DLCO unc % pred: 106 %
DLCO unc: 31.78 ml/min/mmHg
FEF 25-75 Post: 3.95 L/s
FEF 25-75 Pre: 3.37 L/s
FEF2575-%Change-Post: 17 %
FEF2575-%Pred-Post: 115 %
FEF2575-%Pred-Pre: 98 %
FEV1-%Change-Post: 4 %
FEV1-%Pred-Post: 93 %
FEV1-%Pred-Pre: 89 %
FEV1-Post: 3.71 L
FEV1-Pre: 3.56 L
FEV1FVC-%Change-Post: 2 %
FEV1FVC-%Pred-Pre: 102 %
FEV6-%Change-Post: 1 %
FEV6-%Pred-Post: 91 %
FEV6-%Pred-Pre: 90 %
FEV6-Post: 4.55 L
FEV6-Pre: 4.5 L
FEV6FVC-%Change-Post: 0 %
FEV6FVC-%Pred-Post: 103 %
FEV6FVC-%Pred-Pre: 103 %
FVC-%Change-Post: 1 %
FVC-%Pred-Post: 88 %
FVC-%Pred-Pre: 87 %
FVC-Post: 4.57 L
FVC-Pre: 4.5 L
Post FEV1/FVC ratio: 81 %
Post FEV6/FVC ratio: 100 %
Pre FEV1/FVC ratio: 79 %
Pre FEV6/FVC Ratio: 100 %
RV % pred: 125 %
RV: 2.7 L
TLC % pred: 99 %
TLC: 7.15 L

## 2023-10-06 NOTE — Patient Instructions (Addendum)
 Order  CXR     dx COPD mixed type  Order- schedule overnight oximetry on room air    dx nocturnal hypoxemia  You have borderline minimal sleep apnea. We can wait on this, but I encourage you to try to sleep off the flat of your back.  Try to get off the cigarettes now- they are bad for your heart and lungs.

## 2023-10-06 NOTE — Progress Notes (Signed)
 Full PFT performed today.

## 2023-10-06 NOTE — Patient Instructions (Signed)
 Full PFT performed today.

## 2023-10-07 ENCOUNTER — Encounter: Payer: Self-pay | Admitting: Nurse Practitioner

## 2023-10-07 ENCOUNTER — Telehealth: Payer: Self-pay | Admitting: Podiatry

## 2023-10-07 ENCOUNTER — Ambulatory Visit (INDEPENDENT_AMBULATORY_CARE_PROVIDER_SITE_OTHER): Payer: Self-pay | Admitting: Nurse Practitioner

## 2023-10-07 ENCOUNTER — Ambulatory Visit: Payer: Self-pay | Admitting: Internal Medicine

## 2023-10-07 VITALS — BP 110/80 | HR 70 | Temp 97.6°F | Resp 16 | Ht 72.0 in | Wt 214.6 lb

## 2023-10-07 DIAGNOSIS — M25561 Pain in right knee: Secondary | ICD-10-CM

## 2023-10-07 DIAGNOSIS — Z13220 Encounter for screening for lipoid disorders: Secondary | ICD-10-CM | POA: Diagnosis not present

## 2023-10-07 DIAGNOSIS — E559 Vitamin D deficiency, unspecified: Secondary | ICD-10-CM

## 2023-10-07 DIAGNOSIS — G8929 Other chronic pain: Secondary | ICD-10-CM

## 2023-10-07 DIAGNOSIS — E118 Type 2 diabetes mellitus with unspecified complications: Secondary | ICD-10-CM | POA: Diagnosis not present

## 2023-10-07 LAB — POCT GLYCOSYLATED HEMOGLOBIN (HGB A1C): Hemoglobin A1C: 6.5 % — AB (ref 4.0–5.6)

## 2023-10-07 MED ORDER — KETOROLAC TROMETHAMINE 60 MG/2ML IM SOLN
60.0000 mg | Freq: Once | INTRAMUSCULAR | Status: AC
  Administered 2023-10-07: 60 mg via INTRAMUSCULAR

## 2023-10-07 NOTE — Patient Instructions (Signed)
 1. DM type 2, controlled, with complication (HCC) (Primary)  - HgB A1c - Vitamin D , 25-hydroxy - CBC - Comprehensive metabolic panel with GFR   2. Vitamin D  deficiency  - Vitamin D , 25-hydroxy   3. Lipid screening  - Lipid Panel

## 2023-10-07 NOTE — Progress Notes (Signed)
 Subjective   Patient ID: Aaron Kennedy, male    DOB: 1970/07/16, 53 y.o.   MRN: [id number]  Chief Complaint  Patient presents with   Medical Management of Chronic Issues    Referring provider: Jerrlyn Morel, NP  Aaron Kennedy is a 53 y.o. male with Past Medical History: No date: Anxiety No date: Chronic pain of right knee 08/26/2021: DM type 2, controlled, with complication (HCC) No date: Hypertension 01/2019: Insomnia No date: PAD (peripheral artery disease) (HCC) No date: Stroke [name])   HPI  Hypertension:   Patient also presents today for follow-up on hypertension. Patient states that he is compliant with Zestoretic . He states he has been doing well with no new issues or concerns.  Blood pressure was stable in office today.     Diabetes:   Patient is not checking home blood sugars.   Home blood sugar records: patient does not check sugars How often is blood sugars being checked: N/A Current symptoms/problems include none and have been stable. Daily foot checks: yes   Any foot concerns: none Last eye exam: N/A Exercise:  staying active every day  A1C in office today is 6.5   No Known Allergies  Immunization History  Administered Date(s) Administered   Influenza, Seasonal, Injecte, Preservative Fre 12/23/2022   Influenza,inj,Quad PF,6+ Mos 12/29/2015, 02/06/2019, 02/25/2021, 03/01/2022   Pneumococcal Polysaccharide-23 09/26/2015   Tdap 12/29/2015    Tobacco History: Social History   Tobacco Use  Smoking Status Every Day   Current packs/day: 0.50   Average packs/day: 0.5 packs/day for 20.0 years (10.0 ttl pk-yrs)   Types: Cigarettes  Smokeless Tobacco Never   Ready to quit: Not Answered Counseling given: Not Answered   Outpatient Encounter Medications as of 10/07/2023  Medication Sig   Accu-Chek Softclix Lancets lancets Check up to twice daily.   atorvastatin  (LIPITOR) 80 MG tablet Take 1 tablet (80 mg total) by mouth daily.   Blood  Glucose Monitoring Suppl (BLOOD GLUCOSE MONITOR SYSTEM) w/Device KIT Use as directed up to twice daily.   clopidogrel  (PLAVIX ) 75 MG tablet Take 1 tablet (75 mg total) by mouth daily.   fluticasone  (FLONASE ) 50 MCG/ACT nasal spray Place 2 sprays into both nostrils daily.   gabapentin  (NEURONTIN ) 600 MG tablet Take 1 tablet (600 mg total) by mouth 2 (two) times daily.   Glucose Blood (BLOOD GLUCOSE TEST STRIPS) STRP Check up to twice daily.   Lancet Device MISC Check up to twice daily. May substitute to any manufacturer covered by patient's insurance.   lisinopril -hydrochlorothiazide  (ZESTORETIC ) 10-12.5 MG tablet TAKE 1 TABLET BY MOUTH DAILY.   metFORMIN  (GLUCOPHAGE -XR) 500 MG 24 hr tablet Take 2 tablets (1,000 mg total) by mouth daily with breakfast.   metoprolol  succinate (TOPROL -XL) 25 MG 24 hr tablet Take 1 tablet (25 mg total) by mouth daily.   Semaglutide , 1 MG/DOSE, 4 MG/3ML SOPN Inject 1 mg as directed once a week.   Vitamin D , Ergocalciferol , (DRISDOL ) 1.25 MG (50000 UNIT) CAPS capsule Take 1 capsule (50,000 Units total) by mouth every 7 (seven) days.   Facility-Administered Encounter Medications as of 10/07/2023  Medication   ketorolac  (TORADOL ) injection 60 mg    Review of Systems  Review of Systems  Constitutional: Negative.   HENT: Negative.    Cardiovascular: Negative.   Gastrointestinal: Negative.   Allergic/Immunologic: Negative.   Neurological: Negative.   Psychiatric/Behavioral: Negative.       Objective:   BP 110/80   Pulse 70   Temp 97.6  F (36.4 C) (Temporal)   Resp 16   Ht 6' (1.829 m)   Wt 214 lb 9.6 oz (97.3 kg)   SpO2 94%   BMI 29.10 kg/m   Wt Readings from Last 5 Encounters:  10/07/23 214 lb 9.6 oz (97.3 kg)  10/06/23 216 lb (98 kg)  07/01/23 224 lb 3.2 oz (101.7 kg)  06/23/23 226 lb 9.6 oz (102.8 kg)  05/11/23 229 lb 1.6 oz (103.9 kg)     Physical Exam Vitals and nursing note reviewed.  Constitutional:      General: He is not in acute  distress.    Appearance: He is well-developed.   Cardiovascular:     Rate and Rhythm: Normal rate and regular rhythm.  Pulmonary:     Effort: Pulmonary effort is normal.     Breath sounds: Normal breath sounds.   Skin:    General: Skin is warm and dry.   Neurological:     Mental Status: He is alert and oriented to person, place, and time.       Assessment & Plan:   DM type 2, controlled, with complication (HCC) -     POCT glycosylated hemoglobin (Hb A1C) -     VITAMIN D  25 Hydroxy (Vit-D Deficiency, Fractures) -     CBC -     Comprehensive metabolic panel with GFR  Vitamin D  deficiency -     VITAMIN D  25 Hydroxy (Vit-D Deficiency, Fractures)  Lipid screening -     Lipid panel  Chronic pain of right knee -     Ambulatory referral to Orthopedic Surgery -     Ketorolac  Tromethamine     Return in about 6 months (around 04/07/2024).     Jerrlyn Morel, NP 10/07/2023

## 2023-10-07 NOTE — Telephone Encounter (Signed)
 Patient came into the office today to update you that his A1C has gone down to 6.2%. Patient has been having foot pain and is requesting to have his surgery that he was suppose to have in the beginning of the year now that his A1C has gone down the 7.5%.

## 2023-10-07 NOTE — Progress Notes (Signed)
 Spoke with pt I relayed cxr results and he understood. NFN

## 2023-10-08 LAB — COMPREHENSIVE METABOLIC PANEL WITH GFR
ALT: 20 IU/L (ref 0–44)
AST: 12 IU/L (ref 0–40)
Albumin: 4.2 g/dL (ref 3.8–4.9)
Alkaline Phosphatase: 120 IU/L (ref 44–121)
BUN/Creatinine Ratio: 13 (ref 9–20)
BUN: 12 mg/dL (ref 6–24)
Bilirubin Total: 0.5 mg/dL (ref 0.0–1.2)
CO2: 22 mmol/L (ref 20–29)
Calcium: 8.8 mg/dL (ref 8.7–10.2)
Chloride: 102 mmol/L (ref 96–106)
Creatinine, Ser: 0.89 mg/dL (ref 0.76–1.27)
Globulin, Total: 2.3 g/dL (ref 1.5–4.5)
Glucose: 92 mg/dL (ref 70–99)
Potassium: 4.1 mmol/L (ref 3.5–5.2)
Sodium: 138 mmol/L (ref 134–144)
Total Protein: 6.5 g/dL (ref 6.0–8.5)
eGFR: 102 mL/min/{1.73_m2} (ref >59)

## 2023-10-08 LAB — LIPID PANEL
Chol/HDL Ratio: 7.9 ratio — ABNORMAL HIGH (ref 0.0–5.0)
Cholesterol, Total: 213 mg/dL — ABNORMAL HIGH (ref 100–199)
HDL: 27 mg/dL — ABNORMAL LOW (ref >39)
LDL Chol Calc (NIH): 163 mg/dL — ABNORMAL HIGH (ref 0–99)
Triglycerides: 124 mg/dL (ref 0–149)
VLDL Cholesterol Cal: 23 mg/dL (ref 5–40)

## 2023-10-08 LAB — CBC
Hematocrit: 49.6 % (ref 37.5–51.0)
Hemoglobin: 16.2 g/dL (ref 13.0–17.7)
MCH: 33.5 pg — ABNORMAL HIGH (ref 26.6–33.0)
MCHC: 32.7 g/dL (ref 31.5–35.7)
MCV: 103 fL — ABNORMAL HIGH (ref 79–97)
Platelets: 203 10*3/uL (ref 150–450)
RBC: 4.84 x10E6/uL (ref 4.14–5.80)
RDW: 13.1 % (ref 11.6–15.4)
WBC: 10.7 10*3/uL (ref 3.4–10.8)

## 2023-10-08 LAB — VITAMIN D 25 HYDROXY (VIT D DEFICIENCY, FRACTURES): Vit D, 25-Hydroxy: 60.8 ng/mL (ref 30.0–100.0)

## 2023-10-09 ENCOUNTER — Ambulatory Visit: Payer: Self-pay | Admitting: Nurse Practitioner

## 2023-10-10 NOTE — Telephone Encounter (Signed)
 Pt left message on 10/07/23 at 1056am stating he wanted to get surgery scheduled.  Upon checking I do not have a consent on file.  I called pt and scheduled him to see Dr Malvin for a surgical consult.

## 2023-10-12 ENCOUNTER — Other Ambulatory Visit: Payer: Self-pay

## 2023-10-12 MED ORDER — CLINDAMYCIN HCL 150 MG PO CAPS
150.0000 mg | ORAL_CAPSULE | Freq: Four times a day (QID) | ORAL | 0 refills | Status: AC
  Filled 2023-10-12: qty 28, 7d supply, fill #0

## 2023-10-12 MED ORDER — PENICILLIN V POTASSIUM 500 MG PO TABS
500.0000 mg | ORAL_TABLET | Freq: Three times a day (TID) | ORAL | 0 refills | Status: DC
  Filled 2023-10-12: qty 28, 10d supply, fill #0

## 2023-10-13 ENCOUNTER — Ambulatory Visit: Admitting: Podiatry

## 2023-10-18 ENCOUNTER — Other Ambulatory Visit: Payer: Self-pay

## 2023-10-18 DIAGNOSIS — K090 Developmental odontogenic cysts: Secondary | ICD-10-CM | POA: Diagnosis not present

## 2023-10-18 MED ORDER — HYDROCODONE-ACETAMINOPHEN 5-325 MG PO TABS
1.0000 | ORAL_TABLET | ORAL | 0 refills | Status: DC
  Filled 2023-10-18: qty 10, 3d supply, fill #0

## 2023-10-19 ENCOUNTER — Telehealth: Payer: Self-pay | Admitting: Nurse Practitioner

## 2023-10-19 NOTE — Telephone Encounter (Signed)
 Please advise [facility]

## 2023-10-19 NOTE — Telephone Encounter (Unsigned)
 Copied from CRM [id number]. Topic: Clinical - Medical Advice >> Oct 19, 2023  1:06 PM Tiffini S wrote: Reason for CRM: Patient had a tooth pulled yesterday- he has taking all of his medications and is asking if he should take the blood thinner medication as well. Please call the patient back at  [phone].

## 2023-10-19 NOTE — Telephone Encounter (Signed)
 Pt was advised [facility]

## 2023-10-27 ENCOUNTER — Ambulatory Visit: Admitting: Podiatry

## 2023-10-27 ENCOUNTER — Encounter: Payer: Self-pay | Admitting: Podiatry

## 2023-10-27 DIAGNOSIS — M7671 Peroneal tendinitis, right leg: Secondary | ICD-10-CM

## 2023-10-27 DIAGNOSIS — M722 Plantar fascial fibromatosis: Secondary | ICD-10-CM

## 2023-10-27 DIAGNOSIS — M216X1 Other acquired deformities of right foot: Secondary | ICD-10-CM

## 2023-10-27 DIAGNOSIS — M19071 Primary osteoarthritis, right ankle and foot: Secondary | ICD-10-CM

## 2023-10-27 NOTE — Progress Notes (Signed)
 Subjective:  Patient ID: Aaron Kennedy, male    DOB: [birth year],  MRN: [id number]  Chief Complaint  Patient presents with   Arthritis    surgical consult.Arthritis of right ankle. A1C 6.5. 5 pain.    53 y.o. male presents for follow-up of chronic right ankle pain.  Patient has lowered his A1c in anticipation of possible surgical intervention for his right ankle.  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  He does still have pain both in the heel of his foot as well as the outside of his ankle behind the lateral malleolus.  He says he feels like his ankle gives out sometimes.  Past Medical History:  Diagnosis Date   Anxiety    Chronic pain of right knee    DM type 2, controlled, with complication (HCC) 08/26/2021   Hypertension    Insomnia 01/2019   PAD (peripheral artery disease) (HCC)    Stroke (HCC)     No Known Allergies  ROS: Negative except as per HPI above  Objective:  General: AAO x3, NAD  Dermatological: Surgical scar present right midfoot dorsally.  Vascular:  Dorsalis Pedis artery and Posterior Tibial artery pedal pulses are 2/4 bilateral.  Capillary fill time < 3 sec to all digits.   Neruologic: Grossly intact via light touch bilateral. Protective threshold intact to all sites bilateral.   Musculoskeletal: Decreased pain with palpation about the anterior aspect of the tibiotalar joint on the right lower extremity.  Pain is increased with ankle dorsiflexion plantarflexion inversion eversion range of motion.   Pain with palpation of the plantar medial aspect of the right heel at the medial tubercle consistent with plantar fasciitis.  Pain with palpation of the peroneal tendons just posterior to the lateral malleolus  Gait: Unassisted, Nonantalgic.   No images are attached to the encounter.  Radiographs:  Deferred new radiographs today  MRI right ankle  02/26/2023 1. Tibialis posterior tenosynovitis and moderate distal tendinopathy. 2. Mild flexor digitorum longus and flexor hallucis longus tenosynovitis. 3. 3 by 15 mm osteochondral lesion of the lateral talar dome manifesting primarily as subcortical marrow edema. 4. Irregular deep tibiotalar portion of the deltoid ligament, possibly previously torn. There is arthropathy between the medial malleolus and the adjacent attachment site of the tibiotalar portion of the deltoid ligament in the talus. Small ossicles noted in this vicinity below the medial malleolus and may have some mild internal edema signal. 5. Thickened slightly indistinct anterior inferior tibiofibular ligament possibly from an old injury. Attenuated and poorly seen anterior talofibular ligament, query remote tear. 6. Long tubular ganglion cyst involves the sinus tarsi and extends laterally and posteriorly to fork around the peroneus tendons. Assessment:   1. Arthritis of right ankle   2. Plantar fasciitis of right foot   3. Other acquired deformities of right foot   4. Peroneal tendinitis of lower leg, right        Plan:  Patient was evaluated and treated and all questions answered.  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  I again discussed the possibility of an ankle scope procedure with deltoid ligament repair.  He wishes to proceed.  I have discussed the case with Dr. Medicine and he will see the patient in consultation for possible surgical intervention.  Patient was in agreement with this plan will be scheduled with Dr. Silva for 1 to 2 weeks for further surgical planning/consultation.  Plantar Fasciitis, right - Educated on icing and stretching. Instructions given.  - Injection delivered to the plantar fascia as below. - DME: Power step orthotics dispensed - Pharmacologic  management: Says as needed  Procedure: Injection Tendon/Ligament Location: Right plantar fascia at the glabrous junction; medial approach. Skin Prep: alcohol Injectate: 1 cc 0.5% marcaine  plain, 1 cc kenalog 10. Disposition: Patient tolerated procedure well. Injection site dressed with a band-aid.  # Peroneal tendinitis at the posterior aspect of the lateral malleolus - Discussed patient does have evidence of a tendinitis of the peroneal tendons just posterior to the lateral mal - Recommend steroid injection he was agreeable - After sterile operative prep injected  1 cc percent Marcaine  plain with 1 cc Kenalog 10.  Patient tolerated well injection site dressed with a Band-Aid          Aaron Kennedy, DPM [facility] / [facility]

## 2023-10-31 ENCOUNTER — Telehealth: Payer: Self-pay | Admitting: Nurse Practitioner

## 2023-10-31 ENCOUNTER — Telehealth: Payer: Self-pay

## 2023-10-31 ENCOUNTER — Other Ambulatory Visit: Payer: Self-pay

## 2023-10-31 DIAGNOSIS — I6302 Cerebral infarction due to thrombosis of basilar artery: Secondary | ICD-10-CM

## 2023-10-31 DIAGNOSIS — E782 Mixed hyperlipidemia: Secondary | ICD-10-CM

## 2023-10-31 DIAGNOSIS — I739 Peripheral vascular disease, unspecified: Secondary | ICD-10-CM

## 2023-10-31 MED ORDER — REPATHA SURECLICK 140 MG/ML ~~LOC~~ SOAJ
140.0000 mg | SUBCUTANEOUS | 3 refills | Status: AC
  Filled 2023-10-31: qty 6, 84d supply, fill #0
  Filled 2024-02-06: qty 6, 84d supply, fill #1

## 2023-10-31 NOTE — Telephone Encounter (Addendum)
 Patient calling to confirm if he is to take Repatha  AND Atorvastatin .   Repatha  has been approved by insurance.   Patient states per DSS he needs some sort of letter to state he cannot work due to disability/prior stroke. He is afraid they will cancel his benefits without something like this.

## 2023-10-31 NOTE — Telephone Encounter (Signed)
 Copied from CRM [id number]. Topic: Clinical - Medication Question >> Oct 31, 2023 12:56 PM Jasmin G wrote: Reason for CRM: Last time pt. Came in his cholesterol levels were high and PCP recommended a medication, he needs PCP opinion on that medication. Patient also needs a note of him for disability from PCP. Please call him back ASAP.

## 2023-10-31 NOTE — Telephone Encounter (Signed)
 Pharmacy Patient Advocate Encounter  Received notification from [facility] that Prior Authorization for REPATHA  has been APPROVED from 10/31/2023 to 10/30/2024   PA #/Case ID/Reference #: [id number]

## 2023-10-31 NOTE — Progress Notes (Signed)
 10/31/2023 Name: Aaron Kennedy MRN: [id number] DOB: [birth year]  Chief Complaint  Patient presents with   Diabetes   Hypertension   Hyperlipidemia    Aaron Kennedy is a 53 y.o. year old male who presented for a telephone visit.   They were referred to the pharmacist by their PCP for assistance in managing diabetes and hyperlipidemia. PMH includes T2DM, PAD, Stroke (2016), tobacco use, HFmrEF.  Subjective: Patient was last engaged by pharmacy vis telephone on 09/04/23. He was happy with the improvement in his BG since increasing Ozempic  to 1 mg weekly. No medication changes were made. He saw his PCP, Aaron Borer, NP on 10/07/23. His A1C had improved from 7.9% to 6.5%. His LDL-C increased from 146 mg/dL to 836 mg/dL. He has an upcoming appt with [facility] for surgical planning.   Today, patient reports doing well. He continues to take atorvastatin  intermittently, but is adherence to all his other medications. He would be willing to take an injectable medicine for cholesterol.  Care Team: Primary Care Provider: Borer Aaron RAMAN, NP ; Next Scheduled Visit: 10/07/23  Medication Access/Adherence  Current Pharmacy:  [facility] - [facility] [address], [address] [place] [address] Phone: [phone] Fax: [phone]   Patient reports barriers to adherence - he forgets to take PM medications (atorvastatin ), and struggles to take all his medications in the AM because he does not always eat breakfast. He is only taking clopidogrel  every other day due to bleeding/bruising. He has started bringing atorvastatin  with him during the day to take after he eats something during the day. Patient denies transportation barriers to taking his medications. Patient denies cost barriers to taking his medications  Diabetes:  Current medications: metformin  XR 500 mg PO BID, semaglutide  (Ozempic ) 1 mg subcutaneous weekly (Thursdays)   Current  glucose readings: Checked the other day - random BG of 144 mg/dL Using blood glucose meter to test BG a couple times per week. Does not have glucometer to review values.   Patient denies hypoglycemic s/sx including dizziness, shakiness, sweating. Patient denies hyperglycemic symptoms including polyuria, polydipsia, polyphagia, nocturia, neuropathy. He has stable blurry vision.  Current meal patterns (2 meals/day): trying to avoid fried foods - eating more broiled and grilled foods. - Breakfast: boiled eggs, sausage, egg, and biscuit - often picks something up from [employer]. - Supper: grilled/broiled shrimp or fish, home fries, broccoli, pork chops + green beans + red potatoes  - Snacks: cucumber with vinegar, watermelon  - Drinks: coffee with creamer, has tried to cut back on regular Pepsi - but still drinking some. Trying to replace at least 2 of his daily sodas with zero-sugar gatorade. He does not drink much water  throughout the day.   Hypertension:  Current medications: metoprolol  succinate 25 mg daily, lisinopril /hydrochlorothiazide  10/12.5 mg daily  Hyperlipidemia/ASCVD Risk Reduction  Current lipid lowering medications: atorvastatin  80 mg daily (still taking very intermittently) - Patient reported some GI upset with atorvastatin  at first but was advised by doctor to take with food, which helped.   Antiplatelet regimen: clopidogrel  75 mg daily (has previously reported taking every other day due to thin skin, bleeding, bruising)  ASCVD History: PAD, CVA  Family History: stroke (sister), heart attack (brother) Risk Factors: HLD, HTN, DM, premature ASCVD  The ASCVD Risk score (Arnett DK, et al., 2019) failed to calculate for the following reasons:   Risk score cannot be calculated because patient has a medical history suggesting prior/existing ASCVD  Objective:  BP Readings from Last 3 Encounters:  10/07/23 110/80  10/06/23 118/78  07/01/23 130/84    Lab Results   Component Value Date   HGBA1C 6.5 (A) 10/07/2023   HGBA1C 7.9 (A) 06/23/2023   HGBA1C 9.2 (A) 04/22/2023    Lab Results  Component Value Date   CREATININE 0.89 10/07/2023   BUN 12 10/07/2023   NA 138 10/07/2023   K 4.1 10/07/2023   CL 102 10/07/2023   CO2 22 10/07/2023    Lab Results  Component Value Date   CHOL 213 (H) 10/07/2023   HDL 27 (L) 10/07/2023   LDLCALC 163 (H) 10/07/2023   TRIG 124 10/07/2023   CHOLHDL 7.9 (H) 10/07/2023    BP Readings from Last 3 Encounters:  10/07/23 110/80  10/06/23 118/78  07/01/23 130/84    Medications Reviewed Today     Reviewed by Brinda Lorain SQUIBB, RPH (Pharmacist) on 10/31/23 at 1155  Med List Status: <None>   Medication Order Taking? Sig Documenting Provider Last Dose Status Informant  Accu-Chek Softclix Lancets lancets [id number]  Check up to twice daily. Oley Aaron RAMAN, NP  Active Self  atorvastatin  (LIPITOR) 80 MG tablet [id number]  Take 1 tablet (80 mg total) by mouth daily. Oley Aaron RAMAN, NP  Active Self           Med Note DELSA LORAIN SQUIBB [place] Jul 17, 2023  2:56 PM) Taking very intermittently  Blood Glucose Monitoring Suppl (BLOOD GLUCOSE MONITOR SYSTEM) w/Device KIT [id number]  Use as directed up to twice daily. Oley Aaron RAMAN, NP  Active Self  clopidogrel  (PLAVIX ) 75 MG tablet [id number] Yes Take 1 tablet (75 mg total) by mouth daily. Oley Aaron RAMAN, NP  Active Self           Med Note DELSA LORAIN SQUIBB [place] Jul 17, 2023  2:57 PM) Taking every other day because of bothersome cuts/bruises  fluticasone  (FLONASE ) 50 MCG/ACT nasal spray [id number]  Place 2 sprays into both nostrils daily. Oley Aaron RAMAN, NP  Active Self           Med Note SOILA LYLE JAYSON Pablo May 02, 2023  2:13 PM)    gabapentin  (NEURONTIN ) 600 MG tablet [id number] Yes Take 1 tablet (600 mg total) by mouth 2 (two) times daily. Oley Aaron RAMAN, NP  Active   Glucose Blood (BLOOD GLUCOSE TEST STRIPS) STRP [id number]  Check up to twice daily. Oley Aaron RAMAN, NP   Active Self  HYDROcodone -acetaminophen  (NORCO/VICODIN) 5-325 MG tablet [id number]  Take 1 tablet by mouth every 4 (four) to 6 (six) hours as needed for pain [place] LELON Mickey., DDS  Active   Lancet Device MISC [id number]  Check up to twice daily. May substitute to any manufacturer covered by patient's insurance. Oley Aaron RAMAN, NP  Active Self  lisinopril -hydrochlorothiazide  (ZESTORETIC ) 10-12.5 MG tablet [id number] Yes TAKE 1 TABLET BY MOUTH DAILY. Oley Aaron RAMAN, NP  Active Self  metFORMIN  (GLUCOPHAGE -XR) 500 MG 24 hr tablet [id number] Yes Take 2 tablets (1,000 mg total) by mouth daily with breakfast. Nichols, Tonya S, NP  Active   metoprolol  succinate (TOPROL -XL) 25 MG 24 hr tablet [id number] Yes Take 1 tablet (25 mg total) by mouth daily. Oley Aaron RAMAN, NP  Active Self  penicillin  v potassium (VEETID) 500 MG tablet [id number]  Take 1 tablet (500 mg total) by mouth every 8 (eight) hours until taken   Active   Semaglutide , 1 MG/DOSE, 4 MG/3ML SOPN  [id number] Yes Inject 1 mg as directed once a week. Oley Aaron RAMAN, NP  Active   Vitamin D , Ergocalciferol , (DRISDOL ) 1.25 MG (50000 UNIT) CAPS capsule [id number] Yes Take 1 capsule (50,000 Units total) by mouth every 7 (seven) days. Oley Aaron RAMAN, NP  Active             Assessment/Plan:   Diabetes: - Currently controlled based on last A1c 6.5%, below goal < 7%, and improved from 7.9%. Congratulated patient on improvement! Patient is a great GLP-1RA candidate given ASCVD and BMI 31. 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  - Reviewed long term cardiovascular and renal outcomes of uncontrolled blood sugar - Reviewed goal A1c, goal fasting, and goal 2 hour post prandial glucose - Reviewed dietary modifications including: reducing sugary beverage intake (congratulated on cutting back on Pepsi), focusing on meals with more  protein and vegetables than carbohydrates, and combing protein with carbs to spike blood glucose less.  - Recommend to continue metformin  XR 1000 mg daily in the morning. - Recommend to continue Ozempic  1 mg weekly.  - Recommend to check glucose in the morning while fasting and two-hours after a meal a couple times per week - Next A1C due Sept 2025 - Obtain UACR in at next PCP appointment - if elevated, consider initiation of SGLT2i regardless of A1C.  Hypertension: - Currently controlled based on most recent BP readings from office visits. Appropriate to continue current regimen - Reviewed long term cardiovascular and renal outcomes of uncontrolled blood pressure - Reviewed appropriate blood pressure monitoring technique and reviewed goal blood pressure. Recommended to check home blood pressure and heart rate periodically - Recommend to continue metoprolol  succinate 25 mg daily and lisinopril /hydrochlorothiazide  10/12.5 mg daily  Hyperlipidemia/ASCVD Risk Reduction: - Currently uncontrolled based on most recent LDL of 163 mg/dL in March 2025, increased from LDL-C of 143 mg/dL on last lipid panel. LDL goal <55.Patient is amenable to starting PCSK9i, Repatha , in addition to high intensity statin to reduce risk of recurrent ASCVD event. - Reviewed long term complications of uncontrolled cholesterol - Recommend to continue atorvastatin  80 mg daily with food  - Recommend to start Repatha  140 mg injected into the skin once every 14 days. Will collaborate with CPhT to initiate PA. Repeat lipid panel 6-12 weeks after starting therapy.  - Educated patient on Repatha  administration and possible AE. Will request for additional counseling at first dispense at [facility].   Follow Up Plan: Pharmacist telephone 12/01/23, PCP 04/09/24  Lorain Baseman, PharmD [facility] [phone]

## 2023-11-02 ENCOUNTER — Other Ambulatory Visit: Payer: Self-pay

## 2023-11-02 ENCOUNTER — Ambulatory Visit: Admitting: Orthopaedic Surgery

## 2023-11-02 ENCOUNTER — Encounter: Payer: Self-pay | Admitting: Orthopaedic Surgery

## 2023-11-02 ENCOUNTER — Other Ambulatory Visit (INDEPENDENT_AMBULATORY_CARE_PROVIDER_SITE_OTHER): Payer: Self-pay

## 2023-11-02 ENCOUNTER — Encounter: Payer: Self-pay | Admitting: Internal Medicine

## 2023-11-02 ENCOUNTER — Other Ambulatory Visit (HOSPITAL_COMMUNITY): Payer: Self-pay

## 2023-11-02 VITALS — BP 124/84 | HR 84 | Ht 71.0 in | Wt 212.0 lb

## 2023-11-02 DIAGNOSIS — G5731 Lesion of lateral popliteal nerve, right lower limb: Secondary | ICD-10-CM

## 2023-11-02 DIAGNOSIS — G8929 Other chronic pain: Secondary | ICD-10-CM | POA: Diagnosis not present

## 2023-11-02 DIAGNOSIS — M25561 Pain in right knee: Secondary | ICD-10-CM | POA: Diagnosis not present

## 2023-11-02 DIAGNOSIS — F17210 Nicotine dependence, cigarettes, uncomplicated: Secondary | ICD-10-CM | POA: Diagnosis not present

## 2023-11-02 NOTE — Progress Notes (Signed)
 Subjective:    Patient ID: Aaron Kennedy, male    DOB: 27-Feb-1971, 53 y.o.   MRN: [id number]  HPI He has a knot on the medial side of the right knee that has been present there for years.  He has a tendon snapping over the knot and it is becoming very bothersome for him.  He has developed some pain.  He would like something done for it now.  He just recently got insurance and wants it treated now.  He had a stroke in 2016 affecting his right side. He has recovered greatly but sill has weakness on the right.  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  He has a plastic AFO but it is not working well for him.  He is a candidate for the newer type of carbon filament brace.   Review of Systems  Constitutional:  Positive for activity change.  Musculoskeletal:  Positive for arthralgias, gait problem and myalgias.  Neurological:        Post CVA with right sided residual including peroneal weakness right.  All other systems reviewed and are negative. For Review of Systems, all other systems reviewed and are negative.  The following is a summary of the past history medically, past history surgically, known current medicines, social history and family history.  This information is gathered electronically by the computer from prior information and documentation.  I review this each visit and have found including this information at this point in the chart is beneficial and informative.   Past Medical History:  Diagnosis Date   Anxiety    Chronic pain of right knee    DM type 2, controlled, with complication (HCC) 08/26/2021   Hypertension    Insomnia 01/2019   PAD (peripheral artery disease) (HCC)    Stroke [facility])     Past Surgical History:  Procedure Laterality Date   HERNIA REPAIR     INCISION AND DRAINAGE ABSCESS Left 07/21/2017   Procedure: INCISION AND DRAINAGE INGUINAL HERNIA ABSCESS;  Surgeon: Ebbie Cough, MD;  Location: [facility] OR;  Service: General;  Laterality: Left;   INGUINAL  HERNIA REPAIR Bilateral 05/07/2017   Procedure: LAPAROSCOPIC RIGHT  INGUINAL HERNIA REPAIR AND  OPEN LEFT   INGUINAL EXPLORATION;  Surgeon: Rubin Calamity, MD;  Location: [facility] OR;  Service: General;  Laterality: Bilateral;   INSERTION OF MESH Bilateral 05/07/2017   Procedure: INSERTION OF MESH;  Surgeon: Rubin Calamity, MD;  Location: [facility] OR;  Service: General;  Laterality: Bilateral;   LEG SURGERY     ORCHIECTOMY Left 05/11/2023   Procedure: LEFT REMOVAL OF INGUINAL MESH;  Surgeon: Rubin Calamity, MD;  Location: [facility] ORS;  Service: General;  Laterality: Left;    Current Outpatient Medications on File Prior to Visit  Medication Sig Dispense Refill   Semaglutide  (OZEMPIC , 1 MG/DOSE, Clifford) Inject into the skin.     Accu-Chek Softclix Lancets lancets Check up to twice daily. 100 each 3   atorvastatin  (LIPITOR) 80 MG tablet Take 1 tablet (80 mg total) by mouth daily. 90 tablet 3   Blood Glucose Monitoring Suppl (BLOOD GLUCOSE MONITOR SYSTEM) w/Device KIT Use as directed up to twice daily. 1 kit 0   clopidogrel  (PLAVIX ) 75 MG tablet Take 1 tablet (75 mg total) by mouth daily. 90 tablet 3   Evolocumab  (REPATHA  SURECLICK) 140 MG/ML SOAJ Inject 140 mg into the skin every 14 (fourteen) days. 6 mL 3   fluticasone  (FLONASE ) 50 MCG/ACT nasal spray Place 2 sprays into both nostrils  daily. 16 g 6   gabapentin  (NEURONTIN ) 600 MG tablet Take 1 tablet (600 mg total) by mouth 2 (two) times daily. 60 tablet 2   Glucose Blood (BLOOD GLUCOSE TEST STRIPS) STRP Check up to twice daily. 100 strip 3   HYDROcodone -acetaminophen  (NORCO/VICODIN) 5-325 MG tablet Take 1 tablet by mouth every 4 (four) to 6 (six) hours as needed for pain 10 tablet 0   Lancet Device MISC Check up to twice daily. May substitute to any manufacturer covered by patient's insurance. 1 each 0   lisinopril -hydrochlorothiazide  (ZESTORETIC ) 10-12.5 MG tablet TAKE 1 TABLET BY MOUTH DAILY. 90 tablet 3   metFORMIN  (GLUCOPHAGE -XR) 500 MG 24 hr tablet Take 2  tablets (1,000 mg total) by mouth daily with breakfast. 180 tablet 1   metoprolol  succinate (TOPROL -XL) 25 MG 24 hr tablet Take 1 tablet (25 mg total) by mouth daily. 90 tablet 3   penicillin  v potassium (VEETID) 500 MG tablet Take 1 tablet (500 mg total) by mouth every 8 (eight) hours until taken 28 tablet 0   Vitamin D , Ergocalciferol , (DRISDOL ) 1.25 MG (50000 UNIT) CAPS capsule Take 1 capsule (50,000 Units total) by mouth every 7 (seven) days. 5 capsule 2   No current facility-administered medications on file prior to visit.    Social History   Socioeconomic History   Marital status: Single    Spouse name: Not on file   Number of children: Not on file   Years of education: Not on file   Highest education level: 10th grade  Occupational History   Not on file  Tobacco Use   Smoking status: Every Day    Current packs/day: 0.50    Average packs/day: 0.5 packs/day for 20.0 years (10.0 ttl pk-yrs)    Types: Cigarettes   Smokeless tobacco: Never  Vaping Use   Vaping status: Never Used  Substance and Sexual Activity   Alcohol use: Yes   Drug use: Yes    Types: Marijuana   Sexual activity: Not Currently  Other Topics Concern   Not on file  Social History Narrative   Not on file   Social Drivers of Health   Financial Resource Strain: High Risk (10/04/2023)   Overall Financial Resource Strain (CARDIA)    Difficulty of Paying Living Expenses: Hard  Food Insecurity: Food Insecurity Present (10/04/2023)   Hunger Vital Sign    Worried About Running Out of Food in the Last Year: Sometimes true    Ran Out of Food in the Last Year: Sometimes true  Transportation Needs: No Transportation Needs (10/04/2023)   PRAPARE - Administrator, Civil Service (Medical): No    Lack of Transportation (Non-Medical): No  Physical Activity: Sufficiently Active (10/04/2023)   Exercise Vital Sign    Days of Exercise per Week: 3 days    Minutes of Exercise per Session: 60 min  Stress: Stress  Concern Present (10/04/2023)   [employer] of Occupational Health - Occupational Stress Questionnaire    Feeling of Stress: To some extent  Social Connections: Moderately Isolated (10/04/2023)   Social Connection and Isolation Panel    Frequency of Communication with Friends and Family: More than three times a week    Frequency of Social Gatherings with Friends and Family: Once a week    Attends Religious Services: 1 to 4 times per year    Active Member of [employer] or Organizations: No    Attends Banker Meetings: Not on file    Marital Status: Never married  Intimate Partner Violence: Not At Risk (10/07/2023)   Humiliation, Afraid, Rape, and Kick questionnaire    Fear of Current or Ex-Partner: No    Emotionally Abused: No    Physically Abused: No    Sexually Abused: No    Family History  Problem Relation Age of Onset   Stroke Brother        81s   Heart attack Sister        40s    BP 124/84   Pulse 84   Ht 5' 11 (1.803 m)   Wt 212 lb (96.2 kg)   BMI 29.57 kg/m   Body mass index is 29.57 kg/m.      Objective:   Physical Exam Vitals and nursing note reviewed. Exam conducted with a chaperone present.  Constitutional:      Appearance: He is well-developed.  HENT:     Head: Normocephalic and atraumatic.  Eyes:     Conjunctiva/sclera: Conjunctivae normal.     Pupils: Pupils are equal, round, and reactive to light.  Cardiovascular:     Rate and Rhythm: Normal rate and regular rhythm.  Pulmonary:     Effort: Pulmonary effort is normal.  Abdominal:     Palpations: Abdomen is soft.  Musculoskeletal:     Cervical back: Normal range of motion and neck supple.       Legs:  Skin:    General: Skin is warm and dry.  Neurological:     Mental Status: He is alert and oriented to person, place, and time.     Cranial Nerves: No cranial nerve deficit.     Motor: No abnormal muscle tone.     Coordination: Coordination normal.     Deep Tendon Reflexes: Reflexes  are normal and symmetric. Reflexes normal.  Psychiatric:        Behavior: Behavior normal.        Thought Content: Thought content normal.        Judgment: Judgment normal.   X-rays were done of the right knee, reported separately.        Assessment & Plan:   Encounter Diagnoses  Name Primary?   Chronic pain of right knee Yes   Peroneal palsy, right    Nicotine  dependence, cigarettes, uncomplicated    I will have him see Dr. Margrette for the defect prominence of the medial right knee and possible surgery.  Also, consider carbon filament brace right for peroneal nerve palsy.  Call if any problem.  Precautions discussed.  Electronically Signed Lemond Stable, MD 7/16/202511:16 AM

## 2023-11-02 NOTE — Patient Instructions (Addendum)
Smoking Tobacco Information, Adult Smoking tobacco can be harmful to your health. Tobacco contains a toxic colorless chemical called nicotine. Nicotine causes changes in your brain that make you want more and more. This is called addiction. This can make it hard to stop smoking once you start. Tobacco also has other toxic chemicals that can hurt your body and raise your risk of many cancers. Menthol or "lite" tobacco or cigarette brands are not safer than regular brands. How can smoking tobacco affect me? Smoking tobacco puts you at risk for: Cancer. Smoking is most commonly associated with lung cancer, but can also lead to cancer in other parts of the body. Chronic obstructive pulmonary disease (COPD). This is a long-term lung condition that makes it hard to breathe. It also gets worse over time. High blood pressure (hypertension), heart disease, stroke, heart attack, and lung infections, such as pneumonia. Cataracts. This is when the lenses in the eyes become clouded. Digestive problems. This may include peptic ulcers, heartburn, and gastroesophageal reflux disease (GERD). Oral health problems, such as gum disease, mouth sores, and tooth loss. Loss of taste and smell. Smoking also affects how you look and smell. Smoking may cause: Wrinkles. Yellow or stained teeth, fingers, and fingernails. Bad breath. Bad-smelling clothes and hair. Smoking tobacco can also affect your social life, because: It may be challenging to find places to smoke when away from home. Many workplaces, restaurants, hotels, and public places are tobacco-free. Smoking is expensive. This is due to the cost of tobacco and the long-term costs of treating health problems from smoking. Secondhand smoke may affect those around you. Secondhand smoke can cause lung cancer, breathing problems, and heart disease. Children of smokers have a higher risk for: Sudden infant death syndrome (SIDS). Ear infections. Lung infections. What  actions can I take to prevent health problems? Quit smoking  Do not start smoking. Quit if you already smoke. Do not replace cigarette smoking with vaping devices, such as e-cigarettes. Make a plan to quit smoking and commit to it. Look for programs to help you, and ask your health care provider for recommendations and ideas. Set a date and write down all the reasons you want to quit. Let your friends and family know you are quitting so they can help and support you. Consider finding friends who also want to quit. It can be easier to quit with someone else, so that you can support each other. Talk with your health care provider about using nicotine replacement medicines to help you quit. These include gum, lozenges, patches, sprays, or pills. If you try to quit but return to smoking, stay positive. It is common to slip up when you first quit, so take it one day at a time. Be prepared for cravings. When you feel the urge to smoke, chew gum or suck on hard candy. Lifestyle Stay busy. Take care of your body. Get plenty of exercise, eat a healthy diet, and drink plenty of water. Find ways to manage your stress, such as meditation, yoga, exercise, or time spent with friends and family. Ask your health care provider about having regular tests (screenings) to check for cancer. This may include blood tests, imaging tests, and other tests. Where to find support To get support to quit smoking, consider: Asking your health care provider for more information and resources. Joining a support group for people who want to quit smoking in your local community. There are many effective programs that may help you to quit. Calling the smokefree.gov counselor   helpline at 1-800-QUIT-NOW (1-800-784-8669). Where to find more information You may find more information about quitting smoking from: Centers for Disease Control and Prevention: cdc.gov/tobacco Smokefree.gov: smokefree.gov American Lung Association:  freedomfromsmoking.org Contact a health care provider if: You have problems breathing. Your lips, nose, or fingers turn blue. You have chest pain. You are coughing up blood. You feel like you will faint. You have other health changes that cause you to worry. Summary Smoking tobacco can negatively affect your health, the health of those around you, your finances, and your social life. Do not start smoking. Quit if you already smoke. If you need help quitting, ask your health care provider. Consider joining a support group for people in your local community who want to quit smoking. There are many effective programs that may help you to quit. This information is not intended to replace advice given to you by your health care provider. Make sure you discuss any questions you have with your health care provider. Document Revised: 03/31/2021 Document Reviewed: 03/31/2021 Elsevier Patient Education  2024 Elsevier Inc.  

## 2023-11-09 ENCOUNTER — Ambulatory Visit: Admitting: Orthopedic Surgery

## 2023-11-09 ENCOUNTER — Telehealth: Payer: Self-pay | Admitting: *Deleted

## 2023-11-09 ENCOUNTER — Other Ambulatory Visit: Payer: Self-pay

## 2023-11-09 DIAGNOSIS — G8929 Other chronic pain: Secondary | ICD-10-CM | POA: Diagnosis not present

## 2023-11-09 DIAGNOSIS — M25561 Pain in right knee: Secondary | ICD-10-CM

## 2023-11-09 NOTE — Telephone Encounter (Signed)
 RX to [facility] has been faxed for pt to get AFO

## 2023-11-09 NOTE — Patient Instructions (Signed)
 Schedule surgery right knee removal of bone spur medial knee

## 2023-11-09 NOTE — Progress Notes (Signed)
 Chief Complaint  Patient presents with   Knee Pain    Right- knot    History 53 year old male complains of a snapping sensation on the medial side of his right knee.  He has had this for several years it is painful.  He usually gets a Toradol  injection which helps for 1 or 2 days.  He was uninsured but became insured and now would like to get something done.  He is on Plavix .  He did have a stroke.  He is on metformin .  He is on one of the GLP-1 drugs which she did lose 25 to 30 pounds.  Review of systems some residual weakness from his stroke but he has been able to return to some normal activities  He does not report numbness and tingling related to this  Past Medical History:  Diagnosis Date   Anxiety    Chronic pain of right knee    DM type 2, controlled, with complication (HCC) 08/26/2021   Hypertension    Insomnia 01/2019   PAD (peripheral artery disease) (HCC)    Stroke [facility])    Past Surgical History:  Procedure Laterality Date   HERNIA REPAIR     INCISION AND DRAINAGE ABSCESS Left 07/21/2017   Procedure: INCISION AND DRAINAGE INGUINAL HERNIA ABSCESS;  Surgeon: Ebbie Cough, MD;  Location: [facility] OR;  Service: General;  Laterality: Left;   INGUINAL HERNIA REPAIR Bilateral 05/07/2017   Procedure: LAPAROSCOPIC RIGHT  INGUINAL HERNIA REPAIR AND  OPEN LEFT   INGUINAL EXPLORATION;  Surgeon: Rubin Calamity, MD;  Location: [facility] OR;  Service: General;  Laterality: Bilateral;   INSERTION OF MESH Bilateral 05/07/2017   Procedure: INSERTION OF MESH;  Surgeon: Rubin Calamity, MD;  Location: [facility] OR;  Service: General;  Laterality: Bilateral;   LEG SURGERY     ORCHIECTOMY Left 05/11/2023   Procedure: LEFT REMOVAL OF INGUINAL MESH;  Surgeon: Rubin Calamity, MD;  Location: [facility] ORS;  Service: General;  Laterality: Left;   Social History   Tobacco Use   Smoking status: Every Day    Current packs/day: 0.50    Average packs/day: 0.5 packs/day for 20.0 years (10.0 ttl pk-yrs)    Types:  Cigarettes   Smokeless tobacco: Never  Vaping Use   Vaping status: Never Used  Substance Use Topics   Alcohol use: Yes   Drug use: Yes    Types: Marijuana   Family History  Problem Relation Age of Onset   Stroke Brother        70s   Heart attack Sister        59s   No Known Allergies Current Outpatient Medications  Medication Instructions   Accu-Chek Softclix Lancets lancets Check up to twice daily.   atorvastatin  (LIPITOR) 80 mg, Oral, Daily   Blood Glucose Monitoring Suppl (BLOOD GLUCOSE MONITOR SYSTEM) w/Device KIT Use as directed up to twice daily.   clopidogrel  (PLAVIX ) 75 mg, Oral, Daily   fluticasone  (FLONASE ) 50 MCG/ACT nasal spray 2 sprays, Each Nare, Daily   gabapentin  (NEURONTIN ) 600 mg, Oral, 2 times daily   Glucose Blood (BLOOD GLUCOSE TEST STRIPS) STRP Check up to twice daily.   HYDROcodone -acetaminophen  (NORCO/VICODIN) 5-325 MG tablet Take 1 tablet by mouth every 4 (four) to 6 (six) hours as needed for pain   Lancet Device MISC Check up to twice daily. May substitute to any manufacturer covered by patient's insurance.   lisinopril -hydrochlorothiazide  (ZESTORETIC ) 10-12.5 MG tablet TAKE 1 TABLET BY MOUTH DAILY.   metFORMIN  (GLUCOPHAGE -XR) 1,000 mg,  Oral, Daily with breakfast   metoprolol  succinate (TOPROL -XL) 25 mg, Oral, Daily   Ozempic  (1 MG/DOSE) 1 mg, Injection, Weekly   penicillin  v potassium (VEETID) 500 MG tablet Take 1 tablet (500 mg total) by mouth every 8 (eight) hours until taken   Repatha  SureClick 140 mg, Subcutaneous, Every 14 days   Semaglutide  (OZEMPIC , 1 MG/DOSE, [place]) Inject into the skin.   Vitamin D  (Ergocalciferol ) (DRISDOL ) 50,000 Units, Oral, Every 7 days    Physical exam findings  The patient is awake alert and oriented x 3  Mood and affect are normal  Neurovascular exam is normal.  Skin exam normal.  Right knee examination  Skin warm dry intact no erythema Tenderness noted medial tibia near the area of the medial soft tissue  including pes tendons.  No evidence of bursal inflammation or swelling. Range of motion knee normal Instability test normal Muscle tone strength normal   Imaging studies were obtained with a marker.  This does not appear to be a tumor or bone spur really is just an outgrowth of the medial side of the tibia  The patient would like this removed  Other than the routine wound potential complications and recurrence no major complications are expected  Recommend excision of bone medial tibia on or around August 12

## 2023-11-09 NOTE — Progress Notes (Signed)
  Intake history:  There were no vitals taken for this visit. There is no height or weight on file to calculate BMI.    WHAT ARE WE SEEING YOU FOR TODAY?   right knee(s)  How Aaron Kennedy has this bothered you? (DOI?DOS?WS?)  Many year(s) ago  Anticoag.  No  Diabetes Yes  Heart disease No  Hypertension Yes  SMOKING HX Yes  Kidney disease No  Any ALLERGIES ______________________________________________   Treatment:  Have you taken:  Tylenol  No  Advil  No  Had PT No  Had injection No  Other  _________________________

## 2023-11-09 NOTE — Telephone Encounter (Signed)
 Pt is here now to Dr. Margrette, he mentioned when he was here last week a carbon filament brace for peroneal nerve plasty. Do you remember if we need to get this ordered. This is when I left for emergency last week.

## 2023-11-10 ENCOUNTER — Ambulatory Visit: Admitting: Podiatry

## 2023-11-10 VITALS — Ht 71.0 in | Wt 212.0 lb

## 2023-11-10 DIAGNOSIS — M7671 Peroneal tendinitis, right leg: Secondary | ICD-10-CM | POA: Diagnosis not present

## 2023-11-10 DIAGNOSIS — M25371 Other instability, right ankle: Secondary | ICD-10-CM | POA: Diagnosis not present

## 2023-11-10 DIAGNOSIS — M958 Other specified acquired deformities of musculoskeletal system: Secondary | ICD-10-CM

## 2023-11-10 DIAGNOSIS — M76821 Posterior tibial tendinitis, right leg: Secondary | ICD-10-CM

## 2023-11-10 NOTE — Progress Notes (Signed)
 Subjective:  Patient ID: Aaron Kennedy, male    DOB: 1970/09/29,  MRN: [id number]  Chief Complaint  Patient presents with   Arthritis    RM 1 Patient is here for arthritis of the right ankle. Patient is here for possible surgical consult.    53 y.o. male presents with the above complaint. History confirmed with patient.  He returns for follow-up referred to me by Dr. Malvin for surgical correction of his chronic right ankle issue.  This been a longstanding injury that has worsened over time.  He has reduced his A1c to 6.5 and would like to proceed with surgical correction.  Most of the pain is on the inside and outside of the ankle.  Objective:  Physical Exam: warm, good capillary refill, no trophic changes or ulcerative lesions, normal DP and PT pulses, normal sensory exam, and right ankle pain to palpation on the anterior joint line, medial deltoid and posterior tibial tendon, lateral ankle ligaments and peroneal tendons..  Previous right ankle radiographs shows malunited deltoid avulsion fracture  IMPRESSION: 1. Tibialis posterior tenosynovitis and moderate distal tendinopathy. 2. Mild flexor digitorum longus and flexor hallucis longus tenosynovitis. 3. 3 by 15 mm osteochondral lesion of the lateral talar dome manifesting primarily as subcortical marrow edema. 4. Irregular deep tibiotalar portion of the deltoid ligament, possibly previously torn. There is arthropathy between the medial malleolus and the adjacent attachment site of the tibiotalar portion of the deltoid ligament in the talus. Small ossicles noted in this vicinity below the medial malleolus and may have some mild internal edema signal. 5. Thickened slightly indistinct anterior inferior tibiofibular ligament possibly from an old injury. Attenuated and poorly seen anterior talofibular ligament, query remote tear. 6. 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     Electronically Signed   By: Ryan Salvage M.D.   On: 03/17/2023 14:04 Assessment:   1. Ankle instability, right   2. Osteochondral defect of talus   3. Peroneal tendinitis, right   4. Posterior tibial tendonitis, right      Plan:  Patient was evaluated and treated and all questions answered.  Reviewed his MRI results radiographs and his clinical progress and clinical exam findings.  We discussed the previous injury to the ankle resulted in instability inflammation and ongoing pain.  He does have an osteochondral defect in the joint on his MRI as well as tears of the deltoid and ATFL.  We discussed surgical nonsurgical treatment options.  Surgical we discussed ankle arthroscopy, lateral and medial ankle stabilization and inspection and repair of the peroneal and posterior tibial tendons respectively.  We discussed the risk benefits and potential complications that could result from surgery including but not limited to  pain, swelling, infection, scar, numbness which may be temporary or permanent, chronic pain, stiffness, nerve pain or damage, wound healing problems, bone healing problems including delayed or non-union.  He understands and wishes to proceed.  We discussed the impact of his blood sugar on surgery and as long as his A1c remains well-controlled we will be able to proceed.  We also discussed the impact of smoking and I recommended he cease smoking 1 month before and after surgery which he said he is able to do he has done this before for his femur fracture, all questions addressed today.  Informed consent signed and reviewed.  Surgery will be scheduled at a mutually agreeable date.   Surgical plan:  Procedure: -  Right ankle arthroscopy, treatment of OCD, platelet rich plasma injection, medial lateral ankle ligament repairs and tendon repairs  Location: - [facility]  Anesthesia plan: - General  with block  Postoperative pain plan: -  Tylenol  1000 mg every 6 hours, ibuprofen  600 mg every 6 hours, gabapentin  300 mg every 8 hours x5 days, oxycodone  5 mg 1-2 tabs every 6 hours only as needed  DVT prophylaxis: - Xarelto 10 mg nightly postop  WB Restrictions / DME needs: - None required    No follow-ups on file.

## 2023-11-14 ENCOUNTER — Telehealth: Payer: Self-pay | Admitting: Radiology

## 2023-11-14 ENCOUNTER — Other Ambulatory Visit: Payer: Self-pay | Admitting: Orthopedic Surgery

## 2023-11-14 DIAGNOSIS — M779 Enthesopathy, unspecified: Secondary | ICD-10-CM

## 2023-11-14 DIAGNOSIS — G8929 Other chronic pain: Secondary | ICD-10-CM

## 2023-11-14 DIAGNOSIS — Z01818 Encounter for other preprocedural examination: Secondary | ICD-10-CM

## 2023-11-14 NOTE — Telephone Encounter (Signed)
 I called him told him Dr Margrette advised hold the Plavix  5 d for the surgery he voiced understanding.

## 2023-11-14 NOTE — Telephone Encounter (Signed)
 Called patient d/c Ozempic  8 d prior to surgery  He is on Plavix  did you discuss with him?

## 2023-11-14 NOTE — Telephone Encounter (Signed)
 He said he already stopped Plavix  I told him he doesn't need to stop for that long, how long does he need to hold?

## 2023-11-14 NOTE — Telephone Encounter (Signed)
-----   Message from [name] sent at 11/09/2023 11:58 AM EDT ----- AUG 12TH SURGERY RT KNEE REMOVE SPUR

## 2023-11-15 ENCOUNTER — Other Ambulatory Visit: Payer: Self-pay

## 2023-11-15 ENCOUNTER — Other Ambulatory Visit (HOSPITAL_COMMUNITY): Payer: Self-pay

## 2023-11-15 MED ORDER — TRAMADOL HCL 50 MG PO TABS
50.0000 mg | ORAL_TABLET | Freq: Two times a day (BID) | ORAL | 0 refills | Status: DC
  Filled 2023-11-15: qty 10, 5d supply, fill #0

## 2023-11-15 MED ORDER — TRAMADOL HCL 50 MG PO TABS
50.0000 mg | ORAL_TABLET | Freq: Two times a day (BID) | ORAL | 0 refills | Status: DC | PRN
  Filled 2023-11-15: qty 60, 30d supply, fill #0

## 2023-11-22 ENCOUNTER — Other Ambulatory Visit: Payer: Self-pay

## 2023-11-23 ENCOUNTER — Other Ambulatory Visit: Payer: Self-pay

## 2023-11-23 ENCOUNTER — Telehealth: Payer: Self-pay

## 2023-11-23 ENCOUNTER — Telehealth: Payer: Self-pay | Admitting: Radiology

## 2023-11-23 NOTE — Telephone Encounter (Signed)
 Call patient his medicaid has denied his surgery.

## 2023-11-23 NOTE — Telephone Encounter (Signed)
 I called him to discuss again We discussed again  He voiced understanding again.

## 2023-11-23 NOTE — Telephone Encounter (Signed)
 This pt lvm stating that he spoke w/his insurance and he stated that they told him that we need to contact [employer] and that it's showing pending, not denied.  He is rude and thinks someone should grabs his call immediately when he calls.  He would like a call back.  [phone]

## 2023-11-23 NOTE — Telephone Encounter (Signed)
 I called patient to advise him surgery denied by Medicaid will cancel Per Medicaid they will not approve since his knee should be catching, locking, or giving out in order to be approved for the surgery  I let the hospital know  To you FYI

## 2023-11-23 NOTE — Telephone Encounter (Signed)
 I have received denial from Medicaid He will get copy in the mail.  I spoke to him already will call him again to discuss again.

## 2023-11-23 NOTE — Telephone Encounter (Signed)
 Copied from CRM [id number]. Topic: Referral - Question >> Nov 23, 2023  2:31 PM Aaron Kennedy wrote: Reason for CRM: Pt called in to report he is having issues with his current referral to Ortho but are not doing what they are supposed to.    Pt would like to another referral to ortho so he can be assisted and also an Ortho that can order the MRI and/or CT Scan.   Pt callback [phone]

## 2023-11-24 ENCOUNTER — Telehealth: Payer: Self-pay

## 2023-11-24 ENCOUNTER — Other Ambulatory Visit: Payer: Self-pay | Admitting: Orthopedic Surgery

## 2023-11-24 ENCOUNTER — Telehealth: Payer: Self-pay | Admitting: Radiology

## 2023-11-24 ENCOUNTER — Other Ambulatory Visit: Payer: Self-pay | Admitting: Nurse Practitioner

## 2023-11-24 DIAGNOSIS — G8929 Other chronic pain: Secondary | ICD-10-CM

## 2023-11-24 DIAGNOSIS — M25552 Pain in left hip: Secondary | ICD-10-CM

## 2023-11-24 DIAGNOSIS — M779 Enthesopathy, unspecified: Secondary | ICD-10-CM

## 2023-11-24 DIAGNOSIS — Z01818 Encounter for other preprocedural examination: Secondary | ICD-10-CM

## 2023-11-24 NOTE — Telephone Encounter (Signed)
 Called Aaron Kennedy to see if we can put in original spot  No answer/ I put in new orders sent her a message

## 2023-11-24 NOTE — Telephone Encounter (Signed)
 [id number] is correct code

## 2023-11-24 NOTE — Telephone Encounter (Signed)
 Copied from CRM [id number]. Topic: General - Other >> Nov 24, 2023  1:41 PM Santiya F wrote: Reason for CRM: Patient is calling in because his surgery was denied due to the wrong code being put into the system. Patient says he no longer needs his provider to send another referral because the correct code has been put in and they are moving forward with the surgery.   Just FYI. KH

## 2023-11-24 NOTE — Telephone Encounter (Signed)
     11/14/23  9:55 AM Note ----- Message from [name] sent at 11/09/2023 11:58 AM EDT ----- AUG 12TH SURGERY RT KNEE REMOVE SPUR       I coded as arthroscopic debridement Dr Minerva has told me this is incorrect code for surgey Surgery cancelled Dr Minerva will tell me correct code Patient came in yesterday I told him I am looking for correct code.

## 2023-11-24 NOTE — Telephone Encounter (Signed)
 I have provided Aaron Kennedy with a corrected code  Patient advised yesterday that we are working on it and I will let him know when approved with new code, though I dont think he was listening very much he was very angry about situation.

## 2023-11-24 NOTE — Telephone Encounter (Signed)
 Aaron Kennedy got in touch with him and got him back on the schedule

## 2023-11-24 NOTE — Patient Instructions (Addendum)
 Aaron Kennedy  11/24/2023     @PREFPERIOPPHARMACY @   Your procedure is scheduled on  11/29/2023.   Report to [facility] at  1100  A.M.   Call this number if you have problems the morning of surgery:  [phone]  If you experience any cold or flu symptoms such as cough, fever, chills, shortness of breath, etc. between now and your scheduled surgery, please notify us  at the above number.   Remember:        Your las dose of semaglutide  should be on 11/21/2023.        Your last dose of plavix  should be on 11/23/2023.         DO NOT take any medications for diabetes the morning of your procedure.   Do not eat after midnight.   You may drink clear liquids until 0900 am on 11/29/2023.    Clear liquids allowed are:                    Water , Juice (No red color; non-citric and without pulp; diabetics please choose diet or no sugar options), Carbonated beverages (diabetics please choose diet or no sugar options), Clear Tea (No creamer, milk, or cream, including half & half and powdered creamer), Black Coffee Only (No creamer, milk or cream, including half & half and powdered creamer), and Clear Sports drink (No red color; diabetics please choose diet or no sugar options)    Take these medicines the morning of surgery with A SIP OF WATER                                  gabapentin , metoprolol .    Do not wear jewelry, make-up or nail polish, including gel polish,  artificial nails, or any other type of covering on natural nails (fingers and  toes).  Do not wear lotions, powders, or perfumes, or deodorant.  Do not shave 48 hours prior to surgery.  Men may shave face and neck.  Do not bring valuables to the hospital.  [facility] is not responsible for any belongings or valuables.  Contacts, dentures or bridgework may not be worn into surgery.  Leave your suitcase in the car.  After surgery it may be brought to your room.  For patients admitted to the hospital, discharge  time will be determined by your treatment team.  Patients discharged the day of surgery will not be allowed to drive home and must have someone with them for 24 hours.    Special instructions:   DO NOT smoke tobacco or vape for 24 hours before your procedure.  Please read over the following fact sheets that you were given. Pain Booklet, Coughing and Deep Breathing, Surgical Site Infection Prevention, Anesthesia Post-op Instructions, and Care and Recovery After Surgery      Incision Care, Adult An incision is a cut that a doctor makes in your skin for surgery. Most times, these cuts are closed after surgery. Your cut from surgery may be closed with: Stitches (sutures). Staples. Skin glue. Skin tape (adhesive) strips. You may need to go back to your doctor to have stitches or staples taken out. This may happen many days or many weeks after your surgery. You need to take good care of your cut so it does not get infected. Follow instructions from your doctor about how to care for your cut. Supplies needed:  Soap and water . A clean hand towel. Wound cleanser. A clean bandage (dressing), if needed. Cream or ointment, if told by your doctor. Clean gauze. How to care for your cut from surgery Cleaning your cut Ask your doctor how to clean your cut. You may need to: Wear medical gloves. Use mild soap and water , or a wound cleanser. Use a clean gauze to pat your cut dry after you clean it. Changing your bandage Wash your hands with soap and water  for at least 20 seconds before and after you change your bandage. If you cannot use soap and water , use hand sanitizer. Do not usedisinfectants or antiseptics, such as rubbing alcohol, to clean your wound unless told by your doctor. Change your bandage as told by your doctor. Leavestitches or skin glue in place for at least 2 weeks. Leave tape strips alone unless you are told to take them off. You may trim the edges of the tape strips if they curl  up. Put a cream or ointment on your cut. Do this only as told. Cover your cut with a clean bandage. Ask your doctor when you can leave your cut uncovered. Checking for infection Check your cut area every day for signs of infection. Check for: More redness, swelling, or pain. More fluid or blood. New warmth. Hardness or a new rash around the incision. Pus or a bad smell.  Follow these instructions at home Medicines Take over-the-counter and prescription medicines only as told by your doctor. If you were prescribed an antibiotic medicine, cream, or ointment, use it as told by your doctor. Do not stop using the antibiotic even if you start to feel better. Eating and drinking Eat foods that have a lot of certain nutrients, such as protein, vitamin A, and vitamin C. These foods help your cut heal. Foods rich in protein include meat, fish, eggs, dairy, beans, nuts, and protein drinks. Foods rich in vitamin A include carrots and dark green, leafy vegetables. Foods rich in vitamin C include citrus fruits, tomatoes, broccoli, and peppers. Drink enough fluid to keep your pee (urine) pale yellow. General instructions  Do not take baths, swim, or use a hot tub. Ask your doctor about taking showers or sponge baths. Limit movement around your cut. This helps with healing. Try not to strain, lift, or exercise for the first 2 weeks, or for as long as told by your doctor. Return to your normal activities as told by your doctor. Ask your doctor what activities are safe for you. Do not scratch, scrub, or pick at your cut. Keep it covered as told by your doctor. Protect your cut from the sun when you are outside for the first 6 months, or for as long as told by your doctor. Cover up the scar area or put on sunscreen that has an SPF of at least 30. Do not use any products that contain nicotine  or tobacco, such as cigarettes, e-cigarettes, and chewing tobacco. These can delay cut healing. If you need help  quitting, ask your doctor. Keep all follow-up visits. Contact a doctor if: You have any of these signs of infection around your cut: More redness, swelling, or pain. More fluid or blood. New warmth or hardness. Pus or a bad smell. A new rash. You have a fever. You feel like you may vomit (nauseous). You vomit. You are dizzy. Your stitches, staples, skin glue, or tape strips come undone. Your cut gets bigger. You have a fever. Get help right away if: Your cut  bleeds through your bandage, and bleeding does not stop with gentle pressure. Your cut opens up and comes apart. These symptoms may be an emergency. Do not wait to see if the symptoms will go away. Get medical help right away. Call your local emergency services (911 in the [place] Do not drive yourself to the hospital. Summary Follow instructions from your doctor about how to care for your cut. Wash your hands with soap and water  for at least 20 seconds before and after you change your bandage. If you cannot use soap and water , use hand sanitizer. Check your cut area every day for signs of infection. Keep all follow-up visits. This information is not intended to replace advice given to you by your health care provider. Make sure you discuss any questions you have with your health care provider. Document Revised: 07/07/2020 Document Reviewed: 07/07/2020 Elsevier Patient Education  2024 Else[employer] Inc.General Anesthesia, Adult, Care After The following information offers guidance on how to care for yourself after your procedure. Your health care provider may also give you more specific instructions. If you have problems or questions, contact your health care provider. What can I expect after the procedure? After the procedure, it is common for people to: Have pain or discomfort at the IV site. Have nausea or vomiting. Have a sore throat or hoarseness. Have trouble concentrating. Feel cold or chills. Feel weak, sleepy, or tired  (fatigue). Have soreness and body aches. These can affect parts of the body that were not involved in surgery. Follow these instructions at home: For the time period you were told by your health care provider:  Rest. Do not participate in activities where you could fall or become injured. Do not drive or use machinery. Do not drink alcohol. Do not take sleeping pills or medicines that cause drowsiness. Do not make important decisions or sign legal documents. Do not take care of children on your own. General instructions Drink enough fluid to keep your urine pale yellow. If you have sleep apnea, surgery and certain medicines can increase your risk for breathing problems. Follow instructions from your health care provider about wearing your sleep device: Anytime you are sleeping, including during daytime naps. While taking prescription pain medicines, sleeping medicines, or medicines that make you drowsy. Return to your normal activities as told by your health care provider. Ask your health care provider what activities are safe for you. Take over-the-counter and prescription medicines only as told by your health care provider. Do not use any products that contain nicotine  or tobacco. These products include cigarettes, chewing tobacco, and vaping devices, such as e-cigarettes. These can delay incision healing after surgery. If you need help quitting, ask your health care provider. Contact a health care provider if: You have nausea or vomiting that does not get better with medicine. You vomit every time you eat or drink. You have pain that does not get better with medicine. You cannot urinate or have bloody urine. You develop a skin rash. You have a fever. Get help right away if: You have trouble breathing. You have chest pain. You vomit blood. These symptoms may be an emergency. Get help right away. Call 911. Do not wait to see if the symptoms will go away. Do not drive yourself to the  hospital. Summary After the procedure, it is common to have a sore throat, hoarseness, nausea, vomiting, or to feel weak, sleepy, or fatigue. For the time period you were told by your health care provider, do not  drive or use machinery. Get help right away if you have difficulty breathing, have chest pain, or vomit blood. These symptoms may be an emergency. This information is not intended to replace advice given to you by your health care provider. Make sure you discuss any questions you have with your health care provider. Document Revised: 07/03/2021 Document Reviewed: 07/03/2021 Elsevier Patient Education  2024 Else[employer] Inc. How to Use Chlorhexidine  at Home in the Shower Chlorhexidine  gluconate (CHG) is a germ-killing (antiseptic) wash that's used to clean the skin. It can get rid of the germs that normally live on the skin and can keep them away for about 24 hours. If you're having surgery, you may be told to shower with CHG at home the night before surgery. This can help lower your risk for infection. To use CHG wash in the shower, follow the steps below. Supplies needed: CHG body wash. Clean washcloth. Clean towel. How to use CHG in the shower Follow these steps unless you're told to use CHG in a different way: Start the shower. Use your normal soap and shampoo to wash your face and hair. Turn off the shower or move out of the shower stream. Pour CHG onto a clean washcloth. Do not use any type of brush or rough sponge. Start at your neck, washing your body down to your toes. Make sure you: Wash the part of your body where the surgery will be done for at least 1 minute. Do not scrub. Do not use CHG on your head or face unless your health care provider tells you to. If it gets into your ears or eyes, rinse them well with water . Do not wash your genitals with CHG. Wash your back and under your arms. Make sure to wash skin folds. Let the CHG sit on your skin for 1-2 minutes or as long  as told. Rinse your entire body in the shower, including all body creases and folds. Turn off the shower. Dry off with a clean towel. Do not put anything on your skin afterward, such as powder, lotion, or perfume. Put on clean clothes or pajamas. If it's the night before surgery, sleep in clean sheets. General tips Use CHG only as told, and follow the instructions on the label. Use the full amount of CHG as told. This is often one bottle. Do not smoke and stay away from flames after using CHG. Your skin may feel sticky after using CHG. This is normal. The sticky feeling will go away as the CHG dries. Do not use CHG: If you have a chlorhexidine  allergy or have reacted to chlorhexidine  in the past. On open wounds or areas of skin that have broken skin, cuts, or scrapes. On babies younger than 15 months of age. Contact a health care provider if: You have questions about using CHG. Your skin gets irritated or itchy. You have a rash after using CHG. You swallow any CHG. Call your local poison control center [phone] in the [place] Your eyes itch badly, or they become very red or swollen. Your hearing changes. You have trouble seeing. If you can't reach your provider, go to an urgent care or emergency room. Do not drive yourself. Get help right away if: You have swelling or tingling in your mouth or throat. You make high-pitched whistling sounds when you breathe, most often when you breathe out (wheeze). You have trouble breathing. These symptoms may be an emergency. Call 911 right away. Do not wait to see if the symptoms will  go away. Do not drive yourself to the hospital. This information is not intended to replace advice given to you by your health care provider. Make sure you discuss any questions you have with your health care provider. Document Revised: 10/19/2022 Document Reviewed: 10/15/2021 Elsevier Patient Education  2024 Elsevier Inc.How to Use an Incentive Spirometer An  incentive spirometer is a tool that measures how well you are filling your lungs with each breath. Learning to take long, deep breaths using this tool can help you keep your lungs clear and active. This may help to reverse or lessen your chance of developing breathing (pulmonary) problems, especially infection. You may be asked to use a spirometer: After a surgery. If you have a lung problem or a history of smoking. After a long period of time when you have been unable to move or be active. If the spirometer includes an indicator to show the highest number that you have reached, your health care provider or respiratory therapist will help you set a goal. Keep a log of your progress as told by your health care provider. What are the risks? Breathing too quickly may cause dizziness or cause you to pass out. Take your time so you do not get dizzy or light-headed. If you are in pain, you may need to take pain medicine before doing incentive spirometry. It is harder to take a deep breath if you are having pain. How to use your incentive spirometer  Sit up on the edge of your bed or on a chair. Hold the incentive spirometer so that it is in an upright position. Before you use the spirometer, breathe out normally. Place the mouthpiece in your mouth. Make sure your lips are closed tightly around it. Breathe in slowly and as deeply as you can through your mouth, causing the piston or the ball to rise toward the top of the chamber. Hold your breath for 3-5 seconds, or for as long as possible. If the spirometer includes a coach indicator, use this to guide you in breathing. Slow down your breathing if the indicator goes above the marked areas. Remove the mouthpiece from your mouth and breathe out normally. The piston or ball will return to the bottom of the chamber. Rest for a few seconds, then repeat the steps 10 or more times. Take your time and take a few normal breaths between deep breaths so that you do  not get dizzy or light-headed. Do this every 1-2 hours when you are awake. If the spirometer includes a goal marker to show the highest number you have reached (best effort), use this as a goal to work toward during each repetition. After each set of 10 deep breaths, cough a few times. This will help to make sure that your lungs are clear. If you have an incision on your chest or abdomen from surgery, place a pillow or a rolled-up towel firmly against the incision when you cough. This can help to reduce pain while taking deep breaths and coughing. General tips When you are able to get out of bed: Walk around often. Continue to take deep breaths and cough in order to clear your lungs. Keep using the incentive spirometer until your health care provider says it is okay to stop using it. If you have been in the hospital, you may be told to keep using the spirometer at home. Contact a health care provider if: You are having difficulty using the spirometer. You have trouble using the spirometer as often  as instructed. Your pain medicine is not giving enough relief for you to use the spirometer as told. You have a fever. Get help right away if: You develop shortness of breath. You develop a cough with bloody mucus from the lungs. You have fluid or blood coming from an incision site after you cough. Summary An incentive spirometer is a tool that can help you learn to take long, deep breaths to keep your lungs clear and active. You may be asked to use a spirometer after a surgery, if you have a lung problem or a history of smoking, or if you have been inactive for a long period of time. Use your incentive spirometer as instructed every 1-2 hours while you are awake. If you have an incision on your chest or abdomen, place a pillow or a rolled-up towel firmly against your incision when you cough. This will help to reduce pain. Get help right away if you have shortness of breath, you cough up bloody  mucus, or blood comes from your incision when you cough. This information is not intended to replace advice given to you by your health care provider. Make sure you discuss any questions you have with your health care provider. Document Revised: 02/11/2023 Document Reviewed: 02/11/2023 Elsevier Patient Education  2024 [employer].

## 2023-11-24 NOTE — Telephone Encounter (Signed)
 I got corrected code from Dr Margrette And that one does not need approval  I will call patient and get him RS

## 2023-11-24 NOTE — Telephone Encounter (Signed)
 Elveria called him Got him back on schedule for next week and got his pre op scheduled  To you FYI

## 2023-11-25 ENCOUNTER — Encounter (HOSPITAL_COMMUNITY): Payer: Self-pay

## 2023-11-25 ENCOUNTER — Encounter (HOSPITAL_COMMUNITY): Admission: RE | Admit: 2023-11-25 | Source: Ambulatory Visit

## 2023-11-25 ENCOUNTER — Encounter (HOSPITAL_COMMUNITY)
Admission: RE | Admit: 2023-11-25 | Discharge: 2023-11-25 | Disposition: A | Discharge status: Home / Self Care | Source: Ambulatory Visit | Attending: Orthopedic Surgery | Admitting: Orthopedic Surgery

## 2023-11-25 ENCOUNTER — Other Ambulatory Visit: Payer: Self-pay

## 2023-11-25 VITALS — BP 134/97 | HR 81 | Resp 18 | Ht 71.0 in | Wt 212.0 lb

## 2023-11-25 DIAGNOSIS — Z01818 Encounter for other preprocedural examination: Secondary | ICD-10-CM | POA: Insufficient documentation

## 2023-11-25 DIAGNOSIS — M25561 Pain in right knee: Secondary | ICD-10-CM | POA: Insufficient documentation

## 2023-11-25 DIAGNOSIS — M779 Enthesopathy, unspecified: Secondary | ICD-10-CM | POA: Diagnosis not present

## 2023-11-25 DIAGNOSIS — E118 Type 2 diabetes mellitus with unspecified complications: Secondary | ICD-10-CM | POA: Insufficient documentation

## 2023-11-25 DIAGNOSIS — G8929 Other chronic pain: Secondary | ICD-10-CM | POA: Insufficient documentation

## 2023-11-25 LAB — CBC WITH DIFFERENTIAL/PLATELET
Abs Immature Granulocytes: 0.03 K/uL (ref 0.00–0.07)
Basophils Absolute: 0.1 K/uL (ref 0.0–0.1)
Basophils Relative: 1 %
Eosinophils Absolute: 0.2 K/uL (ref 0.0–0.5)
Eosinophils Relative: 2 %
HCT: 44.8 % (ref 39.0–52.0)
Hemoglobin: 15.6 g/dL (ref 13.0–17.0)
Immature Granulocytes: 0 %
Lymphocytes Relative: 31 %
Lymphs Abs: 2.8 K/uL (ref 0.7–4.0)
MCH: 34.2 pg — ABNORMAL HIGH (ref 26.0–34.0)
MCHC: 34.8 g/dL (ref 30.0–36.0)
MCV: 98.2 fL (ref 80.0–100.0)
Monocytes Absolute: 0.6 K/uL (ref 0.1–1.0)
Monocytes Relative: 7 %
Neutro Abs: 5.2 K/uL (ref 1.7–7.7)
Neutrophils Relative %: 59 %
Platelets: 241 K/uL (ref 150–400)
RBC: 4.56 MIL/uL (ref 4.22–5.81)
RDW: 13.1 % (ref 11.5–15.5)
WBC: 8.9 K/uL (ref 4.0–10.5)
nRBC: 0 % (ref 0.0–0.2)

## 2023-11-25 LAB — BASIC METABOLIC PANEL WITH GFR
Anion gap: 10 (ref 5–15)
BUN: 12 mg/dL (ref 6–20)
CO2: 21 mmol/L — ABNORMAL LOW (ref 22–32)
Calcium: 8.6 mg/dL — ABNORMAL LOW (ref 8.9–10.3)
Chloride: 105 mmol/L (ref 98–111)
Creatinine, Ser: 0.8 mg/dL (ref 0.61–1.24)
GFR, Estimated: >60 mL/min (ref >60)
Glucose, Bld: 212 mg/dL — ABNORMAL HIGH (ref 70–99)
Potassium: 3.8 mmol/L (ref 3.5–5.1)
Sodium: 136 mmol/L (ref 135–145)

## 2023-11-26 LAB — HEMOGLOBIN A1C
Hgb A1c MFr Bld: 6.6 % — ABNORMAL HIGH (ref 4.8–5.6)
Mean Plasma Glucose: 143 mg/dL

## 2023-11-28 NOTE — H&P (Signed)
 Chief Complaint  Patient presents with   Knee Pain      Right- knot      History 53 year old male complains of a snapping sensation on the medial side of his right knee.  He has had this for several years it is painful.  He usually gets a Toradol  injection which helps for 1 or 2 days.  He was uninsured but became insured and now would like to get something done.  He is on Plavix .  He did have a stroke.  He is on metformin .  He is on one of the GLP-1 drugs which she did lose 25 to 30 pounds.   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/26/2021   Hypertension     Insomnia 01/2019   PAD (peripheral artery disease) (HCC)     Stroke [facility])               Past Surgical History:  Procedure Laterality Date   HERNIA REPAIR       INCISION AND DRAINAGE ABSCESS Left 07/21/2017    Procedure: INCISION AND DRAINAGE INGUINAL HERNIA ABSCESS;  Surgeon: Ebbie Cough, MD;  Location: [facility] OR;  Service: General;  Laterality: Left;   INGUINAL HERNIA REPAIR Bilateral 05/07/2017    Procedure: LAPAROSCOPIC RIGHT  INGUINAL HERNIA REPAIR AND  OPEN LEFT   INGUINAL EXPLORATION;  Surgeon: Rubin Calamity, MD;  Location: [facility] OR;  Service: General;  Laterality: Bilateral;   INSERTION OF MESH Bilateral 05/07/2017    Procedure: INSERTION OF MESH;  Surgeon: Rubin Calamity, MD;  Location: [facility] OR;  Service: General;  Laterality: Bilateral;   LEG SURGERY       ORCHIECTOMY Left 05/11/2023    Procedure: LEFT REMOVAL OF INGUINAL MESH;  Surgeon: Rubin Calamity, MD;  Location: [facility] ORS;  Service: General;  Laterality: Left;        Social History  Social History         Tobacco Use   Smoking status: Every Day      Current packs/day: 0.50       Average packs/day: 0.5 packs/day for 20.0 years (10.0 ttl pk-yrs)      Types: Cigarettes   Smokeless tobacco: Never  Vaping Use   Vaping status: Never Used  Substance Use Topics   Alcohol use: Yes   Drug use: Yes      Types: Marijuana           Family History  Problem Relation Age of Onset   Stroke Brother          56s   Heart attack Sister          24s        Allergies  No Known Allergies       Current Outpatient Medications  Medication Instructions   Accu-Chek Softclix Lancets lancets Check up to twice daily.   atorvastatin  (LIPITOR) 80 mg, Oral, Daily   Blood Glucose Monitoring Suppl (BLOOD GLUCOSE MONITOR SYSTEM) w/Device KIT Use as directed up to twice daily.   clopidogrel  (PLAVIX ) 75 mg, Oral, Daily   fluticasone  (FLONASE ) 50 MCG/ACT nasal spray 2 sprays, Each Nare, Daily  gabapentin  (NEURONTIN ) 600 mg, Oral, 2 times daily   Glucose Blood (BLOOD GLUCOSE TEST STRIPS) STRP Check up to twice daily.   HYDROcodone -acetaminophen  (NORCO/VICODIN) 5-325 MG tablet Take 1 tablet by mouth every 4 (four) to 6 (six) hours as needed for pain   Lancet Device MISC Check up to twice daily. May substitute to any manufacturer covered by patient's insurance.   lisinopril -hydrochlorothiazide  (ZESTORETIC ) 10-12.5 MG tablet TAKE 1 TABLET BY MOUTH DAILY.   metFORMIN  (GLUCOPHAGE -XR) 1,000 mg, Oral, Daily with breakfast   metoprolol  succinate (TOPROL -XL) 25 mg, Oral, Daily   Ozempic  (1 MG/DOSE) 1 mg, Injection, Weekly   penicillin  v potassium (VEETID) 500 MG tablet Take 1 tablet (500 mg total) by mouth every 8 (eight) hours until taken   Repatha  SureClick 140 mg, Subcutaneous, Every 14 days   Semaglutide  (OZEMPIC , 1 MG/DOSE, [place]) Inject into the skin.   Vitamin D  (Ergocalciferol ) (DRISDOL ) 50,000 Units, Oral, Every 7 days      Physical exam findings   The patient is awake alert and oriented x 3   Mood and affect are normal   Neurovascular exam is normal.   Skin exam normal.   Right  knee examination   Skin warm dry intact no erythema Tenderness noted medial tibia near the area of the medial soft tissue including pes tendons.  No evidence of bursal inflammation or swelling. Range of motion knee normal Instability test normal Muscle tone strength normal     Imaging studies were obtained with a marker.  This does not appear to be a tumor or bone spur really is just an outgrowth of the medial side of the tibia   The patient would like this removed   Other than the routine wound potential complications and recurrence no major complications are expected   Recommend excision of bone medial tibia on or around August 12

## 2023-11-29 ENCOUNTER — Ambulatory Visit (HOSPITAL_COMMUNITY)
Admission: RE | Admit: 2023-11-29 | Discharge: 2023-11-29 | Disposition: A | Discharge status: Home / Self Care | Attending: Orthopedic Surgery | Admitting: Orthopedic Surgery

## 2023-11-29 ENCOUNTER — Encounter (HOSPITAL_COMMUNITY): Admission: RE | Payer: Self-pay | Source: Home / Self Care

## 2023-11-29 ENCOUNTER — Ambulatory Visit (HOSPITAL_COMMUNITY): Admitting: Anesthesiology

## 2023-11-29 ENCOUNTER — Other Ambulatory Visit: Payer: Self-pay

## 2023-11-29 ENCOUNTER — Encounter (HOSPITAL_COMMUNITY)
Admission: RE | Disposition: A | Discharge status: Home / Self Care | Payer: Self-pay | Source: Home / Self Care | Attending: Orthopedic Surgery

## 2023-11-29 ENCOUNTER — Ambulatory Visit (HOSPITAL_COMMUNITY): Admission: RE | Admit: 2023-11-29 | Source: Home / Self Care | Admitting: Orthopedic Surgery

## 2023-11-29 DIAGNOSIS — I10 Essential (primary) hypertension: Secondary | ICD-10-CM | POA: Insufficient documentation

## 2023-11-29 DIAGNOSIS — E118 Type 2 diabetes mellitus with unspecified complications: Secondary | ICD-10-CM

## 2023-11-29 DIAGNOSIS — Z7984 Long term (current) use of oral hypoglycemic drugs: Secondary | ICD-10-CM | POA: Insufficient documentation

## 2023-11-29 DIAGNOSIS — M89261 Other disorders of bone development and growth, right tibia: Secondary | ICD-10-CM | POA: Diagnosis not present

## 2023-11-29 DIAGNOSIS — D1621 Benign neoplasm of long bones of right lower limb: Secondary | ICD-10-CM | POA: Insufficient documentation

## 2023-11-29 DIAGNOSIS — M7751 Other enthesopathy of right foot: Secondary | ICD-10-CM

## 2023-11-29 DIAGNOSIS — E669 Obesity, unspecified: Secondary | ICD-10-CM | POA: Insufficient documentation

## 2023-11-29 DIAGNOSIS — I69359 Hemiplegia and hemiparesis following cerebral infarction affecting unspecified side: Secondary | ICD-10-CM | POA: Diagnosis not present

## 2023-11-29 DIAGNOSIS — F1290 Cannabis use, unspecified, uncomplicated: Secondary | ICD-10-CM | POA: Diagnosis not present

## 2023-11-29 DIAGNOSIS — E785 Hyperlipidemia, unspecified: Secondary | ICD-10-CM | POA: Diagnosis not present

## 2023-11-29 DIAGNOSIS — D1631 Benign neoplasm of short bones of right lower limb: Secondary | ICD-10-CM | POA: Diagnosis not present

## 2023-11-29 DIAGNOSIS — G8929 Other chronic pain: Secondary | ICD-10-CM | POA: Diagnosis not present

## 2023-11-29 DIAGNOSIS — M25561 Pain in right knee: Secondary | ICD-10-CM | POA: Insufficient documentation

## 2023-11-29 DIAGNOSIS — F17210 Nicotine dependence, cigarettes, uncomplicated: Secondary | ICD-10-CM | POA: Diagnosis not present

## 2023-11-29 DIAGNOSIS — F419 Anxiety disorder, unspecified: Secondary | ICD-10-CM

## 2023-11-29 DIAGNOSIS — F17200 Nicotine dependence, unspecified, uncomplicated: Secondary | ICD-10-CM | POA: Diagnosis not present

## 2023-11-29 DIAGNOSIS — Z8673 Personal history of transient ischemic attack (TIA), and cerebral infarction without residual deficits: Secondary | ICD-10-CM | POA: Diagnosis not present

## 2023-11-29 DIAGNOSIS — Z7902 Long term (current) use of antithrombotics/antiplatelets: Secondary | ICD-10-CM | POA: Diagnosis not present

## 2023-11-29 DIAGNOSIS — Z7985 Long-term (current) use of injectable non-insulin antidiabetic drugs: Secondary | ICD-10-CM | POA: Diagnosis not present

## 2023-11-29 DIAGNOSIS — E119 Type 2 diabetes mellitus without complications: Secondary | ICD-10-CM | POA: Diagnosis not present

## 2023-11-29 DIAGNOSIS — M898X6 Other specified disorders of bone, lower leg: Secondary | ICD-10-CM | POA: Diagnosis not present

## 2023-11-29 DIAGNOSIS — M76891 Other specified enthesopathies of right lower limb, excluding foot: Secondary | ICD-10-CM | POA: Diagnosis present

## 2023-11-29 HISTORY — PX: EXCISION MASS LOWER EXTREMETIES: SHX6705

## 2023-11-29 LAB — GLUCOSE, CAPILLARY: Glucose-Capillary: 129 mg/dL — ABNORMAL HIGH (ref 70–99)

## 2023-11-29 SURGERY — EXCISION MASS LOWER EXTREMITIES
Anesthesia: General | Site: Leg Lower | Laterality: Right

## 2023-11-29 SURGERY — ARTHROSCOPY, KNEE
Anesthesia: Choice | Site: Knee | Laterality: Right

## 2023-11-29 MED ORDER — CEFAZOLIN SODIUM-DEXTROSE 2-4 GM/100ML-% IV SOLN
2.0000 g | INTRAVENOUS | Status: AC
  Administered 2023-11-29 (×2): 2 g via INTRAVENOUS

## 2023-11-29 MED ORDER — FENTANYL CITRATE (PF) 100 MCG/2ML IJ SOLN
INTRAMUSCULAR | Status: AC
  Filled 2023-11-29: qty 2

## 2023-11-29 MED ORDER — SODIUM CHLORIDE 0.9 % IV SOLN: 12.5000 mg | INTRAVENOUS | Status: DC | PRN

## 2023-11-29 MED ORDER — MIDAZOLAM HCL 2 MG/2ML IJ SOLN
INTRAMUSCULAR | Status: DC | PRN
Start: 2023-11-29 — End: 2023-11-29
  Administered 2023-11-29 (×2): 2 mg via INTRAVENOUS

## 2023-11-29 MED ORDER — ONDANSETRON HCL 4 MG/2ML IJ SOLN
4.0000 mg | Freq: Once | INTRAMUSCULAR | Status: AC
  Administered 2023-11-29 (×2): 4 mg via INTRAVENOUS
  Filled 2023-11-29: qty 2

## 2023-11-29 MED ORDER — ONDANSETRON HCL 4 MG/2ML IJ SOLN
INTRAMUSCULAR | Status: AC
  Filled 2023-11-29: qty 2

## 2023-11-29 MED ORDER — LACTATED RINGERS IV SOLN: INTRAVENOUS | Status: DC

## 2023-11-29 MED ORDER — BUPIVACAINE-EPINEPHRINE (PF) 0.5% -1:200000 IJ SOLN
INTRAMUSCULAR | Status: AC
Start: 2023-11-29 — End: 2023-11-29
  Filled 2023-11-29: qty 30

## 2023-11-29 MED ORDER — PHENYLEPHRINE 80 MCG/ML (10ML) SYRINGE FOR IV PUSH (FOR BLOOD PRESSURE SUPPORT)
PREFILLED_SYRINGE | INTRAVENOUS | Status: AC
  Filled 2023-11-29: qty 10

## 2023-11-29 MED ORDER — OXYCODONE HCL 5 MG PO TABS: 5.0000 mg | ORAL_TABLET | Freq: Once | ORAL | Status: DC | PRN

## 2023-11-29 MED ORDER — FENTANYL CITRATE (PF) 100 MCG/2ML IJ SOLN
INTRAMUSCULAR | Status: DC | PRN
  Administered 2023-11-29: 25 ug via INTRAVENOUS
  Administered 2023-11-29: 50 ug via INTRAVENOUS
  Administered 2023-11-29 (×2): 25 ug via INTRAVENOUS
  Administered 2023-11-29: 50 ug via INTRAVENOUS
  Administered 2023-11-29: 25 ug via INTRAVENOUS
  Administered 2023-11-29: 50 ug via INTRAVENOUS
  Administered 2023-11-29 (×3): 25 ug via INTRAVENOUS
  Administered 2023-11-29: 50 ug via INTRAVENOUS
  Administered 2023-11-29: 25 ug via INTRAVENOUS

## 2023-11-29 MED ORDER — DEXAMETHASONE SODIUM PHOSPHATE 10 MG/ML IJ SOLN
INTRAMUSCULAR | Status: DC | PRN
  Administered 2023-11-29 (×2): 8 mg via INTRAVENOUS

## 2023-11-29 MED ORDER — PROPOFOL 10 MG/ML IV BOLUS
INTRAVENOUS | Status: AC
  Filled 2023-11-29: qty 20

## 2023-11-29 MED ORDER — ONDANSETRON HCL 4 MG/2ML IJ SOLN
INTRAMUSCULAR | Status: DC | PRN
  Administered 2023-11-29 (×2): 4 mg via INTRAVENOUS

## 2023-11-29 MED ORDER — PHENYLEPHRINE 80 MCG/ML (10ML) SYRINGE FOR IV PUSH (FOR BLOOD PRESSURE SUPPORT)
PREFILLED_SYRINGE | INTRAVENOUS | Status: DC | PRN
  Administered 2023-11-29 (×6): 120 ug via INTRAVENOUS

## 2023-11-29 MED ORDER — ACETAMINOPHEN 500 MG PO TABS
1000.0000 mg | ORAL_TABLET | Freq: Four times a day (QID) | ORAL | 2 refills | Status: DC | PRN
  Filled 2023-11-29: qty 100, 13d supply, fill #0

## 2023-11-29 MED ORDER — OXYCODONE HCL 5 MG PO TABS: 5.0000 mg | ORAL_TABLET | ORAL | Status: DC

## 2023-11-29 MED ORDER — KETOROLAC TROMETHAMINE 30 MG/ML IJ SOLN
30.0000 mg | Freq: Once | INTRAMUSCULAR | Status: AC
  Administered 2023-11-29 (×2): 30 mg via INTRAVENOUS
  Filled 2023-11-29: qty 1

## 2023-11-29 MED ORDER — OXYCODONE HCL 5 MG/5ML PO SOLN: 5.0000 mg | Freq: Once | ORAL | Status: DC | PRN

## 2023-11-29 MED ORDER — PROPOFOL 10 MG/ML IV BOLUS
INTRAVENOUS | Status: DC | PRN
Start: 2023-11-29 — End: 2023-11-29
  Administered 2023-11-29 (×2): 100 mg via INTRAVENOUS
  Administered 2023-11-29 (×4): 50 mg via INTRAVENOUS

## 2023-11-29 MED ORDER — ORAL CARE MOUTH RINSE
15.0000 mL | Freq: Once | OROMUCOSAL | Status: AC
Start: 2023-11-29 — End: 2023-11-29

## 2023-11-29 MED ORDER — OXYCODONE HCL 5 MG PO TABS
5.0000 mg | ORAL_TABLET | ORAL | 0 refills | Status: AC | PRN
  Filled 2023-11-29: qty 30, 5d supply, fill #0

## 2023-11-29 MED ORDER — LIDOCAINE 2% (20 MG/ML) 5 ML SYRINGE
INTRAMUSCULAR | Status: AC
  Filled 2023-11-29: qty 5

## 2023-11-29 MED ORDER — ACETAMINOPHEN 500 MG PO TABS
1000.0000 mg | ORAL_TABLET | Freq: Once | ORAL | Status: AC
  Administered 2023-11-29 (×2): 1000 mg via ORAL
  Filled 2023-11-29: qty 2

## 2023-11-29 MED ORDER — CHLORHEXIDINE GLUCONATE 0.12 % MT SOLN
15.0000 mL | Freq: Once | OROMUCOSAL | Status: AC
  Administered 2023-11-29 (×2): 15 mL via OROMUCOSAL
  Filled 2023-11-29: qty 15

## 2023-11-29 MED ORDER — ACETAMINOPHEN 160 MG/5ML PO SOLN
960.0000 mg | Freq: Once | ORAL | Status: AC
  Filled 2023-11-29: qty 30

## 2023-11-29 MED ORDER — BUPIVACAINE-EPINEPHRINE (PF) 0.5% -1:200000 IJ SOLN
INTRAMUSCULAR | Status: DC | PRN
Start: 2023-11-29 — End: 2023-11-29
  Administered 2023-11-29 (×2): 10 mL via PERINEURAL

## 2023-11-29 MED ORDER — 0.9 % SODIUM CHLORIDE (POUR BTL) OPTIME
TOPICAL | Status: DC | PRN
  Administered 2023-11-29 (×2): 1000 mL

## 2023-11-29 MED ORDER — MIDAZOLAM HCL 2 MG/2ML IJ SOLN
INTRAMUSCULAR | Status: AC
  Filled 2023-11-29: qty 2

## 2023-11-29 MED ORDER — LIDOCAINE 2% (20 MG/ML) 5 ML SYRINGE
INTRAMUSCULAR | Status: DC | PRN
  Administered 2023-11-29 (×2): 100 mg via INTRAVENOUS

## 2023-11-29 MED ORDER — SUCCINYLCHOLINE CHLORIDE 200 MG/10ML IV SOSY
PREFILLED_SYRINGE | INTRAVENOUS | Status: AC
  Filled 2023-11-29: qty 10

## 2023-11-29 MED ORDER — FENTANYL CITRATE PF 50 MCG/ML IJ SOSY
25.0000 ug | PREFILLED_SYRINGE | INTRAMUSCULAR | Status: DC | PRN
  Administered 2023-11-29 (×2): 50 ug via INTRAVENOUS
  Filled 2023-11-29: qty 1

## 2023-11-29 MED ORDER — CEFAZOLIN SODIUM-DEXTROSE 2-4 GM/100ML-% IV SOLN
INTRAVENOUS | Status: AC
Start: 2023-11-29 — End: 2023-11-29
  Filled 2023-11-29: qty 100

## 2023-11-29 MED ORDER — DEXAMETHASONE SODIUM PHOSPHATE 10 MG/ML IJ SOLN
INTRAMUSCULAR | Status: AC
  Filled 2023-11-29: qty 1

## 2023-11-29 SURGICAL SUPPLY — 39 items
BANDAGE ESMARK 4X12 BL STRL LF (DISPOSABLE) ×1 IMPLANT
BENZOIN TINCTURE PRP APPL 2/3 (GAUZE/BANDAGES/DRESSINGS) ×1 IMPLANT
BLADE SURG 15 STRL LF DISP TIS (BLADE) IMPLANT
BLADE SURG SZ10 CARB STEEL (BLADE) IMPLANT
BNDG COHESIVE 4X5 TAN STRL (GAUZE/BANDAGES/DRESSINGS) ×1 IMPLANT
BNDG ELASTIC 4X5.8 VLCR NS LF (GAUZE/BANDAGES/DRESSINGS) ×1 IMPLANT
BNDG GAUZE ELAST 4 BULKY (GAUZE/BANDAGES/DRESSINGS) ×1 IMPLANT
BUR OVAL CARBIDE 4.0 (BURR) IMPLANT
CHLORAPREP W/TINT 26 (MISCELLANEOUS) ×1 IMPLANT
CLOTH BEACON ORANGE TIMEOUT ST (SAFETY) ×1 IMPLANT
COVER LIGHT HANDLE STERIS (MISCELLANEOUS) ×2 IMPLANT
CUFF TRNQT CYL 34X4.125X (TOURNIQUET CUFF) ×1 IMPLANT
ELECTRODE REM PT RTRN 9FT ADLT (ELECTROSURGICAL) ×1 IMPLANT
GAUZE 4X4 16PLY ~~LOC~~+RFID DBL (SPONGE) IMPLANT
GAUZE SPONGE 4X4 12PLY STRL (GAUZE/BANDAGES/DRESSINGS) ×1 IMPLANT
GAUZE XEROFORM 5X9 LF (GAUZE/BANDAGES/DRESSINGS) ×1 IMPLANT
GLOVE BIOGEL PI IND STRL 7.0 (GLOVE) ×2 IMPLANT
GLOVE BIOGEL PI IND STRL 8.5 (GLOVE) ×1 IMPLANT
GLOVE SKINSENSE STRL SZ8.0 LF (GLOVE) ×1 IMPLANT
GOWN STRL REUS W/TWL LRG LVL3 (GOWN DISPOSABLE) ×1 IMPLANT
GOWN STRL REUS W/TWL XL LVL3 (GOWN DISPOSABLE) ×1 IMPLANT
KIT TURNOVER KIT A (KITS) ×1 IMPLANT
MANIFOLD NEPTUNE II (INSTRUMENTS) ×1 IMPLANT
NDL HYPO 21X1.5 SAFETY (NEEDLE) ×1 IMPLANT
NEEDLE HYPO 21X1.5 SAFETY (NEEDLE) ×1 IMPLANT
NS IRRIG 1000ML POUR BTL (IV SOLUTION) ×1 IMPLANT
PACK BASIC LIMB (CUSTOM PROCEDURE TRAY) ×1 IMPLANT
PAD ABD 5X9 TENDERSORB (GAUZE/BANDAGES/DRESSINGS) IMPLANT
PAD ARMBOARD POSITIONER FOAM (MISCELLANEOUS) ×1 IMPLANT
POSITIONER HEAD 8X9X4 ADT (SOFTGOODS) ×1 IMPLANT
SET BASIN LINEN APH (SET/KITS/TRAYS/PACK) ×1 IMPLANT
SPONGE T-LAP 18X18 ~~LOC~~+RFID (SPONGE) ×1 IMPLANT
STRIP CLOSURE SKIN 1/2X4 (GAUZE/BANDAGES/DRESSINGS) ×1 IMPLANT
SUT BONE WAX W31G (SUTURE) IMPLANT
SUT MON AB 0 CT1 (SUTURE) IMPLANT
SUT MON AB 2-0 SH27 (SUTURE) IMPLANT
SUT VIC AB 1 CT1 27XBRD ANTBC (SUTURE) IMPLANT
SYR 30ML LL (SYRINGE) ×1 IMPLANT
SYR BULB IRRIG 60ML STRL (SYRINGE) ×1 IMPLANT

## 2023-11-29 NOTE — Transfer of Care (Signed)
 Immediate Anesthesia Transfer of Care Note  Patient: Aaron Kennedy  Procedure(s) Performed: EXCISION MASS LOWER EXTREMITIES / excision of bone spur  right tibia (Right: Leg Lower)  Patient Location: PACU  Anesthesia Type:General  Level of Consciousness: awake and patient cooperative  Airway & Oxygen Therapy: Patient Spontanous Breathing and Patient connected to face mask oxygen  Post-op Assessment: Report given to RN and Post -op Vital signs reviewed and stable  Post vital signs: Reviewed and stable  Last Vitals:  Vitals Value Taken Time  BP 171/117 11/29/23 12:13  Temp 97.7 11/29/23  1216  Pulse 93 11/29/23 12:15  Resp 13 11/29/23 12:15  SpO2 99 % 11/29/23 12:15  Vitals shown include unfiled device data.  Last Pain:  Vitals:   11/29/23 0925  TempSrc: Oral  PainSc: 7       Patients Stated Pain Goal: 8 (11/29/23 0925)  Complications: No notable events documented.

## 2023-11-29 NOTE — Anesthesia Postprocedure Evaluation (Signed)
 Anesthesia Post Note  Patient: Aaron Kennedy  Procedure(s) Performed: EXCISION MASS LOWER EXTREMITIES / excision of bone spur  right tibia (Right: Leg Lower)  Patient location during evaluation: PACU Anesthesia Type: General Level of consciousness: awake and alert Pain management: pain level controlled Vital Signs Assessment: post-procedure vital signs reviewed and stable Respiratory status: spontaneous breathing, nonlabored ventilation, respiratory function stable and patient connected to nasal cannula oxygen Cardiovascular status: blood pressure returned to baseline and stable Postop Assessment: no apparent nausea or vomiting Anesthetic complications: no   There were no known notable events for this encounter.   Last Vitals:  Vitals:   11/29/23 1300 11/29/23 1310  BP: (!) 145/89 (!) 160/101  Pulse: 81 76  Resp: 16 15  Temp:  36.6 C  SpO2: 95% 98%    Last Pain:  Vitals:   11/29/23 1310  TempSrc: Oral  PainSc: 6                  Hooper Petteway L Deazia Lampi

## 2023-11-29 NOTE — Brief Op Note (Signed)
 11/29/2023  12:07 PM  PATIENT:  Aaron Kennedy  53 y.o. male  PRE-OPERATIVE DIAGNOSIS:  excison of bone spur right tibia  POST-OPERATIVE DIAGNOSIS:  excison of bone spur right tibia  PROCEDURE:  Procedure(s): EXCISION MASS LOWER EXTREMITIES / excision of bone spur  right tibia (Right)  Findings sessile appearing bone lesion rule out osteochondroma medial proximal tibia  SURGEON:  Surgeons and Role:    * Margrette Taft BRAVO, MD - Primary  PHYSICIAN ASSISTANT:   ASSISTANTS: none   ANESTHESIA:   general  EBL:  10 mL   BLOOD ADMINISTERED:none  DRAINS: none   LOCAL MEDICATIONS USED:  MARCAINE      SPECIMEN:  Source of Specimen:  Medial proximal tibial metaphysis  DISPOSITION OF SPECIMEN:  PATHOLOGY  COUNTS:  YES  TOURNIQUET:   Total Tourniquet Time Documented: Thigh (Right) - 35 minutes Total: Thigh (Right) - 35 minutes   DICTATION: .Nechama Dictation  PLAN OF CARE: Discharge to home after PACU  PATIENT DISPOSITION:  PACU - hemodynamically stable.   Delay start of Pharmacological VTE agent (>24hrs) due to surgical blood loss or risk of bleeding: not applicable

## 2023-11-29 NOTE — Anesthesia Procedure Notes (Addendum)
 Procedure Name: LMA Insertion Date/Time: 11/29/2023 11:07 AM  Performed by: Augusta Daved SAILOR, CRNAPre-anesthesia Checklist: Patient identified, Emergency Drugs available, Suction available and Patient being monitored Patient Re-evaluated:Patient Re-evaluated prior to induction Oxygen Delivery Method: Circle System Utilized Preoxygenation: Pre-oxygenation with 100% oxygen Induction Type: IV induction Ventilation: Mask ventilation without difficulty and Oral airway inserted - appropriate to patient size LMA: LMA inserted LMA Size: 5.0 Number of attempts: 1 Placement Confirmation: positive ETCO2 Tube secured with: Tape Dental Injury: Teeth and Oropharynx as per pre-operative assessment

## 2023-11-29 NOTE — Anesthesia Preprocedure Evaluation (Signed)
 Anesthesia Evaluation  Patient identified by MRN, date of birth, ID band Patient awake    Reviewed: Allergy & Precautions, NPO status , Patient's Chart, lab work & pertinent test results, reviewed documented beta blocker date and time   Airway Mallampati: II  TM Distance: >3 FB     Dental no notable dental hx. (+) Dental Advisory Given, Teeth Intact   Pulmonary Current SmokerPatient did not abstain from smoking.   Pulmonary exam normal breath sounds clear to auscultation       Cardiovascular hypertension, Pt. on medications and Pt. 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   Abdominal   Peds  Hematology negative hematology ROS (+) Plavix  therapy- last dose more than 7 days ago   Anesthesia Other Findings PVD  Reproductive/Obstetrics                              Anesthesia Physical Anesthesia Plan  ASA: 3  Anesthesia Plan: General   Post-op Pain Management: Dilaudid  IV   Induction:   PONV Risk Score and Plan: Midazolam , Ondansetron  and Dexamethasone   Airway Management Planned: LMA  Additional Equipment: None  Intra-op Plan:   Post-operative Plan: Extubation in OR  Informed Consent: I have reviewed the patients History and Physical, chart, labs and discussed the procedure including the  risks, benefits and alternatives for the proposed anesthesia with the patient or authorized representative who has indicated his/her understanding and acceptance.     Dental advisory given  Plan Discussed with: CRNA  Anesthesia Plan Comments: (PAT note written 05/06/2023 by Allison Zelenak, PA-C.  )        Anesthesia Quick Evaluation

## 2023-11-29 NOTE — Op Note (Signed)
 11/29/2023  12:07 PM  PATIENT:  Aaron Kennedy  53 y.o. male  PRE-OPERATIVE DIAGNOSIS:  excison of bone spur right tibia  POST-OPERATIVE DIAGNOSIS:  excison of bone spur right tibia  PROCEDURE:  Procedure(s): EXCISION MASS LOWER EXTREMITIES / excision of bone spur  right tibia (Right)  Findings sessile appearing bone lesion rule out osteochondroma medial proximal tibia  SURGEON:  Surgeons and Role:    DEWAINE Margrette Taft FORBES, MD - Primary  The surgery was done as follows.  The patient was brought back to the operating room after preop evaluation he was placed supine on the operating table he underwent general anesthesia with intubation.  He was in the supine position we placed a tourniquet on his proximal thigh.  His right lower extremity was prepped and draped sterilely and timeout was completed  I made an incision over the mass proximal medial tibia in Langer's lines I divided subcutaneous tissue and encountered the pes tendons.  I made the initial split proximal and divided the periosteum and expose the lesion.  It was notable that the tendons did slide over the mass.  However to better access the mass I split the pes tendons more distally centered over the mass and then divided the periosteum again and then used an osteotome to remove the bone and sent it for pathology to rule out osteochondroma  I irrigated the wound I used a bur to smooth out the remaining bone bed and then placed bone wax over the area.  I irrigated again and then closed both splits in the peroneal tendon and anterior to it with #1 Vicryl.  I used 0 Monocryl to close the subcu and then a running subcuticular 2-0 Monocryl with Steri-Strips.  I then injected Marcaine  10 cc.  Sterile dressing applied.  PHYSICIAN ASSISTANT:   ASSISTANTS: none   ANESTHESIA:   general  EBL:  10 mL   BLOOD ADMINISTERED:none  DRAINS: none   LOCAL MEDICATIONS USED:  MARCAINE      SPECIMEN:  Source of Specimen:  Medial  proximal tibial metaphysis  DISPOSITION OF SPECIMEN:  PATHOLOGY  COUNTS:  YES  TOURNIQUET:   Total Tourniquet Time Documented: Thigh (Right) - 35 minutes Total: Thigh (Right) - 35 minutes   DICTATION: .Nechama Dictation  PLAN OF CARE: Discharge to home after PACU  PATIENT DISPOSITION:  PACU - hemodynamically stable.   Delay start of Pharmacological VTE agent (>24hrs) due to surgical blood loss or risk of bleeding: not applicable  Postoperative plan  Weight-bear as tolerated  Patient will come in next week for dressing change  Allow shower and for 72 hours with covering of the wound

## 2023-11-29 NOTE — Interval H&P Note (Signed)
 History and Physical Interval Note:  11/29/2023 10:45 AM  [facility]  has presented today for surgery, with the diagnosis of excison of bone spur right tibia.  The various methods of treatment have been discussed with the patient and family. After consideration of risks, benefits and other options for treatment, the patient has consented to  Procedure(s): EXCISION MASS LOWER EXTREMITIES / excision of bone spur  right tibia (Right) as a surgical intervention.  The patient's history has been reviewed, patient examined, no change in status, stable for surgery.  I have reviewed the patient's chart and labs.  Questions were answered to the patient's satisfaction.     [name]

## 2023-11-30 ENCOUNTER — Encounter (HOSPITAL_COMMUNITY): Payer: Self-pay | Admitting: Orthopedic Surgery

## 2023-11-30 LAB — GLUCOSE, CAPILLARY: Glucose-Capillary: 105 mg/dL — ABNORMAL HIGH (ref 70–99)

## 2023-12-01 ENCOUNTER — Other Ambulatory Visit: Payer: Self-pay

## 2023-12-01 DIAGNOSIS — E118 Type 2 diabetes mellitus with unspecified complications: Secondary | ICD-10-CM

## 2023-12-01 LAB — SURGICAL PATHOLOGY

## 2023-12-01 NOTE — Progress Notes (Signed)
 12/01/2023 Name: Aaron Kennedy MRN: [id number] DOB: [birth year]  Chief Complaint  Patient presents with   Diabetes   Hyperlipidemia   Hypertension    Aaron Kennedy is a 53 y.o. year old male who presented for a telephone visit.   They were referred to the pharmacist by their PCP for assistance in managing diabetes and hyperlipidemia. PMH includes T2DM, PAD, Stroke (2016), tobacco use, HFmrEF.  Subjective: Patient was last engaged by pharmacy vis telephone on 09/04/23. He was happy with the improvement in his BG since increasing Ozempic  to 1 mg weekly. No medication changes were made. He saw his PCP, [name], NP on 10/07/23. His A1C had improved from 7.9% to 6.5%. His LDL-C increased from 146 mg/dL to 836 mg/dL. He was engaged by pharmacy via telephone on 10/31/23. He reported doing well. He continued to have difficulty adhering to atorvastatin . He was instructed to start Repatha  140 mg every 14 days.  Today, patient reports doing well. He had a procedure on Tuesday and is still having some pain. He has ankle surgery scheduled for 01/08/24. He reports that he was able to start taking Repatha  and is not having any issues with the injection.  Care Team: Primary Care Provider: [name], NP ; Next Scheduled Visit: 12/22/5  Medication Access/Adherence  Current Pharmacy:  [facility] [address] [place] [address] Phone: [phone] Fax: [phone]   Patient reports barriers to adherence - he forgets to take PM medications (atorvastatin ), and struggles to take all his medications in the AM because he does not always eat breakfast.   Patient denies transportation barriers to taking his medications.  Patient denies cost barriers to taking his medications  Diabetes:  Current medications: metformin  XR 500 mg PO BID, semaglutide  (Ozempic ) 1 mg subcutaneous weekly (Thursdays)   Current glucose readings:  did not review today  Patient denies hypoglycemic s/sx including dizziness, shakiness, sweating. Patient denies hyperglycemic symptoms including polyuria, polydipsia, polyphagia, nocturia, neuropathy. He has stable blurry vision.  Current meal patterns (2 meals/day): trying to avoid fried foods - eating more broiled and grilled foods. - Breakfast: boiled eggs, sausage, egg, and biscuit - often picks something up from [employer]. - Supper: grilled/broiled shrimp or fish, home fries, broccoli, pork chops + green beans + red potatoes  - Snacks: cucumber with vinegar, watermelon  - Drinks: coffee with creamer, has tried to cut back on regular Pepsi - but still drinking some. Trying to replace at least 2 of his daily sodas with zero-sugar gatorade. He does not drink much water  throughout the day.   Hypertension:  Current medications: metoprolol  succinate 25 mg daily, lisinopril /hydrochlorothiazide  10/12.5 mg daily  Hyperlipidemia/ASCVD Risk Reduction  Current lipid lowering medications: atorvastatin  80 mg daily, Repatha  140 mg [place] every 14 days - Patient reported some GI upset with atorvastatin  at first but was advised by doctor to take with food, which helped.   Denies issues with tolerability of Repatha . Small bump at injection site that goes away after a couple days. Had not had issues remember every other week dosing.  Antiplatelet regimen: clopidogrel  75 mg daily (has previously reported taking every other day due to thin skin, bleeding, bruising)  ASCVD History: PAD, CVA  Family History: stroke (sister), heart attack (brother) Risk Factors: HLD, HTN, DM, premature ASCVD  The ASCVD Risk score (Arnett DK, et al., 2019) failed to calculate for the following reasons:   Risk score cannot be calculated  because patient has a medical history suggesting prior/existing ASCVD    Objective:  BP Readings from Last 3 Encounters:  11/29/23 (!) 160/101  11/25/23 (!) 134/97  11/02/23 124/84     Lab Results  Component Value Date   HGBA1C 6.6 (H) 11/25/2023   HGBA1C 6.5 (A) 10/07/2023   HGBA1C 7.9 (A) 06/23/2023    Lab Results  Component Value Date   CREATININE 0.80 11/25/2023   BUN 12 11/25/2023   NA 136 11/25/2023   K 3.8 11/25/2023   CL 105 11/25/2023   CO2 21 (L) 11/25/2023    Lab Results  Component Value Date   CHOL 213 (H) 10/07/2023   HDL 27 (L) 10/07/2023   LDLCALC 163 (H) 10/07/2023   TRIG 124 10/07/2023   CHOLHDL 7.9 (H) 10/07/2023    BP Readings from Last 3 Encounters:  11/29/23 (!) 160/101  11/25/23 (!) 134/97  11/02/23 124/84    Medications Reviewed Today     Reviewed by Brinda Lorain SQUIBB, RPH (Pharmacist) on 12/01/23 at 1148  Med List Status: <None>   Medication Order Taking? Sig Documenting Provider Last Dose Status Informant  Accu-Chek Softclix Lancets lancets [id number]  Check up to twice daily. Oley Bascom RAMAN, NP  Active Self  acetaminophen  (TYLENOL ) 500 MG tablet [id number]  Take 2 tablets (1,000 mg total) by mouth every 6 (six) hours as needed. Margrette Taft BRAVO, MD  Active   atorvastatin  (LIPITOR) 80 MG tablet [id number]  Take 1 tablet (80 mg total) by mouth daily.  Patient taking differently: Take 80 mg by mouth every evening.   Oley Bascom RAMAN, NP  Active Self           Med Note CLAUD, YOLINDA T   Tue Nov 22, 2023 10:20 AM)    Blood Glucose Monitoring Suppl (BLOOD GLUCOSE MONITOR SYSTEM) w/Device KIT [id number]  Use as directed up to twice daily. Oley Bascom RAMAN, NP  Active Self  clopidogrel  (PLAVIX ) 75 MG tablet [id number]  Take 1 tablet (75 mg total) by mouth daily.  Patient taking differently: Take 75 mg by mouth every other day.   Oley Bascom RAMAN, NP  Active Self           Med Note DALENE, LUKE GAILS   Fri Nov 25, 2023  8:31 AM) Last dose of plavix  was 8/6-per patient  Evolocumab  (REPATHA  SURECLICK) 140 MG/ML SOAJ [id number] Yes Inject 140 mg into the skin every 14 (fourteen) days. Oley Bascom RAMAN, NP  Active Self  gabapentin   (NEURONTIN ) 600 MG tablet [id number]  Take 1 tablet (600 mg total) by mouth 2 (two) times daily.  Patient taking differently: Take 600 mg by mouth See admin instructions. Take 1 tablet (600 mg) by mouth scheduled in the morning, may take an additional dose (600 mg) in the evening if needed for pain.   Oley Bascom RAMAN, NP  Active Self  Glucose Blood (BLOOD GLUCOSE TEST STRIPS) STRP [id number]  Check up to twice daily. Oley Bascom RAMAN, NP  Active Self  Lancet Device MISC [id number]  Check up to twice daily. May substitute to any manufacturer covered by patient's insurance. Oley Bascom RAMAN, NP  Active Self  lisinopril -hydrochlorothiazide  (ZESTORETIC ) 10-12.5 MG tablet [id number]  TAKE 1 TABLET BY MOUTH DAILY. Oley Bascom RAMAN, NP  Active Self  metFORMIN  (GLUCOPHAGE -XR) 500 MG 24 hr tablet [id number]  Take 2 tablets (1,000 mg total) by mouth daily with breakfast.  Patient taking differently: Take 500 mg by mouth in the morning and at bedtime.  Oley Bascom RAMAN, NP  Active Self  metoprolol  succinate (TOPROL -XL) 25 MG 24 hr tablet [id number]  Take 1 tablet (25 mg total) by mouth daily. Oley Bascom RAMAN, NP  Active Self  oxyCODONE  (OXY IR/ROXICODONE ) 5 MG immediate release tablet [id number]  Take 1 tablet (5 mg total) by mouth every 4 (four) hours as needed for up to 5 days for severe pain (pain score 7-10). Margrette Taft BRAVO, MD  Active   Semaglutide , 1 MG/DOSE, 4 MG/3ML SOPN [id number]  Inject 1 mg as directed once a week.  Patient taking differently: Inject 1 mg as directed every Thursday.   Oley Bascom RAMAN, NP  Active Self           Med Note DALENE, LUKE GAILS   Fri Nov 25, 2023  8:32 AM) Last dose was 11/21/2023  tetrahydrozoline 0.05 % ophthalmic solution [id number]  Place 1-2 drops into both eyes daily as needed (eye irritation.). [provider]  Active Self  Vitamin D , Ergocalciferol , (DRISDOL ) 1.25 MG (50000 UNIT) CAPS capsule [id number]  Take 1 capsule (50,000 Units total) by mouth every 7  (seven) days.  Patient taking differently: Take 50,000 Units by mouth every Thursday.   Oley Bascom RAMAN, NP  Active Self            Assessment/Plan:   Diabetes: - Currently controlled based on last A1c 6.6%, below goal < 7%. Congratulated patient on improvement! Patient is a greatcandidate for continued tx with GLP-1RA given ASCVD and BMI 31. 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  - Reviewed long term cardiovascular and renal outcomes of uncontrolled blood sugar - Reviewed goal A1c, goal fasting, and goal 2 hour post prandial glucose - Reviewed dietary modifications including: reducing sugary beverage intake (congratulated on cutting back on Pepsi), focusing on meals with more protein and vegetables than carbohydrates, and combing protein with carbs to spike blood glucose less.  - Recommend to continue metformin  XR 1000 mg daily in the morning. - Recommend to continue Ozempic  1 mg weekly.  - Recommend to check glucose in the morning while fasting and two-hours after a meal a couple times per week - Next A1C due Sept 2025 - Obtain UACR in at next PCP appointment - if elevated, consider initiation of SGLT2i, possibly in combination with metformin , regardless of A1C.  Hypertension: - Currently controlled based on most recent BP readings from office visits. Appropriate to continue current regimen - Reviewed long term cardiovascular and renal outcomes of uncontrolled blood pressure - Reviewed appropriate blood pressure monitoring technique and reviewed goal blood pressure. Recommended to check home blood pressure and heart rate periodically - Recommend to continue metoprolol  succinate 25 mg daily and lisinopril /hydrochlorothiazide  10/12.5 mg daily  Hyperlipidemia/ASCVD Risk Reduction: - Currently uncontrolled based on most recent LDL of 163 mg/dL in March 2025, increased  from LDL-C of 143 mg/dL on last lipid panel. LDL goal <55.Patient is tolerating PCSK9i, Repatha . Appropriate to continue current regimen. - Reviewed long term complications of uncontrolled cholesterol - Recommend to continue atorvastatin  80 mg daily with food  - Recommend to continue Repatha  140 mg injected into the skin once every 14 days.     Follow Up Plan: Pharmacist in person 02/07/24, PCP 04/09/24  Lorain Baseman, PharmD [facility] [phone]

## 2023-12-06 ENCOUNTER — Other Ambulatory Visit: Payer: Self-pay | Admitting: Orthopedic Surgery

## 2023-12-06 ENCOUNTER — Other Ambulatory Visit: Payer: Self-pay

## 2023-12-06 ENCOUNTER — Telehealth: Payer: Self-pay | Admitting: Orthopedic Surgery

## 2023-12-06 NOTE — Telephone Encounter (Signed)
 Dr. Areatha pt - spoke w/the pt, he is requesting a refill for Oxycodone  5mg , 30 tablets (stated he probably can get by w/15, he really only needs it in the mornings), every 4 hour PRN for sever pain to be sent to [facility].

## 2023-12-07 ENCOUNTER — Encounter: Payer: Self-pay | Admitting: Orthopedic Surgery

## 2023-12-07 ENCOUNTER — Other Ambulatory Visit: Payer: Self-pay

## 2023-12-07 ENCOUNTER — Ambulatory Visit (INDEPENDENT_AMBULATORY_CARE_PROVIDER_SITE_OTHER): Admitting: Orthopedic Surgery

## 2023-12-07 DIAGNOSIS — D1621 Benign neoplasm of long bones of right lower limb: Secondary | ICD-10-CM

## 2023-12-07 DIAGNOSIS — G8918 Other acute postprocedural pain: Secondary | ICD-10-CM

## 2023-12-07 MED ORDER — OXYCODONE HCL 5 MG PO TABS
5.0000 mg | ORAL_TABLET | ORAL | 0 refills | Status: AC | PRN
  Filled 2023-12-07: qty 30, 5d supply, fill #0

## 2023-12-07 NOTE — Progress Notes (Signed)
   There were no vitals taken for this visit.  There is no height or weight on file to calculate BMI.  Chief Complaint  Patient presents with   Knee Pain    Right/ excision bone spur     No diagnosis found.  DOI/DOS/ Date: 11/29/23  Unchanged

## 2023-12-07 NOTE — Progress Notes (Signed)
 Postop visit #1 status post osteochondroma excision right knee  Wound check  Wound looks clean Steri-Strips were exchanged pain medication was extended because of ongoing pain postoperatively  However, this will be the last oxycodone  prescription patient aware  Return in a week to clip the ends of his absorbable sutures

## 2023-12-09 ENCOUNTER — Encounter: Payer: Self-pay | Admitting: Radiology

## 2023-12-14 DIAGNOSIS — D1621 Benign neoplasm of long bones of right lower limb: Secondary | ICD-10-CM | POA: Insufficient documentation

## 2023-12-15 ENCOUNTER — Ambulatory Visit (INDEPENDENT_AMBULATORY_CARE_PROVIDER_SITE_OTHER): Admitting: Orthopedic Surgery

## 2023-12-15 DIAGNOSIS — D1621 Benign neoplasm of long bones of right lower limb: Secondary | ICD-10-CM

## 2023-12-15 NOTE — Progress Notes (Signed)
   There were no vitals taken for this visit.  There is no height or weight on file to calculate BMI.  Chief Complaint  Patient presents with   Routine Post Op    Encounter Diagnosis  Name Primary?   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

## 2023-12-15 NOTE — Progress Notes (Signed)
 Postop visit   Chief Complaint  Patient presents with   Routine Post Op    Encounter Diagnosis  Name Primary?   Osteochondroma of right tibia s/p excision 11/29/23 Yes    DOI/DOS/ Date: 11/29/23  Improved  Postop day 15 wound looks clean patient's pain is decreasing  Sutures clipped at the end  Return as needed progressively increase activity as tolerated

## 2023-12-23 ENCOUNTER — Telehealth: Payer: Self-pay | Admitting: Podiatry

## 2023-12-23 NOTE — Telephone Encounter (Signed)
 DOS- 01/06/2024  ARTHROSCOPY OF ANKLE RT- [id number] SECONDARY REPAIR OF A DISRUPTED ANKLE COLLATERAL LIGAMENT RT- [id number] SURGICAL REPAIR, REVISION OR RECONSTRUCTION OF A FLEXOR TENDON IN LEG RT- [id number]  [employer] EFFECTIVE DATE- 04/19/2022  PER [facility] WEBSITE, NO PRIOR AUTHS ARE REQUIRED FOR CPT CODES 70108, [phone] (2), AND 72340. DOCUMENTATION ATTACHED TO SURGICAL CONSENT PACKET. SURGERY TYPES NOT FILED THROUGH [facility].

## 2023-12-29 ENCOUNTER — Telehealth: Payer: Self-pay | Admitting: Podiatry

## 2023-12-29 ENCOUNTER — Ambulatory Visit: Payer: Self-pay | Admitting: Nurse Practitioner

## 2023-12-29 NOTE — Telephone Encounter (Signed)
 Called pt because he is scheduled for surgery on 9/19 and we have not received the H & P form from pcp. I faxed the request on 9/5.  Pt is calling them to check on it. I did explain he would have to be seen within 30 days of the procedure date for the h&p to be valid.

## 2023-12-29 NOTE — Telephone Encounter (Signed)
 Copied from CRM #[id number]. Topic: Clinical - Medical Advice >> Dec 29, 2023  3:19 PM Terri G wrote: Reason for CRM: Patient stated that [facility] stated he needs an HMP from provider before his surgery on Sept19. And he also wanted to let Dr.Nichols know that he's appreciative of everything she has done for him. Callback number [phone]

## 2023-12-29 NOTE — Telephone Encounter (Signed)
FYI Raymond

## 2023-12-30 ENCOUNTER — Other Ambulatory Visit: Payer: Self-pay

## 2023-12-30 ENCOUNTER — Telehealth: Payer: Self-pay | Admitting: Nurse Practitioner

## 2023-12-30 ENCOUNTER — Encounter
Admission: RE | Admit: 2023-12-30 | Discharge: 2023-12-30 | Disposition: A | Discharge status: Home / Self Care | Source: Ambulatory Visit | Attending: Podiatry | Admitting: Podiatry

## 2023-12-30 ENCOUNTER — Other Ambulatory Visit: Payer: Self-pay | Admitting: Nurse Practitioner

## 2023-12-30 DIAGNOSIS — I10 Essential (primary) hypertension: Secondary | ICD-10-CM

## 2023-12-30 DIAGNOSIS — G629 Polyneuropathy, unspecified: Secondary | ICD-10-CM

## 2023-12-30 DIAGNOSIS — E118 Type 2 diabetes mellitus with unspecified complications: Secondary | ICD-10-CM

## 2023-12-30 DIAGNOSIS — G8929 Other chronic pain: Secondary | ICD-10-CM

## 2023-12-30 HISTORY — DX: Posterior tibial tendinitis, right leg: M76.821

## 2023-12-30 HISTORY — DX: Peroneal tendinitis, right leg: M76.71

## 2023-12-30 HISTORY — DX: Other instability, right ankle: M25.371

## 2023-12-30 HISTORY — DX: Unspecified osteoarthritis, unspecified site: M19.90

## 2023-12-30 HISTORY — DX: Other specified acquired deformities of musculoskeletal system: M95.8

## 2023-12-30 MED ORDER — GABAPENTIN 600 MG PO TABS
600.0000 mg | ORAL_TABLET | Freq: Two times a day (BID) | ORAL | 2 refills | Status: AC
  Filled 2023-12-30: qty 60, 30d supply, fill #0
  Filled 2024-03-09: qty 60, 30d supply, fill #1

## 2023-12-30 NOTE — Telephone Encounter (Unsigned)
 Copied from CRM [id number]. Topic: Clinical - Request for Lab/Test Order >> Dec 30, 2023  9:24 AM Carlatta H wrote: Reason for CRM: Patient needs to have labs done for surgery//Please call patient

## 2023-12-30 NOTE — Telephone Encounter (Signed)
 I do not see the paper work you are talking about. I told him I will call him back with the information.

## 2023-12-30 NOTE — Patient Instructions (Addendum)
 Your procedure is scheduled on: 01/06/24 - Friday Report to the Registration Desk on the 1st floor of the [facility]. To find out your arrival time, please call [phone] between 1PM - 3PM on: 01/05/24 - Thursday If your arrival time is 6:00 am, do not arrive before that time as the [facility] entrance doors do not open until 6:00 am.  Report to [facility] on 01/02/24 for EKG at 0800 - [address] [place] - [address] Pre Admission testing.   REMEMBER: Instructions that are not followed completely may result in serious medical risk, up to and including death; or upon the discretion of your surgeon and anesthesiologist your surgery may need to be rescheduled.  Do not eat food after midnight the night before surgery.  No gum chewing or hard candies.  You may however, drink CLEAR liquids up to 2 hours before you are scheduled to arrive for your surgery. Do not drink anything within 2 hours of your scheduled arrival time.  Clear liquids include: - water    In addition, your doctor has ordered for you to drink the provided:  Gatorade G2 Drinking this carbohydrate drink up to two hours before surgery helps to reduce insulin  resistance and improve patient outcomes. Please complete drinking 2 hours before scheduled arrival time.  One week prior to surgery: Stop Anti-inflammatories (NSAIDS) such as Advil , Aleve, Ibuprofen , Motrin , Naproxen, Naprosyn and Aspirin  based products such as Excedrin, Goody's Powder, BC Powder. You may continue to take Tylenol  if needed for pain up until the day of surgery.  Stop ANY OVER THE COUNTER supplements until after surgery.  metFORMIN  (GLUCOPHAGE -XR) hold beginning 09/17, may resume after surgery.  Semaglutide  hold 7 days prior to your surgery. ( Last dose 09/08 )  clopidogrel  (PLAVIX ) hold 5 days prior to surgery, ( last dose taken 09/12)    ON THE DAY OF SURGERY ONLY TAKE THESE MEDICATIONS WITH SIPS OF  WATER :  gabapentin  (NEURONTIN )  metoprolol  succinate (TOPROL -XL)    No Alcohol for 24 hours before or after surgery.  No Smoking including e-cigarettes for 24 hours before surgery.  No chewable tobacco products for at least 6 hours before surgery.  No nicotine  patches on the day of surgery.  Do not use any recreational drugs for at least a week (preferably 2 weeks) before your surgery.  Please be advised that the combination of cocaine and anesthesia may have negative outcomes, up to and including death. If you test positive for cocaine, your surgery will be cancelled.  On the morning of surgery brush your teeth with toothpaste and water , you may rinse your mouth with mouthwash if you wish. Do not swallow any toothpaste or mouthwash.  Use CHG Soap or wipes as directed on instruction sheet.  Do not wear jewelry, make-up, hairpins, clips or nail polish.  For welded (permanent) jewelry: bracelets, anklets, waist bands, etc.  Please have this removed prior to surgery.  If it is not removed, there is a chance that hospital personnel will need to cut it off on the day of surgery.  Do not wear lotions, powders, or perfumes.   Do not shave body hair from the neck down 48 hours before surgery.  Contact lenses, hearing aids and dentures may not be worn into surgery.  Do not bring valuables to the hospital. [facility] is not responsible for any missing/lost belongings or valuables.   Notify your doctor if there is any change in your medical condition (cold, fever,  infection).  Wear comfortable clothing (specific to your surgery type) to the hospital.  After surgery, you can help prevent lung complications by doing breathing exercises.  Take deep breaths and cough every 1-2 hours. Your doctor may order a device called an Incentive Spirometer to help you take deep breaths.  When coughing or sneezing, hold a pillow firmly against your incision with both hands. This is called "splinting."  Doing this helps protect your incision. It also decreases belly discomfort.  If you are being admitted to the hospital overnight, leave your suitcase in the car. After surgery it may be brought to your room.  In case of increased patient census, it may be necessary for you, the patient, to continue your postoperative care in the Same Day Surgery department.  If you are being discharged the day of surgery, you will not be allowed to drive home. You will need a responsible individual to drive you home and stay with you for 24 hours after surgery.   If you are taking public transportation, you will need to have a responsible individual with you.  Please call the Pre-admissions Testing Dept. at [phone] if you have any questions about these instructions.  Surgery Visitation Policy:  Patients having surgery or a procedure may have two visitors.  Children under the age of 48 must have an adult with them who is not the patient.  Inpatient Visitation:    Visiting hours are 7 a.m. to 8 p.m. Up to four visitors are allowed at one time in a patient room. The visitors may rotate out with other people during the day.  One visitor age 73 or older may stay with the patient overnight and must be in the room by 8 p.m.   Merchandiser, retail to address health-related social needs:  [email or web address]                                                                                                             Preparing for Surgery with CHLORHEXIDINE  GLUCONATE (CHG) Soap  Chlorhexidine  Gluconate (CHG) Soap  o An antiseptic cleaner that kills germs and bonds with the skin to continue killing germs even after washing  o Used for showering the night before surgery and morning of surgery  Before surgery, you can play an important role by reducing the number of germs on your skin.  CHG (Chlorhexidine  gluconate) soap is an antiseptic cleanser which kills germs and bonds with  the skin to continue killing germs even after washing.  Please do not use if you have an allergy to CHG or antibacterial soaps. If your skin becomes reddened/irritated stop using the CHG.  1. Shower the NIGHT BEFORE SURGERY and the MORNING OF SURGERY with CHG soap.  2. If you choose to wash your hair, wash your hair first as usual with your normal shampoo.  3. After shampooing, rinse your hair and body thoroughly to remove the shampoo.  4. Use CHG as you would any other liquid soap. You can apply CHG directly to the skin and  wash gently with a scrungie or a clean washcloth.  5. Apply the CHG soap to your body only from the neck down. Do not use on open wounds or open sores. Avoid contact with your eyes, ears, mouth, and genitals (private parts). Wash face and genitals (private parts) with your normal soap.  6. Wash thoroughly, paying special attention to the area where your surgery will be performed.  7. Thoroughly rinse your body with warm water .  8. Do not shower/wash with your normal soap after using and rinsing off the CHG soap.  9. Pat yourself dry with a clean towel.  10. Wear clean pajamas to bed the night before surgery.  12. Place clean sheets on your bed the night of your first shower and do not sleep with pets.  13. Shower again with the CHG soap on the day of surgery prior to arriving at the hospital.  14. Do not apply any deodorants/lotions/powders.  15. Please wear clean clothes to the hospital.   How to Use an Incentive Spirometer  An incentive spirometer is a tool that measures how well you are filling your lungs with each breath. Learning to take long, deep breaths using this tool can help you keep your lungs clear and active. This may help to reverse or lessen your chance of developing breathing (pulmonary) problems, especially infection. You may be asked to use a spirometer: After a surgery. If you have a lung problem or a history of smoking. After a long  period of time when you have been unable to move or be active. If the spirometer includes an indicator to show the highest number that you have reached, your health care provider or respiratory therapist will help you set a goal. Keep a log of your progress as told by your health care provider. What are the risks? Breathing too quickly may cause dizziness or cause you to pass out. Take your time so you do not get dizzy or light-headed. If you are in pain, you may need to take pain medicine before doing incentive spirometry. It is harder to take a deep breath if you are having pain. How to use your incentive spirometer  Sit up on the edge of your bed or on a chair. Hold the incentive spirometer so that it is in an upright position. Before you use the spirometer, breathe out normally. Place the mouthpiece in your mouth. Make sure your lips are closed tightly around it. Breathe in slowly and as deeply as you can through your mouth, causing the piston or the ball to rise toward the top of the chamber. Hold your breath for 3-5 seconds, or for as long as possible. If the spirometer includes a coach indicator, use this to guide you in breathing. Slow down your breathing if the indicator goes above the marked areas. Remove the mouthpiece from your mouth and breathe out normally. The piston or ball will return to the bottom of the chamber. Rest for a few seconds, then repeat the steps 10 or more times. Take your time and take a few normal breaths between deep breaths so that you do not get dizzy or light-headed. Do this every 1-2 hours when you are awake. If the spirometer includes a goal marker to show the highest number you have reached (best effort), use this as a goal to work toward during each repetition. After each set of 10 deep breaths, cough a few times. This will help to make sure that your lungs are clear.  If you have an incision on your chest or abdomen from surgery, place a pillow or a  rolled-up towel firmly against the incision when you cough. This can help to reduce pain while taking deep breaths and coughing. General tips When you are able to get out of bed: Walk around often. Continue to take deep breaths and cough in order to clear your lungs. Keep using the incentive spirometer until your health care provider says it is okay to stop using it. If you have been in the hospital, you may be told to keep using the spirometer at home. Contact a health care provider if: You are having difficulty using the spirometer. You have trouble using the spirometer as often as instructed. Your pain medicine is not giving enough relief for you to use the spirometer as told. You have a fever. Get help right away if: You develop shortness of breath. You develop a cough with bloody mucus from the lungs. You have fluid or blood coming from an incision site after you cough. Summary An incentive spirometer is a tool that can help you learn to take long, deep breaths to keep your lungs clear and active. You may be asked to use a spirometer after a surgery, if you have a lung problem or a history of smoking, or if you have been inactive for a long period of time. Use your incentive spirometer as instructed every 1-2 hours while you are awake. If you have an incision on your chest or abdomen, place a pillow or a rolled-up towel firmly against your incision when you cough. This will help to reduce pain. Get help right away if you have shortness of breath, you cough up bloody mucus, or blood comes from your incision when you cough. This information is not intended to replace advice given to you by your health care provider. Make sure you discuss any questions you have with your health care provider. Document Revised: 06/25/2019 Document Reviewed: 06/25/2019 Elsevier Patient Education  2023 [employer].

## 2023-12-30 NOTE — Telephone Encounter (Signed)
 Please advise [facility]

## 2024-01-02 ENCOUNTER — Encounter
Admission: RE | Admit: 2024-01-02 | Discharge: 2024-01-02 | Disposition: A | Discharge status: Home / Self Care | Source: Ambulatory Visit | Attending: Podiatry | Admitting: Podiatry

## 2024-01-02 ENCOUNTER — Telehealth: Payer: Self-pay

## 2024-01-02 DIAGNOSIS — I10 Essential (primary) hypertension: Secondary | ICD-10-CM | POA: Insufficient documentation

## 2024-01-02 DIAGNOSIS — Z01810 Encounter for preprocedural cardiovascular examination: Secondary | ICD-10-CM | POA: Diagnosis not present

## 2024-01-02 DIAGNOSIS — E118 Type 2 diabetes mellitus with unspecified complications: Secondary | ICD-10-CM | POA: Diagnosis not present

## 2024-01-02 NOTE — Telephone Encounter (Signed)
 Copied from CRM #[id number]. Topic: Clinical - Medical Advice >> Dec 30, 2023  3:34 PM Harlene ORN wrote: Reason for CRM: Patient called. He will be having surgery on the 19th. Needs to be cleared by his PCP for H&P.  Form was in your folder . Pt needed labs as well . KH

## 2024-01-03 ENCOUNTER — Encounter: Payer: Self-pay | Admitting: Nurse Practitioner

## 2024-01-03 ENCOUNTER — Other Ambulatory Visit: Payer: Self-pay

## 2024-01-03 ENCOUNTER — Ambulatory Visit (INDEPENDENT_AMBULATORY_CARE_PROVIDER_SITE_OTHER): Admitting: Nurse Practitioner

## 2024-01-03 VITALS — BP 122/78 | HR 75 | Wt 214.0 lb

## 2024-01-03 DIAGNOSIS — M25571 Pain in right ankle and joints of right foot: Secondary | ICD-10-CM | POA: Diagnosis not present

## 2024-01-03 DIAGNOSIS — G8929 Other chronic pain: Secondary | ICD-10-CM | POA: Diagnosis not present

## 2024-01-03 MED ORDER — OXYCODONE-ACETAMINOPHEN 5-325 MG PO TABS
1.0000 | ORAL_TABLET | Freq: Three times a day (TID) | ORAL | 0 refills | Status: DC | PRN
  Filled 2024-01-03: qty 5, 2d supply, fill #0

## 2024-01-03 MED ORDER — CYCLOBENZAPRINE HCL 10 MG PO TABS
10.0000 mg | ORAL_TABLET | Freq: Three times a day (TID) | ORAL | 0 refills | Status: AC | PRN
  Filled 2024-01-03: qty 30, 10d supply, fill #0

## 2024-01-03 NOTE — Progress Notes (Signed)
 Subjective   Patient ID: Aaron Kennedy, male    DOB: April 26, 1970, 53 y.o.   MRN: [id number]  Chief Complaint  Patient presents with   Annual Exam    Patient need a HMI to be cleared for surgery on Friday     Referring provider: Oley Bascom RAMAN, NP  [name] Medicine is a 53 y.o. male with Past Medical History: No date: Ankle instability, right No date: Anxiety No date: Arthritis No date: Chronic pain of right knee 08/26/2021: DM type 2, controlled, with complication (HCC) No date: Hypertension 01/2019: Insomnia No date: Osteochondral defect of talus No date: PAD (peripheral artery disease) (HCC) No date: Peroneal tendinitis, right No date: Posterior tibial tendonitis, right No date: Stroke [facility])     Comment:  right side weakness   HPI  Patient presents today for surgical clearance.  Patient is scheduled for ankle arthroscopy with [facility].  Overall patient is doing well vital signs are stable in office today.  Patient does have patient in the chart.  Most recent A1c was 6.6. Denies f/c/s, n/v/d, hemoptysis, PND, leg swelling Denies chest pain or edema     No Known Allergies  Immunization History  Administered Date(s) Administered   Influenza, Seasonal, Injecte, Preservative Fre 12/23/2022   Influenza,inj,Quad PF,6+ Mos 12/29/2015, 02/06/2019, 02/25/2021, 03/01/2022   Pneumococcal Polysaccharide-23 09/26/2015   Tdap 12/29/2015    Tobacco History: Social History   Tobacco Use  Smoking Status Every Day   Current packs/day: 0.50   Average packs/day: 0.5 packs/day for 20.0 years (10.0 ttl pk-yrs)   Types: Cigarettes  Smokeless Tobacco Never   Ready to quit: Not Answered Counseling given: Yes   Outpatient Encounter Medications as of 01/03/2024  Medication Sig   Accu-Chek Softclix Lancets lancets Check up to twice daily.   acetaminophen  (TYLENOL ) 500 MG tablet Take 2 tablets (1,000 mg total) by mouth every 6 (six) hours as needed.   Blood  Glucose Monitoring Suppl (BLOOD GLUCOSE MONITOR SYSTEM) w/Device KIT Use as directed up to twice daily.   cyclobenzaprine  (FLEXERIL ) 10 MG tablet Take 1 tablet (10 mg total) by mouth 3 (three) times daily as needed for muscle spasms.   Evolocumab  (REPATHA  SURECLICK) 140 MG/ML SOAJ Inject 140 mg into the skin every 14 (fourteen) days.   [START ON 01/06/2024] gabapentin  (NEURONTIN ) 600 MG tablet Take 1 tablet (600 mg total) by mouth 2 (two) times daily.   Glucose Blood (BLOOD GLUCOSE TEST STRIPS) STRP Check up to twice daily.   Lancet Device MISC Check up to twice daily. May substitute to any manufacturer covered by patient's insurance.   lisinopril -hydrochlorothiazide  (ZESTORETIC ) 10-12.5 MG tablet TAKE 1 TABLET BY MOUTH DAILY.   metoprolol  succinate (TOPROL -XL) 25 MG 24 hr tablet Take 1 tablet (25 mg total) by mouth daily.   NON FORMULARY Take 2 mg by mouth at bedtime. Klonopin 1 mg   oxyCODONE -acetaminophen  (PERCOCET/ROXICET) 5-325 MG tablet Take 1 tablet by mouth every 8 (eight) hours as needed for up to 5 days for severe pain (pain score 7-10).   tetrahydrozoline 0.05 % ophthalmic solution Place 1-2 drops into both eyes daily as needed (eye irritation.).   Vitamin D , Ergocalciferol , (DRISDOL ) 1.25 MG (50000 UNIT) CAPS capsule Take 1 capsule (50,000 Units total) by mouth every 7 (seven) days.   atorvastatin  (LIPITOR) 80 MG tablet Take 1 tablet (80 mg total) by mouth daily. (Patient taking differently: Take 80 mg by mouth every evening.)   clopidogrel  (PLAVIX ) 75 MG tablet Take  1 tablet (75 mg total) by mouth daily. (Patient taking differently: Take 75 mg by mouth every other day.)   metFORMIN  (GLUCOPHAGE -XR) 500 MG 24 hr tablet Take 2 tablets (1,000 mg total) by mouth daily with breakfast. (Patient taking differently: Take 500 mg by mouth in the morning and at bedtime.)   Semaglutide , 1 MG/DOSE, 4 MG/3ML SOPN Inject 1 mg as directed once a week. (Patient taking differently: Inject 1 mg as directed  every Thursday.)   No facility-administered encounter medications on file as of 01/03/2024.    Review of Systems  Review of Systems  Constitutional: Negative.   HENT: Negative.    Cardiovascular: Negative.   Gastrointestinal: Negative.   Allergic/Immunologic: Negative.   Neurological: Negative.   Psychiatric/Behavioral: Negative.       Objective:   BP 122/78   Pulse 75   Wt 214 lb (97.1 kg)   SpO2 96%   BMI 29.85 kg/m   Wt Readings from Last 5 Encounters:  01/03/24 214 lb (97.1 kg)  11/29/23 212 lb (96.2 kg)  11/25/23 212 lb (96.2 kg)  11/10/23 212 lb (96.2 kg)  11/02/23 212 lb (96.2 kg)     Physical Exam Vitals and nursing note reviewed.  Constitutional:      General: He is not in acute distress.    Appearance: He is well-developed.  Cardiovascular:     Rate and Rhythm: Normal rate and regular rhythm.  Pulmonary:     Effort: Pulmonary effort is normal.     Breath sounds: Normal breath sounds.  Skin:    General: Skin is warm and dry.  Neurological:     Mental Status: He is alert and oriented to person, place, and time.       Assessment & Plan:   Chronic pain of right ankle  Other orders -     Cyclobenzaprine  HCl; Take 1 tablet (10 mg total) by mouth 3 (three) times daily as needed for muscle spasms.  Dispense: 30 tablet; Refill: 0 -     oxyCODONE -Acetaminophen ; Take 1 tablet by mouth every 8 (eight) hours as needed for up to 5 days for severe pain (pain score 7-10).  Dispense: 5 tablet; Refill: 0       Return if symptoms worsen or fail to improve.     Bascom GORMAN Borer, NP 01/03/2024

## 2024-01-03 NOTE — H&P (View-Only) (Signed)
 Subjective   Patient ID: Aaron Kennedy, male    DOB: April 26, 1970, 53 y.o.   MRN: [id number]  Chief Complaint  Patient presents with   Annual Exam    Patient need a HMI to be cleared for surgery on Friday     Referring provider: Oley Bascom RAMAN, NP  [name] Medicine is a 53 y.o. male with Past Medical History: No date: Ankle instability, right No date: Anxiety No date: Arthritis No date: Chronic pain of right knee 08/26/2021: DM type 2, controlled, with complication (HCC) No date: Hypertension 01/2019: Insomnia No date: Osteochondral defect of talus No date: PAD (peripheral artery disease) (HCC) No date: Peroneal tendinitis, right No date: Posterior tibial tendonitis, right No date: Stroke [facility])     Comment:  right side weakness   HPI  Patient presents today for surgical clearance.  Patient is scheduled for ankle arthroscopy with [facility].  Overall patient is doing well vital signs are stable in office today.  Patient does have patient in the chart.  Most recent A1c was 6.6. Denies f/c/s, n/v/d, hemoptysis, PND, leg swelling Denies chest pain or edema     No Known Allergies  Immunization History  Administered Date(s) Administered   Influenza, Seasonal, Injecte, Preservative Fre 12/23/2022   Influenza,inj,Quad PF,6+ Mos 12/29/2015, 02/06/2019, 02/25/2021, 03/01/2022   Pneumococcal Polysaccharide-23 09/26/2015   Tdap 12/29/2015    Tobacco History: Social History   Tobacco Use  Smoking Status Every Day   Current packs/day: 0.50   Average packs/day: 0.5 packs/day for 20.0 years (10.0 ttl pk-yrs)   Types: Cigarettes  Smokeless Tobacco Never   Ready to quit: Not Answered Counseling given: Yes   Outpatient Encounter Medications as of 01/03/2024  Medication Sig   Accu-Chek Softclix Lancets lancets Check up to twice daily.   acetaminophen  (TYLENOL ) 500 MG tablet Take 2 tablets (1,000 mg total) by mouth every 6 (six) hours as needed.   Blood  Glucose Monitoring Suppl (BLOOD GLUCOSE MONITOR SYSTEM) w/Device KIT Use as directed up to twice daily.   cyclobenzaprine  (FLEXERIL ) 10 MG tablet Take 1 tablet (10 mg total) by mouth 3 (three) times daily as needed for muscle spasms.   Evolocumab  (REPATHA  SURECLICK) 140 MG/ML SOAJ Inject 140 mg into the skin every 14 (fourteen) days.   [START ON 01/06/2024] gabapentin  (NEURONTIN ) 600 MG tablet Take 1 tablet (600 mg total) by mouth 2 (two) times daily.   Glucose Blood (BLOOD GLUCOSE TEST STRIPS) STRP Check up to twice daily.   Lancet Device MISC Check up to twice daily. May substitute to any manufacturer covered by patient's insurance.   lisinopril -hydrochlorothiazide  (ZESTORETIC ) 10-12.5 MG tablet TAKE 1 TABLET BY MOUTH DAILY.   metoprolol  succinate (TOPROL -XL) 25 MG 24 hr tablet Take 1 tablet (25 mg total) by mouth daily.   NON FORMULARY Take 2 mg by mouth at bedtime. Klonopin 1 mg   oxyCODONE -acetaminophen  (PERCOCET/ROXICET) 5-325 MG tablet Take 1 tablet by mouth every 8 (eight) hours as needed for up to 5 days for severe pain (pain score 7-10).   tetrahydrozoline 0.05 % ophthalmic solution Place 1-2 drops into both eyes daily as needed (eye irritation.).   Vitamin D , Ergocalciferol , (DRISDOL ) 1.25 MG (50000 UNIT) CAPS capsule Take 1 capsule (50,000 Units total) by mouth every 7 (seven) days.   atorvastatin  (LIPITOR) 80 MG tablet Take 1 tablet (80 mg total) by mouth daily. (Patient taking differently: Take 80 mg by mouth every evening.)   clopidogrel  (PLAVIX ) 75 MG tablet Take  1 tablet (75 mg total) by mouth daily. (Patient taking differently: Take 75 mg by mouth every other day.)   metFORMIN  (GLUCOPHAGE -XR) 500 MG 24 hr tablet Take 2 tablets (1,000 mg total) by mouth daily with breakfast. (Patient taking differently: Take 500 mg by mouth in the morning and at bedtime.)   Semaglutide , 1 MG/DOSE, 4 MG/3ML SOPN Inject 1 mg as directed once a week. (Patient taking differently: Inject 1 mg as directed  every Thursday.)   No facility-administered encounter medications on file as of 01/03/2024.    Review of Systems  Review of Systems  Constitutional: Negative.   HENT: Negative.    Cardiovascular: Negative.   Gastrointestinal: Negative.   Allergic/Immunologic: Negative.   Neurological: Negative.   Psychiatric/Behavioral: Negative.       Objective:   BP 122/78   Pulse 75   Wt 214 lb (97.1 kg)   SpO2 96%   BMI 29.85 kg/m   Wt Readings from Last 5 Encounters:  01/03/24 214 lb (97.1 kg)  11/29/23 212 lb (96.2 kg)  11/25/23 212 lb (96.2 kg)  11/10/23 212 lb (96.2 kg)  11/02/23 212 lb (96.2 kg)     Physical Exam Vitals and nursing note reviewed.  Constitutional:      General: He is not in acute distress.    Appearance: He is well-developed.  Cardiovascular:     Rate and Rhythm: Normal rate and regular rhythm.  Pulmonary:     Effort: Pulmonary effort is normal.     Breath sounds: Normal breath sounds.  Skin:    General: Skin is warm and dry.  Neurological:     Mental Status: He is alert and oriented to person, place, and time.       Assessment & Plan:   Chronic pain of right ankle  Other orders -     Cyclobenzaprine  HCl; Take 1 tablet (10 mg total) by mouth 3 (three) times daily as needed for muscle spasms.  Dispense: 30 tablet; Refill: 0 -     oxyCODONE -Acetaminophen ; Take 1 tablet by mouth every 8 (eight) hours as needed for up to 5 days for severe pain (pain score 7-10).  Dispense: 5 tablet; Refill: 0       Return if symptoms worsen or fail to improve.     Bascom GORMAN Borer, NP 01/03/2024

## 2024-01-06 ENCOUNTER — Encounter
Admission: RE | Disposition: A | Discharge status: Home / Self Care | Payer: Self-pay | Source: Home / Self Care | Attending: Podiatry

## 2024-01-06 ENCOUNTER — Ambulatory Visit

## 2024-01-06 ENCOUNTER — Other Ambulatory Visit: Payer: Self-pay

## 2024-01-06 ENCOUNTER — Ambulatory Visit: Payer: Self-pay | Admitting: Anesthesiology

## 2024-01-06 ENCOUNTER — Ambulatory Visit: Payer: Self-pay | Admitting: Urgent Care

## 2024-01-06 ENCOUNTER — Encounter: Payer: Self-pay | Admitting: Podiatry

## 2024-01-06 ENCOUNTER — Ambulatory Visit
Admission: RE | Admit: 2024-01-06 | Discharge: 2024-01-06 | Disposition: A | Discharge status: Home / Self Care | Attending: Podiatry | Admitting: Podiatry

## 2024-01-06 DIAGNOSIS — Z7984 Long term (current) use of oral hypoglycemic drugs: Secondary | ICD-10-CM | POA: Diagnosis not present

## 2024-01-06 DIAGNOSIS — Z79899 Other long term (current) drug therapy: Secondary | ICD-10-CM | POA: Insufficient documentation

## 2024-01-06 DIAGNOSIS — M76821 Posterior tibial tendinitis, right leg: Secondary | ICD-10-CM | POA: Insufficient documentation

## 2024-01-06 DIAGNOSIS — M24071 Loose body in right ankle: Secondary | ICD-10-CM | POA: Diagnosis not present

## 2024-01-06 DIAGNOSIS — M67471 Ganglion, right ankle and foot: Secondary | ICD-10-CM

## 2024-01-06 DIAGNOSIS — S8251XK Displaced fracture of medial malleolus of right tibia, subsequent encounter for closed fracture with nonunion: Secondary | ICD-10-CM | POA: Diagnosis not present

## 2024-01-06 DIAGNOSIS — M958 Other specified acquired deformities of musculoskeletal system: Secondary | ICD-10-CM

## 2024-01-06 DIAGNOSIS — K219 Gastro-esophageal reflux disease without esophagitis: Secondary | ICD-10-CM | POA: Diagnosis not present

## 2024-01-06 DIAGNOSIS — M7671 Peroneal tendinitis, right leg: Secondary | ICD-10-CM | POA: Diagnosis not present

## 2024-01-06 DIAGNOSIS — I6990 Unspecified sequelae of unspecified cerebrovascular disease: Secondary | ICD-10-CM | POA: Diagnosis not present

## 2024-01-06 DIAGNOSIS — I10 Essential (primary) hypertension: Secondary | ICD-10-CM | POA: Insufficient documentation

## 2024-01-06 DIAGNOSIS — G8929 Other chronic pain: Secondary | ICD-10-CM | POA: Diagnosis not present

## 2024-01-06 DIAGNOSIS — Z7902 Long term (current) use of antithrombotics/antiplatelets: Secondary | ICD-10-CM | POA: Diagnosis not present

## 2024-01-06 DIAGNOSIS — M93971 Osteochondropathy, unspecified, right ankle and foot: Secondary | ICD-10-CM | POA: Insufficient documentation

## 2024-01-06 DIAGNOSIS — M65871 Other synovitis and tenosynovitis, right ankle and foot: Secondary | ICD-10-CM | POA: Diagnosis not present

## 2024-01-06 DIAGNOSIS — M25371 Other instability, right ankle: Secondary | ICD-10-CM | POA: Diagnosis not present

## 2024-01-06 DIAGNOSIS — I110 Hypertensive heart disease with heart failure: Secondary | ICD-10-CM | POA: Diagnosis not present

## 2024-01-06 DIAGNOSIS — G8918 Other acute postprocedural pain: Secondary | ICD-10-CM | POA: Diagnosis not present

## 2024-01-06 DIAGNOSIS — S8251XA Displaced fracture of medial malleolus of right tibia, initial encounter for closed fracture: Secondary | ICD-10-CM

## 2024-01-06 DIAGNOSIS — R569 Unspecified convulsions: Secondary | ICD-10-CM | POA: Diagnosis not present

## 2024-01-06 DIAGNOSIS — S93421A Sprain of deltoid ligament of right ankle, initial encounter: Secondary | ICD-10-CM

## 2024-01-06 DIAGNOSIS — I5040 Unspecified combined systolic (congestive) and diastolic (congestive) heart failure: Secondary | ICD-10-CM | POA: Diagnosis not present

## 2024-01-06 DIAGNOSIS — F17210 Nicotine dependence, cigarettes, uncomplicated: Secondary | ICD-10-CM | POA: Diagnosis not present

## 2024-01-06 DIAGNOSIS — M65969 Unspecified synovitis and tenosynovitis, unspecified lower leg: Secondary | ICD-10-CM

## 2024-01-06 DIAGNOSIS — E1151 Type 2 diabetes mellitus with diabetic peripheral angiopathy without gangrene: Secondary | ICD-10-CM | POA: Diagnosis not present

## 2024-01-06 DIAGNOSIS — M65971 Unspecified synovitis and tenosynovitis, right ankle and foot: Secondary | ICD-10-CM | POA: Insufficient documentation

## 2024-01-06 DIAGNOSIS — E118 Type 2 diabetes mellitus with unspecified complications: Secondary | ICD-10-CM

## 2024-01-06 HISTORY — PX: ANKLE ARTHROSCOPY WITH DRILLING/MICROFRACTURE: SHX5580

## 2024-01-06 HISTORY — PX: LIGAMENT REPAIR: SHX5444

## 2024-01-06 LAB — GLUCOSE, CAPILLARY
Glucose-Capillary: 90 mg/dL (ref 70–99)
Glucose-Capillary: 138 mg/dL — ABNORMAL HIGH (ref 70–99)

## 2024-01-06 SURGERY — ARTHROSCOPY, ANKLE, WITH MICROFRACTURE
Anesthesia: General | Site: Ankle | Laterality: Right

## 2024-01-06 MED ORDER — FENTANYL CITRATE (PF) 100 MCG/2ML IJ SOLN
INTRAMUSCULAR | Status: DC | PRN
  Administered 2024-01-06 (×4): 50 ug via INTRAVENOUS

## 2024-01-06 MED ORDER — CEFAZOLIN SODIUM-DEXTROSE 2-4 GM/100ML-% IV SOLN
INTRAVENOUS | Status: AC
  Filled 2024-01-06: qty 100

## 2024-01-06 MED ORDER — ACETAMINOPHEN 10 MG/ML IV SOLN
INTRAVENOUS | Status: DC | PRN
  Administered 2024-01-06: 1000 mg via INTRAVENOUS

## 2024-01-06 MED ORDER — SODIUM CHLORIDE 0.9 % IV SOLN: INTRAVENOUS | Status: DC

## 2024-01-06 MED ORDER — ACETAMINOPHEN 10 MG/ML IV SOLN
INTRAVENOUS | Status: AC
  Filled 2024-01-06: qty 100

## 2024-01-06 MED ORDER — LIDOCAINE HCL (PF) 1 % IJ SOLN
INTRAMUSCULAR | Status: DC | PRN
  Administered 2024-01-06 (×2): 3 mL

## 2024-01-06 MED ORDER — CEFAZOLIN SODIUM-DEXTROSE 2-4 GM/100ML-% IV SOLN
2.0000 g | INTRAVENOUS | Status: AC
  Administered 2024-01-06: 2 g via INTRAVENOUS

## 2024-01-06 MED ORDER — CHLORHEXIDINE GLUCONATE 0.12 % MT SOLN
OROMUCOSAL | Status: AC
Start: 2024-01-06 — End: 2024-01-06
  Filled 2024-01-06: qty 15

## 2024-01-06 MED ORDER — DEXMEDETOMIDINE HCL IN NACL 80 MCG/20ML IV SOLN
INTRAVENOUS | Status: DC | PRN
  Administered 2024-01-06: 4 ug via INTRAVENOUS
  Administered 2024-01-06 (×2): 8 ug via INTRAVENOUS

## 2024-01-06 MED ORDER — BUPIVACAINE LIPOSOME 1.3 % IJ SUSP
INTRAMUSCULAR | Status: DC | PRN
  Administered 2024-01-06 (×2): 10 mL

## 2024-01-06 MED ORDER — OXYCODONE HCL 5 MG PO TABS
ORAL_TABLET | ORAL | Status: AC
  Filled 2024-01-06: qty 1

## 2024-01-06 MED ORDER — FENTANYL CITRATE (PF) 100 MCG/2ML IJ SOLN
INTRAMUSCULAR | Status: AC
  Filled 2024-01-06: qty 2

## 2024-01-06 MED ORDER — KETAMINE HCL 50 MG/5ML IJ SOSY
PREFILLED_SYRINGE | INTRAMUSCULAR | Status: AC
Start: 2024-01-06 — End: 2024-01-06
  Filled 2024-01-06: qty 5

## 2024-01-06 MED ORDER — CHLORHEXIDINE GLUCONATE 0.12 % MT SOLN
15.0000 mL | Freq: Once | OROMUCOSAL | Status: AC
  Administered 2024-01-06: 15 mL via OROMUCOSAL

## 2024-01-06 MED ORDER — BUPIVACAINE LIPOSOME 1.3 % IJ SUSP
INTRAMUSCULAR | Status: AC
  Filled 2024-01-06: qty 20

## 2024-01-06 MED ORDER — OXYCODONE HCL 5 MG PO TABS
5.0000 mg | ORAL_TABLET | Freq: Four times a day (QID) | ORAL | Status: DC | PRN
  Administered 2024-01-06: 5 mg via ORAL

## 2024-01-06 MED ORDER — PROPOFOL 10 MG/ML IV BOLUS
INTRAVENOUS | Status: AC
  Filled 2024-01-06: qty 20

## 2024-01-06 MED ORDER — ONDANSETRON HCL 4 MG/2ML IJ SOLN
INTRAMUSCULAR | Status: DC | PRN
  Administered 2024-01-06: 4 mg via INTRAVENOUS

## 2024-01-06 MED ORDER — ESMOLOL HCL 100 MG/10ML IV SOLN
INTRAVENOUS | Status: DC | PRN
  Administered 2024-01-06: 20 mg via INTRAVENOUS

## 2024-01-06 MED ORDER — BUPIVACAINE HCL (PF) 0.5 % IJ SOLN
INTRAMUSCULAR | Status: DC | PRN
  Administered 2024-01-06: 7 mL

## 2024-01-06 MED ORDER — MIDAZOLAM HCL 2 MG/2ML IJ SOLN
1.0000 mg | INTRAMUSCULAR | Status: DC | PRN
  Administered 2024-01-06: 1 mg via INTRAVENOUS

## 2024-01-06 MED ORDER — 0.9 % SODIUM CHLORIDE (POUR BTL) OPTIME
TOPICAL | Status: DC | PRN
  Administered 2024-01-06: 500 mL

## 2024-01-06 MED ORDER — FENTANYL CITRATE (PF) 100 MCG/2ML IJ SOLN: 25.0000 ug | INTRAMUSCULAR | Status: DC | PRN

## 2024-01-06 MED ORDER — HYDROMORPHONE HCL 1 MG/ML IJ SOLN
INTRAMUSCULAR | Status: DC | PRN
Start: 2024-01-06 — End: 2024-01-09
  Administered 2024-01-06: 1 mg via INTRAVENOUS

## 2024-01-06 MED ORDER — BUPIVACAINE HCL (PF) 0.5 % IJ SOLN
INTRAMUSCULAR | Status: AC
  Filled 2024-01-06: qty 30

## 2024-01-06 MED ORDER — DEXAMETHASONE SODIUM PHOSPHATE 10 MG/ML IJ SOLN
INTRAMUSCULAR | Status: DC | PRN
  Administered 2024-01-06: 5 mg via INTRAVENOUS

## 2024-01-06 MED ORDER — ORAL CARE MOUTH RINSE: 15.0000 mL | Freq: Once | OROMUCOSAL | Status: AC

## 2024-01-06 MED ORDER — HYDROMORPHONE HCL 1 MG/ML IJ SOLN
INTRAMUSCULAR | Status: AC
  Filled 2024-01-06: qty 1

## 2024-01-06 MED ORDER — DEXMEDETOMIDINE HCL IN NACL 80 MCG/20ML IV SOLN
INTRAVENOUS | Status: AC
  Filled 2024-01-06: qty 20

## 2024-01-06 MED ORDER — ESMOLOL HCL 100 MG/10ML IV SOLN
INTRAVENOUS | Status: AC
Start: 2024-01-06 — End: 2024-01-06
  Filled 2024-01-06: qty 10

## 2024-01-06 MED ORDER — LIDOCAINE HCL (PF) 1 % IJ SOLN
INTRAMUSCULAR | Status: AC
  Filled 2024-01-06: qty 5

## 2024-01-06 MED ORDER — PROPOFOL 10 MG/ML IV BOLUS
INTRAVENOUS | Status: DC | PRN
  Administered 2024-01-06: 130 ug/kg/min via INTRAVENOUS

## 2024-01-06 MED ORDER — LIDOCAINE HCL (CARDIAC) PF 100 MG/5ML IV SOSY
PREFILLED_SYRINGE | INTRAVENOUS | Status: DC | PRN
  Administered 2024-01-06: 100 mg via INTRAVENOUS

## 2024-01-06 MED ORDER — BUPIVACAINE HCL (PF) 0.5 % IJ SOLN
INTRAMUSCULAR | Status: DC | PRN
  Administered 2024-01-06 (×2): 10 mL

## 2024-01-06 MED ORDER — MIDAZOLAM HCL 2 MG/2ML IJ SOLN
INTRAMUSCULAR | Status: AC
  Filled 2024-01-06: qty 2

## 2024-01-06 MED ORDER — DROPERIDOL 2.5 MG/ML IJ SOLN: 0.6250 mg | Freq: Once | INTRAMUSCULAR | Status: DC | PRN

## 2024-01-06 MED ORDER — PROPOFOL 1000 MG/100ML IV EMUL
INTRAVENOUS | Status: AC
Start: 2024-01-06 — End: 2024-01-06
  Filled 2024-01-06: qty 100

## 2024-01-06 MED ORDER — KETAMINE HCL 50 MG/5ML IJ SOSY
PREFILLED_SYRINGE | INTRAMUSCULAR | Status: DC | PRN
  Administered 2024-01-06: 20 mg via INTRAVENOUS
  Administered 2024-01-06: 30 mg via INTRAVENOUS

## 2024-01-06 MED ORDER — LIDOCAINE-EPINEPHRINE (PF) 1 %-1:200000 IJ SOLN
INTRAMUSCULAR | Status: AC
  Filled 2024-01-06: qty 30

## 2024-01-06 MED ORDER — BUPIVACAINE HCL (PF) 0.5 % IJ SOLN
INTRAMUSCULAR | Status: AC
  Filled 2024-01-06: qty 20

## 2024-01-06 MED ORDER — OXYCODONE HCL 5 MG PO TABS
5.0000 mg | ORAL_TABLET | Freq: Four times a day (QID) | ORAL | 0 refills | Status: DC | PRN
  Filled 2024-01-06: qty 20, 5d supply, fill #0

## 2024-01-06 SURGICAL SUPPLY — 93 items
ADAPTER IRRIG TUBE 2 SPIKE SOL (ADAPTER) ×4 IMPLANT
ANCHOR KNOTLESS SUT DX #2 (Anchor) IMPLANT
BLADE MED AGGRESSIVE (BLADE) ×2 IMPLANT
BLADE SHAVER 2.9D 7 MINI (BLADE) ×2 IMPLANT
BLADE SHAVER AGGRES 3.5 (CUTTER) IMPLANT
BLADE SURG 15 STRL LF DISP TIS (BLADE) ×2 IMPLANT
BNDG COHESIVE 4X5 TAN STRL LF (GAUZE/BANDAGES/DRESSINGS) ×2 IMPLANT
BNDG COHESIVE 6X5 TAN ST LF (GAUZE/BANDAGES/DRESSINGS) ×2 IMPLANT
BNDG COMPR 6X5.8 VLCR NS LF (GAUZE/BANDAGES/DRESSINGS) ×4 IMPLANT
BNDG ELASTIC 4INX 5YD STR LF (GAUZE/BANDAGES/DRESSINGS) ×2 IMPLANT
BNDG ELASTIC 4X5.8 VLCR NS LF (GAUZE/BANDAGES/DRESSINGS) ×4 IMPLANT
BNDG ELASTIC 6INX 5YD STR LF (GAUZE/BANDAGES/DRESSINGS) ×2 IMPLANT
BNDG ESMARCH 4X12 STRL LF (GAUZE/BANDAGES/DRESSINGS) ×2 IMPLANT
BNDG GAUZE DERMACEA FLUFF 4 (GAUZE/BANDAGES/DRESSINGS) ×2 IMPLANT
BNDG STRETCH GAUZE 3IN X12FT (GAUZE/BANDAGES/DRESSINGS) ×2 IMPLANT
BUR ABRADER 4.0 W/FLUTE AQUA (MISCELLANEOUS) IMPLANT
CHLORAPREP W/TINT 26 (MISCELLANEOUS) ×2 IMPLANT
COVER LIGHT HANDLE STERIS (MISCELLANEOUS) ×2 IMPLANT
CUFF TOURN SGL QUICK 18X4 (TOURNIQUET CUFF) IMPLANT
CUFF TRNQT CYL 24X4X16.5-23 (TOURNIQUET CUFF) IMPLANT
DRAPE ARTHROSCOPY W/POUCH 90 (DRAPES) ×2 IMPLANT
DRAPE C-ARM XRAY 36X54 (DRAPES) ×2 IMPLANT
DRAPE IMP U-DRAPE 54X76 (DRAPES) ×2 IMPLANT
DRAPE SHEET LG 3/4 BI-LAMINATE (DRAPES) ×2 IMPLANT
DRAPE U-SHAPE 47X51 STRL (DRAPES) ×2 IMPLANT
DRSG EMULSION OIL 3X3 NADH (GAUZE/BANDAGES/DRESSINGS) ×2 IMPLANT
DURAPREP 26ML APPLICATOR (WOUND CARE) ×2 IMPLANT
ELECTRODE REM PT RTRN 9FT ADLT (ELECTROSURGICAL) ×2 IMPLANT
ETHIBOND 2 0 GREEN CT 2 30IN (SUTURE) ×2 IMPLANT
GAUZE PAD ABD 8X10 STRL (GAUZE/BANDAGES/DRESSINGS) ×6 IMPLANT
GAUZE SPONGE 4X4 12PLY STRL (GAUZE/BANDAGES/DRESSINGS) ×2 IMPLANT
GAUZE STRETCH 2X75IN STRL (MISCELLANEOUS) ×2 IMPLANT
GAUZE XEROFORM 1X8 LF (GAUZE/BANDAGES/DRESSINGS) ×2 IMPLANT
GLOVE PI ORTHO PRO STRL 7.5 (GLOVE) ×2 IMPLANT
GLOVE PI ORTHO PRO STRL SZ7 (GLOVE) ×2 IMPLANT
GOWN STRL REUS W/ TWL LRG LVL3 (GOWN DISPOSABLE) ×2 IMPLANT
IMPL INTERNAL BRACE BIO (Anchor) IMPLANT
IV LR IRRIG 3000ML ARTHROMATIC (IV SOLUTION) ×10 IMPLANT
KIT BONE MRW ASP ANGEL CPRP (KITS) IMPLANT
KIT FIBERTAK DX KNTLS INSTBLT (Anchor) IMPLANT
KIT TURNOVER KIT A (KITS) ×2 IMPLANT
KWIRE DBL .062X4 NSTRL (WIRE) IMPLANT
LABEL OR SOLS (LABEL) ×2 IMPLANT
MANIFOLD NEPTUNE II (INSTRUMENTS) ×4 IMPLANT
NDL HYPO 22X1.5 SAFETY MO (MISCELLANEOUS) ×2 IMPLANT
NDL HYPO 25X1 1.5 SAFETY (NEEDLE) ×2 IMPLANT
NDL SAFETY ECLIPSE 18X1.5 (NEEDLE) ×2 IMPLANT
NEEDLE HYPO 22X1.5 SAFETY MO (MISCELLANEOUS) ×2 IMPLANT
NEEDLE HYPO 25X1 1.5 SAFETY (NEEDLE) ×2 IMPLANT
NS IRRIG 1000ML POUR BTL (IV SOLUTION) ×2 IMPLANT
PACK ARTHROSCOPY KNEE (MISCELLANEOUS) ×2 IMPLANT
PACK EXTREMITY ARMC (MISCELLANEOUS) ×2 IMPLANT
PAD CAST 4YDX4 CTTN HI CHSV (CAST SUPPLIES) ×2 IMPLANT
PADDING CAST BLEND 4X4 STRL (MISCELLANEOUS) ×4 IMPLANT
PENCIL SMOKE EVACUATOR (MISCELLANEOUS) ×4 IMPLANT
RASP SM TEAR CROSS CUT (RASP) ×2 IMPLANT
RESECTOR FULL RADIUS 2.9 (ORTHOPEDIC DISPOSABLE SUPPLIES) IMPLANT
SPIKE FLUID TRANSFER (MISCELLANEOUS) ×2 IMPLANT
SPLINT CAST 1 STEP 4X15 (MISCELLANEOUS) ×2 IMPLANT
SPLINT CAST 1 STEP 4X30 (MISCELLANEOUS) IMPLANT
SPLINT CAST 1 STEP 5X30 WHT (MISCELLANEOUS) ×2 IMPLANT
SPONGE T-LAP 18X18 ~~LOC~~+RFID (SPONGE) ×2 IMPLANT
SPONGE T-LAP 4X18 ~~LOC~~+RFID (SPONGE) ×2 IMPLANT
STAPLER SKIN PROX 35W (STAPLE) ×2 IMPLANT
STOCKINETTE M/LG 89821 (MISCELLANEOUS) ×2 IMPLANT
STOCKINETTE ORTHO 6X25 (MISCELLANEOUS) ×2 IMPLANT
STRAP ANKLE FOOT DISTRACTOR (ORTHOPEDIC SUPPLIES) ×2 IMPLANT
STRAP SAFETY 5IN WIDE (MISCELLANEOUS) ×2 IMPLANT
SUCTION TUBE FRAZIER 10FR DISP (SUCTIONS) ×2 IMPLANT
SUT ETH BLK MONO 3 0 FS 1 12/B (SUTURE) ×2 IMPLANT
SUT ETHIBOND GREEN BRAID 0S 4 (SUTURE) ×8 IMPLANT
SUT ETHILON 3 0 PS 1 (SUTURE) IMPLANT
SUT ETHILON 3-0 (SUTURE) ×2 IMPLANT
SUT ETHILON NAB PS2 4-0 18IN (SUTURE) ×2 IMPLANT
SUT MON AB 3-0 SH27 (SUTURE) ×2 IMPLANT
SUT PDS AB 3-0 SH 27 (SUTURE) ×2 IMPLANT
SUT VIC AB 2-0 CT2 27 (SUTURE) IMPLANT
SUT VIC AB 3-0 SH 27X BRD (SUTURE) ×2 IMPLANT
SUT VIC AB 4-0 PS2 27 (SUTURE) ×2 IMPLANT
SUT VIC AB 4-0 SH 27XANBCTRL (SUTURE) ×2 IMPLANT
SUTURE ETHLN 4-0 FS2 18XMF BLK (SUTURE) ×2 IMPLANT
SUTURE MNCRL 4-0 27XMF (SUTURE) IMPLANT
SYR 20ML LL LF (SYRINGE) IMPLANT
SYR CONTROL 10ML LL (SYRINGE) ×2 IMPLANT
TOWEL OR 17X26 4PK STRL BLUE (TOWEL DISPOSABLE) ×2 IMPLANT
TRAP FLUID SMOKE EVACUATOR (MISCELLANEOUS) ×2 IMPLANT
TUBE SET DOUBLEFLO INFLOW (TUBING) ×2 IMPLANT
TUBE SET DOUBLEFLO OUTFLOW (TUBING) ×2 IMPLANT
TUBING CONNECTING 10 (TUBING) ×2 IMPLANT
WAND ARTHRO PARAGON T2 (SURGICAL WAND) IMPLANT
WAND COVAC 50 IFS (MISCELLANEOUS) IMPLANT
WAND TOPAZ MICRO DEBRIDER (MISCELLANEOUS) IMPLANT
WATER STERILE IRR 500ML POUR (IV SOLUTION) ×2 IMPLANT

## 2024-01-06 NOTE — Anesthesia Procedure Notes (Signed)
 Anesthesia Regional Block: Popliteal block   Pre-Anesthetic Checklist: , timeout performed,  Correct Patient, Correct Site, Correct Laterality,  Correct Procedure, Correct Position, site marked,  Risks and benefits discussed,  Surgical consent,  Pre-op evaluation,  At surgeon's request and post-op pain management  Laterality: Lower and Right  Prep: chloraprep       Needles:  Injection technique: Single-shot  Needle Type: Echogenic Needle     Needle Length: 9cm  Needle Gauge: 21     Additional Needles:   Procedures:,,,, ultrasound used (permanent image in chart),,    Narrative:  Start time: 01/06/2024 3:52 PM End time: 01/06/2024 3:53 PM Injection made incrementally with aspirations every 5 mL.  Performed by: Personally  Anesthesiologist: Chesley Lendia CROME, MD  Additional Notes: Patient's chart reviewed and they were deemed appropriate candidate for procedure, at surgeon's request. Patient educated about risks, benefits, and alternatives of the block including but not limited to: temporary or permanent nerve damage, bleeding, infection, damage to surround tissues, block failure, local anesthetic toxicity. Patient expressed understanding. A formal time-out was conducted consistent with institution rules.  Monitors were applied, and minimal sedation used. The site was prepped with skin prep and allowed to dry, and sterile gloves were used. A high frequency linear ultrasound probe with probe cover was utilized throughout. Popliteal artery pulsatile and visualized in popliteal fossa along with adjacent sciatic nerve and its branch point, which appeared anatomically normal, local anesthetic injected around them just proximal to the branch point, and echogenic block needle trajectory was monitored throughout. Aspiration performed every 5ml. Blood vessels were avoided. All injections were performed without resistance and free of blood and paresthesias. The patient tolerated the procedure  well.  Injectate: 10cc 0.5% bupivacaine  + 10cc Exparel 

## 2024-01-06 NOTE — Brief Op Note (Signed)
 01/06/2024  6:44 PM  PATIENT:  Aaron Kennedy  53 y.o. male  PRE-OPERATIVE DIAGNOSIS:  Right ankle instability  Osteochondral defect  Peroneal tendinitis of right lower extremity  Posterior tibial tendinitis of right leg  POST-OPERATIVE DIAGNOSIS:  Right ankle instability Osteochondral defect Peroneal tendinitis of right lower extremity Posterior tibial tendinitis of right leg  PROCEDURE:  Procedure(s) with comments: ARTHROSCOPY, ANKLE, WITH MICROFRACTURE (Right) REPAIR, LIGAMENT (Right) - GEN WITH POP BLOCK  SURGEON:  Surgeons and Role:    * Ferron Ishmael, Juliene SAUNDERS, DPM - Primary  PHYSICIAN ASSISTANT:   ASSISTANTS: none   ANESTHESIA:   regional and general  EBL:  50cc  BLOOD ADMINISTERED:none  DRAINS: none   LOCAL MEDICATIONS USED:  MARCAINE    7cc  SPECIMEN:  No Specimen  DISPOSITION OF SPECIMEN:  N/A  COUNTS:  YES  TOURNIQUET:   Total Tourniquet Time Documented: Thigh (Right) - 114 minutes Total: Thigh (Right) - 114 minutes   DICTATION: .Note written in EPIC  PLAN OF CARE: Discharge to home after PACU  PATIENT DISPOSITION:  PACU - hemodynamically stable.   Delay start of Pharmacological VTE agent (>24hrs) due to surgical blood loss or risk of bleeding: no

## 2024-01-06 NOTE — Anesthesia Procedure Notes (Signed)
 Anesthesia Regional Block: Adductor canal block   Pre-Anesthetic Checklist: , timeout performed,  Correct Patient, Correct Site, Correct Laterality,  Correct Procedure, Correct Position, site marked,  Risks and benefits discussed,  Surgical consent,  Pre-op evaluation,  At surgeon's request and post-op pain management  Laterality: Lower and Right  Prep: chloraprep       Needles:  Injection technique: Single-shot  Needle Type: Echogenic Needle     Needle Length: 9cm  Needle Gauge: 21     Additional Needles:   Procedures:,,,, ultrasound used (permanent image in chart),,    Narrative:  Start time: 01/06/2024 3:47 PM End time: 01/06/2024 3:49 PM Injection made incrementally with aspirations every 5 mL.  Performed by: Personally  Anesthesiologist: Chesley Lendia CROME, MD  Additional Notes: Patient's chart reviewed and they were deemed appropriate candidate for procedure, per surgeon's request. Patient educated about risks, benefits, and alternatives of the block including but not limited to: temporary or permanent nerve damage, bleeding, infection, damage to surround tissues, block failure, local anesthetic toxicity. Patient expressed understanding. A formal time-out was conducted consistent with institution rules.  Monitors were applied, and minimal sedation used (see nursing record). The site was prepped with skin prep and allowed to dry, and sterile gloves were used. A high frequency linear ultrasound probe with probe cover was utilized throughout. Femoral artery visualized at mid-thigh level, local anesthetic injected anterolateral to it, and echogenic block needle trajectory was monitored throughout. Hydrodissection of saphenous nerve visualized and appeared anatomically normal. Aspiration performed every 5ml. Blood vessels were avoided. All injections were performed without resistance and free of blood and paresthesias. The patient tolerated the procedure well.  Injectate: 10cc 0.5%  bupivacaine  + 10cc Exparel 

## 2024-01-06 NOTE — Anesthesia Postprocedure Evaluation (Signed)
 Anesthesia Post Note  Patient: Aaron Kennedy Medicine  Procedure(s) Performed: ARTHROSCOPY, ANKLE, WITH MICROFRACTURE (Right: Ankle) REPAIR, LIGAMENT (Right)  Patient location during evaluation: PACU Anesthesia Type: General and Regional Level of consciousness: awake and alert Pain management: pain level controlled Vital Signs Assessment: post-procedure vital signs reviewed and stable Respiratory status: spontaneous breathing, nonlabored ventilation, respiratory function stable and patient connected to nasal cannula oxygen Cardiovascular status: blood pressure returned to baseline and stable Postop Assessment: no apparent nausea or vomiting Anesthetic complications: no   No notable events documented.   Last Vitals:  Vitals:   01/06/24 1930 01/06/24 1931  BP: (!) 150/79 (!) 142/64  Pulse: 78 82  Resp: 14 14  Temp:    SpO2: 94% 94%    Last Pain:  Vitals:   01/06/24 1930  TempSrc:   PainSc: 5                  Aaron Kennedy

## 2024-01-06 NOTE — Interval H&P Note (Signed)
 History and Physical Interval Note:  01/06/2024 3:36 PM  Aaron Kennedy  has presented today for surgery, with the diagnosis of Right ankle instability  Osteochondral defect  Peroneal tendinitis of right lower extremity  Posterior tibial tendinitis of right leg.  The various methods of treatment have been discussed with the patient and family. After consideration of risks, benefits and other options for treatment, the patient has consented to  Procedure(s) with comments: ARTHROSCOPY, ANKLE, WITH MICROFRACTURE (Right) REPAIR, LIGAMENT (Right) - GEN WITH POP BLOCK as a surgical intervention.  The patient's history has been reviewed, patient examined, no change in status, stable for surgery.  I have reviewed the patient's chart and labs.  Questions were answered to the patient's satisfaction.     Juliene JONELLE Medicine

## 2024-01-06 NOTE — Discharge Instructions (Addendum)
 Post-Surgery Instructions  1. If you are recuperating from surgery anywhere other than home, please be sure to leave us  a number where you can be reached. 2. Go directly home and rest. 3. The keep operated foot (or feet) elevated six inches above the hip when sitting or lying down. 4. Support the elevated foot and leg with pillows under the calf. DO NOT PLACE PILLOWS UNDER THE KNEE. 5. DO NOT REMOVE or get your bandages wet. This will increase your chances of getting an infection. 6. Wear your surgical splint at all times when you are up. 7. A limited amount of pain and swelling may occur. The skin may take on a bruised appearance. This is no cause for alarm. 8. For slight pain and swelling, apply an ice pack directly behind the knee for 15 minutes every hour. Continue icing until seen in the office. DO NOT apply any form of heat to the area. 9. Have prescription(s) filled immediately and take as directed. 10. Drink lots of liquids, water , and juice. 11. CALL THE OFFICE IMMEDIATELY IF: a. Bleeding continues b. Pain increases and/or does not respond to medication c. Bandage or cast appears too tight d. Any liquids (water , coffee, etc.) have spilled on your bandages. e. Tripping, falling, or stubbing the surgical foot f. If your temperature rises above 101 g. If you have ANY questions at all 12. Please use the crutches, knee scooter, or walker you have prescribed, rented, or purchased. If you are non-weight bearing DO NOT put weight on the operated foot for _________ days. If you are weight-bearing, follow your physician's instructions. You are expected to be:   ? non-weight bearing 13. Special Instructions: _____________________________________________________________ _________________________________________________________________________________ _________________________________________________________________________________  14. Your next appointment is: 01/12/2024 9:30 AM with  Dr Lamount  If you need to reach the nurse for any reason, please call: [place]: [phone] [place]: [phone] [place]: [phone]

## 2024-01-06 NOTE — Anesthesia Preprocedure Evaluation (Signed)
 Anesthesia Evaluation  Patient identified by MRN, date of birth, ID band Patient awake    Reviewed: Allergy & Precautions, H&P , NPO status , Patient's Chart, lab work & pertinent test results, reviewed documented beta blocker date and time   Airway Mallampati: II  TM Distance: >3 FB Neck ROM: full    Dental  (+) Dental Advidsory Given, Missing   Pulmonary neg shortness of breath, neg sleep apnea, neg COPD, neg recent URI, Current Smoker and Patient abstained from smoking.   Pulmonary exam normal breath sounds clear to auscultation       Cardiovascular Exercise Tolerance: Good hypertension, (-) angina + Peripheral Vascular Disease  (-) Cardiac Stents Normal cardiovascular exam(-) dysrhythmias (-) Valvular Problems/Murmurs Rhythm:regular Rate:Normal     Neuro/Psych Seizures -, Well Controlled,  PSYCHIATRIC DISORDERS Anxiety     CVA (right side about 60%), Residual Symptoms    GI/Hepatic Neg liver ROS,GERD  Controlled,,  Endo/Other  diabetes, Well Controlled, Type 2    Renal/GU negative Renal ROS  negative genitourinary   Musculoskeletal   Abdominal   Peds  Hematology negative hematology ROS (+)   Anesthesia Other Findings Past Medical History: No date: Ankle instability, right No date: Anxiety No date: Arthritis No date: Chronic pain of right knee 08/26/2021: DM type 2, controlled, with complication (HCC) No date: Hypertension 01/2019: Insomnia No date: Osteochondral defect of talus No date: PAD (peripheral artery disease) (HCC) No date: Peroneal tendinitis, right No date: Posterior tibial tendonitis, right No date: Stroke [facility])     Comment:  right side weakness   Reproductive/Obstetrics negative OB ROS                              Anesthesia Physical Anesthesia Plan  ASA: 3  Anesthesia Plan: General   Post-op Pain Management: Regional block*   Induction: Intravenous  PONV  Risk Score and Plan: 1 and Ondansetron , Dexamethasone , Midazolam  and Treatment may vary due to age or medical condition  Airway Management Planned: Oral ETT and LMA  Additional Equipment:   Intra-op Plan:   Post-operative Plan: Extubation in OR  Informed Consent: I have reviewed the patients History and Physical, chart, labs and discussed the procedure including the risks, benefits and alternatives for the proposed anesthesia with the patient or authorized representative who has indicated his/her understanding and acceptance.     Dental Advisory Given  Plan Discussed with: Anesthesiologist, CRNA and Surgeon  Anesthesia Plan Comments:          Anesthesia Quick Evaluation

## 2024-01-06 NOTE — Anesthesia Procedure Notes (Signed)
 Procedure Name: LMA Insertion Date/Time: 01/06/2024 4:12 PM  Performed by: Lorriane Arabia, CRNAPre-anesthesia Checklist: Patient identified, Patient being monitored, Timeout performed, Emergency Drugs available and Suction available Patient Re-evaluated:Patient Re-evaluated prior to induction Oxygen Delivery Method: Circle system utilized Preoxygenation: Pre-oxygenation with 100% oxygen Induction Type: IV induction Ventilation: Mask ventilation without difficulty LMA: LMA inserted LMA Size: 5.0 Tube type: Oral Number of attempts: 1 Placement Confirmation: positive ETCO2 and breath sounds checked- equal and bilateral Tube secured with: Tape Dental Injury: Teeth and Oropharynx as per pre-operative assessment

## 2024-01-07 DIAGNOSIS — S8251XA Displaced fracture of medial malleolus of right tibia, initial encounter for closed fracture: Secondary | ICD-10-CM

## 2024-01-07 DIAGNOSIS — M25371 Other instability, right ankle: Secondary | ICD-10-CM

## 2024-01-07 DIAGNOSIS — M65969 Unspecified synovitis and tenosynovitis, unspecified lower leg: Secondary | ICD-10-CM

## 2024-01-07 DIAGNOSIS — S93421A Sprain of deltoid ligament of right ankle, initial encounter: Secondary | ICD-10-CM

## 2024-01-07 DIAGNOSIS — M67471 Ganglion, right ankle and foot: Secondary | ICD-10-CM

## 2024-01-07 DIAGNOSIS — M958 Other specified acquired deformities of musculoskeletal system: Secondary | ICD-10-CM

## 2024-01-07 NOTE — Op Note (Addendum)
 Patient Name: Aaron Kennedy DOB: 11-02-1970  MRN: [id number]   Date of Service: 01/06/2024  Surgeon: Dr. Juliene Medicine, DPM Assistants: None Pre-operative Diagnosis:  Right ankle instability  Osteochondral defect  Peroneal tendinitis of right lower extremity  Posterior tibial tendinitis of right leg Post-operative Diagnosis:  Right ankle instability  Osteochondral defect Loose body of ankle joint Ganglion cyst right ankle Tenosynovitis posterior tibial tendon Nonunion of mid malleolar fracture  Procedures: Ankle arthroscopy with treatment of osteochondral defect and excision of loose body Secondary repair of anterior talofibular ligament with modified Brostrm Gould and internal brace augmentation Partial excision distal tibia medial malleolar fracture nonunion Second repair of deltoid ligament Tenolysis posterior tibial tendon Excision ganglion cyst lateral ankle Pathology/Specimens: * No specimens in log * Anesthesia: General With regional block Hemostasis:  Total Tourniquet Time Documented: Thigh (Right) - 114 minutes Total: Thigh (Right) - 114 minutes  Estimated Blood Loss: 50 cc Materials:  Implant Name Type Inv. Item Serial No. Manufacturer Lot No. LRB No. Used Action  KIT FIBERTAK DX KNTLS INSTBLT - [id number] Anchor KIT FIBERTAK DX KNTLS INSTBLT  [employer] [id number] Right 1 Implanted  IMPL INTERNAL BRACE BIO - [id number] Anchor IMPL INTERNAL BRACE BIO  [employer] [id number] Right 1 Implanted  ANCHOR KNOTLESS SUT DX #2 - [id number] Anchor ANCHOR KNOTLESS SUT DX #2  [employer] [id number] Right 1 Implanted  ANCHOR KNOTLESS SUT DX #2 - [id number] Anchor ANCHOR KNOTLESS SUT DX #2  [employer] [id number] Right 1 Implanted   Medications: 7 cc 0.5% Marcaine  plain Complications: No complications noted  Indications for Procedure:  This is a 53 y.o. male with a history of previous ankle injury chronic ankle instability and chronic ankle pain.  He failed nonoperative  treatment and elected for operative intervention. All risks, benefits and potential complications discussed prior to the procedure. All questions addressed. Informed consent signed and reviewed.      Procedure in Detail: After identifying the patient in the pre-operative holding area and marking the right lower extremity as the correct side and site of surgery, and undergoing right  lower extremity regional block by the anesthesia team, the patient was brought to the operating room and transferred from the stretcher to the operating room table and placed in the supine position with an ipsilateral hip bump.  A safety belt was placed and secured around his waist.  A timeout was then performed.  General anesthesia was then induced.  The right lower extremity was then prepped and draped in the usual sterile fashion.  The right  lower extremity was elevated, exsanguinated and the pneumatic thigh tourniquet was inflated to 325 mmHg.  A medial portal adjacent to the TA tendon was made. The blunt obturator was used to enter the capsule. The camera was introduced and the joint evaluated. There was a large amount of anterior impinging structures including atrophic and hemorrhagic synovitis with capsular fibrillations. The camera was passed lateral and a lateral portal made. The 2.90mm shaver was introduced. I began by extensively debriding all synovitis and impinging structures. This was completed with exchange of instrumentation in the medial and lateral portals to evaluate the medial joint.  A complete examination revealed that the syndesmosis was stable.  He had a complete tear of the ATFL with lateral instability.  In the medial gutter there was a loose body that was extracted with a grasping tool.  Visualization of the deltoid ligament showed incompetence with complete tearing there was thinning of the distal tibial articular cartilage  on the medial portion.  Laterally there was a loose wafer of cartilage on the  lateral shoulder consistent with an osteochondral defect approximately 7 mm in diameter.  The osteochondral defect was excised with a shaver and curette.  There was still a layer of cartilage deep to this and no cystic formation on his preoperative imaging and probing showed good stability after removal of the delaminated cartilage.  Due to the location I elected not to microfracture so as not to destabilize the lesion any further on the lateral shoulder.  I then completed my examination and instrumentation was removed.  I then made an incision from the posterior fibula to the sinus tarsi in a curvilinear fashion. This was carried deep through subcutaneous tissue, cauterizing bleeding vessels as needed. The deep fascia was incised and a soft tissue cuff was raised from the distal fibula.  The peroneal tendons appeared to be healthy there was a fusiform ganglion cyst overlying the tendon sheath measuring approximately 6 cm in length and 1 cm in diameter.  I isolated dissected and excised the lesion and appeared to have complete resection.  I then returned to the distal fibula.  2 separate FiberTak DX knotless anchors were placed and passed through the distal capsule and retinaculum in a mattress style suture. The foot was held in neutral position and slight eversion and the sutures passed through the anchors to complete the modified Brostrom procedure. Some residual laxity was present due to the degenerated nature of the tissues. I then elected to proceed with the Internal Brace augmentation. A small incision was made on the anterior talar shoulder. A guidewire was placed and evaluated with fluroscopy for position to ensure it did not violate the joints. The anchor hole was then drilled, tapped, and the 4.49mm SwivelLock with fibertape was inserted with good purchase. Both limbs of the suture were then passed through the eyelet of the 3.67mm SwivelLock anchor. This was then placed proximal and superior to the  [place] anchor sites so as not to disrupt the [employer]. The foot was then again held in the appropriate position and the tension was evaluated so as not to overtighten the joint. The anchor was inserted with good purchase of the suture limbs. The stress examination was then repeated and was stable on the lateral side but he still had significant laxity in frontal plane Eversion and medial anterior drawer instability.  I then directed my attention medially and a curvilinear incision from the posterior tibia to the anterior talar shoulder was completed.  I carried dissection deep to subcutaneous tissues cauterizing bleeding vessels as necessary.  The deep fascia was incised and the posterior tibial tendon was examined a significant mount of tenosynovitis was present with dark yellow inflammatory fluid emanating from the tendon sheath.  I freed the tendon from its adhesive attachments with blunt dissection.  It was irrigated thoroughly.  There were no notable tears that require direct repair.  I then directed my attention anterior to the portions of the incision and raised the cuff of tissue along the anterior colliculus.  This revealed that the anterior colliculus was a nonunited fracture and this was isolated and excised in multiple pieces.  Radiographs confirmed complete resection.  This required additional dissection deeper and more posterior than typically required for deltoid repair alone. Once this was complete there was notable disruption of the superior deltoid and complete tearing of the inferior portions of the deep deltoid.  2 fiber tack DX knotless anchors were then placed  into the distal tibia inferiorly and anteriorly at the site of the previous collicular fracture.  The deep fascia retinaculum and portions of the deep and superficial deltoid for purchase with the anchor sutures in a mattress style fashion and the foot was held in neutral position and the knotless mechanism was used to repair  the ligament.  I then repeated my stress examination of all ligaments with external rotation coronal plane E version and inversion and anterior drawer.  Once this was completed the ankle appeared to be stable.  Final films were taken.  All incisions were thoroughly irrigated.  Layered closure was then completed with 2-0 Mersilene to repair the remaining retinaculum and provide additional stability on both stabilization sites, deep fascia was repaired with 2-0 Vicryl 3-0 Vicryl, subcutaneous tissue with 4-0 Monocryl, and skin with 4-0 nylon and skin staples.   A postop dressing was applied consisting of Xeroform, sterile gauze, 4 x 4, and Kerlix and an Ace wrap.  The patient tolerated the procedure well.  The tourniquet was then deflated.  Immediate capillary refill was noted to all toes.  He was aroused from sedation and transferred back to the stretcher and to the post anesthesia care unit for further monitoring.  He was discharged to home in good condition, and He will be nonweightbearing as tolerated in a well-padded below-knee splint. He will follow up with me in 1 week for postop evaluation.     Disposition: Following a period of post-operative monitoring, patient will be transferred to home.

## 2024-01-09 ENCOUNTER — Encounter: Payer: Self-pay | Admitting: Podiatry

## 2024-01-09 ENCOUNTER — Telehealth: Payer: Self-pay | Admitting: Orthopedic Surgery

## 2024-01-09 NOTE — Telephone Encounter (Signed)
 I called to see what they need for him They received an order for AFO already/ They sent over paperwork to be signed, and need that back

## 2024-01-09 NOTE — Transfer of Care (Signed)
 Immediate Anesthesia Transfer of Care Note  Patient: Aaron Kennedy  Procedure(s) Performed: ARTHROSCOPY, ANKLE, WITH MICROFRACTURE (Right: Ankle) REPAIR, LIGAMENT (Right)  Patient Location: PACU  Anesthesia Type:General  Level of Consciousness: drowsy and patient cooperative  Airway & Oxygen Therapy: Patient Spontanous Breathing and Patient connected to nasal cannula oxygen  Post-op Assessment: Report given to RN and Post -op Vital signs reviewed and stable  Post vital signs: Reviewed and stable  Last Vitals:  Vitals Value Taken Time  BP 142/64 01/06/24 19:31  Temp 36.7 C 01/06/24 19:28  Pulse 80 01/06/24 19:31  Resp 18 01/06/24 19:31  SpO2 95 % 01/06/24 19:31  Vitals shown include unfiled device data.  Last Pain:  Vitals:   01/06/24 1930  TempSrc:   PainSc: 5          Complications: No notable events documented.

## 2024-01-09 NOTE — Telephone Encounter (Signed)
 Dr. Areatha pt - Alan w/[facility] lvm looking for a standard written order for this patient [phone]

## 2024-01-10 ENCOUNTER — Encounter: Payer: Self-pay | Admitting: Podiatry

## 2024-01-12 ENCOUNTER — Encounter: Payer: Self-pay | Admitting: Podiatry

## 2024-01-12 ENCOUNTER — Other Ambulatory Visit: Payer: Self-pay

## 2024-01-12 ENCOUNTER — Ambulatory Visit (INDEPENDENT_AMBULATORY_CARE_PROVIDER_SITE_OTHER)

## 2024-01-12 ENCOUNTER — Ambulatory Visit (INDEPENDENT_AMBULATORY_CARE_PROVIDER_SITE_OTHER): Admitting: Podiatry

## 2024-01-12 DIAGNOSIS — M958 Other specified acquired deformities of musculoskeletal system: Secondary | ICD-10-CM

## 2024-01-12 DIAGNOSIS — M25371 Other instability, right ankle: Secondary | ICD-10-CM

## 2024-01-12 DIAGNOSIS — M7671 Peroneal tendinitis, right leg: Secondary | ICD-10-CM

## 2024-01-12 MED ORDER — OXYCODONE HCL 5 MG PO TABS
5.0000 mg | ORAL_TABLET | Freq: Four times a day (QID) | ORAL | 0 refills | Status: DC | PRN
  Filled 2024-01-12: qty 20, 5d supply, fill #0

## 2024-01-12 MED ORDER — ACETAMINOPHEN 500 MG PO TABS
1000.0000 mg | ORAL_TABLET | Freq: Four times a day (QID) | ORAL | 0 refills | Status: DC | PRN
  Filled 2024-01-12: qty 100, 13d supply, fill #0

## 2024-01-12 MED ORDER — ACETAMINOPHEN 500 MG PO TABS
1000.0000 mg | ORAL_TABLET | Freq: Three times a day (TID) | ORAL | 0 refills | Status: AC | PRN
  Filled 2024-01-12: qty 84, 14d supply, fill #0

## 2024-01-12 NOTE — Progress Notes (Signed)
 Subjective:  Patient ID: Aaron Kennedy Medicine, male    DOB: 12-Mar-1971,  MRN: [id number]  Chief Complaint  Patient presents with   Routine Post Op    pov # 1 dos 01/06/24 rt ankle arthroplasty, ligament repairs, tendon repairs with PRP injection given at time of surgery( Diers pt) Pt pain 9-10 when down.     DOS: 01/06/2024 Procedure: Dr. Medicine patient 1.  Right ankle arthroscopy with treatment of OCD of talus and excision of loose body 2.  Secondary ATFL repair with modified Brostrm and internal brace 3.  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  Secondary repair of right ankle deltoid ligament 5.  Tenolysis of right posterior tibial tendon 6.  Excision of ganglion cyst right lateral ankle  53 y.o. male returns for post-op check.  He comes in for above-stated procedures.  Reports doing pretty well.  Does report some persistent soreness to the area within postoperative limits.  He has been nonweightbearing in posterior splint with crutches.  Review of Systems: Negative except as noted in the HPI. Denies N/V/F/Ch.  Past Medical History:  Diagnosis Date   Ankle instability, right    Anxiety    Arthritis    Chronic pain of right knee    DM type 2, controlled, with complication (HCC) 08/26/2021   Hypertension    Insomnia 01/2019   Osteochondral defect of talus    PAD (peripheral artery disease)    Peroneal tendinitis, right    Posterior tibial tendonitis, right    Stroke [facility])    right side weakness    Current Outpatient Medications:    Accu-Chek Softclix Lancets lancets, Check up to twice daily., Disp: 100 each, Rfl: 3   atorvastatin  (LIPITOR) 80 MG tablet, Take 1 tablet (80 mg total) by mouth daily. (Patient taking differently: Take 80 mg by mouth every evening.), Disp: 90 tablet, Rfl: 3   Blood Glucose Monitoring Suppl (BLOOD GLUCOSE MONITOR SYSTEM) w/Device KIT, Use as directed up to twice daily., Disp: 1 kit, Rfl: 0   clopidogrel  (PLAVIX )  75 MG tablet, Take 1 tablet (75 mg total) by mouth daily. (Patient taking differently: Take 75 mg by mouth every other day.), Disp: 90 tablet, Rfl: 3   cyclobenzaprine  (FLEXERIL ) 10 MG tablet, Take 1 tablet (10 mg total) by mouth 3 (three) times daily as needed for muscle spasms., Disp: 30 tablet, Rfl: 0   Evolocumab  (REPATHA  SURECLICK) 140 MG/ML SOAJ, Inject 140 mg into the skin every 14 (fourteen) days., Disp: 6 mL, Rfl: 3   gabapentin  (NEURONTIN ) 600 MG tablet, Take 1 tablet (600 mg total) by mouth 2 (two) times daily., Disp: 60 tablet, Rfl: 2   Glucose Blood (BLOOD GLUCOSE TEST STRIPS) STRP, Check up to twice daily., Disp: 100 strip, Rfl: 3   Lancet Device MISC, Check up to twice daily. May substitute to any manufacturer covered by patient's insurance., Disp: 1 each, Rfl: 0   lisinopril -hydrochlorothiazide  (ZESTORETIC ) 10-12.5 MG tablet, TAKE 1 TABLET BY MOUTH DAILY., Disp: 90 tablet, Rfl: 3   metFORMIN  (GLUCOPHAGE -XR) 500 MG 24 hr tablet, Take 2 tablets (1,000 mg total) by mouth daily with breakfast. (Patient taking differently: Take 500 mg by mouth in the morning and at bedtime.), Disp: 180 tablet, Rfl: 1   metoprolol  succinate (TOPROL -XL) 25 MG 24 hr tablet, Take 1 tablet (25 mg total) by mouth daily., Disp: 90 tablet, Rfl: 3   NON FORMULARY, Take 2 mg by mouth at bedtime. Klonopin 1 mg, Disp: , Rfl:  Semaglutide , 1 MG/DOSE, 4 MG/3ML SOPN, Inject 1 mg as directed once a week. (Patient taking differently: Inject 1 mg as directed every Thursday.), Disp: 3 mL, Rfl: 6   tetrahydrozoline 0.05 % ophthalmic solution, Place 1-2 drops into both eyes daily as needed (eye irritation.)., Disp: , Rfl:    Vitamin D , Ergocalciferol , (DRISDOL ) 1.25 MG (50000 UNIT) CAPS capsule, Take 1 capsule (50,000 Units total) by mouth every 7 (seven) days., Disp: 5 capsule, Rfl: 2   acetaminophen  (TYLENOL ) 500 MG tablet, Take 2 tablets (1,000 mg total) by mouth every 6 (six) hours as needed for up to 14 days., Disp: 112  tablet, Rfl: 0   oxyCODONE  (OXY IR/ROXICODONE ) 5 MG immediate release tablet, Take 1 tablet (5 mg total) by mouth every 6 (six) hours as needed for up to 20 doses for severe pain (pain score 7-10)., Disp: 20 tablet, Rfl: 0  Social History   Tobacco Use  Smoking Status Every Day   Current packs/day: 0.50   Average packs/day: 0.5 packs/day for 20.0 years (10.0 ttl pk-yrs)   Types: Cigarettes  Smokeless Tobacco Never    No Known Allergies Objective:  There were no vitals filed for this visit. There is no height or weight on file to calculate BMI. Constitutional Well developed. Well nourished.  Vascular Foot warm and well perfused. Capillary refill normal to all digits.   Neurologic Normal speech. Oriented to person, place, and time. Epicritic sensation to light touch grossly present bilaterally.  Dermatologic Sutures and staples are intact to right ankle.  Skin healing well without signs of infection. Skin edges well coapted without signs of infection.  Orthopedic: Mild tenderness to palpation noted about the surgical site.   Radiographs: Right ankle radiographs 01/12/2024 Ankle mortise alignment is maintained.  Evidence of partial medial malleolar nonunion excision noted. Assessment:   1. Osteochondral defect of talus   2. Ankle instability, right    Plan:  Patient was evaluated and treated and all questions answered.  S/p foot surgery right ankle arthroscopy with loose body removal and OCD repair, deltoid and ATFL repair, medial malleolar nonunion excision, PT tendon tenolysis and lateral ankle ganglion excision -Progressing as expected post-operatively. -XR: Reviewed with patient -WB Status: Nonweightbearing in cam boot -May begin some gentle range of motion exercises outside of the boot -Sutures: Left intact, likely removal at follow-up visit. -Medications: Refill of 5 mg oxycodone  every 6 hours as needed for moderate to severe pain 20 tablets and Tylenol  1000 mg every 8  hours as needed sent to patient's pharmacy.  Can continue gabapentin  -Foot redressed. - Follow-up appointment scheduled for possible suture removal on 10/9.  After 1 week he may begin to get foot wet in shower and gently dry incisions off following this.  Return for Post Op Suture Removal.

## 2024-01-17 ENCOUNTER — Other Ambulatory Visit: Payer: Self-pay

## 2024-01-17 ENCOUNTER — Other Ambulatory Visit: Payer: Self-pay | Admitting: Nurse Practitioner

## 2024-01-17 ENCOUNTER — Telehealth: Payer: Self-pay | Admitting: Podiatry

## 2024-01-17 ENCOUNTER — Other Ambulatory Visit (HOSPITAL_COMMUNITY): Payer: Self-pay

## 2024-01-17 DIAGNOSIS — M958 Other specified acquired deformities of musculoskeletal system: Secondary | ICD-10-CM

## 2024-01-17 MED ORDER — OXYCODONE HCL 5 MG PO TABS
5.0000 mg | ORAL_TABLET | Freq: Four times a day (QID) | ORAL | 0 refills | Status: DC | PRN
  Filled 2024-01-17 (×2): qty 20, 5d supply, fill #0

## 2024-01-17 NOTE — Telephone Encounter (Signed)
 Patient called requesting a refill of oxycodone . His preferred pharmacy is the [facility].

## 2024-01-20 ENCOUNTER — Telehealth: Payer: Self-pay | Admitting: Orthopedic Surgery

## 2024-01-20 NOTE — Telephone Encounter (Signed)
 Dr. Areatha pt GLENWOOD Nakayama w/[facility] p#[phone]/F#[phone] lvm today at 1:32pm stating she is needing the standard written order for this patient.

## 2024-01-23 ENCOUNTER — Telehealth: Payer: Self-pay | Admitting: Lab

## 2024-01-23 ENCOUNTER — Other Ambulatory Visit: Payer: Self-pay

## 2024-01-23 ENCOUNTER — Other Ambulatory Visit: Payer: Self-pay | Admitting: Podiatry

## 2024-01-23 DIAGNOSIS — M958 Other specified acquired deformities of musculoskeletal system: Secondary | ICD-10-CM

## 2024-01-23 MED ORDER — OXYCODONE HCL 5 MG PO TABS
5.0000 mg | ORAL_TABLET | Freq: Four times a day (QID) | ORAL | 0 refills | Status: DC | PRN
  Filled 2024-01-23 (×2): qty 20, 5d supply, fill #0

## 2024-01-23 NOTE — Telephone Encounter (Signed)
 Patient aware

## 2024-01-23 NOTE — Telephone Encounter (Signed)
Patient is calling for refill on pain medication

## 2024-01-26 ENCOUNTER — Other Ambulatory Visit: Payer: Self-pay

## 2024-01-26 ENCOUNTER — Ambulatory Visit (INDEPENDENT_AMBULATORY_CARE_PROVIDER_SITE_OTHER): Admitting: Podiatry

## 2024-01-26 VITALS — BP 127/87 | HR 93 | Temp 97.2°F

## 2024-01-26 DIAGNOSIS — Z9889 Other specified postprocedural states: Secondary | ICD-10-CM

## 2024-01-26 DIAGNOSIS — M25371 Other instability, right ankle: Secondary | ICD-10-CM

## 2024-01-26 MED ORDER — CIPROFLOXACIN HCL 500 MG PO TABS
500.0000 mg | ORAL_TABLET | Freq: Two times a day (BID) | ORAL | 0 refills | Status: AC
Start: 2024-01-26 — End: 2024-02-05
  Filled 2024-01-26: qty 20, 10d supply, fill #0

## 2024-01-26 MED ORDER — DOXYCYCLINE HYCLATE 100 MG PO TABS
100.0000 mg | ORAL_TABLET | Freq: Two times a day (BID) | ORAL | 0 refills | Status: AC
  Filled 2024-01-26: qty 20, 10d supply, fill #0

## 2024-01-26 NOTE — Progress Notes (Signed)
 Patient presents for post-op visit today, pov # 2 dos 01/06/24 rt ankle arthroplasty, ligament repairs, tendon repairs with PRP injection given at time of surgery  Last night took bandage off because it was itching. Lots of dry skin. Put neosporin just on the dry skin. Looks to be healing up real good. The pain has subsided somewhat, the burning sensations have dulled mostly. Spasms have stopped..   Vital Signs: Today's Vitals   01/26/24 0908  BP: (!) 145/95  Pulse: 93  Temp: (!) 97.2 F (36.2 C)  TempSrc: Oral  PainSc: 8   PainLoc: Ankle  127/87 @ 0947  Radiographs: []  Taken [x]  Not taken  Surgical Site Assessment:  - Dressing:  []  Minimal dry blood, intact []  Reinforced   [x]  Changed   -RN Notes: dry and intact. Yellow-green drainage present on roll gauze when removed. Xeroform adhered to medial proximal incision where some drainage is present. No active drainage from incision.   - Incision:  []  CDI (clean, dry, intact)  [x]  Mild erythema  [x]  Drainage noted   -RN Notes: Medial and lateral incision has erythema.  - Swelling:  []  None  [x]  Mild  []  Moderate   []  Significant    - Bruising:  [x]  None  []  Present:   - Sutures/staples:  [x]  Intact  [x]  Sutures Removed Today  [x]  Plan to remove remaining staples at next visit    -Cast/Splint/Pins: [x]  None []  Intact  []  Removed []  Plan to remove at next visit []  Replaced  -Signs of infection:  []  None  [x]  Present - Describe: redness, warmth, drainage on dressing, not actively from incision. All noted above.   -DME:    [x]  AFW []  Surgical shoe []  Cast  []  Splint  -Walking status:  []  Full WB  [x]  Partial WB  []  NWB  -Utilizing device:  []  None []  Knee Scooter [x]  Crutches []  Wheelchair    DVT assessment:  [x]  Denies symptoms []  Chest pain/SOB []  Pain in calf/redness/warmth   Redressed DSD and ace wrap. Educated on signs of infection, proper dressing care, pain management, and weight bearing status. Patient will contact  provider with any new or worsening symptoms. The provider assessed the patient today and reviewed instructions regarding plan of care.       I saw this patient in conjunction today with Ileana Ka, RN.  He had some yellowish-green drainage in the bandage but none from his incision.  Mild erythema as well.  There was no evidence of dehiscence.  I did recommend leaving the staples in for 1 more week he will follow-up with us  for staple removal.  Rx for broad-spectrum antibiotic coverage, the green discoloration does not meet some concern for pseudomonal infection he has multiple risk factors for infectious processes.  Rx sent to pharmacy.  Continue utilize crutches.  Should be able to start weightbearing after next visit next week.  Physical therapy referral sent to outpatient rehab in [place].  Advised to contact us  if he develops fever chills nausea or vomiting.  Juliene Medicine, DPM 01/26/2024

## 2024-01-26 NOTE — Patient Instructions (Signed)

## 2024-01-30 ENCOUNTER — Other Ambulatory Visit: Payer: Self-pay | Admitting: Podiatry

## 2024-01-30 ENCOUNTER — Other Ambulatory Visit: Payer: Self-pay

## 2024-01-30 DIAGNOSIS — M958 Other specified acquired deformities of musculoskeletal system: Secondary | ICD-10-CM

## 2024-01-30 MED ORDER — OXYCODONE HCL 5 MG PO TABS
5.0000 mg | ORAL_TABLET | Freq: Four times a day (QID) | ORAL | 0 refills | Status: DC | PRN
  Filled 2024-01-30: qty 20, 5d supply, fill #0

## 2024-01-30 NOTE — Telephone Encounter (Signed)
 Refill was sent in last Monday. He is currently out of the medication. He reports possibly not needing much after this one refill. His pain level currently is a 7/10, then increases to 10/10 with weight-bearing. Has also had burning in the foot as well. Has been taking Tylenol  with little relief as well as ice and elevation. Sending message to on-call provider due to Dr. Silva being out of office.

## 2024-01-30 NOTE — Telephone Encounter (Signed)
 Patient called to confirm that pharmacy sent request for oxycodone .I informed him that it had.He is in [place] and is waiting in the area so he can pick up before heading back to [place]. He mentioned he thinks the antibiotic is working because the areas where he had infection is itching. The ankle is still burning on the sides and top but pain is getting better.

## 2024-01-30 NOTE — Progress Notes (Signed)
 PRN postop

## 2024-01-31 ENCOUNTER — Other Ambulatory Visit: Payer: Self-pay

## 2024-01-31 NOTE — Telephone Encounter (Signed)
Not a problem at all. 

## 2024-01-31 NOTE — Telephone Encounter (Signed)
Patient informed of refill sent

## 2024-02-02 ENCOUNTER — Ambulatory Visit: Admitting: Podiatry

## 2024-02-02 DIAGNOSIS — M958 Other specified acquired deformities of musculoskeletal system: Secondary | ICD-10-CM

## 2024-02-02 NOTE — Progress Notes (Signed)
 Patient presents for post-op visit today, f/u per Dr. Silva: [facility] 01/06/24 rt ankle arthroplasty, ligament repairs, tendon repairs with PRP injection given at time of surgery  Doing good. Keeping the foot up. Walking in the bathroom. Seen at [facility] and had hips checked for sciatica and had screws checked. Taking two oxycodone  in the morning and 1 in the afternoon and that is it.  Vital Signs: Today's Vitals   02/02/24 1138  PainSc: 8   PainLoc: Foot    Radiographs: []  Taken [x]  Not taken  Surgical Site Assessment:  - Dressing:  [x]  Minimal dry blood, intact []  Reinforced   [x]  Changed     -RN Notes: n/a  - Incision:  [x]  CDI (clean, dry, intact)  [x]  Mild erythema  []  Drainage noted   -RN Notes: n/a  - Swelling:  []  None  [x]  Mild  []  Moderate   []  Significant    - Bruising:  [x]  None  []  Present: n/a  - Sutures/staples:  [x]  Intact  [x]  Removed Today  []  Plan to remove at next visit     -RN Notes: steri-strips placed after staples removed.   -Cast/Splint/Pins: [x]  None []  Intact  []  Removed []  Plan to remove at next visit []  Replaced  -Signs of infection:  [x]  None  []  Present - Describe: n/a  -DME:    [x]  AFW []  Surgical shoe []  Cast  []  Splint  -Walking status:  []  Full WB  [x]  Partial WB  []  NWB  -Utilizing device:  []  None []  Knee Scooter [x]  Crutches []  Wheelchair    DVT assessment:  [x]  Denies symptoms []  Chest pain/SOB []  Pain in calf/redness/warmth   Redressed DSD and ace wrap. Educated on signs of infection, proper dressing care, pain management, and weight bearing status. Patient will contact provider with any new or worsening symptoms. The provider assessed the patient today and reviewed instructions regarding plan of care.   Addendum: Sutures removed, can bath and apply compression sleeve. WBAT in boot. OK to start P to begin ROM. No WB out of boot yet. Return 2 weeks Juliene Silva, [facility] 02/03/2024

## 2024-02-06 ENCOUNTER — Other Ambulatory Visit: Payer: Self-pay | Admitting: Podiatry

## 2024-02-06 ENCOUNTER — Other Ambulatory Visit: Payer: Self-pay

## 2024-02-06 ENCOUNTER — Other Ambulatory Visit: Payer: Self-pay | Admitting: Nurse Practitioner

## 2024-02-06 DIAGNOSIS — M958 Other specified acquired deformities of musculoskeletal system: Secondary | ICD-10-CM

## 2024-02-06 DIAGNOSIS — E118 Type 2 diabetes mellitus with unspecified complications: Secondary | ICD-10-CM

## 2024-02-06 MED ORDER — METFORMIN HCL ER 500 MG PO TB24
1000.0000 mg | ORAL_TABLET | Freq: Every day | ORAL | 1 refills | Status: AC
  Filled 2024-02-06 – 2024-02-14 (×2): qty 180, 90d supply, fill #0

## 2024-02-06 MED ORDER — OZEMPIC (1 MG/DOSE) 4 MG/3ML ~~LOC~~ SOPN
1.0000 mg | PEN_INJECTOR | SUBCUTANEOUS | 6 refills | Status: DC
Start: 2024-02-06 — End: 2024-02-07
  Filled 2024-02-06: qty 3, 28d supply, fill #0

## 2024-02-07 ENCOUNTER — Other Ambulatory Visit: Payer: Self-pay

## 2024-02-07 ENCOUNTER — Ambulatory Visit: Payer: Self-pay

## 2024-02-07 ENCOUNTER — Ambulatory Visit (HOSPITAL_COMMUNITY)

## 2024-02-07 ENCOUNTER — Telehealth: Payer: Self-pay | Admitting: Podiatry

## 2024-02-07 ENCOUNTER — Other Ambulatory Visit: Payer: Self-pay | Admitting: Podiatry

## 2024-02-07 ENCOUNTER — Telehealth: Payer: Self-pay

## 2024-02-07 ENCOUNTER — Encounter (HOSPITAL_COMMUNITY): Payer: Self-pay

## 2024-02-07 VITALS — BP 129/79 | HR 83

## 2024-02-07 DIAGNOSIS — M958 Other specified acquired deformities of musculoskeletal system: Secondary | ICD-10-CM

## 2024-02-07 DIAGNOSIS — E118 Type 2 diabetes mellitus with unspecified complications: Secondary | ICD-10-CM | POA: Diagnosis not present

## 2024-02-07 DIAGNOSIS — I10 Essential (primary) hypertension: Secondary | ICD-10-CM

## 2024-02-07 DIAGNOSIS — E782 Mixed hyperlipidemia: Secondary | ICD-10-CM

## 2024-02-07 MED ORDER — METOPROLOL SUCCINATE ER 25 MG PO TB24
25.0000 mg | ORAL_TABLET | Freq: Every day | ORAL | 3 refills | Status: AC
  Filled 2024-02-07: qty 90, 90d supply, fill #0

## 2024-02-07 MED ORDER — OZEMPIC (1 MG/DOSE) 4 MG/3ML ~~LOC~~ SOPN
1.0000 mg | PEN_INJECTOR | SUBCUTANEOUS | 3 refills | Status: AC
  Filled 2024-03-09: qty 9, 84d supply, fill #0

## 2024-02-07 MED ORDER — OXYCODONE HCL 5 MG PO TABS
5.0000 mg | ORAL_TABLET | Freq: Four times a day (QID) | ORAL | 0 refills | Status: DC | PRN
  Filled 2024-02-07: qty 20, 5d supply, fill #0

## 2024-02-07 MED ORDER — LISINOPRIL-HYDROCHLOROTHIAZIDE 10-12.5 MG PO TABS
1.0000 | ORAL_TABLET | Freq: Every day | ORAL | 3 refills | Status: AC
  Filled 2024-02-07: qty 90, 90d supply, fill #0

## 2024-02-07 NOTE — Patient Instructions (Signed)
 It was nice to see you today!  Your goal blood sugar is 80-130 before eating and less than 180 after eating. Goal blood pressure is less than 130/80 mmHg  Medication Changes:  Continue all other medication the same. We will send you a message about your cholesterol results, or call you if we need to make any changes.  To maintain your vitamin D  levels, you can start taking 1000 units once daily. You can purchase this over the counter at [facility] pharmacy.  Keep up the good work with diet and exercise. Aim for a diet full of vegetables, fruit and lean meats (chicken, [place], fish). Try to limit salt intake by eating fresh or frozen vegetables (instead of canned), rinse canned vegetables prior to cooking and do not add any additional salt to meals.   Lorain Baseman, PharmD [facility] [phone]

## 2024-02-07 NOTE — Telephone Encounter (Signed)
 Patient called requesting a prescription for pain medication. His preferred pharmacy is the [facility].

## 2024-02-07 NOTE — Telephone Encounter (Signed)
 Patient verbalized he would like a call when referral is placed for dermatology so he can call them to jump the gun based on trying to get an appointment with dermatology due to their appointment availability.

## 2024-02-07 NOTE — Telephone Encounter (Signed)
 Rx sent via refill request from pharmacy

## 2024-02-07 NOTE — Progress Notes (Signed)
 02/07/2024 Name: Aaron Kennedy MRN: [id number] DOB: 1971/01/26  Chief Complaint  Patient presents with   Diabetes   Hypertension   Hyperlipidemia    Aaron Kennedy is a 53 y.o. year old male who presented for a face-to-face visit.   They were referred to the pharmacist by their PCP for assistance in managing diabetes and hyperlipidemia. PMH includes T2DM, PAD, Stroke (2016), tobacco use, HFmrEF.  Subjective: Patient was last engaged by pharmacy vis telephone on 09/04/23. He was happy with the improvement in his BG since increasing Ozempic  to 1 mg weekly. No medication changes were made. He saw his PCP, [name], NP on 10/07/23. His A1C had improved from 7.9% to 6.5%. His LDL-C increased from 146 mg/dL to 836 mg/dL. He was engaged by pharmacy via telephone on 10/31/23. He reported doing well. He continued to have difficulty adhering to atorvastatin . He was instructed to start Repatha  140 mg every 14 days. At pharmacy call on 12/01/23, patient reported tolerating Repatha  well. He has since had ankle surgery on 12/27/23.  Today, patient reports doing well. He has current supplies of all his medications and requested refills yesterday of Ozempic  and metformin , which were fulfilled. He reports he is recovering well from ankle surgery - he is still in a boot today. He requests if I can coordinate with his PCP to place a referral to dermatology.   Care Team: Primary Care Provider: [name], NP ; Next Scheduled Visit: 04/09/24  Medication Access/Adherence  Current Pharmacy:  [facility] - [facility] [address] [place] [address] Phone: [phone] Fax: [phone]  [facility] [address] [place] [address] Phone: [phone] Fax: [phone]   Patient reports barriers to adherence - he forgets to take PM medications (atorvastatin ), and struggles to take all his  medications in the AM because he does not always eat breakfast.   Patient denies transportation barriers to taking his medications.  Patient denies cost barriers to taking his medications  Diabetes:  Current medications: metformin  XR 500 mg PO BID, semaglutide  (Ozempic ) 1 mg subcutaneous weekly (Thursdays)   Current glucose readings: reports recent FBG of 96 mg/dL, but he has not been checking very often  Denies GI AE with Ozempic , still some appetite suppression, but not as severe as before.   Patient denies hypoglycemic s/sx including dizziness, shakiness, sweating. Patient denies hyperglycemic symptoms including polyuria, polydipsia, polyphagia, nocturia, neuropathy. He has stable blurry vision.  Current meal patterns (2 meals/day): trying to avoid fried foods - eating more broiled and grilled foods. Reports sugary and greasy foods no longer taste as good to him. - Breakfast: boiled eggs, sausage, egg, and biscuit - often picks something up from [employer]. - Supper: grilled/broiled shrimp or fish, home fries, broccoli, pork chops + green beans + red potatoes  - Snacks: cucumber with vinegar, watermelon  - Drinks: coffee with creamer. Has mostly been able to cut out sugar. Having zero-sugar gatorade inteasd. Occasional orange juice He does not drink much water  throughout the day.   Hypertension:  Current medications: metoprolol  succinate 25 mg daily, lisinopril /hydrochlorothiazide  10/12.5 mg daily  Hyperlipidemia/ASCVD Risk Reduction  Current lipid lowering medications: atorvastatin  80 mg daily (does not take regularly), Repatha  140 mg [place] every 14 days - Patient reported some GI upset with atorvastatin  at first but was advised by doctor to take with food, which helped.   Denies issues with tolerability of Repatha . Denies missed  doses.   Antiplatelet regimen: clopidogrel  75 mg daily (has previously reported taking every other day due to thin skin, bleeding, bruising)  ASCVD  History: PAD, CVA  Family History: stroke (sister), heart attack (brother) Risk Factors: HLD, HTN, DM, premature ASCVD  The ASCVD Risk score (Arnett DK, et al., 2019) failed to calculate for the following reasons:   Risk score cannot be calculated because patient has a medical history suggesting prior/existing ASCVD    Objective:  BP Readings from Last 3 Encounters:  02/07/24 129/79  01/26/24 127/87  01/06/24 (!) 142/64    Lab Results  Component Value Date   HGBA1C 6.6 (H) 11/25/2023   HGBA1C 6.5 (A) 10/07/2023   HGBA1C 7.9 (A) 06/23/2023    Lab Results  Component Value Date   CREATININE 0.80 11/25/2023   BUN 12 11/25/2023   NA 136 11/25/2023   K 3.8 11/25/2023   CL 105 11/25/2023   CO2 21 (L) 11/25/2023    Lab Results  Component Value Date   CHOL 213 (H) 10/07/2023   HDL 27 (L) 10/07/2023   LDLCALC 163 (H) 10/07/2023   TRIG 124 10/07/2023   CHOLHDL 7.9 (H) 10/07/2023    BP Readings from Last 3 Encounters:  02/07/24 129/79  01/26/24 127/87  01/06/24 (!) 142/64    Medications Reviewed Today     Reviewed by Brinda Lorain SQUIBB, RPH (Pharmacist) on 02/07/24 at 1303  Med List Status: <None>   Medication Order Taking? Sig Documenting Provider Last Dose Status Informant  Accu-Chek Softclix Lancets lancets [id number]  Check up to twice daily. Oley Bascom RAMAN, NP  Active Self  atorvastatin  (LIPITOR) 80 MG tablet [id number] Yes Take 1 tablet (80 mg total) by mouth daily.  Patient taking differently: Take 1 tablet (80 mg total) by mouth daily.   Oley Bascom RAMAN, NP  Active Self           Med Note CLAUD, YOLINDA T   Tue Nov 22, 2023 10:20 AM)    Blood Glucose Monitoring Suppl (BLOOD GLUCOSE MONITOR SYSTEM) w/Device KIT [id number]  Use as directed up to twice daily. Oley Bascom RAMAN, NP  Active Self  clopidogrel  (PLAVIX ) 75 MG tablet [id number] Yes Take 1 tablet (75 mg total) by mouth daily. Oley Bascom RAMAN, NP  Active Self           Med Note CLAUD, MICHEAL T   Tue Dec 27, 2023  9:35 AM)    cyclobenzaprine  (FLEXERIL ) 10 MG tablet [id number]  Take 1 tablet (10 mg total) by mouth 3 (three) times daily as needed for muscle spasms. Oley Bascom RAMAN, NP  Active   Evolocumab  (REPATHA  SURECLICK) 140 MG/ML EMMANUEL [id number] Yes Inject 140 mg into the skin every 14 (fourteen) days. Oley Bascom RAMAN, NP  Active Self           Med Note CLAUD, MICHEAL DASEN   Tue Dec 27, 2023  9:34 AM) Next dose due:12/29/23  gabapentin  (NEURONTIN ) 600 MG tablet [id number]  Take 1 tablet (600 mg total) by mouth 2 (two) times daily. Oley Bascom RAMAN, NP  Active   Glucose Blood (BLOOD GLUCOSE TEST STRIPS) STRP [id number]  Check up to twice daily. Oley Bascom RAMAN, NP  Active Self  Lancet Device MISC [id number]  Check up to twice daily. May substitute to any manufacturer covered by patient's insurance. Nichols, Tonya S, NP  Active Self  lisinopril -hydrochlorothiazide  (ZESTORETIC ) 10-12.5 MG tablet [id number] Yes TAKE 1 TABLET BY MOUTH DAILY. Oley Bascom RAMAN, NP  Active   metFORMIN  (GLUCOPHAGE -XR) 500 MG 24 hr tablet [id number] Yes Take 2 tablets (1,000 mg total) by mouth daily with breakfast. Nichols, Tonya S, NP  Active   metoprolol  succinate (TOPROL -XL) 25 MG 24 hr tablet [id number] Yes Take 1 tablet (25 mg total) by mouth daily. Oley Bascom RAMAN, NP  Active   NON FORMULARY [id number]  Take 2 mg by mouth at bedtime. Klonopin 1 mg [provider]  Active Self           Med Note CLAUD, MICHEAL DASEN   Tue Dec 27, 2023  9:39 AM) Patient reports taking 2 mg of Klonopin (not prescribed to him) nightly, using a friend's medication. Disclosure made due to upcoming anesthesia and the controlled nature of the drug.  oxyCODONE  (OXY IR/ROXICODONE ) 5 MG immediate release tablet [id number]  Take 1 tablet (5 mg total) by mouth every 6 (six) hours as needed for up to 20 doses for severe pain (pain score 7-10). Silva Juliene SAUNDERS, DPM  Active   Semaglutide , 1 MG/DOSE, (OZEMPIC , 1 MG/DOSE,) 4 MG/3ML SOPN [id number] Yes Inject  1 mg as directed once a week. Oley Bascom RAMAN, NP  Active             Assessment/Plan:   Diabetes: - Currently controlled based on last A1c 6.6%, below goal < 7%. Patient is a greatcandidate for continued tx with GLP-1RA given ASCVD and BMI 31. 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  - Reviewed long term cardiovascular and renal outcomes of uncontrolled blood sugar - Reviewed goal A1c, goal fasting, and goal 2 hour post prandial glucose - Reviewed dietary modifications including: reducing sugary beverage intake (congratulated on cutting back on Pepsi), focusing on meals with more protein and vegetables than carbohydrates, and combing protein with carbs to spike blood glucose less.  - Recommend to continue metformin  XR 1000 mg daily in the morning. - Recommend to continue Ozempic  1 mg weekly.  - Recommend to check glucose in the morning while fasting and two-hours after a meal a couple times per week - Next A1C due 02/25/24 - Obtained UACR today- if elevated, consider initiation of SGLT2i, possibly in combination with metformin .   Hypertension: - Currently controlled with clinic BP readings below goal < 130/80 mmHg. Appropriate to continue current regimen - Reviewed long term cardiovascular and renal outcomes of uncontrolled blood pressure - Reviewed appropriate blood pressure monitoring technique and reviewed goal blood pressure. Recommended to check home blood pressure and heart rate periodically - Recommend to continue metoprolol  succinate 25 mg daily and lisinopril /hydrochlorothiazide  10/12.5 mg daily   Hyperlipidemia/ASCVD Risk Reduction: - Currently uncontrolled based on most recent LDL of 163 mg/dL in March 2025, increased from LDL-C of 143 mg/dL on last lipid panel. LDL goal <55.Patient has been taking PCSK9i, Repatha  for about 3 months now - will repeat lipid  panel today. Appropriate to continue current regimen. - Reviewed long term complications of uncontrolled cholesterol - Recommend to continue atorvastatin  80 mg daily with food  - Recommend to continue Repatha  140 mg injected into the skin once every 14 days.  - Obtained fasting lipid panel today   Will request that PCP place referral to dermatology per patient request. He has a spot on his face that he would like to be checked out, especially since his brother just had a diagnosis of skin cancer.  Follow Up Plan: Pharmacist telephone 04/17/24, PCP 04/09/24   Lorain Baseman, PharmD [facility] [phone]

## 2024-02-08 ENCOUNTER — Ambulatory Visit: Payer: Self-pay | Admitting: Nurse Practitioner

## 2024-02-08 ENCOUNTER — Ambulatory Visit (HOSPITAL_COMMUNITY): Attending: Podiatry | Admitting: Physical Therapy

## 2024-02-08 ENCOUNTER — Other Ambulatory Visit: Payer: Self-pay

## 2024-02-08 DIAGNOSIS — M25571 Pain in right ankle and joints of right foot: Secondary | ICD-10-CM | POA: Insufficient documentation

## 2024-02-08 DIAGNOSIS — R262 Difficulty in walking, not elsewhere classified: Secondary | ICD-10-CM | POA: Diagnosis not present

## 2024-02-08 DIAGNOSIS — M25371 Other instability, right ankle: Secondary | ICD-10-CM | POA: Insufficient documentation

## 2024-02-08 DIAGNOSIS — M25671 Stiffness of right ankle, not elsewhere classified: Secondary | ICD-10-CM | POA: Insufficient documentation

## 2024-02-08 DIAGNOSIS — Z9889 Other specified postprocedural states: Secondary | ICD-10-CM | POA: Insufficient documentation

## 2024-02-08 LAB — LIPID PANEL
Chol/HDL Ratio: 2.5 ratio (ref 0.0–5.0)
Cholesterol, Total: 94 mg/dL — ABNORMAL LOW (ref 100–199)
HDL: 38 mg/dL — ABNORMAL LOW (ref >39)
LDL Chol Calc (NIH): 41 mg/dL (ref 0–99)
Triglycerides: 72 mg/dL (ref 0–149)
VLDL Cholesterol Cal: 15 mg/dL (ref 5–40)

## 2024-02-08 LAB — MICROALBUMIN / CREATININE URINE RATIO
Creatinine, Urine: 178.8 mg/dL
Microalb/Creat Ratio: 16 mg/g{creat} (ref 0–29)
Microalbumin, Urine: 27.8 ug/mL

## 2024-02-08 NOTE — Therapy (Signed)
 OUTPATIENT PHYSICAL THERAPY LOWER EXTREMITY EVALUATION   Patient Name: Aaron Kennedy MRN: [id number] DOB:22-Jan-1971, 53 y.o., male Today's Date: 02/08/2024  END OF SESSION:  PT End of Session - 02/08/24 0902     Visit Number 1    Number of Visits 12    Date for Recertification  03/21/24    Authorization Type healthy blue    PT Start Time 0815    PT Stop Time 0900    PT Time Calculation (min) 45 min    Activity Tolerance Patient tolerated treatment well    Behavior During Therapy [facility] for tasks assessed/performed          Past Medical History:  Diagnosis Date   Ankle instability, right    Anxiety    Arthritis    Chronic pain of right knee    DM type 2, controlled, with complication (HCC) 08/26/2021   Hypertension    Insomnia 01/2019   Osteochondral defect of talus    PAD (peripheral artery disease)    Peroneal tendinitis, right    Posterior tibial tendonitis, right    Stroke [facility])    right side weakness   Past Surgical History:  Procedure Laterality Date   ANKLE ARTHROSCOPY WITH DRILLING/MICROFRACTURE Right 01/06/2024   Procedure: ARTHROSCOPY, ANKLE, WITH MICROFRACTURE;  Surgeon: Medicine Juliene JONELLE, DPM;  Location: [facility] ORS;  Service: Orthopedics/Podiatry;  Laterality: Right;   EXCISION MASS LOWER EXTREMETIES Right 11/29/2023   Procedure: EXCISION MASS LOWER EXTREMITIES / excision of bone spur  right tibia;  Surgeon: Margrette Taft BRAVO, MD;  Location: [facility] ORS;  Service: Orthopedics;  Laterality: Right;   HERNIA REPAIR     INCISION AND DRAINAGE ABSCESS Left 07/21/2017   Procedure: INCISION AND DRAINAGE INGUINAL HERNIA ABSCESS;  Surgeon: Ebbie Cough, MD;  Location: [facility] OR;  Service: General;  Laterality: Left;   INGUINAL HERNIA REPAIR Bilateral 05/07/2017   Procedure: LAPAROSCOPIC RIGHT  INGUINAL HERNIA REPAIR AND  OPEN LEFT   INGUINAL EXPLORATION;  Surgeon: Rubin Calamity, MD;  Location: [facility] OR;  Service: General;  Laterality: Bilateral;   INSERTION OF MESH  Bilateral 05/07/2017   Procedure: INSERTION OF MESH;  Surgeon: Rubin Calamity, MD;  Location: [facility] OR;  Service: General;  Laterality: Bilateral;   LEG SURGERY Right    bone spur removed from foot   LIGAMENT REPAIR Right 01/06/2024   Procedure: REPAIR, LIGAMENT;  Surgeon: Medicine Juliene JONELLE, DPM;  Location: [facility] ORS;  Service: Orthopedics/Podiatry;  Laterality: Right;  GEN WITH POP BLOCK   ORCHIECTOMY Left 05/11/2023   Procedure: LEFT REMOVAL OF INGUINAL MESH;  Surgeon: Rubin Calamity, MD;  Location: [facility] ORS;  Service: General;  Laterality: Left;   Patient Active Problem List   Diagnosis Date Noted   Osteochondral defect of talus 01/07/2024   Tear of deltoid ligament of ankle, right, initial encounter 01/07/2024   Ankle instability, right 01/07/2024   Closed avulsion fracture of medial malleolus of right tibia 01/07/2024   Ganglion of right ankle 01/07/2024   Tenosynovitis of tibialis posterior tendon 01/07/2024   Osteochondroma of right tibia s/p excision 11/29/23 12/14/2023   Mass of tibia 11/29/2023   Skin wound from surgical incision 06/10/2022   DM type 2, controlled, with complication (HCC) 08/26/2021   Insomnia 06/23/2019   Anxiety 06/23/2019   Soft tissue abscess of inguinal region 07/21/2017   Abscess 06/04/2017   Recurrent inguinal hernia 05/07/2017   S/P hernia repair 05/07/2017   Chronic pain of right knee 04/22/2016   PAD (peripheral artery disease) 05/02/2015  Stroke (cerebrum) (HCC) 04/08/2015   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   CVA (cerebral vascular accident) (HCC) 04/03/2015   Seizures (HCC) 04/03/2015   Stroke (HCC) 03/22/2015   HTN (hypertension) 03/22/2015   HLD (hyperlipidemia) 03/22/2015   Prediabetes 03/22/2015   Tobacco use disorder 03/22/2015   Cerebral infarction, unspecified (HCC) 03/22/2015   [name] (transient ischemic attack) 03/18/2015   History of fracture of ankle    Right ankle pain     PCP: [name]  REFERRING PROVIDER: Silva Juliene SAUNDERS, DPM  REFERRING DIAG:  Diagnosis  [phone] (ICD-10-CM) - Post-operative state  M25.371 (ICD-10-CM) - Ankle instability, right    THERAPY DIAG:  Right ankle pain Right ankle stiffness Difficulty in walking.   Rationale for Evaluation and Treatment: Rehabilitation  ONSET DATE: 01/06/24  SUBJECTIVE:   SUBJECTIVE STATEMENT: PT states that he had multiple sprains in his Rt ankle due to years of basketball. He is currently putting full weight on his ankle with his cam boot.  PT states that his MD states that he is still not able to walk without the boot.    PERTINENT HISTORY: PT had Rt ankle arthroscopic surgery  on 9/19 for osteochondral defect of the talus and ligament repair.  Pt has a remote hx of CVA and seizures.   PAIN:  Are you having pain? Yes: Pain location: 5 with pain meds; highest goes to a 9 Pain description: burning  Aggravating factors: activity  Relieving factors: rest and pain meds   PRECAUTIONS: Other: WB  RED FLAGS: None   WEIGHT BEARING RESTRICTIONS: Yes WBAT with boot   FALLS:  Has patient fallen in last 6 months? No  LIVING ENVIRONMENT: Lives with: lives with their family Lives in: House/apartment Stairs: Yes: External: 6 steps; on right going up not completing in a reciprocal    OCCUPATION: none  PLOF: Independent  PATIENT GOALS: To be able to be on his feet all day without pain   NEXT MD VISIT: 02/16/24  OBJECTIVE:  Note: Objective measures were completed at Evaluation unless otherwise noted.    PATIENT SURVEYS:  LEFS  Extreme difficulty/unable (0), Quite a bit of difficulty (1), Moderate difficulty (2), Little difficulty (3), No difficulty (4) Survey date:  02/08/24  Any of your usual work, housework or school activities 1  2. Usual hobbies, recreational or sporting activities 0  3. Getting into/out of the bath 2  4. Walking between rooms 2  5. Putting on socks/shoes 1  6. Squatting   0  7. Lifting an object, like a bag of groceries from the floor 2  8. Performing light activities around your home 2  9. Performing heavy activities around your home 1  10. Getting into/out of a car 2  11. Walking 2 blocks 0  12. Walking 1 mile 0  13. Going up/down 10 stairs (1 flight) 2  14. Standing for 1 hour 1  15.  sitting for 1 hour 4  16. Running on even ground 0  17. Running on uneven ground 0  18. Making sharp turns while running fast 0  19. Hopping  0  20. Rolling over in bed 2  Score total:  22/80     COGNITION: Overall cognitive status: Within functional limits for tasks assessed   EDEMA:  Noted medial  PALPATION: Tender to palpation   LOWER EXTREMITY ROM:  Active ROM Right eval Left eval  Hip flexion    Hip extension    Hip abduction  Hip adduction    Hip internal rotation    Hip external rotation    Knee flexion    Knee extension    Ankle dorsiflexion - 8 12  Ankle plantarflexion 50 40  Ankle inversion 5 20  Ankle eversion 10 12   (Blank rows = not tested)  LOWER EXTREMITY MMT:  MMT Right eval Left eval  Hip flexion    Hip extension    Hip abduction    Hip adduction    Hip internal rotation    Hip external rotation    Knee flexion    Knee extension    Ankle dorsiflexion 4-/5   Ankle plantarflexion 4-   Ankle inversion 4-   Ankle eversion 3+    (Blank rows = not tested)  FUNCTIONAL TESTS:  2 minute walk test: 286 ft in 2 minutes.                                                                                                                                 TREATMENT DATE: 02/08/24 evaluation   Long sit:   Dorsiflexion AROM  10 reps - 10 hold -  Inversion Eversion A 10 reps - 10 hold -  Eversion AROM   10 reps - 10 hold - Supine Ankle Circles  - 10 reps   PATIENT EDUCATION:  Education details: HEP Person educated: Patient Education method: Programmer, multimedia, Demonstration, Verbal cues, and Handouts Education comprehension:  verbalized understanding  HOME EXERCISE PROGRAM: Access Code: [email or web address] URL: [email or web address] Date: 02/08/2024 Prepared by: Montie Metro  Exercises - Long Sitting Ankle Dorsiflexion AROM  - 3 x daily - 7 x weekly - 1 sets - 10 reps - 10 hold - Supine Ankle Inversion Eversion AROM  - 3 x daily - 7 x weekly - 1 sets - 10 reps - 10 hold - Supine Ankle Eversion AROM  - 3 x daily - 7 x weekly - 1 sets - 10 reps - 10 hold - Supine Ankle Circles  - 3 x daily - 7 x weekly - 1 sets - 10 reps  ASSESSMENT:  CLINICAL IMPRESSION: Patient is a 54 y.o. male who was seen today for physical therapy evaluation and treatment for s/p ligament repair to RT ankle.  PT has decreased ROM, decreased strength, difficulty walking and increased pain in his Rt ankle.  This is the side his stroke affected, the pt states that he has had increased balance problems ever since his stroke.  As the pt is able to be WB without the [place] we will work on his balance but this might be prior level of function. Mr. Gowan will benefit from skilled PT to address his deficits and maximize his functioning ability.   OBJECTIVE IMPAIRMENTS: cardiopulmonary status limiting activity, decreased activity tolerance, decreased balance, decreased mobility, difficulty walking, decreased ROM, and decreased strength.   ACTIVITY LIMITATIONS: carrying, lifting, standing, squatting, and locomotion level  PARTICIPATION LIMITATIONS: shopping, community activity, and  yard work  PERSONAL FACTORS: 1 comorbidity: CVA are also affecting patient's functional outcome.   REHAB POTENTIAL: Good  CLINICAL DECISION MAKING: Stable/uncomplicated  EVALUATION COMPLEXITY: Moderate   GOALS: Goals reviewed with patient? Yes  SHORT TERM GOALS: Target date: 02/29/24 PT to be I in HEP to improve his ROM and strength of his Rt ankle for improved mobility  Baseline: Goal status: INITIAL  2.  PT to rt ankle pain to be no greater  than a 5/10 Baseline:  Goal status: INITIAL  3.   PT LEFS score to be 26/80 Baseline:  Goal status: INITIAL  LONG TERM GOALS: Target date: 03/21/24  PT to be I in advanced HEP to improve his ROM and strength of his Rt ankle for improved mobility Baseline:  Goal status: INITIAL  2.   PT to rt ankle pain to be no greater than a 3/10 Baseline:  Goal status: INITIAL  3.  PT to be ambulating without his cam boot  Baseline:  Goal status: INITIAL  4.  PT LEFS score to be 30/80 Baseline:  Goal status: INITIAL   PLAN:  PT FREQUENCY: 2x/week  PT DURATION: 6 weeks  PLANNED INTERVENTIONS: 97110-Therapeutic exercises, 97530- Therapeutic activity, 97535- Self Care, 02859- Manual therapy, and Patient/Family education  PLAN FOR NEXT SESSION: Concentrate on ROM to begin with; ankle alphabet, sitting heel slide, sitting baps....; progress to strength and balance   Montie Metro, PT CLT [phone]  2024/02/26, 9:03 AM    Managed Medicaid Authorization Request Treatment Start Date: 02-26-24  Visit Dx Codes:  Diagnosis  Z98.890 (ICD-10-CM) - Post-operative state  M25.371 (ICD-10-CM) - Ankle instability, right  M25.571;M25.671; R26.2  Functional Tool Score: LEFS 22/80  For all possible CPT codes, reference the Planned Interventions line above.     Check all conditions that are expected to impact treatment: {Conditions expected to impact treatment:Diabetes mellitus and Neurological condition and/or seizures   If treatment provided at initial evaluation, no treatment charged due to lack of authorization.

## 2024-02-10 ENCOUNTER — Encounter (HOSPITAL_COMMUNITY): Payer: Self-pay

## 2024-02-10 ENCOUNTER — Ambulatory Visit (HOSPITAL_COMMUNITY)

## 2024-02-10 DIAGNOSIS — M25571 Pain in right ankle and joints of right foot: Secondary | ICD-10-CM

## 2024-02-10 DIAGNOSIS — M25371 Other instability, right ankle: Secondary | ICD-10-CM | POA: Diagnosis not present

## 2024-02-10 DIAGNOSIS — R262 Difficulty in walking, not elsewhere classified: Secondary | ICD-10-CM | POA: Diagnosis not present

## 2024-02-10 DIAGNOSIS — M25671 Stiffness of right ankle, not elsewhere classified: Secondary | ICD-10-CM | POA: Diagnosis not present

## 2024-02-10 DIAGNOSIS — Z9889 Other specified postprocedural states: Secondary | ICD-10-CM | POA: Diagnosis not present

## 2024-02-10 NOTE — Therapy (Signed)
 OUTPATIENT PHYSICAL THERAPY LOWER EXTREMITY TREATMENT   Patient Name: Aaron Kennedy MRN: [id number] DOB:07-08-1970, 53 y.o., male Today's Date: 02/10/2024  END OF SESSION:  PT End of Session - 02/10/24 1423     Visit Number 2    Number of Visits 12    Date for Recertification  03/21/24    Authorization Type healthy blue    Authorization Time Period 02/08/24-05/08/24    Authorization - Visit Number 1    Authorization - Number of Visits 13    Progress Note Due on Visit 10    PT Start Time 1420    PT Stop Time 1504    PT Time Calculation (min) 44 min    Activity Tolerance Patient tolerated treatment well    Behavior During Therapy [facility] for tasks assessed/performed           Past Medical History:  Diagnosis Date   Ankle instability, right    Anxiety    Arthritis    Chronic pain of right knee    DM type 2, controlled, with complication (HCC) 08/26/2021   Hypertension    Insomnia 01/2019   Osteochondral defect of talus    PAD (peripheral artery disease)    Peroneal tendinitis, right    Posterior tibial tendonitis, right    Stroke (HCC)    right side weakness   Past Surgical History:  Procedure Laterality Date   ANKLE ARTHROSCOPY WITH DRILLING/MICROFRACTURE Right 01/06/2024   Procedure: ARTHROSCOPY, ANKLE, WITH MICROFRACTURE;  Surgeon: Medicine Juliene JONELLE, DPM;  Location: [facility] ORS;  Service: Orthopedics/Podiatry;  Laterality: Right;   EXCISION MASS LOWER EXTREMETIES Right 11/29/2023   Procedure: EXCISION MASS LOWER EXTREMITIES / excision of bone spur  right tibia;  Surgeon: Margrette Taft BRAVO, MD;  Location: [facility];  Service: Orthopedics;  Laterality: Right;   HERNIA REPAIR     INCISION AND DRAINAGE ABSCESS Left 07/21/2017   Procedure: INCISION AND DRAINAGE INGUINAL HERNIA ABSCESS;  Surgeon: Ebbie Cough, MD;  Location: [facility] OR;  Service: General;  Laterality: Left;   INGUINAL HERNIA REPAIR Bilateral 05/07/2017   Procedure: LAPAROSCOPIC RIGHT  INGUINAL HERNIA REPAIR  AND  OPEN LEFT   INGUINAL EXPLORATION;  Surgeon: Rubin Calamity, MD;  Location: [facility] OR;  Service: General;  Laterality: Bilateral;   INSERTION OF MESH Bilateral 05/07/2017   Procedure: INSERTION OF MESH;  Surgeon: Rubin Calamity, MD;  Location: [facility] OR;  Service: General;  Laterality: Bilateral;   LEG SURGERY Right    bone spur removed from foot   LIGAMENT REPAIR Right 01/06/2024   Procedure: REPAIR, LIGAMENT;  Surgeon: Medicine Juliene JONELLE, DPM;  Location: [facility] OR[facility];  Service: Orthopedics/Podiatry;  Laterality: Right;  GEN WITH POP BLOCK   ORCHIECTOMY Left 05/11/2023   Procedure: LEFT REMOVAL OF INGUINAL MESH;  Surgeon: Rubin Calamity, MD;  Location: [facility];  Service: General;  Laterality: Left;   Patient Active Problem List   Diagnosis Date Noted   Osteochondral defect of talus 01/07/2024   Tear of deltoid ligament of ankle, right, initial encounter 01/07/2024   Ankle instability, right 01/07/2024   Closed avulsion fracture of medial malleolus of right tibia 01/07/2024   Ganglion of right ankle 01/07/2024   Tenosynovitis of tibialis posterior tendon 01/07/2024   Osteochondroma of right tibia s/p excision 11/29/23 12/14/2023   Mass of tibia 11/29/2023   Skin wound from surgical incision 06/10/2022   DM type 2, controlled, with complication (HCC) 08/26/2021   Insomnia 06/23/2019   Anxiety 06/23/2019   Soft tissue abscess of  inguinal region 07/21/2017   Abscess 06/04/2017   Recurrent inguinal hernia 05/07/2017   S/P hernia repair 05/07/2017   Chronic pain of right knee 04/22/2016   PAD (peripheral artery disease) 05/02/2015   Stroke (cerebrum) (HCC) 04/08/2015   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   CVA (cerebral vascular accident) (HCC) 04/03/2015   Seizures (HCC) 04/03/2015   Stroke (HCC) 03/22/2015   HTN (hypertension) 03/22/2015   HLD (hyperlipidemia) 03/22/2015   Prediabetes 03/22/2015   Tobacco use disorder 03/22/2015   Cerebral infarction,  unspecified (HCC) 03/22/2015   [name] (transient ischemic attack) 03/18/2015   History of fracture of ankle    Right ankle pain     PCP: [name]  REFERRING PROVIDER: Silva Juliene SAUNDERS, DPM  REFERRING DIAG:  Diagnosis  [phone] (ICD-10-CM) - Post-operative state  M25.371 (ICD-10-CM) - Ankle instability, right    THERAPY DIAG:  Right ankle pain Right ankle stiffness Difficulty in walking.   Rationale for Evaluation and Treatment: Rehabilitation  ONSET DATE: 01/06/24  SUBJECTIVE:   SUBJECTIVE STATEMENT: Patient reports that he is hurting a little bit today. He has to use his boot to walk as he has tried to walk without it and it didn't go well. He feels that the stiffness is beginning to improve.  Eval: PT states that he had multiple sprains in his Rt ankle due to years of basketball. He is currently putting full weight on his ankle with his cam boot.  PT states that his MD states that he is still not able to walk without the boot.    PERTINENT HISTORY: PT had Rt ankle arthroscopic surgery  on 9/19 for osteochondral defect of the talus and ligament repair.  Pt has a remote hx of CVA and seizures.   PAIN:  Are you having pain? Yes: Pain location: 5-6/10 Pain description: burning  Aggravating factors: activity  Relieving factors: rest and pain meds   PRECAUTIONS: Other: WB  RED FLAGS: None   WEIGHT BEARING RESTRICTIONS: Yes WBAT with boot   FALLS:  Has patient fallen in last 6 months? No  LIVING ENVIRONMENT: Lives with: lives with their family Lives in: House/apartment Stairs: Yes: External: 6 steps; on right going up not completing in a reciprocal    OCCUPATION: none  PLOF: Independent  PATIENT GOALS: To be able to be on his feet all day without pain   NEXT MD VISIT: 02/16/24  OBJECTIVE:  Note: Objective measures were completed at Evaluation unless otherwise noted.    PATIENT SURVEYS:  LEFS  Extreme difficulty/unable (0), Quite a bit of difficulty  (1), Moderate difficulty (2), Little difficulty (3), No difficulty (4) Survey date:  02/08/24  Any of your usual work, housework or school activities 1  2. Usual hobbies, recreational or sporting activities 0  3. Getting into/out of the bath 2  4. Walking between rooms 2  5. Putting on socks/shoes 1  6. Squatting  0  7. Lifting an object, like a bag of groceries from the floor 2  8. Performing light activities around your home 2  9. Performing heavy activities around your home 1  10. Getting into/out of a car 2  11. Walking 2 blocks 0  12. Walking 1 mile 0  13. Going up/down 10 stairs (1 flight) 2  14. Standing for 1 hour 1  15.  sitting for 1 hour 4  16. Running on even ground 0  17. Running on uneven ground 0  18. Making sharp turns while running fast  0  19. Hopping  0  20. Rolling over in bed 2  Score total:  22/80     COGNITION: Overall cognitive status: Within functional limits for tasks assessed   EDEMA:  Noted medial  PALPATION: Tender to palpation   LOWER EXTREMITY ROM:  Active ROM Right eval Left eval  Hip flexion    Hip extension    Hip abduction    Hip adduction    Hip internal rotation    Hip external rotation    Knee flexion    Knee extension    Ankle dorsiflexion - 8 12  Ankle plantarflexion 50 40  Ankle inversion 5 20  Ankle eversion 10 12   (Blank rows = not tested)  LOWER EXTREMITY MMT:  MMT Right eval Left eval  Hip flexion    Hip extension    Hip abduction    Hip adduction    Hip internal rotation    Hip external rotation    Knee flexion    Knee extension    Ankle dorsiflexion 4-/5   Ankle plantarflexion 4-   Ankle inversion 4-   Ankle eversion 3+    (Blank rows = not tested)  FUNCTIONAL TESTS:  2 minute walk test: 286 ft in 2 minutes.                                                                                                                                 TREATMENT DATE:                                    02/10/24  EXERCISE LOG  Exercise Repetitions and Resistance Comments  Patient education  See below    Manual therapy  STM: right gastrocnemius, achilles, and plantar fascia Joint mobilizations: grade I-III subtalar, talocrural, and midfoot PROM: all R ankle planes to tolerance   Ankle circles  20 reps each  CW and CCW  Ankle PF/DF  30 reps each    Ankle inversion/eversion 30 reps each    Nustep   L3 x 5.5 minutes @ seat 12    Blank cell = exercise not performed today   02/08/24 evaluation   Long sit:   Dorsiflexion AROM  10 reps - 10 hold -  Inversion Eversion A 10 reps - 10 hold -  Eversion AROM   10 reps - 10 hold - Supine Ankle Circles  - 10 reps   PATIENT EDUCATION:  Education details: HEP, healing, expectations for soreness, anatomy, and prognosis Person educated: Patient Education method: Explanation and Demonstration Education comprehension: verbalized understanding  HOME EXERCISE PROGRAM: Access Code: [email or web address] URL: [email or web address] Date: 02/08/2024 Prepared by: Montie Metro  Exercises - Long Sitting Ankle Dorsiflexion AROM  - 3 x daily - 7 x weekly - 1 sets - 10 reps - 10 hold - Supine Ankle Inversion Eversion AROM  - 3  x daily - 7 x weekly - 1 sets - 10 reps - 10 hold - Supine Ankle Eversion AROM  - 3 x daily - 7 x weekly - 1 sets - 10 reps - 10 hold - Supine Ankle Circles  - 3 x daily - 7 x weekly - 1 sets - 10 reps  ASSESSMENT:  CLINICAL IMPRESSION: Today's treatment focused on manual therapy to facilitate improved right ankle mobility. Right ankle joint mobilizations and passive range of motion was the most effective at improving his mobility. He noted an improvement in his familiar pain with passive range of motion. This was followed by AROM with good results. He exhibited good recall of his HEP. He reported that his ankle felt better upon the conclusion of treatment. Patient continues to require skilled physical therapy to address her  remaining impairments to return to her prior level of function.    Eval: Patient is a 53 y.o. male who was seen today for physical therapy evaluation and treatment for s/p ligament repair to RT ankle.  PT has decreased ROM, decreased strength, difficulty walking and increased pain in his Rt ankle.  This is the side his stroke affected, the pt states that he has had increased balance problems ever since his stroke.  As the pt is able to be WB without the [place] we will work on his balance but this might be prior level of function. Aaron Kennedy will benefit from skilled PT to address his deficits and maximize his functioning ability.   OBJECTIVE IMPAIRMENTS: cardiopulmonary status limiting activity, decreased activity tolerance, decreased balance, decreased mobility, difficulty walking, decreased ROM, and decreased strength.   ACTIVITY LIMITATIONS: carrying, lifting, standing, squatting, and locomotion level  PARTICIPATION LIMITATIONS: shopping, community activity, and yard work  PERSONAL FACTORS: 1 comorbidity: CVA are also affecting patient's functional outcome.   REHAB POTENTIAL: Good  CLINICAL DECISION MAKING: Stable/uncomplicated  EVALUATION COMPLEXITY: Moderate   GOALS: Goals reviewed with patient? Yes  SHORT TERM GOALS: Target date: 02/29/24 PT to be I in HEP to improve his ROM and strength of his Rt ankle for improved mobility  Baseline: Goal status: INITIAL  2.  PT to rt ankle pain to be no greater than a 5/10 Baseline:  Goal status: INITIAL  3.   PT LEFS score to be 26/80 Baseline:  Goal status: INITIAL  LONG TERM GOALS: Target date: 03/21/24  PT to be I in advanced HEP to improve his ROM and strength of his Rt ankle for improved mobility Baseline:  Goal status: INITIAL  2.   PT to rt ankle pain to be no greater than a 3/10 Baseline:  Goal status: INITIAL  3.  PT to be ambulating without his cam boot  Baseline:  Goal status: INITIAL  4.  PT LEFS score to be  30/80 Baseline:  Goal status: INITIAL   PLAN:  PT FREQUENCY: 2x/week  PT DURATION: 6 weeks  PLANNED INTERVENTIONS: 97110-Therapeutic exercises, 97530- Therapeutic activity, 97535- Self Care, [id number]- Manual therapy, and Patient/Family education  PLAN FOR NEXT SESSION: Concentrate on ROM to begin with; ankle alphabet, sitting heel slide, sitting baps....; progress to strength and balance   Lacinda Fass, PT, DPT  02/10/2024, 3:09 PM

## 2024-02-13 ENCOUNTER — Other Ambulatory Visit: Payer: Self-pay | Admitting: Nurse Practitioner

## 2024-02-13 DIAGNOSIS — Z1283 Encounter for screening for malignant neoplasm of skin: Secondary | ICD-10-CM

## 2024-02-13 NOTE — Telephone Encounter (Signed)
 Pt has a mass or bump on the side of his face. Please advise if referral is placed.

## 2024-02-14 ENCOUNTER — Ambulatory Visit (HOSPITAL_COMMUNITY)

## 2024-02-14 ENCOUNTER — Telehealth: Payer: Self-pay | Admitting: Lab

## 2024-02-14 ENCOUNTER — Other Ambulatory Visit: Payer: Self-pay | Admitting: Podiatry

## 2024-02-14 ENCOUNTER — Other Ambulatory Visit: Payer: Self-pay

## 2024-02-14 ENCOUNTER — Encounter (HOSPITAL_COMMUNITY): Payer: Self-pay

## 2024-02-14 DIAGNOSIS — M25571 Pain in right ankle and joints of right foot: Secondary | ICD-10-CM

## 2024-02-14 DIAGNOSIS — M958 Other specified acquired deformities of musculoskeletal system: Secondary | ICD-10-CM

## 2024-02-14 DIAGNOSIS — M25671 Stiffness of right ankle, not elsewhere classified: Secondary | ICD-10-CM

## 2024-02-14 DIAGNOSIS — M25371 Other instability, right ankle: Secondary | ICD-10-CM | POA: Diagnosis not present

## 2024-02-14 DIAGNOSIS — R262 Difficulty in walking, not elsewhere classified: Secondary | ICD-10-CM | POA: Diagnosis not present

## 2024-02-14 DIAGNOSIS — Z9889 Other specified postprocedural states: Secondary | ICD-10-CM | POA: Diagnosis not present

## 2024-02-14 MED ORDER — OXYCODONE HCL 5 MG PO TABS
5.0000 mg | ORAL_TABLET | Freq: Three times a day (TID) | ORAL | 0 refills | Status: AC | PRN
  Filled 2024-02-14: qty 15, 5d supply, fill #0

## 2024-02-14 NOTE — Telephone Encounter (Signed)
Pt advised Kh

## 2024-02-14 NOTE — Therapy (Signed)
 OUTPATIENT PHYSICAL THERAPY LOWER EXTREMITY TREATMENT   Patient Name: KARI KERTH MRN: [id number] DOB:08-08-1970, 53 y.o., male Today's Date: 02/14/2024  END OF SESSION:  PT End of Session - 02/14/24 0731     Visit Number 3    Number of Visits 12    Date for Recertification  03/21/24    Authorization Type healthy blue    Authorization Time Period 02/08/24-05/08/24    Authorization - Visit Number 2    Authorization - Number of Visits 13    Progress Note Due on Visit 10    PT Start Time 0730    PT Stop Time 0824    PT Time Calculation (min) 54 min    Activity Tolerance Patient tolerated treatment well    Behavior During Therapy [facility] for tasks assessed/performed            Past Medical History:  Diagnosis Date   Ankle instability, right    Anxiety    Arthritis    Chronic pain of right knee    DM type 2, controlled, with complication (HCC) 08/26/2021   Hypertension    Insomnia 01/2019   Osteochondral defect of talus    PAD (peripheral artery disease)    Peroneal tendinitis, right    Posterior tibial tendonitis, right    Stroke [facility])    right side weakness   Past Surgical History:  Procedure Laterality Date   ANKLE ARTHROSCOPY WITH DRILLING/MICROFRACTURE Right 01/06/2024   Procedure: ARTHROSCOPY, ANKLE, WITH MICROFRACTURE;  Surgeon: Medicine Juliene JONELLE, DPM;  Location: [facility] ORS;  Service: Orthopedics/Podiatry;  Laterality: Right;   EXCISION MASS LOWER EXTREMETIES Right 11/29/2023   Procedure: EXCISION MASS LOWER EXTREMITIES / excision of bone spur  right tibia;  Surgeon: Margrette Taft BRAVO, MD;  Location: [facility] ORS;  Service: Orthopedics;  Laterality: Right;   HERNIA REPAIR     INCISION AND DRAINAGE ABSCESS Left 07/21/2017   Procedure: INCISION AND DRAINAGE INGUINAL HERNIA ABSCESS;  Surgeon: Ebbie Cough, MD;  Location: [facility] OR;  Service: General;  Laterality: Left;   INGUINAL HERNIA REPAIR Bilateral 05/07/2017   Procedure: LAPAROSCOPIC RIGHT  INGUINAL HERNIA  REPAIR AND  OPEN LEFT   INGUINAL EXPLORATION;  Surgeon: Rubin Calamity, MD;  Location: [facility] OR;  Service: General;  Laterality: Bilateral;   INSERTION OF MESH Bilateral 05/07/2017   Procedure: INSERTION OF MESH;  Surgeon: Rubin Calamity, MD;  Location: [facility] OR;  Service: General;  Laterality: Bilateral;   LEG SURGERY Right    bone spur removed from foot   LIGAMENT REPAIR Right 01/06/2024   Procedure: REPAIR, LIGAMENT;  Surgeon: Medicine Juliene JONELLE, DPM;  Location: [facility] ORS;  Service: Orthopedics/Podiatry;  Laterality: Right;  GEN WITH POP BLOCK   ORCHIECTOMY Left 05/11/2023   Procedure: LEFT REMOVAL OF INGUINAL MESH;  Surgeon: Rubin Calamity, MD;  Location: [facility] ORS;  Service: General;  Laterality: Left;   Patient Active Problem List   Diagnosis Date Noted   Osteochondral defect of talus 01/07/2024   Tear of deltoid ligament of ankle, right, initial encounter 01/07/2024   Ankle instability, right 01/07/2024   Closed avulsion fracture of medial malleolus of right tibia 01/07/2024   Ganglion of right ankle 01/07/2024   Tenosynovitis of tibialis posterior tendon 01/07/2024   Osteochondroma of right tibia s/p excision 11/29/23 12/14/2023   Mass of tibia 11/29/2023   Skin wound from surgical incision 06/10/2022   DM type 2, controlled, with complication (HCC) 08/26/2021   Insomnia 06/23/2019   Anxiety 06/23/2019   Soft tissue abscess  of inguinal region 07/21/2017   Abscess 06/04/2017   Recurrent inguinal hernia 05/07/2017   S/P hernia repair 05/07/2017   Chronic pain of right knee 04/22/2016   PAD (peripheral artery disease) 05/02/2015   Stroke (cerebrum) (HCC) 04/08/2015   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   CVA (cerebral vascular accident) (HCC) 04/03/2015   Seizures (HCC) 04/03/2015   Stroke (HCC) 03/22/2015   HTN (hypertension) 03/22/2015   HLD (hyperlipidemia) 03/22/2015   Prediabetes 03/22/2015   Tobacco use disorder 03/22/2015   Cerebral infarction,  unspecified (HCC) 03/22/2015   [name] (transient ischemic attack) 03/18/2015   History of fracture of ankle    Right ankle pain     PCP: [name]  REFERRING PROVIDER: Silva Juliene SAUNDERS, DPM  REFERRING DIAG:  Diagnosis  [phone] (ICD-10-CM) - Post-operative state  M25.371 (ICD-10-CM) - Ankle instability, right    THERAPY DIAG:  Right ankle pain Right ankle stiffness Difficulty in walking.   Rationale for Evaluation and Treatment: Rehabilitation  ONSET DATE: 01/06/24  SUBJECTIVE:   SUBJECTIVE STATEMENT: Patient reports that he is hurting more today. He notes that it was hurting from his knee into his ankle. He is going to reach out to his surgeon regarding his increased pain.   Eval: PT states that he had multiple sprains in his Rt ankle due to years of basketball. He is currently putting full weight on his ankle with his cam boot.  PT states that his MD states that he is still not able to walk without the boot.    PERTINENT HISTORY: PT had Rt ankle arthroscopic surgery  on 9/19 for osteochondral defect of the talus and ligament repair.  Pt has a remote hx of CVA and seizures.   PAIN:  Are you having pain? Yes: Pain location: 9-10/10 Pain description: burning  Aggravating factors: activity  Relieving factors: rest and pain meds   PRECAUTIONS: Other: WB  RED FLAGS: None   WEIGHT BEARING RESTRICTIONS: Yes WBAT with boot   FALLS:  Has patient fallen in last 6 months? No  LIVING ENVIRONMENT: Lives with: lives with their family Lives in: House/apartment Stairs: Yes: External: 6 steps; on right going up not completing in a reciprocal    OCCUPATION: none  PLOF: Independent  PATIENT GOALS: To be able to be on his feet all day without pain   NEXT MD VISIT: 02/16/24  OBJECTIVE:  Note: Objective measures were completed at Evaluation unless otherwise noted.    PATIENT SURVEYS:  LEFS  Extreme difficulty/unable (0), Quite a bit of difficulty (1), Moderate  difficulty (2), Little difficulty (3), No difficulty (4) Survey date:  02/08/24  Any of your usual work, housework or school activities 1  2. Usual hobbies, recreational or sporting activities 0  3. Getting into/out of the bath 2  4. Walking between rooms 2  5. Putting on socks/shoes 1  6. Squatting  0  7. Lifting an object, like a bag of groceries from the floor 2  8. Performing light activities around your home 2  9. Performing heavy activities around your home 1  10. Getting into/out of a car 2  11. Walking 2 blocks 0  12. Walking 1 mile 0  13. Going up/down 10 stairs (1 flight) 2  14. Standing for 1 hour 1  15.  sitting for 1 hour 4  16. Running on even ground 0  17. Running on uneven ground 0  18. Making sharp turns while running fast 0  19. Hopping  0  20. Rolling over in bed 2  Score total:  22/80     COGNITION: Overall cognitive status: Within functional limits for tasks assessed   EDEMA:  Noted medial  PALPATION: Tender to palpation   LOWER EXTREMITY ROM:  Active ROM Right eval Left eval  Hip flexion    Hip extension    Hip abduction    Hip adduction    Hip internal rotation    Hip external rotation    Knee flexion    Knee extension    Ankle dorsiflexion - 8 12  Ankle plantarflexion 50 40  Ankle inversion 5 20  Ankle eversion 10 12   (Blank rows = not tested)  LOWER EXTREMITY MMT:  MMT Right eval Left eval  Hip flexion    Hip extension    Hip abduction    Hip adduction    Hip internal rotation    Hip external rotation    Knee flexion    Knee extension    Ankle dorsiflexion 4-/5   Ankle plantarflexion 4-   Ankle inversion 4-   Ankle eversion 3+    (Blank rows = not tested)  FUNCTIONAL TESTS:  2 minute walk test: 286 ft in 2 minutes.                                                                                                                                 TREATMENT DATE:                                    02/14/24 EXERCISE  LOG  Exercise Repetitions and Resistance Comments  Gastroc stretch 4 x 30 seconds  RLE in long sitting   Soleus stretch   4 x 30 seconds  RLE in long sitting   Ankle PF/DF and inversion/eversion   20 reps each  RLE only   BAPS L1 x 3 minutes each  DF/PF and inversion/eversion   Rocker board  3 minutes  With BUE support from parallel bars   Lunges onto step  20 reps  4 step; R foot on step   Nustep  L3 x 5 minutes    Blank cell = exercise not performed today                                    02/10/24 EXERCISE LOG  Exercise Repetitions and Resistance Comments  Patient education  See below    Manual therapy  STM: right gastrocnemius, achilles, and plantar fascia Joint mobilizations: grade I-III subtalar, talocrural, and midfoot PROM: all R ankle planes to tolerance   Ankle circles  20 reps each  CW and CCW  Ankle PF/DF  30 reps each    Ankle inversion/eversion 30 reps each    Nustep   L3 x  5.5 minutes @ seat 12    Blank cell = exercise not performed today   02/08/24 evaluation   Long sit:   Dorsiflexion AROM  10 reps - 10 hold -  Inversion Eversion A 10 reps - 10 hold -  Eversion AROM   10 reps - 10 hold - Supine Ankle Circles  - 10 reps   PATIENT EDUCATION:  Education details: healing, benefits of exercise, and pain management Person educated: Patient Education method: Explanation Education comprehension: verbalized understanding  HOME EXERCISE PROGRAM: Access Code: [email or web address] URL: [email or web address] Date: 02/08/2024 Prepared by: Montie Metro  Exercises - Long Sitting Ankle Dorsiflexion AROM  - 3 x daily - 7 x weekly - 1 sets - 10 reps - 10 hold - Supine Ankle Inversion Eversion AROM  - 3 x daily - 7 x weekly - 1 sets - 10 reps - 10 hold - Supine Ankle Eversion AROM  - 3 x daily - 7 x weekly - 1 sets - 10 reps - 10 hold - Supine Ankle Circles  - 3 x daily - 7 x weekly - 1 sets - 10 reps  ASSESSMENT:  CLINICAL IMPRESSION: Patient was  progressed with BAPS mobility to facilitate improved ankle neuromuscular control. He required minimal cueing with BAPS inversion and eversion for slow and controlled mobility to prevent hip internal and external rotation. He experienced a brief increase in pain with lunges onto the step, but this resolved as he continued with this intervention. He reported feeling sore upon the conclusion of treatment. Patient continues to require skilled physical therapy to address her remaining impairments to return to her prior level of function.    Eval: Patient is a 52 y.o. male who was seen today for physical therapy evaluation and treatment for s/p ligament repair to RT ankle.  PT has decreased ROM, decreased strength, difficulty walking and increased pain in his Rt ankle.  This is the side his stroke affected, the pt states that he has had increased balance problems ever since his stroke.  As the pt is able to be WB without the [place] we will work on his balance but this might be prior level of function. Mr. Golladay will benefit from skilled PT to address his deficits and maximize his functioning ability.   OBJECTIVE IMPAIRMENTS: cardiopulmonary status limiting activity, decreased activity tolerance, decreased balance, decreased mobility, difficulty walking, decreased ROM, and decreased strength.   ACTIVITY LIMITATIONS: carrying, lifting, standing, squatting, and locomotion level  PARTICIPATION LIMITATIONS: shopping, community activity, and yard work  PERSONAL FACTORS: 1 comorbidity: CVA are also affecting patient's functional outcome.   REHAB POTENTIAL: Good  CLINICAL DECISION MAKING: Stable/uncomplicated  EVALUATION COMPLEXITY: Moderate   GOALS: Goals reviewed with patient? Yes  SHORT TERM GOALS: Target date: 02/29/24 PT to be I in HEP to improve his ROM and strength of his Rt ankle for improved mobility  Baseline: Goal status: INITIAL  2.  PT to rt ankle pain to be no greater than a  5/10 Baseline:  Goal status: INITIAL  3.   PT LEFS score to be 26/80 Baseline:  Goal status: INITIAL  LONG TERM GOALS: Target date: 03/21/24  PT to be I in advanced HEP to improve his ROM and strength of his Rt ankle for improved mobility Baseline:  Goal status: INITIAL  2.   PT to rt ankle pain to be no greater than a 3/10 Baseline:  Goal status: INITIAL  3.  PT to be ambulating without his cam boot  Baseline:  Goal status: INITIAL  4.  PT LEFS score to be 30/80 Baseline:  Goal status: INITIAL   PLAN:  PT FREQUENCY: 2x/week  PT DURATION: 6 weeks  PLANNED INTERVENTIONS: 97110-Therapeutic exercises, 97530- Therapeutic activity, 97535- Self Care, [id number]- Manual therapy, and Patient/Family education  PLAN FOR NEXT SESSION: Concentrate on ROM to begin with; ankle alphabet, sitting heel slide, sitting baps....; progress to strength and balance   Lacinda Fass, PT, DPT  02/14/2024, 8:48 AM

## 2024-02-14 NOTE — Telephone Encounter (Signed)
 Patient is requesting pain medication states is going to physical therapy and is experiencing pain.

## 2024-02-16 ENCOUNTER — Ambulatory Visit (INDEPENDENT_AMBULATORY_CARE_PROVIDER_SITE_OTHER)

## 2024-02-16 ENCOUNTER — Ambulatory Visit (INDEPENDENT_AMBULATORY_CARE_PROVIDER_SITE_OTHER): Admitting: Podiatry

## 2024-02-16 VITALS — Ht 71.0 in | Wt 214.1 lb

## 2024-02-16 DIAGNOSIS — M25371 Other instability, right ankle: Secondary | ICD-10-CM | POA: Diagnosis not present

## 2024-02-16 DIAGNOSIS — M958 Other specified acquired deformities of musculoskeletal system: Secondary | ICD-10-CM

## 2024-02-16 NOTE — Progress Notes (Signed)
  Subjective:  Patient ID: Aaron Kennedy Medicine, male    DOB: [birth year],  MRN: [id number]  Chief Complaint  Patient presents with   Post-op Follow-up    RM 3 pov # 3 dos 01/06/24 rt ankle arthroplasty, ligament repairs, tendon repairs with PRP injection given at time of surgery( Aaron Kennedy pt)    53 y.o. male returns for post-op check.  Doing well pain is mostly after physical therapy and activity  Review of Systems: Negative except as noted in the HPI. Denies N/V/F/Ch.   Objective:  There were no vitals filed for this visit. Body mass index is 29.86 kg/m. Constitutional Well developed. Well nourished.  Vascular Foot warm and well perfused. Capillary refill normal to all digits.  Calf is soft and supple, no posterior calf or knee pain, negative Homans' sign  Neurologic Normal speech. Oriented to person, place, and time. Epicritic sensation to light touch grossly present bilaterally.  Dermatologic His incisions are well-healed and not hypertrophic  Orthopedic: Mild tenderness on the medial incision, good range of motion, good stability   Multiple view plain film radiographs: Alignment is maintained no new concerning degeneration Assessment:   1. Ankle instability, right   2. Osteochondral defect of talus    Plan:  Patient was evaluated and treated and all questions answered.  S/p foot surgery right -Progressing as expected post-operatively. -XR: Noted above no issues -WB Status: Transition to gradual weightbearing in lace up ankle brace with compression sleeve these were dispensed today, can discontinue cam boot, okay to drive  - Continue physical therapy.  Follow-up in 6 weeks  No follow-ups on file.

## 2024-02-17 ENCOUNTER — Encounter (HOSPITAL_COMMUNITY): Payer: Self-pay

## 2024-02-17 ENCOUNTER — Ambulatory Visit (HOSPITAL_COMMUNITY)

## 2024-02-20 ENCOUNTER — Encounter: Payer: Self-pay | Admitting: Radiology

## 2024-02-21 ENCOUNTER — Encounter (HOSPITAL_COMMUNITY): Payer: Self-pay

## 2024-02-21 ENCOUNTER — Ambulatory Visit (HOSPITAL_COMMUNITY): Attending: Podiatry

## 2024-02-21 DIAGNOSIS — M25671 Stiffness of right ankle, not elsewhere classified: Secondary | ICD-10-CM | POA: Diagnosis not present

## 2024-02-21 DIAGNOSIS — M25571 Pain in right ankle and joints of right foot: Secondary | ICD-10-CM | POA: Diagnosis not present

## 2024-02-21 DIAGNOSIS — R262 Difficulty in walking, not elsewhere classified: Secondary | ICD-10-CM | POA: Insufficient documentation

## 2024-02-21 DIAGNOSIS — R278 Other lack of coordination: Secondary | ICD-10-CM | POA: Diagnosis not present

## 2024-02-21 NOTE — Therapy (Signed)
 OUTPATIENT PHYSICAL THERAPY LOWER EXTREMITY TREATMENT   Patient Name: Aaron Kennedy MRN: [id number] DOB:13-Jun-1970, 53 y.o., male Today's Date: 02/21/2024  END OF SESSION:  PT End of Session - 02/21/24 1549     Visit Number 4    Number of Visits 12    Date for Recertification  03/21/24    Authorization Type healthy blue    Authorization Time Period 02/08/24-05/08/24    Authorization - Visit Number 3    Authorization - Number of Visits 13    Progress Note Due on Visit 10    PT Start Time 1549   late arrival   PT Stop Time 1627    PT Time Calculation (min) 38 min    Activity Tolerance Patient tolerated treatment well    Behavior During Therapy [facility] for tasks assessed/performed            Past Medical History:  Diagnosis Date   Ankle instability, right    Anxiety    Arthritis    Chronic pain of right knee    DM type 2, controlled, with complication (HCC) 08/26/2021   Hypertension    Insomnia 01/2019   Osteochondral defect of talus    PAD (peripheral artery disease)    Peroneal tendinitis, right    Posterior tibial tendonitis, right    Stroke (HCC)    right side weakness   Past Surgical History:  Procedure Laterality Date   ANKLE ARTHROSCOPY WITH DRILLING/MICROFRACTURE Right 01/06/2024   Procedure: ARTHROSCOPY, ANKLE, WITH MICROFRACTURE;  Surgeon: Medicine Juliene JONELLE, DPM;  Location: [facility] ORS;  Service: Orthopedics/Podiatry;  Laterality: Right;   EXCISION MASS LOWER EXTREMETIES Right 11/29/2023   Procedure: EXCISION MASS LOWER EXTREMITIES / excision of bone spur  right tibia;  Surgeon: Margrette Taft BRAVO, MD;  Location: [facility];  Service: Orthopedics;  Laterality: Right;   HERNIA REPAIR     INCISION AND DRAINAGE ABSCESS Left 07/21/2017   Procedure: INCISION AND DRAINAGE INGUINAL HERNIA ABSCESS;  Surgeon: Ebbie Cough, MD;  Location: [facility] OR;  Service: General;  Laterality: Left;   INGUINAL HERNIA REPAIR Bilateral 05/07/2017   Procedure: LAPAROSCOPIC RIGHT  INGUINAL  HERNIA REPAIR AND  OPEN LEFT   INGUINAL EXPLORATION;  Surgeon: Rubin Calamity, MD;  Location: [facility] OR;  Service: General;  Laterality: Bilateral;   INSERTION OF MESH Bilateral 05/07/2017   Procedure: INSERTION OF MESH;  Surgeon: Rubin Calamity, MD;  Location: [facility] OR;  Service: General;  Laterality: Bilateral;   LEG SURGERY Right    bone spur removed from foot   LIGAMENT REPAIR Right 01/06/2024   Procedure: REPAIR, LIGAMENT;  Surgeon: Medicine Juliene JONELLE, DPM;  Location: [facility] OR[facility];  Service: Orthopedics/Podiatry;  Laterality: Right;  GEN WITH POP BLOCK   ORCHIECTOMY Left 05/11/2023   Procedure: LEFT REMOVAL OF INGUINAL MESH;  Surgeon: Rubin Calamity, MD;  Location: [facility];  Service: General;  Laterality: Left;   Patient Active Problem List   Diagnosis Date Noted   Osteochondral defect of talus 01/07/2024   Tear of deltoid ligament of ankle, right, initial encounter 01/07/2024   Ankle instability, right 01/07/2024   Closed avulsion fracture of medial malleolus of right tibia 01/07/2024   Ganglion of right ankle 01/07/2024   Tenosynovitis of tibialis posterior tendon 01/07/2024   Osteochondroma of right tibia s/p excision 11/29/23 12/14/2023   Mass of tibia 11/29/2023   Skin wound from surgical incision 06/10/2022   DM type 2, controlled, with complication (HCC) 08/26/2021   Insomnia 06/23/2019   Anxiety 06/23/2019  Soft tissue abscess of inguinal region 07/21/2017   Abscess 06/04/2017   Recurrent inguinal hernia 05/07/2017   S/P hernia repair 05/07/2017   Chronic pain of right knee 04/22/2016   PAD (peripheral artery disease) 05/02/2015   Stroke (cerebrum) (HCC) 04/08/2015   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   CVA (cerebral vascular accident) (HCC) 04/03/2015   Seizures (HCC) 04/03/2015   Stroke (HCC) 03/22/2015   HTN (hypertension) 03/22/2015   HLD (hyperlipidemia) 03/22/2015   Prediabetes 03/22/2015   Tobacco use disorder 03/22/2015   Cerebral  infarction, unspecified (HCC) 03/22/2015   [name] (transient ischemic attack) 03/18/2015   History of fracture of ankle    Right ankle pain     PCP: [name]  REFERRING PROVIDER: Silva Juliene SAUNDERS, DPM  REFERRING DIAG:  Diagnosis  [phone] (ICD-10-CM) - Post-operative state  M25.371 (ICD-10-CM) - Ankle instability, right    THERAPY DIAG:  Right ankle pain Right ankle stiffness Difficulty in walking.   Rationale for Evaluation and Treatment: Rehabilitation  ONSET DATE: 01/06/24  SUBJECTIVE:   SUBJECTIVE STATEMENT: Patient reports the ankle is feeling good, got a good report from post op appointment. Was released from boot. Pt states he feels he has been able to lift his toes up more in the past few days.    Eval: PT states that he had multiple sprains in his Rt ankle due to years of basketball. He is currently putting full weight on his ankle with his cam boot.  PT states that his MD states that he is still not able to walk without the boot.    PERTINENT HISTORY: PT had Rt ankle arthroscopic surgery  on 9/19 for osteochondral defect of the talus and ligament repair.  Pt has a remote hx of CVA and seizures.   PAIN:  Are you having pain? Yes: Pain location: 9-10/10 Pain description: burning  Aggravating factors: activity  Relieving factors: rest and pain meds   PRECAUTIONS: Other: WB  RED FLAGS: None   WEIGHT BEARING RESTRICTIONS: Yes WBAT with boot   FALLS:  Has patient fallen in last 6 months? No  LIVING ENVIRONMENT: Lives with: lives with their family Lives in: House/apartment Stairs: Yes: External: 6 steps; on right going up not completing in a reciprocal    OCCUPATION: none  PLOF: Independent  PATIENT GOALS: To be able to be on his feet all day without pain   NEXT MD VISIT: 02/16/24  OBJECTIVE:  Note: Objective measures were completed at Evaluation unless otherwise noted.    PATIENT SURVEYS:  LEFS  Extreme difficulty/unable (0), Quite a bit of  difficulty (1), Moderate difficulty (2), Little difficulty (3), No difficulty (4) Survey date:  02/08/24  Any of your usual work, housework or school activities 1  2. Usual hobbies, recreational or sporting activities 0  3. Getting into/out of the bath 2  4. Walking between rooms 2  5. Putting on socks/shoes 1  6. Squatting  0  7. Lifting an object, like a bag of groceries from the floor 2  8. Performing light activities around your home 2  9. Performing heavy activities around your home 1  10. Getting into/out of a car 2  11. Walking 2 blocks 0  12. Walking 1 mile 0  13. Going up/down 10 stairs (1 flight) 2  14. Standing for 1 hour 1  15.  sitting for 1 hour 4  16. Running on even ground 0  17. Running on uneven ground 0  18. Making sharp  turns while running fast 0  19. Hopping  0  20. Rolling over in bed 2  Score total:  22/80     COGNITION: Overall cognitive status: Within functional limits for tasks assessed   EDEMA:  Noted medial  PALPATION: Tender to palpation   LOWER EXTREMITY ROM:  Active ROM Right eval Left eval  Hip flexion    Hip extension    Hip abduction    Hip adduction    Hip internal rotation    Hip external rotation    Knee flexion    Knee extension    Ankle dorsiflexion - 8 12  Ankle plantarflexion 50 40  Ankle inversion 5 20  Ankle eversion 10 12   (Blank rows = not tested)  LOWER EXTREMITY MMT:  MMT Right eval Left eval  Hip flexion    Hip extension    Hip abduction    Hip adduction    Hip internal rotation    Hip external rotation    Knee flexion    Knee extension    Ankle dorsiflexion 4-/5   Ankle plantarflexion 4-   Ankle inversion 4-   Ankle eversion 3+    (Blank rows = not tested)  FUNCTIONAL TESTS:  2 minute walk test: 286 ft in 2 minutes.                                                                                                                                 TREATMENT DATE:  02/21/2024  Therapeutic  Exercise: -Stationary bike, 5 minutes, level 2 resistance, pt cued for 50-60 -DF/PF and IN/EV on rocker board, 2 sets of 10 reps bilaterally, pt cued for decreased UE support -Soleus stretch, on 8 inch step, RLE only, 2 bouts of 30 seconds -Gastroc stretch on incline, 30 second holds, pt cued for pain free stretch Neuromuscular Re-education: -Dorsiflexion marches, 5lb kettle bell from 6 inch step, 1 set of 8 reps bilaterally, pt cued for increased dorsiflexion -Baps board, 15 taps each direction, all planes, and 10 circles each direction, pt cued for lateral ankle muscle activation -Staggered stance trampoline toss (NBOS, one LE on floor other on 6 inch step, NBOS on blue foam), 1 sets of 10 throws each variation Therapeutic Activity: -Lateral step up and overs, 1 sets of 5 reps, 6 inch step pt cued for core activation                                    02/14/24 EXERCISE LOG  Exercise Repetitions and Resistance Comments  Gastroc stretch 4 x 30 seconds  RLE in long sitting   Soleus stretch   4 x 30 seconds  RLE in long sitting   Ankle PF/DF and inversion/eversion   20 reps each  RLE only   BAPS L1 x 3 minutes each  DF/PF and inversion/eversion   Rocker board  3 minutes  With BUE support from parallel bars   Lunges onto step  20 reps  4 step; R foot on step   Nustep  L3 x 5 minutes    Blank cell = exercise not performed today                                    02/10/24 EXERCISE LOG  Exercise Repetitions and Resistance Comments  Patient education  See below    Manual therapy  STM: right gastrocnemius, achilles, and plantar fascia Joint mobilizations: grade I-III subtalar, talocrural, and midfoot PROM: all R ankle planes to tolerance   Ankle circles  20 reps each  CW and CCW  Ankle PF/DF  30 reps each    Ankle inversion/eversion 30 reps each    Nustep   L3 x 5.5 minutes @ seat 12    Blank cell = exercise not performed today    PATIENT EDUCATION:  Education details: healing,  benefits of exercise, and pain management Person educated: Patient Education method: Explanation Education comprehension: verbalized understanding  HOME EXERCISE PROGRAM: Access Code: [email or web address] URL: [email or web address] Date: 02/08/2024 Prepared by: Montie Metro  Exercises - Long Sitting Ankle Dorsiflexion AROM  - 3 x daily - 7 x weekly - 1 sets - 10 reps - 10 hold - Supine Ankle Inversion Eversion AROM  - 3 x daily - 7 x weekly - 1 sets - 10 reps - 10 hold - Supine Ankle Eversion AROM  - 3 x daily - 7 x weekly - 1 sets - 10 reps - 10 hold - Supine Ankle Circles  - 3 x daily - 7 x weekly - 1 sets - 10 reps  ASSESSMENT:  CLINICAL IMPRESSION: Patient continues to demonstrate decreased RLE strength/ROM, decreased gait quality and fair balance. Patient also demonstrates fair endurance with aerobic based exercise during today's session. Patient able to progress dynamic balance and core activation exercises today with step up variation and trampoline toss, good performance with verbal cueing. Patient would continue to benefit from skilled physical therapy for decreased pain in RLE increased endurance with ambulation, increased LE strength/ROM, and improved balance for improved quality of life, improved independence with RLE management and continued progress towards therapy goals.     Eval: Patient is a 53 y.o. male who was seen today for physical therapy evaluation and treatment for s/p ligament repair to RT ankle.  PT has decreased ROM, decreased strength, difficulty walking and increased pain in his Rt ankle.  This is the side his stroke affected, the pt states that he has had increased balance problems ever since his stroke.  As the pt is able to be WB without the [place] we will work on his balance but this might be prior level of function. Mr. Brooking will benefit from skilled PT to address his deficits and maximize his functioning ability.   OBJECTIVE IMPAIRMENTS:  cardiopulmonary status limiting activity, decreased activity tolerance, decreased balance, decreased mobility, difficulty walking, decreased ROM, and decreased strength.   ACTIVITY LIMITATIONS: carrying, lifting, standing, squatting, and locomotion level  PARTICIPATION LIMITATIONS: shopping, community activity, and yard work  PERSONAL FACTORS: 1 comorbidity: CVA are also affecting patient's functional outcome.   REHAB POTENTIAL: Good  CLINICAL DECISION MAKING: Stable/uncomplicated  EVALUATION COMPLEXITY: Moderate   GOALS: Goals reviewed with patient? Yes  SHORT TERM GOALS: Target date: 02/29/24 PT to be I in HEP to improve his ROM and strength  of his Rt ankle for improved mobility  Baseline: Goal status: INITIAL  2.  PT to rt ankle pain to be no greater than a 5/10 Baseline:  Goal status: INITIAL  3.   PT LEFS score to be 26/80 Baseline:  Goal status: INITIAL  LONG TERM GOALS: Target date: 03/21/24  PT to be I in advanced HEP to improve his ROM and strength of his Rt ankle for improved mobility Baseline:  Goal status: INITIAL  2.   PT to rt ankle pain to be no greater than a 3/10 Baseline:  Goal status: INITIAL  3.  PT to be ambulating without his cam boot  Baseline:  Goal status: INITIAL  4.  PT LEFS score to be 30/80 Baseline:  Goal status: INITIAL   PLAN:  PT FREQUENCY: 2x/week  PT DURATION: 6 weeks  PLANNED INTERVENTIONS: 97110-Therapeutic exercises, 97530- Therapeutic activity, 97535- Self Care, [id number]- Manual therapy, and Patient/Family education  PLAN FOR NEXT SESSION: Concentrate on ROM to begin with; ankle alphabet, sitting heel slide, sitting baps....; progress to strength and balance   Lang Ada, PT, DPT [facility] Office: [phone] 4:30 PM, 02/21/24

## 2024-02-23 ENCOUNTER — Ambulatory Visit (HOSPITAL_COMMUNITY)

## 2024-02-23 ENCOUNTER — Encounter (HOSPITAL_COMMUNITY): Payer: Self-pay

## 2024-02-28 ENCOUNTER — Ambulatory Visit (HOSPITAL_COMMUNITY)

## 2024-02-28 ENCOUNTER — Encounter (HOSPITAL_COMMUNITY): Payer: Self-pay

## 2024-02-28 DIAGNOSIS — M25571 Pain in right ankle and joints of right foot: Secondary | ICD-10-CM

## 2024-02-28 DIAGNOSIS — R278 Other lack of coordination: Secondary | ICD-10-CM | POA: Diagnosis not present

## 2024-02-28 DIAGNOSIS — R262 Difficulty in walking, not elsewhere classified: Secondary | ICD-10-CM

## 2024-02-28 DIAGNOSIS — M25671 Stiffness of right ankle, not elsewhere classified: Secondary | ICD-10-CM | POA: Diagnosis not present

## 2024-02-28 NOTE — Therapy (Signed)
 OUTPATIENT PHYSICAL THERAPY LOWER EXTREMITY TREATMENT   Patient Name: Aaron Kennedy MRN: [id number] DOB:02-09-1971, 53 y.o., male Today's Date: 02/28/2024  END OF SESSION:  PT End of Session - 02/28/24 0815     Visit Number 5    Number of Visits 12    Date for Recertification  03/21/24    Authorization Type healthy blue    Authorization Time Period 02/08/24-05/08/24    Authorization - Visit Number 4    Authorization - Number of Visits 13    Progress Note Due on Visit 10    PT Start Time 0815    PT Stop Time 0855    PT Time Calculation (min) 40 min    Activity Tolerance Patient tolerated treatment well    Behavior During Therapy [facility] for tasks assessed/performed             Past Medical History:  Diagnosis Date   Ankle instability, right    Anxiety    Arthritis    Chronic pain of right knee    DM type 2, controlled, with complication (HCC) 08/26/2021   Hypertension    Insomnia 01/2019   Osteochondral defect of talus    PAD (peripheral artery disease)    Peroneal tendinitis, right    Posterior tibial tendonitis, right    Stroke [facility])    right side weakness   Past Surgical History:  Procedure Laterality Date   ANKLE ARTHROSCOPY WITH DRILLING/MICROFRACTURE Right 01/06/2024   Procedure: ARTHROSCOPY, ANKLE, WITH MICROFRACTURE;  Surgeon: Medicine Juliene JONELLE, DPM;  Location: [facility] ORS;  Service: Orthopedics/Podiatry;  Laterality: Right;   EXCISION MASS LOWER EXTREMETIES Right 11/29/2023   Procedure: EXCISION MASS LOWER EXTREMITIES / excision of bone spur  right tibia;  Surgeon: Margrette Taft BRAVO, MD;  Location: [facility] OR[facility];  Service: Orthopedics;  Laterality: Right;   HERNIA REPAIR     INCISION AND DRAINAGE ABSCESS Left 07/21/2017   Procedure: INCISION AND DRAINAGE INGUINAL HERNIA ABSCESS;  Surgeon: Ebbie Cough, MD;  Location: [facility] OR;  Service: General;  Laterality: Left;   INGUINAL HERNIA REPAIR Bilateral 05/07/2017   Procedure: LAPAROSCOPIC RIGHT  INGUINAL HERNIA  REPAIR AND  OPEN LEFT   INGUINAL EXPLORATION;  Surgeon: Rubin Calamity, MD;  Location: [facility] OR;  Service: General;  Laterality: Bilateral;   INSERTION OF MESH Bilateral 05/07/2017   Procedure: INSERTION OF MESH;  Surgeon: Rubin Calamity, MD;  Location: [facility] OR;  Service: General;  Laterality: Bilateral;   LEG SURGERY Right    bone spur removed from foot   LIGAMENT REPAIR Right 01/06/2024   Procedure: REPAIR, LIGAMENT;  Surgeon: Medicine Juliene JONELLE, DPM;  Location: [facility] ORS;  Service: Orthopedics/Podiatry;  Laterality: Right;  GEN WITH POP BLOCK   ORCHIECTOMY Left 05/11/2023   Procedure: LEFT REMOVAL OF INGUINAL MESH;  Surgeon: Rubin Calamity, MD;  Location: [facility] ORS;  Service: General;  Laterality: Left;   Patient Active Problem List   Diagnosis Date Noted   Osteochondral defect of talus 01/07/2024   Tear of deltoid ligament of ankle, right, initial encounter 01/07/2024   Ankle instability, right 01/07/2024   Closed avulsion fracture of medial malleolus of right tibia 01/07/2024   Ganglion of right ankle 01/07/2024   Tenosynovitis of tibialis posterior tendon 01/07/2024   Osteochondroma of right tibia s/p excision 11/29/23 12/14/2023   Mass of tibia 11/29/2023   Skin wound from surgical incision 06/10/2022   DM type 2, controlled, with complication (HCC) 08/26/2021   Insomnia 06/23/2019   Anxiety 06/23/2019   Soft tissue  abscess of inguinal region 07/21/2017   Abscess 06/04/2017   Recurrent inguinal hernia 05/07/2017   S/P hernia repair 05/07/2017   Chronic pain of right knee 04/22/2016   PAD (peripheral artery disease) 05/02/2015   Stroke (cerebrum) (HCC) 04/08/2015   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   CVA (cerebral vascular accident) (HCC) 04/03/2015   Seizures (HCC) 04/03/2015   Stroke (HCC) 03/22/2015   HTN (hypertension) 03/22/2015   HLD (hyperlipidemia) 03/22/2015   Prediabetes 03/22/2015   Tobacco use disorder 03/22/2015   Cerebral infarction,  unspecified (HCC) 03/22/2015   [name] (transient ischemic attack) 03/18/2015   History of fracture of ankle    Right ankle pain     PCP: [name]  REFERRING PROVIDER: Silva Juliene SAUNDERS, DPM  REFERRING DIAG:  Diagnosis  [phone] (ICD-10-CM) - Post-operative state  M25.371 (ICD-10-CM) - Ankle instability, right    THERAPY DIAG:  Right ankle pain Right ankle stiffness Difficulty in walking.   Rationale for Evaluation and Treatment: Rehabilitation  ONSET DATE: 01/06/24  SUBJECTIVE:   SUBJECTIVE STATEMENT: Patient reports that he is hurting a little today. He is also having some stinging. He didn't feel too bad after his last appointment.   Eval: PT states that he had multiple sprains in his Rt ankle due to years of basketball. He is currently putting full weight on his ankle with his cam boot.  PT states that his MD states that he is still not able to walk without the boot.    PERTINENT HISTORY: PT had Rt ankle arthroscopic surgery  on 9/19 for osteochondral defect of the talus and ligament repair.  Pt has a remote hx of CVA and seizures.   PAIN:  Are you having pain? Yes: Pain location: 9-10/10 Pain description: burning  Aggravating factors: activity  Relieving factors: rest and pain meds   PRECAUTIONS: Other: WB  RED FLAGS: None   WEIGHT BEARING RESTRICTIONS: Yes WBAT with boot   FALLS:  Has patient fallen in last 6 months? No  LIVING ENVIRONMENT: Lives with: lives with their family Lives in: House/apartment Stairs: Yes: External: 6 steps; on right going up not completing in a reciprocal    OCCUPATION: none  PLOF: Independent  PATIENT GOALS: To be able to be on his feet all day without pain   NEXT MD VISIT: 02/16/24  OBJECTIVE:  Note: Objective measures were completed at Evaluation unless otherwise noted.    PATIENT SURVEYS:  LEFS  Extreme difficulty/unable (0), Quite a bit of difficulty (1), Moderate difficulty (2), Little difficulty (3), No  difficulty (4) Survey date:  02/08/24  Any of your usual work, housework or school activities 1  2. Usual hobbies, recreational or sporting activities 0  3. Getting into/out of the bath 2  4. Walking between rooms 2  5. Putting on socks/shoes 1  6. Squatting  0  7. Lifting an object, like a bag of groceries from the floor 2  8. Performing light activities around your home 2  9. Performing heavy activities around your home 1  10. Getting into/out of a car 2  11. Walking 2 blocks 0  12. Walking 1 mile 0  13. Going up/down 10 stairs (1 flight) 2  14. Standing for 1 hour 1  15.  sitting for 1 hour 4  16. Running on even ground 0  17. Running on uneven ground 0  18. Making sharp turns while running fast 0  19. Hopping  0  20. Rolling over in bed 2  Score total:  22/80     COGNITION: Overall cognitive status: Within functional limits for tasks assessed   EDEMA:  Noted medial  PALPATION: Tender to palpation   LOWER EXTREMITY ROM:  Active ROM Right eval Left eval  Hip flexion    Hip extension    Hip abduction    Hip adduction    Hip internal rotation    Hip external rotation    Knee flexion    Knee extension    Ankle dorsiflexion - 8 12  Ankle plantarflexion 50 40  Ankle inversion 5 20  Ankle eversion 10 12   (Blank rows = not tested)  LOWER EXTREMITY MMT:  MMT Right eval Left eval  Hip flexion    Hip extension    Hip abduction    Hip adduction    Hip internal rotation    Hip external rotation    Knee flexion    Knee extension    Ankle dorsiflexion 4-/5   Ankle plantarflexion 4-   Ankle inversion 4-   Ankle eversion 3+    (Blank rows = not tested)  FUNCTIONAL TESTS:  2 minute walk test: 286 ft in 2 minutes.                                                                                                                                 TREATMENT DATE:                                    02/28/24 EXERCISE LOG  Exercise Repetitions and Resistance  Comments  Bike  L3 x 5 minutes    Standing gastroc stretch   4 x 30 seconds  Resolved all burning in the ankle and calf   Tandem stance on foam   3 x 30 seconds each  Intermittent UE support   Lunges onto BOSU   15 reps each w/ 10 second hold  Ball up; BUE support from parallel bars   Stairs  3 x 4 steps  Reciprocal pattern; increased R hip ABD   Standing heel raise  15 reps  With BUE support from parallel bars   BAPS  L2 x 15 reps each  PF, DF, inversion, and eversion       Blank cell = exercise not performed today   02/21/2024  Therapeutic Exercise: -Stationary bike, 5 minutes, level 2 resistance, pt cued for 50-60 -DF/PF and IN/EV on rocker board, 2 sets of 10 reps bilaterally, pt cued for decreased UE support -Soleus stretch, on 8 inch step, RLE only, 2 bouts of 30 seconds -Gastroc stretch on incline, 30 second holds, pt cued for pain free stretch Neuromuscular Re-education: -Dorsiflexion marches, 5lb kettle bell from 6 inch step, 1 set of 8 reps bilaterally, pt cued for increased dorsiflexion -Baps board, 15 taps each direction, all planes, and 10 circles each direction, pt  cued for lateral ankle muscle activation -Staggered stance trampoline toss (NBOS, one LE on floor other on 6 inch step, NBOS on blue foam), 1 sets of 10 throws each variation Therapeutic Activity: -Lateral step up and overs, 1 sets of 5 reps, 6 inch step pt cued for core activation                                    02/14/24 EXERCISE LOG  Exercise Repetitions and Resistance Comments  Gastroc stretch 4 x 30 seconds  RLE in long sitting   Soleus stretch   4 x 30 seconds  RLE in long sitting   Ankle PF/DF and inversion/eversion   20 reps each  RLE only   BAPS L1 x 3 minutes each  DF/PF and inversion/eversion   Rocker board  3 minutes  With BUE support from parallel bars   Lunges onto step  20 reps  4 step; R foot on step   Nustep  L3 x 5 minutes    Blank cell = exercise not performed today                                     02/10/24 EXERCISE LOG  Exercise Repetitions and Resistance Comments  Patient education  See below    Manual therapy  STM: right gastrocnemius, achilles, and plantar fascia Joint mobilizations: grade I-III subtalar, talocrural, and midfoot PROM: all R ankle planes to tolerance   Ankle circles  20 reps each  CW and CCW  Ankle PF/DF  30 reps each    Ankle inversion/eversion 30 reps each    Nustep   L3 x 5.5 minutes @ seat 12    Blank cell = exercise not performed today    PATIENT EDUCATION:  Education details: healing, importance of his HEP, and goals for physical therapy Person educated: Patient Education method: Explanation Education comprehension: verbalized understanding  HOME EXERCISE PROGRAM: Access Code: [email or web address] URL: [email or web address] Date: 02/08/2024 Prepared by: Montie Metro  Exercises - Long Sitting Ankle Dorsiflexion AROM  - 3 x daily - 7 x weekly - 1 sets - 10 reps - 10 hold - Supine Ankle Inversion Eversion AROM  - 3 x daily - 7 x weekly - 1 sets - 10 reps - 10 hold - Supine Ankle Eversion AROM  - 3 x daily - 7 x weekly - 1 sets - 10 reps - 10 hold - Supine Ankle Circles  - 3 x daily - 7 x weekly - 1 sets - 10 reps  ASSESSMENT:  CLINICAL IMPRESSION: Patient was progressed with new and familiar interventions for improved ankle stability needed for functional activities. He required minimal cueing with today's BAPS interventions to limit hip mobility to isolate ankle mobility. He presented with burning in his calves bilaterally, but this was able to be completely resolved with a standing gastrocnemius stretch. He reported that his ankles felt sore upon the conclusion of treatment. Patient continues to require skilled physical therapy to address her remaining impairments to return to her prior level of function.    Eval: Patient is a 53 y.o. male who was seen today for physical therapy evaluation and treatment for s/p ligament  repair to RT ankle.  PT has decreased ROM, decreased strength, difficulty walking and increased pain in his Rt ankle.  This is the side his stroke  affected, the pt states that he has had increased balance problems ever since his stroke.  As the pt is able to be WB without the [place] we will work on his balance but this might be prior level of function. Aaron Kennedy will benefit from skilled PT to address his deficits and maximize his functioning ability.   OBJECTIVE IMPAIRMENTS: cardiopulmonary status limiting activity, decreased activity tolerance, decreased balance, decreased mobility, difficulty walking, decreased ROM, and decreased strength.   ACTIVITY LIMITATIONS: carrying, lifting, standing, squatting, and locomotion level  PARTICIPATION LIMITATIONS: shopping, community activity, and yard work  PERSONAL FACTORS: 1 comorbidity: CVA are also affecting patient's functional outcome.   REHAB POTENTIAL: Good  CLINICAL DECISION MAKING: Stable/uncomplicated  EVALUATION COMPLEXITY: Moderate   GOALS: Goals reviewed with patient? Yes  SHORT TERM GOALS: Target date: 02/29/24 PT to be I in HEP to improve his ROM and strength of his Rt ankle for improved mobility  Baseline: Goal status: INITIAL  2.  PT to rt ankle pain to be no greater than a 5/10 Baseline:  Goal status: INITIAL  3.   PT LEFS score to be 26/80 Baseline:  Goal status: INITIAL  LONG TERM GOALS: Target date: 03/21/24  PT to be I in advanced HEP to improve his ROM and strength of his Rt ankle for improved mobility Baseline:  Goal status: INITIAL  2.   PT to rt ankle pain to be no greater than a 3/10 Baseline:  Goal status: INITIAL  3.  PT to be ambulating without his cam boot  Baseline:  Goal status: INITIAL  4.  PT LEFS score to be 30/80 Baseline:  Goal status: INITIAL   PLAN:  PT FREQUENCY: 2x/week  PT DURATION: 6 weeks  PLANNED INTERVENTIONS: 97110-Therapeutic exercises, 97530- Therapeutic activity,  97535- Self Care, [id number]- Manual therapy, and Patient/Family education  PLAN FOR NEXT SESSION: Concentrate on ROM to begin with; ankle alphabet, sitting heel slide, sitting baps....; progress to strength and balance   Lacinda Fass, PT, DPT  8:59 AM, 02/28/24

## 2024-03-02 ENCOUNTER — Telehealth (HOSPITAL_COMMUNITY): Payer: Self-pay

## 2024-03-02 ENCOUNTER — Ambulatory Visit (HOSPITAL_COMMUNITY)

## 2024-03-02 NOTE — Telephone Encounter (Signed)
 Pt was called when he no-showed his appointment today. PT spoke with patient and he reported that he accidentally overslept and missed his alarm. He confirmed his next appointment and apologized for missing his appointment. This was his first no-show.   Lacinda Fass, PT, DPT

## 2024-03-05 ENCOUNTER — Ambulatory Visit (HOSPITAL_COMMUNITY): Admitting: Physical Therapy

## 2024-03-05 DIAGNOSIS — M25671 Stiffness of right ankle, not elsewhere classified: Secondary | ICD-10-CM

## 2024-03-05 DIAGNOSIS — M25571 Pain in right ankle and joints of right foot: Secondary | ICD-10-CM

## 2024-03-05 DIAGNOSIS — R278 Other lack of coordination: Secondary | ICD-10-CM

## 2024-03-05 DIAGNOSIS — R262 Difficulty in walking, not elsewhere classified: Secondary | ICD-10-CM

## 2024-03-05 NOTE — Therapy (Signed)
 OUTPATIENT PHYSICAL THERAPY LOWER EXTREMITY TREATMENT   Patient Name: Aaron Kennedy MRN: [id number] DOB:04-Jun-1970, 53 y.o., male Today's Date: 03/05/2024  END OF SESSION:  PT End of Session - 03/05/24 1004     Visit Number 6    Number of Visits 12    Date for Recertification  03/21/24    Authorization Type healthy blue    Authorization Time Period 02/08/24-05/08/24    Authorization - Visit Number 5    Authorization - Number of Visits 13    Progress Note Due on Visit 10    PT Start Time 0820    PT Stop Time 0900    PT Time Calculation (min) 40 min    Activity Tolerance Patient tolerated treatment well    Behavior During Therapy [facility] for tasks assessed/performed              Past Medical History:  Diagnosis Date   Ankle instability, right    Anxiety    Arthritis    Chronic pain of right knee    DM type 2, controlled, with complication (HCC) 08/26/2021   Hypertension    Insomnia 01/2019   Osteochondral defect of talus    PAD (peripheral artery disease)    Peroneal tendinitis, right    Posterior tibial tendonitis, right    Stroke (HCC)    right side weakness   Past Surgical History:  Procedure Laterality Date   ANKLE ARTHROSCOPY WITH DRILLING/MICROFRACTURE Right 01/06/2024   Procedure: ARTHROSCOPY, ANKLE, WITH MICROFRACTURE;  Surgeon: Medicine Juliene JONELLE, DPM;  Location: [facility] ORS;  Service: Orthopedics/Podiatry;  Laterality: Right;   EXCISION MASS LOWER EXTREMETIES Right 11/29/2023   Procedure: EXCISION MASS LOWER EXTREMITIES / excision of bone spur  right tibia;  Surgeon: Margrette Taft BRAVO, MD;  Location: [facility];  Service: Orthopedics;  Laterality: Right;   HERNIA REPAIR     INCISION AND DRAINAGE ABSCESS Left 07/21/2017   Procedure: INCISION AND DRAINAGE INGUINAL HERNIA ABSCESS;  Surgeon: Ebbie Cough, MD;  Location: [facility] OR;  Service: General;  Laterality: Left;   INGUINAL HERNIA REPAIR Bilateral 05/07/2017   Procedure: LAPAROSCOPIC RIGHT  INGUINAL HERNIA  REPAIR AND  OPEN LEFT   INGUINAL EXPLORATION;  Surgeon: Rubin Calamity, MD;  Location: [facility] OR;  Service: General;  Laterality: Bilateral;   INSERTION OF MESH Bilateral 05/07/2017   Procedure: INSERTION OF MESH;  Surgeon: Rubin Calamity, MD;  Location: [facility] OR;  Service: General;  Laterality: Bilateral;   LEG SURGERY Right    bone spur removed from foot   LIGAMENT REPAIR Right 01/06/2024   Procedure: REPAIR, LIGAMENT;  Surgeon: Medicine Juliene JONELLE, DPM;  Location: [facility] OR[facility];  Service: Orthopedics/Podiatry;  Laterality: Right;  GEN WITH POP BLOCK   ORCHIECTOMY Left 05/11/2023   Procedure: LEFT REMOVAL OF INGUINAL MESH;  Surgeon: Rubin Calamity, MD;  Location: [facility];  Service: General;  Laterality: Left;   Patient Active Problem List   Diagnosis Date Noted   Osteochondral defect of talus 01/07/2024   Tear of deltoid ligament of ankle, right, initial encounter 01/07/2024   Ankle instability, right 01/07/2024   Closed avulsion fracture of medial malleolus of right tibia 01/07/2024   Ganglion of right ankle 01/07/2024   Tenosynovitis of tibialis posterior tendon 01/07/2024   Osteochondroma of right tibia s/p excision 11/29/23 12/14/2023   Mass of tibia 11/29/2023   Skin wound from surgical incision 06/10/2022   DM type 2, controlled, with complication (HCC) 08/26/2021   Insomnia 06/23/2019   Anxiety 06/23/2019   Soft  tissue abscess of inguinal region 07/21/2017   Abscess 06/04/2017   Recurrent inguinal hernia 05/07/2017   S/P hernia repair 05/07/2017   Chronic pain of right knee 04/22/2016   PAD (peripheral artery disease) 05/02/2015   Stroke (cerebrum) (HCC) 04/08/2015   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   CVA (cerebral vascular accident) (HCC) 04/03/2015   Seizures (HCC) 04/03/2015   Stroke (HCC) 03/22/2015   HTN (hypertension) 03/22/2015   HLD (hyperlipidemia) 03/22/2015   Prediabetes 03/22/2015   Tobacco use disorder 03/22/2015   Cerebral infarction,  unspecified (HCC) 03/22/2015   [name] (transient ischemic attack) 03/18/2015   History of fracture of ankle    Right ankle pain     PCP: [name]  REFERRING PROVIDER: Silva Juliene SAUNDERS, DPM  REFERRING DIAG:  Diagnosis  [phone] (ICD-10-CM) - Post-operative state  M25.371 (ICD-10-CM) - Ankle instability, right    THERAPY DIAG:  Right ankle pain Right ankle stiffness Difficulty in walking.   Rationale for Evaluation and Treatment: Rehabilitation  ONSET DATE: 01/06/24  SUBJECTIVE:   SUBJECTIVE STATEMENT: 03/05/24: Patient reports he put a door up and his Rt ankle is coming up much better (Rt DF).  Reporting a little soreness today.  Eval: PT states that he had multiple sprains in his Rt ankle due to years of basketball. He is currently putting full weight on his ankle with his cam boot.  PT states that his MD states that he is still not able to walk without the boot.    PERTINENT HISTORY: PT had Rt ankle arthroscopic surgery  on 9/19 for osteochondral defect of the talus and ligament repair.  Pt has a remote hx of CVA and seizures.   PAIN:  Are you having pain? Yes: Pain location:  no rating given today, sore not pain Pain description: burning  Aggravating factors: activity  Relieving factors: rest and pain meds   PRECAUTIONS: Other: WB  RED FLAGS: None   WEIGHT BEARING RESTRICTIONS: Yes WBAT with boot   FALLS:  Has patient fallen in last 6 months? No  LIVING ENVIRONMENT: Lives with: lives with their family Lives in: House/apartment Stairs: Yes: External: 6 steps; on right going up not completing in a reciprocal    OCCUPATION: none  PLOF: Independent  PATIENT GOALS: To be able to be on his feet all day without pain   NEXT MD VISIT: 02/16/24  OBJECTIVE:  Note: Objective measures were completed at Evaluation unless otherwise noted.    PATIENT SURVEYS:  LEFS  Extreme difficulty/unable (0), Quite a bit of difficulty (1), Moderate difficulty (2), Little  difficulty (3), No difficulty (4) Survey date:  02/08/24  Any of your usual work, housework or school activities 1  2. Usual hobbies, recreational or sporting activities 0  3. Getting into/out of the bath 2  4. Walking between rooms 2  5. Putting on socks/shoes 1  6. Squatting  0  7. Lifting an object, like a bag of groceries from the floor 2  8. Performing light activities around your home 2  9. Performing heavy activities around your home 1  10. Getting into/out of a car 2  11. Walking 2 blocks 0  12. Walking 1 mile 0  13. Going up/down 10 stairs (1 flight) 2  14. Standing for 1 hour 1  15.  sitting for 1 hour 4  16. Running on even ground 0  17. Running on uneven ground 0  18. Making sharp turns while running fast 0  19. Hopping  0  20. Rolling over in bed 2  Score total:  22/80     COGNITION: Overall cognitive status: Within functional limits for tasks assessed   EDEMA:  Noted medial  PALPATION: Tender to palpation   LOWER EXTREMITY ROM:  Active ROM Right eval Left eval  Hip flexion    Hip extension    Hip abduction    Hip adduction    Hip internal rotation    Hip external rotation    Knee flexion    Knee extension    Ankle dorsiflexion - 8 12  Ankle plantarflexion 50 40  Ankle inversion 5 20  Ankle eversion 10 12   (Blank rows = not tested)  LOWER EXTREMITY MMT:  MMT Right eval Left eval  Hip flexion    Hip extension    Hip abduction    Hip adduction    Hip internal rotation    Hip external rotation    Knee flexion    Knee extension    Ankle dorsiflexion 4-/5   Ankle plantarflexion 4-   Ankle inversion 4-   Ankle eversion 3+    (Blank rows = not tested)  FUNCTIONAL TESTS:  2 minute walk test: 286 ft in 2 minutes.                                                                                                                                 TREATMENT DATE:  03/05/2024  Stationary bike, seat 16 5 minutes, level 3 resistance, pt cued for  50-60 Standing:  Rt LE heelraises only 2X10 with bil UE assist Gastroc stretch on incline, 30 second holds X 3  Dorsiflexion marches, 5lb kettle bell from 6 inch step, 2X15 BAPS level 3, 15 times A/P, Rt/Lt; 10X each CW/CCW with bil UE assist  Lunges onto BOSU dome up no UE 2X10 each LE leading Lunges onto step no UE 10X each                                   02/28/24 EXERCISE LOG  Exercise Repetitions and Resistance Comments  Bike  L3 x 5 minutes    Standing gastroc stretch   4 x 30 seconds  Resolved all burning in the ankle and calf   Tandem stance on foam   3 x 30 seconds each  Intermittent UE support   Lunges onto BOSU   15 reps each w/ 10 second hold  Ball up; BUE support from parallel bars   Stairs  3 x 4 steps  Reciprocal pattern; increased R hip ABD   Standing heel raise  15 reps  With BUE support from parallel bars   BAPS  L2 x 15 reps each  PF, DF, inversion, and eversion       Blank cell = exercise not performed today   02/21/2024  Therapeutic Exercise: -Stationary bike, 5  minutes, level 2 resistance, pt cued for 50-60 -DF/PF and IN/EV on rocker board, 2 sets of 10 reps bilaterally, pt cued for decreased UE support -Soleus stretch, on 8 inch step, RLE only, 2 bouts of 30 seconds -Gastroc stretch on incline, 30 second holds, pt cued for pain free stretch Neuromuscular Re-education: -Dorsiflexion marches, 5lb kettle bell from 6 inch step, 1 set of 8 reps bilaterally, pt cued for increased dorsiflexion -Baps board, 15 taps each direction, all planes, and 10 circles each direction, pt cued for lateral ankle muscle activation -Staggered stance trampoline toss (NBOS, one LE on floor other on 6 inch step, NBOS on blue foam), 1 sets of 10 throws each variation Therapeutic Activity: -Lateral step up and overs, 1 sets of 5 reps, 6 inch step pt cued for core activation                                    02/14/24 EXERCISE LOG  Exercise Repetitions and Resistance Comments  Gastroc  stretch 4 x 30 seconds  RLE in long sitting   Soleus stretch   4 x 30 seconds  RLE in long sitting   Ankle PF/DF and inversion/eversion   20 reps each  RLE only   BAPS L1 x 3 minutes each  DF/PF and inversion/eversion   Rocker board  3 minutes  With BUE support from parallel bars   Lunges onto step  20 reps  4 step; R foot on step   Nustep  L3 x 5 minutes    Blank cell = exercise not performed today                                    02/10/24 EXERCISE LOG  Exercise Repetitions and Resistance Comments  Patient education  See below    Manual therapy  STM: right gastrocnemius, achilles, and plantar fascia Joint mobilizations: grade I-III subtalar, talocrural, and midfoot PROM: all R ankle planes to tolerance   Ankle circles  20 reps each  CW and CCW  Ankle PF/DF  30 reps each    Ankle inversion/eversion 30 reps each    Nustep   L3 x 5.5 minutes @ seat 12    Blank cell = exercise not performed today    PATIENT EDUCATION:  Education details: healing, importance of his HEP, and goals for physical therapy Person educated: Patient Education method: Explanation Education comprehension: verbalized understanding  HOME EXERCISE PROGRAM: Access Code: [email or web address] URL: [email or web address] Date: 02/08/2024 Prepared by: Montie Metro  Exercises - Long Sitting Ankle Dorsiflexion AROM  - 3 x daily - 7 x weekly - 1 sets - 10 reps - 10 hold - Supine Ankle Inversion Eversion AROM  - 3 x daily - 7 x weekly - 1 sets - 10 reps - 10 hold - Supine Ankle Eversion AROM  - 3 x daily - 7 x weekly - 1 sets - 10 reps - 10 hold - Supine Ankle Circles  - 3 x daily - 7 x weekly - 1 sets - 10 reps  ASSESSMENT:  CLINICAL IMPRESSION: Continued with challenges for knee/ankle stability.  All exercises completed in standing today.  Pt able to complete all new challenges with overall good form/ROM.  Most challenging was CW/CCW BAPS as supination was difficult.  Pt did require cues for posture as  progressively begins to forward  flex with activities.  No pain while completing or at conclusion of session today.  Also added single leg heelraises without noted reduction in ROM Rt as compared to Lt and also some burning due to fatigue.  Patient continues to require skilled physical therapy to address her remaining impairments to return to her prior level of function.    Eval: Patient is a 53 y.o. male who was seen today for physical therapy evaluation and treatment for s/p ligament repair to RT ankle.  PT has decreased ROM, decreased strength, difficulty walking and increased pain in his Rt ankle.  This is the side his stroke affected, the pt states that he has had increased balance problems ever since his stroke.  As the pt is able to be WB without the [place] we will work on his balance but this might be prior level of function. Mr. Volante will benefit from skilled PT to address his deficits and maximize his functioning ability.   OBJECTIVE IMPAIRMENTS: cardiopulmonary status limiting activity, decreased activity tolerance, decreased balance, decreased mobility, difficulty walking, decreased ROM, and decreased strength.   ACTIVITY LIMITATIONS: carrying, lifting, standing, squatting, and locomotion level  PARTICIPATION LIMITATIONS: shopping, community activity, and yard work  PERSONAL FACTORS: 1 comorbidity: CVA are also affecting patient's functional outcome.   REHAB POTENTIAL: Good  CLINICAL DECISION MAKING: Stable/uncomplicated  EVALUATION COMPLEXITY: Moderate   GOALS: Goals reviewed with patient? Yes  SHORT TERM GOALS: Target date: 02/29/24 PT to be I in HEP to improve his ROM and strength of his Rt ankle for improved mobility  Baseline: Goal status: INITIAL  2.  PT to rt ankle pain to be no greater than a 5/10 Baseline:  Goal status: INITIAL  3.   PT LEFS score to be 26/80 Baseline:  Goal status: INITIAL  LONG TERM GOALS: Target date: 03/21/24  PT to be I in advanced  HEP to improve his ROM and strength of his Rt ankle for improved mobility Baseline:  Goal status: INITIAL  2.   PT to rt ankle pain to be no greater than a 3/10 Baseline:  Goal status: INITIAL  3.  PT to be ambulating without his cam boot  Baseline:  Goal status: INITIAL  4.  PT LEFS score to be 30/80 Baseline:  Goal status: INITIAL   PLAN:  PT FREQUENCY: 2x/week  PT DURATION: 6 weeks  PLANNED INTERVENTIONS: 97110-Therapeutic exercises, 97530- Therapeutic activity, 97535- Self Care, [id number]- Manual therapy, and Patient/Family education  PLAN FOR NEXT SESSION: continue with focus on ankle ROM and stabilization.     Greig KATHEE Fuse, PTA/CLT [facility] Ph: [phone]   Fuse Greig KATHEE, PTA 03/05/2024, 10:07 AM 10:07 AM, 03/05/24

## 2024-03-08 ENCOUNTER — Ambulatory Visit (HOSPITAL_COMMUNITY)

## 2024-03-08 ENCOUNTER — Encounter (HOSPITAL_COMMUNITY): Payer: Self-pay

## 2024-03-09 ENCOUNTER — Other Ambulatory Visit: Payer: Self-pay

## 2024-03-12 ENCOUNTER — Ambulatory Visit (HOSPITAL_COMMUNITY): Admitting: Physical Therapy

## 2024-03-12 DIAGNOSIS — R262 Difficulty in walking, not elsewhere classified: Secondary | ICD-10-CM | POA: Diagnosis not present

## 2024-03-12 DIAGNOSIS — R278 Other lack of coordination: Secondary | ICD-10-CM | POA: Diagnosis not present

## 2024-03-12 DIAGNOSIS — M25671 Stiffness of right ankle, not elsewhere classified: Secondary | ICD-10-CM | POA: Diagnosis not present

## 2024-03-12 DIAGNOSIS — M25571 Pain in right ankle and joints of right foot: Secondary | ICD-10-CM | POA: Diagnosis not present

## 2024-03-12 NOTE — Therapy (Signed)
 OUTPATIENT PHYSICAL THERAPY LOWER EXTREMITY TREATMENT   Patient Name: Aaron Kennedy MRN: [id number] DOB:19-Apr-1971, 53 y.o., male Today's Date: 03/12/2024  END OF SESSION:  PT End of Session - 03/12/24 0844     Visit Number 7    Number of Visits 12    Date for Recertification  03/21/24    Authorization Type healthy blue    Authorization Time Period 02/08/24-05/08/24    Authorization - Visit Number 6    Authorization - Number of Visits 13    Progress Note Due on Visit 10    PT Start Time 0818    PT Stop Time 0856    PT Time Calculation (min) 38 min    Activity Tolerance Patient tolerated treatment well    Behavior During Therapy [facility] for tasks assessed/performed               Past Medical History:  Diagnosis Date   Ankle instability, right    Anxiety    Arthritis    Chronic pain of right knee    DM type 2, controlled, with complication (HCC) 08/26/2021   Hypertension    Insomnia 01/2019   Osteochondral defect of talus    PAD (peripheral artery disease)    Peroneal tendinitis, right    Posterior tibial tendonitis, right    Stroke [facility])    right side weakness   Past Surgical History:  Procedure Laterality Date   ANKLE ARTHROSCOPY WITH DRILLING/MICROFRACTURE Right 01/06/2024   Procedure: ARTHROSCOPY, ANKLE, WITH MICROFRACTURE;  Surgeon: Medicine Juliene JONELLE, DPM;  Location: [facility] ORS;  Service: Orthopedics/Podiatry;  Laterality: Right;   EXCISION MASS LOWER EXTREMETIES Right 11/29/2023   Procedure: EXCISION MASS LOWER EXTREMITIES / excision of bone spur  right tibia;  Surgeon: Margrette Taft BRAVO, MD;  Location: [facility] ORS;  Service: Orthopedics;  Laterality: Right;   HERNIA REPAIR     INCISION AND DRAINAGE ABSCESS Left 07/21/2017   Procedure: INCISION AND DRAINAGE INGUINAL HERNIA ABSCESS;  Surgeon: Ebbie Cough, MD;  Location: [facility] OR;  Service: General;  Laterality: Left;   INGUINAL HERNIA REPAIR Bilateral 05/07/2017   Procedure: LAPAROSCOPIC RIGHT  INGUINAL HERNIA  REPAIR AND  OPEN LEFT   INGUINAL EXPLORATION;  Surgeon: Rubin Calamity, MD;  Location: [facility] OR;  Service: General;  Laterality: Bilateral;   INSERTION OF MESH Bilateral 05/07/2017   Procedure: INSERTION OF MESH;  Surgeon: Rubin Calamity, MD;  Location: [facility] OR;  Service: General;  Laterality: Bilateral;   LEG SURGERY Right    bone spur removed from foot   LIGAMENT REPAIR Right 01/06/2024   Procedure: REPAIR, LIGAMENT;  Surgeon: Medicine Juliene JONELLE, DPM;  Location: [facility] ORS;  Service: Orthopedics/Podiatry;  Laterality: Right;  GEN WITH POP BLOCK   ORCHIECTOMY Left 05/11/2023   Procedure: LEFT REMOVAL OF INGUINAL MESH;  Surgeon: Rubin Calamity, MD;  Location: [facility] ORS;  Service: General;  Laterality: Left;   Patient Active Problem List   Diagnosis Date Noted   Osteochondral defect of talus 01/07/2024   Tear of deltoid ligament of ankle, right, initial encounter 01/07/2024   Ankle instability, right 01/07/2024   Closed avulsion fracture of medial malleolus of right tibia 01/07/2024   Ganglion of right ankle 01/07/2024   Tenosynovitis of tibialis posterior tendon 01/07/2024   Osteochondroma of right tibia s/p excision 11/29/23 12/14/2023   Mass of tibia 11/29/2023   Skin wound from surgical incision 06/10/2022   DM type 2, controlled, with complication (HCC) 08/26/2021   Insomnia 06/23/2019   Anxiety 06/23/2019  Soft tissue abscess of inguinal region 07/21/2017   Abscess 06/04/2017   Recurrent inguinal hernia 05/07/2017   S/P hernia repair 05/07/2017   Chronic pain of right knee 04/22/2016   PAD (peripheral artery disease) 05/02/2015   Stroke (cerebrum) (HCC) 04/08/2015   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   CVA (cerebral vascular accident) (HCC) 04/03/2015   Seizures (HCC) 04/03/2015   Stroke (HCC) 03/22/2015   HTN (hypertension) 03/22/2015   HLD (hyperlipidemia) 03/22/2015   Prediabetes 03/22/2015   Tobacco use disorder 03/22/2015   Cerebral infarction,  unspecified (HCC) 03/22/2015   [name] (transient ischemic attack) 03/18/2015   History of fracture of ankle    Right ankle pain     PCP: [name]  REFERRING PROVIDER: Silva Juliene SAUNDERS, DPM  REFERRING DIAG:  Diagnosis  [phone] (ICD-10-CM) - Post-operative state  M25.371 (ICD-10-CM) - Ankle instability, right    THERAPY DIAG:  Right ankle pain Right ankle stiffness Difficulty in walking.   Rationale for Evaluation and Treatment: Rehabilitation  ONSET DATE: 01/06/24  SUBJECTIVE:   SUBJECTIVE STATEMENT: 03/12/24: [place]  States he used a ladder to get up on the roof and clean out the gutters.  Also done some pressure washing. States he's going to play golf today.    Eval: PT states that he had multiple sprains in his Rt ankle due to years of basketball. He is currently putting full weight on his ankle with his cam boot.  PT states that his MD states that he is still not able to walk without the boot.    PERTINENT HISTORY: PT had Rt ankle arthroscopic surgery  on 9/19 for osteochondral defect of the talus and ligament repair.  Pt has a remote hx of CVA and seizures.   PAIN:  Are you having pain? Yes: Pain location:  no rating given today, sore not pain Pain description: burning  Aggravating factors: activity  Relieving factors: rest and pain meds   PRECAUTIONS: Other: WB  RED FLAGS: None   WEIGHT BEARING RESTRICTIONS: Yes WBAT with boot   FALLS:  Has patient fallen in last 6 months? No  LIVING ENVIRONMENT: Lives with: lives with their family Lives in: House/apartment Stairs: Yes: External: 6 steps; on right going up not completing in a reciprocal    OCCUPATION: none  PLOF: Independent  PATIENT GOALS: To be able to be on his feet all day without pain   NEXT MD VISIT: 02/16/24  OBJECTIVE:  Note: Objective measures were completed at Evaluation unless otherwise noted.    PATIENT SURVEYS:  LEFS  Extreme  difficulty/unable (0), Quite a bit of difficulty (1), Moderate difficulty (2), Little difficulty (3), No difficulty (4) Survey date:  02/08/24  Any of your usual work, housework or school activities 1  2. Usual hobbies, recreational or sporting activities 0  3. Getting into/out of the bath 2  4. Walking between rooms 2  5. Putting on socks/shoes 1  6. Squatting  0  7. Lifting an object, like a bag of groceries from the floor 2  8. Performing light activities around your home 2  9. Performing heavy activities around your home 1  10. Getting into/out of a car 2  11. Walking 2 blocks 0  12. Walking 1 mile 0  13. Going up/down 10 stairs (1 flight) 2  14. Standing for 1 hour 1  15.  sitting for 1 hour 4  16. Running on even ground 0  17. Running on uneven ground 0  18. Making sharp turns while running fast 0  19. Hopping  0  20. Rolling over in bed 2  Score total:  22/80     COGNITION: Overall cognitive status: Within functional limits for tasks assessed   EDEMA:  Noted medial  PALPATION: Tender to palpation   LOWER EXTREMITY ROM:  Active ROM Right eval Left eval  Hip flexion    Hip extension    Hip abduction    Hip adduction    Hip internal rotation    Hip external rotation    Knee flexion    Knee extension    Ankle dorsiflexion - 8 12  Ankle plantarflexion 50 40  Ankle inversion 5 20  Ankle eversion 10 12   (Blank rows = not tested)  LOWER EXTREMITY MMT:  MMT Right eval Left eval  Hip flexion    Hip extension    Hip abduction    Hip adduction    Hip internal rotation    Hip external rotation    Knee flexion    Knee extension    Ankle dorsiflexion 4-/5   Ankle plantarflexion 4-   Ankle inversion 4-   Ankle eversion 3+    (Blank rows = not tested)  FUNCTIONAL TESTS:  2 minute walk test: 286 ft in 2 minutes.                                                                                                                                 TREATMENT  DATE:  03/12/2024  Stationary bike, seat 16,  5 minutes, level 3 resistance, pt cued for 50-60 Standing:  Rt LE heelraises only 2X10 with bil UE assist Gastroc stretch on incline, 30 second holds X 3  Dorsiflexion marches, 5lb kettle bell from 6 inch step, 2X15  Lunges onto BOSU dome up no UE 2X10 each LE leading  BAPS level 3 A/P, Rt/Lt 20X each Seated:  BAPS level 3; 20X each CW/CCW with bil UE assist  03/05/2024  Stationary bike, seat 16 5 minutes, level 3 resistance, pt cued for 50-60 Standing:  Rt LE heelraises only 2X10 with bil UE assist Gastroc stretch on incline, 30 second holds X 3  Dorsiflexion marches, 5lb kettle bell from 6 inch step, 2X15 BAPS level 3, 15 times A/P, Rt/Lt; 10X each CW/CCW with bil UE assist  Lunges onto BOSU dome up no UE 2X10 each LE leading Lunges onto step no UE 10X each                                   02/28/24 EXERCISE LOG  Exercise Repetitions and Resistance Comments  Bike  L3 x 5 minutes    Standing gastroc stretch   4 x 30 seconds  Resolved all burning in the ankle and calf   Tandem stance on foam  3 x 30 seconds each  Intermittent UE support   Lunges onto BOSU   15 reps each w/ 10 second hold  Ball up; BUE support from parallel bars   Stairs  3 x 4 steps  Reciprocal pattern; increased R hip ABD   Standing heel raise  15 reps  With BUE support from parallel bars   BAPS  L2 x 15 reps each  PF, DF, inversion, and eversion       Blank cell = exercise not performed today     PATIENT EDUCATION:  Education details: healing, importance of his HEP, and goals for physical therapy Person educated: Patient Education method: Explanation Education comprehension: verbalized understanding  HOME EXERCISE PROGRAM: Access Code: [email or web address] URL: [email or web address] Date: 02/08/2024 Prepared by: Montie Metro  Exercises - Long Sitting Ankle Dorsiflexion AROM  - 3 x daily - 7 x weekly - 1 sets - 10 reps - 10 hold - Supine Ankle  Inversion Eversion AROM  - 3 x daily - 7 x weekly - 1 sets - 10 reps - 10 hold - Supine Ankle Eversion AROM  - 3 x daily - 7 x weekly - 1 sets - 10 reps - 10 hold - Supine Ankle Circles  - 3 x daily - 7 x weekly - 1 sets - 10 reps  ASSESSMENT:  CLINICAL IMPRESSION: Continued with focus on improving Rt ankle ROM and strengthening.  BAPS in standing continues to be most challenging, however reports it was easier to complete this session. CW/CCW BAPS most difficult for patient with resumption of this in seated.  Pt with much improved control in seated.  Continues to require cues for posture as progressively begins to forward flex with activities.  No pain while completing or at conclusion of session today.    Patient continues to require skilled physical therapy to address her remaining impairments to return to her prior level of function.    Eval: Patient is a 53 y.o. male who was seen today for physical therapy evaluation and treatment for s/p ligament repair to RT ankle.  PT has decreased ROM, decreased strength, difficulty walking and increased pain in his Rt ankle.  This is the side his stroke affected, the pt states that he has had increased balance problems ever since his stroke.  As the pt is able to be WB without the [place] we will work on his balance but this might be prior level of function. Mr. Prewitt will benefit from skilled PT to address his deficits and maximize his functioning ability.   OBJECTIVE IMPAIRMENTS: cardiopulmonary status limiting activity, decreased activity tolerance, decreased balance, decreased mobility, difficulty walking, decreased ROM, and decreased strength.   ACTIVITY LIMITATIONS: carrying, lifting, standing, squatting, and locomotion level  PARTICIPATION LIMITATIONS: shopping, community activity, and yard work  PERSONAL FACTORS: 1 comorbidity: CVA are also affecting patient's functional outcome.   REHAB POTENTIAL: Good  CLINICAL DECISION MAKING:  Stable/uncomplicated  EVALUATION COMPLEXITY: Moderate   GOALS: Goals reviewed with patient? Yes  SHORT TERM GOALS: Target date: 02/29/24 PT to be I in HEP to improve his ROM and strength of his Rt ankle for improved mobility  Baseline: Goal status: INITIAL  2.  PT to rt ankle pain to be no greater than a 5/10 Baseline:  Goal status: INITIAL  3.   PT LEFS score to be 26/80 Baseline:  Goal status: INITIAL  LONG TERM GOALS: Target date: 03/21/24  PT to be I in advanced HEP to improve his ROM and strength of  his Rt ankle for improved mobility Baseline:  Goal status: INITIAL  2.   PT to rt ankle pain to be no greater than a 3/10 Baseline:  Goal status: INITIAL  3.  PT to be ambulating without his cam boot  Baseline:  Goal status: INITIAL  4.  PT LEFS score to be 30/80 Baseline:  Goal status: INITIAL   PLAN:  PT FREQUENCY: 2x/week  PT DURATION: 6 weeks  PLANNED INTERVENTIONS: 97110-Therapeutic exercises, 97530- Therapeutic activity, 97535- Self Care, [id number]- Manual therapy, and Patient/Family education  PLAN FOR NEXT SESSION: continue with focus on ankle ROM and stabilization.     Greig KATHEE Fuse, PTA/CLT [facility] Ph: [phone]   Fuse Greig KATHEE JOSETTA 03/12/2024, 8:54 AM 8:54 AM, 03/12/24

## 2024-03-14 ENCOUNTER — Encounter (HOSPITAL_COMMUNITY): Payer: Self-pay

## 2024-03-14 ENCOUNTER — Ambulatory Visit (HOSPITAL_COMMUNITY)

## 2024-03-19 ENCOUNTER — Ambulatory Visit (HOSPITAL_COMMUNITY)

## 2024-03-21 ENCOUNTER — Telehealth (HOSPITAL_COMMUNITY): Payer: Self-pay

## 2024-03-21 ENCOUNTER — Ambulatory Visit (HOSPITAL_COMMUNITY)

## 2024-03-21 NOTE — Telephone Encounter (Signed)
 Pt was called concerning his second no show this morning. Pt states he completely forgot about todays appointment but will be at the next one. Pt reminded about the no show policy, next one will indicate discharged from this episode of care.  Lang Ada, PT, DPT [address] [facility] Office: [phone] 10:24 AM, 03/21/24

## 2024-03-26 ENCOUNTER — Ambulatory Visit: Payer: Self-pay | Admitting: Nurse Practitioner

## 2024-03-26 ENCOUNTER — Telehealth (HOSPITAL_COMMUNITY): Payer: Self-pay

## 2024-03-26 ENCOUNTER — Ambulatory Visit (HOSPITAL_COMMUNITY)

## 2024-03-26 NOTE — Telephone Encounter (Signed)
 Pt was called due to not showing to this mornings appointment. Pt states there must have been some confusion and thought that today's appointment had been canceled and was planning on coming to the appointment on the 11th. Pt reeducated on no show policy, will be switched to scheduling one visit at a time.   Lang Ada, PT, DPT [facility] Office: [phone] 11:31 AM, 03/26/24

## 2024-03-27 ENCOUNTER — Ambulatory Visit: Admitting: Podiatry

## 2024-03-27 ENCOUNTER — Ambulatory Visit

## 2024-03-27 VITALS — Ht 71.0 in | Wt 214.1 lb

## 2024-03-27 DIAGNOSIS — M25371 Other instability, right ankle: Secondary | ICD-10-CM

## 2024-03-29 ENCOUNTER — Ambulatory Visit (HOSPITAL_COMMUNITY)

## 2024-03-31 NOTE — Progress Notes (Signed)
°  Subjective:  Patient ID: Aaron Kennedy Medicine, male    DOB: 30-Oct-1970,  MRN: [id number]  Chief Complaint  Patient presents with   Post-op Follow-up    Rm 3 pov # 4 dos 01/06/24 rt ankle arthroplasty, ligament repairs, tendon repairs with PRP injection given at time of surgery( Aaron Kennedy pt. Pt states a sharp shooting pain in the anterior aspect of the right ankle when walking or prolonged standing.    53 y.o. male returns for post-op check.  Doing well pain continues to improve mostly feels a stiffness, he has finished therapy on his own has one more eval next week  Review of Systems: Negative except as noted in the HPI. Denies N/V/F/Ch.   Objective:  There were no vitals filed for this visit. Body mass index is 29.86 kg/m. Constitutional Well developed. Well nourished.  Vascular Foot warm and well perfused. Capillary refill normal to all digits.  Calf is soft and supple, no posterior calf or knee pain, negative Homans' sign  Neurologic Normal speech. Oriented to person, place, and time. Epicritic sensation to light touch grossly present bilaterally.  Dermatologic His incisions are well-healed and not hypertrophic  Orthopedic: No tenderness on the medial incision, good range of motion, good stability. Slight impinging pain on end ROM DF   Multiple view plain film radiographs: Alignment is maintained no new concerning degeneration, anchor tracts stable Assessment:   1. Ankle instability, right    Plan:  Patient was evaluated and treated and all questions answered.  S/p foot surgery right -Doing well and pleased with stability, I recommended he have his final eval next week and transition to HEP. F/U with me 3 months or sooner if issues. No restrictions other htan avoid impact exercise, low impact OK. WBAT in reg shoes  Return in about 3 months (around 06/25/2024) for ankle surgery f/u (new xrays).

## 2024-04-02 ENCOUNTER — Ambulatory Visit (HOSPITAL_COMMUNITY): Attending: Podiatry

## 2024-04-02 ENCOUNTER — Encounter (HOSPITAL_COMMUNITY): Payer: Self-pay

## 2024-04-02 DIAGNOSIS — M25671 Stiffness of right ankle, not elsewhere classified: Secondary | ICD-10-CM

## 2024-04-02 DIAGNOSIS — M25571 Pain in right ankle and joints of right foot: Secondary | ICD-10-CM | POA: Insufficient documentation

## 2024-04-02 DIAGNOSIS — R262 Difficulty in walking, not elsewhere classified: Secondary | ICD-10-CM

## 2024-04-02 NOTE — Therapy (Signed)
 OUTPATIENT PHYSICAL THERAPY LOWER EXTREMITY TREATMENT  PHYSICAL THERAPY DISCHARGE SUMMARY  Visits from Start of Care: 7  Current functional level related to goals / functional outcomes: Lacking in ROM   Remaining deficits: R ankle ROM   Education / Equipment: See below   Patient agrees to discharge. Patient goals were partially met. Patient is being discharged due to being pleased with the current functional level.  Patient Name: Aaron Kennedy MRN: [id number] DOB:1970-07-09, 53 y.o., male Today's Date: 04/02/2024  END OF SESSION:  PT End of Session - 04/02/24 1034     Visit Number 8    Number of Visits 12    Date for Recertification  03/21/24    Authorization Type [employer]    Authorization Time Period 02/08/24-05/08/24    Authorization - Visit Number 7    Authorization - Number of Visits 13    Progress Note Due on Visit 10    PT Start Time 1035    PT Stop Time 1100 (P)     PT Time Calculation (min) 25 min (P)     Activity Tolerance Patient tolerated treatment well    Behavior During Therapy WFL for tasks assessed/performed               Past Medical History:  Diagnosis Date   Ankle instability, right    Anxiety    Arthritis    Chronic pain of right knee    DM type 2, controlled, with complication (HCC) 08/26/2021   Hypertension    Insomnia 01/2019   Osteochondral defect of talus    PAD (peripheral artery disease)    Peroneal tendinitis, right    Posterior tibial tendonitis, right    Stroke (HCC)    right side weakness   Past Surgical History:  Procedure Laterality Date   ANKLE ARTHROSCOPY WITH DRILLING/MICROFRACTURE Right 01/06/2024   Procedure: ARTHROSCOPY, ANKLE, WITH MICROFRACTURE;  Surgeon: Medicine Juliene JONELLE, DPM;  Location: [facility];  Service: Orthopedics/Podiatry;  Laterality: Right;   EXCISION MASS LOWER EXTREMETIES Right 11/29/2023   Procedure: EXCISION MASS LOWER EXTREMITIES / excision of bone spur  right tibia;  Surgeon: Margrette Taft BRAVO, MD;  Location: [facility];  Service: Orthopedics;  Laterality: Right;   HERNIA REPAIR     INCISION AND DRAINAGE ABSCESS Left 07/21/2017   Procedure: INCISION AND DRAINAGE INGUINAL HERNIA ABSCESS;  Surgeon: Ebbie Cough, MD;  Location: [facility] OR;  Service: General;  Laterality: Left;   INGUINAL HERNIA REPAIR Bilateral 05/07/2017   Procedure: LAPAROSCOPIC RIGHT  INGUINAL HERNIA REPAIR AND  OPEN LEFT   INGUINAL EXPLORATION;  Surgeon: Rubin Calamity, MD;  Location: [facility] OR;  Service: General;  Laterality: Bilateral;   INSERTION OF MESH Bilateral 05/07/2017   Procedure: INSERTION OF MESH;  Surgeon: Rubin Calamity, MD;  Location: [facility] OR;  Service: General;  Laterality: Bilateral;   LEG SURGERY Right    bone spur removed from foot   LIGAMENT REPAIR Right 01/06/2024   Procedure: REPAIR, LIGAMENT;  Surgeon: Medicine Juliene JONELLE, DPM;  Location: [facility];  Service: Orthopedics/Podiatry;  Laterality: Right;  GEN WITH POP BLOCK   ORCHIECTOMY Left 05/11/2023   Procedure: LEFT REMOVAL OF INGUINAL MESH;  Surgeon: Rubin Calamity, MD;  Location: [facility];  Service: General;  Laterality: Left;   Patient Active Problem List   Diagnosis Date Noted   Osteochondral defect of talus 01/07/2024   Tear of deltoid ligament of ankle, right, initial encounter 01/07/2024   Ankle instability, right 01/07/2024   Closed avulsion fracture  of medial malleolus of right tibia 01/07/2024   Ganglion of right ankle 01/07/2024   Tenosynovitis of tibialis posterior tendon 01/07/2024   Osteochondroma of right tibia s/p excision 11/29/23 12/14/2023   Mass of tibia 11/29/2023   Skin wound from surgical incision 06/10/2022   DM type 2, controlled, with complication (HCC) 08/26/2021   Insomnia 06/23/2019   Anxiety 06/23/2019   Soft tissue abscess of inguinal region 07/21/2017   Abscess 06/04/2017   Recurrent inguinal hernia 05/07/2017   S/P hernia repair 05/07/2017   Chronic pain of right knee 04/22/2016   PAD (peripheral  artery disease) 05/02/2015   Stroke (cerebrum) (HCC) 04/08/2015   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   CVA (cerebral vascular accident) (HCC) 04/03/2015   Seizures (HCC) 04/03/2015   Stroke (HCC) 03/22/2015   HTN (hypertension) 03/22/2015   HLD (hyperlipidemia) 03/22/2015   Prediabetes 03/22/2015   Tobacco use disorder 03/22/2015   Cerebral infarction, unspecified (HCC) 03/22/2015   [name] (transient ischemic attack) 03/18/2015   History of fracture of ankle    Right ankle pain     PCP: [name]  REFERRING PROVIDER: Silva Juliene SAUNDERS, DPM  REFERRING DIAG:  Diagnosis  Z98.890 (ICD-10-CM) - Post-operative state  M25.371 (ICD-10-CM) - Ankle instability, right    THERAPY DIAG:  Right ankle pain Right ankle stiffness Difficulty in walking.   Rationale for Evaluation and Treatment: Rehabilitation  ONSET DATE: 01/06/24  SUBJECTIVE:   SUBJECTIVE STATEMENT: Pt states he has had a lot going on the past few weeks and has missed a couple of sessions. Pt states he is still limited with function but mainly due to the stroke. Pt states he is ready for HEP independence.    Eval: PT states that he had multiple sprains in his Rt ankle due to years of basketball. He is currently putting full weight on his ankle with his cam boot.  PT states that his MD states that he is still not able to walk without the boot.    PERTINENT HISTORY: PT had Rt ankle arthroscopic surgery  on 9/19 for osteochondral defect of the talus and ligament repair.  Pt has a remote hx of CVA and seizures.   PAIN:  Are you having pain? Yes: Pain location:  no rating given today, sore not pain Pain description: burning  Aggravating factors: activity  Relieving factors: rest and pain meds   PRECAUTIONS: Other: WB  RED FLAGS: None   WEIGHT BEARING RESTRICTIONS: Yes WBAT with boot   FALLS:  Has patient fallen in last 6 months? No  LIVING ENVIRONMENT: Lives with: lives with their  family Lives in: House/apartment Stairs: Yes: External: 6 steps; on right going up not completing in a reciprocal    OCCUPATION: none  PLOF: Independent  PATIENT GOALS: To be able to be on his feet all day without pain   NEXT MD VISIT: 02/16/24  OBJECTIVE:  Note: Objective measures were completed at Evaluation unless otherwise noted.    PATIENT SURVEYS:  LEFS  Extreme difficulty/unable (0), Quite a bit of difficulty (1), Moderate difficulty (2), Little difficulty (3), No difficulty (4) Survey date:  02/08/24  Any of your usual work, housework or school activities 1  2. Usual hobbies, recreational or sporting activities 0  3. Getting into/out of the bath 2  4. Walking between rooms 2  5. Putting on socks/shoes 1  6. Squatting  0  7. Lifting an object, like a bag of groceries from the floor 2  8. Performing  light activities around your home 2  9. Performing heavy activities around your home 1  10. Getting into/out of a car 2  11. Walking 2 blocks 0  12. Walking 1 mile 0  13. Going up/down 10 stairs (1 flight) 2  14. Standing for 1 hour 1  15.  sitting for 1 hour 4  16. Running on even ground 0  17. Running on uneven ground 0  18. Making sharp turns while running fast 0  19. Hopping  0  20. Rolling over in bed 2  Score total:  22/80     COGNITION: Overall cognitive status: Within functional limits for tasks assessed   EDEMA:  Noted medial  PALPATION: Tender to palpation   LOWER EXTREMITY ROM:  Active ROM Right eval Left eval Right 04/02/24  Hip flexion     Hip extension     Hip abduction     Hip adduction     Hip internal rotation     Hip external rotation     Knee flexion     Knee extension     Ankle dorsiflexion - 8 12 -10  Ankle plantarflexion 50 40 45  Ankle inversion 5 20 10, slight pain  Ankle eversion 10 12 20    (Blank rows = not tested)  LOWER EXTREMITY MMT:  MMT Right eval Left eval Right 04/02/24  Hip flexion     Hip extension      Hip abduction     Hip adduction     Hip internal rotation     Hip external rotation     Knee flexion     Knee extension     Ankle dorsiflexion 4-/5  4  Ankle plantarflexion 4-  4, (22 reps contained)  Ankle inversion 4-  4-  Ankle eversion 3+  4   (Blank rows = not tested)  FUNCTIONAL TESTS:  2 minute walk test: 286 ft in 2 minutes.  04/02/24: : 365, no increased pain in the ankle  SLS 04/02/24: R: 17.18 seconds L: stopped at 30 seconds                                                                                                                               TREATMENT DATE:  04/02/2024  Discharge note: , SLS, strength and ROM assessed. Pt educated on progress, importance of HEP compliance and referral process.   PATIENT EDUCATION:  Education details: healing, importance of his HEP, and goals for physical therapy Person educated: Patient Education method: Explanation Education comprehension: verbalized understanding  HOME EXERCISE PROGRAM: Access Code: [email or web address] URL: [email or web address] Date: 04/02/2024 Prepared by: Lang Ada  Exercises - Long Sitting Ankle Dorsiflexion AROM  - 3 x daily - 7 x weekly - 1 sets - 10 reps - 10 hold - Supine Ankle Inversion Eversion AROM  - 3 x daily - 7 x weekly - 1 sets - 10 reps - 10 hold - Supine Ankle Eversion AROM  -  3 x daily - 7 x weekly - 1 sets - 10 reps - 10 hold - Supine Ankle Circles  - 3 x daily - 7 x weekly - 1 sets - 10 reps - Single Leg Stance  - 1 x daily - 7 x weekly - 1 sets - 3 reps - 30 hold - Gastroc Stretch on Wall  - 1 x daily - 7 x weekly - 1 sets - 2 reps - 30 hold - Standing Heel Raise  - 1 x daily - 7 x weekly - 3 sets - 10 reps - Standard Lunge  - 1 x daily - 7 x weekly - 3 sets - 10 reps  ASSESSMENT:  CLINICAL IMPRESSION: Patient demonstrates significant progress since the start of therapy, able to meet 6/7 goals. Pt does demonstrate increased R ankle strength, increased gait  quality and endurance however R ankle ROM still limited. Patient also demonstrates increased SLS tolerance during today's session. Patient state he is pleased with current level of function and would like to continue rehab independently with updated HEP. Patient to be discharged at this time due to being satisfied with current level of function.    OBJECTIVE IMPAIRMENTS: cardiopulmonary status limiting activity, decreased activity tolerance, decreased balance, decreased mobility, difficulty walking, decreased ROM, and decreased strength.   ACTIVITY LIMITATIONS: carrying, lifting, standing, squatting, and locomotion level  PARTICIPATION LIMITATIONS: shopping, community activity, and yard work  PERSONAL FACTORS: 1 comorbidity: CVA are also affecting patient's functional outcome.   REHAB POTENTIAL: Good  CLINICAL DECISION MAKING: Stable/uncomplicated  EVALUATION COMPLEXITY: Moderate   GOALS: Goals reviewed with patient? Yes  SHORT TERM GOALS: Target date: 02/29/24 PT to be I in HEP to improve his ROM and strength of his Rt ankle for improved mobility  Baseline: Goal status: Progressing  2.  PT to rt ankle pain to be no greater than a 5/10 Baseline: 2/10 on 12/15 Goal status: MET  3.   PT LEFS score to be 26/80 Baseline: 36 / 80 = 45.0 % on 04/02/24 Goal status: MET  LONG TERM GOALS: Target date: 03/21/24  PT to be I in advanced HEP to improve his ROM and strength of his Rt ankle for improved mobility Baseline:  Goal status: MET  2.   PT to rt ankle pain to be no greater than a 3/10 Baseline: 2/10 on 04/02/24 Goal status: MET  3.  PT to be ambulating without his cam boot  Baseline:  Goal status: MET  4.  PT LEFS score to be 30/80 Baseline: 36 / 80 = 45.0 % on 04/02/24 Goal status: MET   PLAN:  PT FREQUENCY: 2x/week  PT DURATION: 6 weeks  PLANNED INTERVENTIONS: 97110-Therapeutic exercises, 97530- Therapeutic activity, 97535- Self Care, [id number]- Manual therapy, and  Patient/Family education  PLAN FOR NEXT SESSION: continue with focus on ankle ROM and stabilization.     Armanie Martine, PT, DPT [facility] Office: [phone] 11:15 AM, 04/02/2024

## 2024-04-05 ENCOUNTER — Ambulatory Visit (HOSPITAL_COMMUNITY)

## 2024-04-08 NOTE — Progress Notes (Deleted)
 HPI [name], followed for minimal OSA, complicated by HTN, DM2, PAD, Hx CVA/ Seizure Disorder,  Tobacco Use HST 07/24/23- AHI 5.9/hr, desat to 84%, body weight 224 lbs PFT 10/06/23- WNL w/o resp to BD ===================================================================================================================       10/06/23- 53 yoM Smoker, followed for minimal OSA, complicated by HTN, DM2, PAD, Hx CVA/ Seizure Disorder,  Tobacco Use HST 07/24/23- AHI 5.9/hr, desat to 84%, body weight 224 lbs CXR and PFT were advised last visit- not yet Body weight today-216 lbs Because of low sats on HST and hx CVA, I had suggested trial of CPAP- not done. PFT 10/06/23- WNL w/o resp to BD Discussed the use of AI scribe software for clinical note transcription with the patient, who gave verbal consent to proceed.  History of Present Illness   Aaron Kennedy is a 53 year old male with a history of stroke who presents for evaluation of sleep apnea.He experiences minimal sleep apnea with episodes occurring five to six times per hour, which is considered borderline. There is a slight drop in oxygen levels during the night. His history of stroke has affected his balance, which may indirectly impact his sleep quality.We discussed his smoking again. Despite normal PFT, he should quit completely.We will order overnight oximetry to double check after sleep study.     Assessment and Plan:    Sleep apnea, mildMild sleep apnea with AHI 5-6/hr. Oxygen desaturation suggests cardiovascular stress. Hesitant about CPAP.- Repeat home sleep test with oxygen meter.- Advise side or stomach sleeping.Diabetes mellitus, type 2- managed elsewhereImproved glycemic control with blood glucose 122-128 mg/dL. Ozempic  aiding weight loss and appetite reduction.StrokeResidual balance issues and toe dragging post-stroke. Adjusted work activities.- Order chest X-ray to assess pulmonary health.    04/10/24- 53 yoM Smoker, followed for minimal  OSA, complicated by HTN, DM2, PAD, Hx CVA/ Seizure Disorder,  Tobacco Use HST 07/24/23- AHI 5.9/hr, desat to 84%, body weight 224 lbs CXR and PFT were advised last visit- not yet Body weight today- Because of low sats on HST and hx CVA, I had suggested trial of CPAP- not done. PFT 10/06/23- WNL w/o resp to BD   CXR 10/06/23 IMPRESSION: No active cardiopulmonary disease.    ROS-see HPI   + = positive Constitutional:    weight loss, night sweats, fevers, chills, fatigue, lassitude. HEENT:    headaches, difficulty swallowing, tooth/dental problems, sore throat,       sneezing, itching, ear ache, nasal congestion, post nasal drip, snoring CV:    chest pain, orthopnea, PND, swelling in lower extremities, anasarca,                                   dizziness, palpitations Resp:   shortness of breath with exertion or at rest.                +productive cough,   non-productive cough, coughing up of blood.              change in color of mucus.  wheezing.   Skin:    rash or lesions. GI:  No-   heartburn, indigestion, abdominal pain, nausea, vomiting, diarrhea,                 change in bowel habits, loss of appetite GU: dysuria, change in color of urine, no urgency or frequency.   flank pain. MS:   joint pain, stiffness, decreased range of motion,  back pain. Neuro-     nothing unusual Psych:  change in mood or affect.  depression or anxiety.   memory loss.  OBJ- Physical Exam General- Alert, Oriented, Affect-appropriate, Distress- none acute, +muscular/ lean Skin- rash-none, lesions- none, excoriation- none, +tanned Lymphadenopathy- none Head- atraumatic            Eyes- Gross vision intact, PERRLA, conjunctivae and secretions clear            Ears- Hearing, canals-normal            Nose- Clear, no-Septal dev, mucus, polyps, erosion, perforation             Throat- Mallampati III , mucosa clear , drainage- none, tonsils- atrophic Neck- flexible , trachea midline, no stridor , thyroid  nl,  carotid no bruit Chest - symmetrical excursion , unlabored           Heart/CV- RRR , no murmur , no gallop  , no rub, nl s1 s2                           - JVD- none , edema- none, stasis changes- none, varices- none           Lung- clear to P&A, wheeze- none, cough+light , dullness-none, rub- none           Chest wall-  Abd-  Br/ Gen/ Rectal- Not done, not indicated Extrem- cyanosis- none, clubbing, none, atrophy- none, strength- nl Neuro- grossly intact to observation

## 2024-04-09 ENCOUNTER — Ambulatory Visit: Payer: Self-pay | Admitting: Nurse Practitioner

## 2024-04-10 ENCOUNTER — Ambulatory Visit: Admitting: Internal Medicine

## 2024-04-13 ENCOUNTER — Ambulatory Visit: Payer: Self-pay | Admitting: Nurse Practitioner

## 2024-04-17 ENCOUNTER — Other Ambulatory Visit (INDEPENDENT_AMBULATORY_CARE_PROVIDER_SITE_OTHER): Payer: Self-pay

## 2024-04-17 DIAGNOSIS — E118 Type 2 diabetes mellitus with unspecified complications: Secondary | ICD-10-CM

## 2024-04-17 NOTE — Progress Notes (Signed)
 "  04/17/2024 Name: Aaron Kennedy MRN: [id number] DOB: January 22, 1971  Chief Complaint  Patient presents with   Diabetes   Hyperlipidemia    Aaron Kennedy is a 53 y.o. year old male who presented for a telephone visit.   They were referred to the pharmacist by their PCP for assistance in managing diabetes and hyperlipidemia. PMH includes T2DM, PAD, Stroke (2016), tobacco use, HFmrEF.  Subjective: Patient was last engaged by pharmacy vis telephone on 09/04/23. He was happy with the improvement in his BG since increasing Ozempic  to 1 mg weekly. No medication changes were made. He saw his PCP, [name], NP on 10/07/23. His A1C had improved from 7.9% to 6.5%. His LDL-C increased from 146 mg/dL to 836 mg/dL. He was engaged by pharmacy via telephone on 10/31/23. He reported doing well. He continued to have difficulty adhering to atorvastatin . He was instructed to start Repatha  140 mg every 14 days. At pharmacy call on 12/01/23, patient reported tolerating Repatha  well. He has since had ankle surgery on 12/27/23. At pharmacy visit on 02/07/24, patient was doing well. Obtained lipid panel which showed LDL-C improved to 41 mg/dL.   Today, patient reports doing ok. His sister just had a stroke, going to visit her. Has all his maintenance medications. His only concern today is that he would like to speak with someone about getting a prescription for clonazepam. He has been taking them for sleep (from a friend's prescription), but his friend is moving and he will not have access anymore. He would like to know how to get his own prescription. States he takes 2 tablets, only at night, to help with sleep and anxiety.  Care Team: Primary Care Provider: [name], NP ; Next Scheduled Visit: 05/05/23  Medication Access/Adherence  Current Pharmacy:  [facility] [address] [place] [address] Phone: [phone] Fax:  [phone]  [facility] [address] [place] [address] Phone: [phone] Fax: [phone]   Patient reports barriers to adherence - he forgets to take PM medications (atorvastatin ), and struggles to take all his medications in the AM because he does not always eat breakfast.   Patient denies transportation barriers to taking his medications.  Patient denies cost barriers to taking his medications  Diabetes:  Current medications: metformin  XR 500 mg PO BID, semaglutide  (Ozempic ) 1 mg subcutaneous weekly (Thursdays)   Current glucose readings: FBG at goal, not checking often  Denies GI AE with Ozempic , still some appetite suppression, but not as severe as before.   Patient denies hypoglycemic s/sx including dizziness, shakiness, sweating. Patient denies hyperglycemic symptoms including polyuria, polydipsia, polyphagia, nocturia, neuropathy. He has stable blurry vision.  Current meal patterns (2 meals/day): trying to avoid fried foods - eating more broiled and grilled foods. Reports sugary and greasy foods no longer taste as good to him. - Breakfast: boiled eggs, sausage, egg, and biscuit - often picks something up from [employer]. - Supper: grilled/broiled shrimp or fish, home fries, broccoli, pork chops + green beans + red potatoes  - Snacks: cucumber with vinegar, watermelon  - Drinks: coffee with creamer. Has mostly been able to cut out sugar. Having zero-sugar gatorade inteasd. Occasional orange juice He does not drink much water  throughout the day.   Hypertension:  Current medications: metoprolol  succinate 25 mg daily, lisinopril /hydrochlorothiazide  10/12.5 mg daily  Hyperlipidemia/ASCVD Risk Reduction  Current lipid lowering medications: atorvastatin  80 mg daily (does not  take regularly), Repatha  140 mg [place] every 14 days - Patient reported some GI upset with atorvastatin  at first but was advised by doctor to take with  food, which helped.   Denies issues with tolerability of Repatha . Denies missed doses.   Antiplatelet regimen: clopidogrel  75 mg daily (has previously reported taking every other day due to thin skin, bleeding, bruising)  ASCVD History: PAD, CVA  Family History: stroke (sister), heart attack (brother) Risk Factors: HLD, HTN, DM, premature ASCVD  The ASCVD Risk score (Arnett DK, et al., 2019) failed to calculate for the following reasons:   Risk score cannot be calculated because patient has a medical history suggesting prior/existing ASCVD   * - Cholesterol units were assumed    Objective:  BP Readings from Last 3 Encounters:  02/07/24 129/79  01/26/24 127/87  01/06/24 (!) 142/64    Lab Results  Component Value Date   HGBA1C 6.6 (H) 11/25/2023   HGBA1C 6.5 (A) 10/07/2023   HGBA1C 7.9 (A) 06/23/2023    Lab Results  Component Value Date   CREATININE 0.80 11/25/2023   BUN 12 11/25/2023   NA 136 11/25/2023   K 3.8 11/25/2023   CL 105 11/25/2023   CO2 21 (L) 11/25/2023    Lab Results  Component Value Date   CHOL 94 (L) 02/07/2024   HDL 38 (L) 02/07/2024   LDLCALC 41 02/07/2024   TRIG 72 02/07/2024   CHOLHDL 2.5 02/07/2024    BP Readings from Last 3 Encounters:  02/07/24 129/79  01/26/24 127/87  01/06/24 (!) 142/64    Medications Reviewed Today     Reviewed by Brinda Lorain SQUIBB, RPH-CPP (Pharmacist) on 04/17/24 at [id number]  Med List Status: <None>   Medication Order Taking? Sig Documenting Provider Last Dose Status Informant  Accu-Chek Softclix Lancets lancets [id number]  Check up to twice daily. Oley Bascom RAMAN, NP  Active Self  atorvastatin  (LIPITOR) 80 MG tablet [id number]  Take 1 tablet (80 mg total) by mouth daily. Oley Bascom RAMAN, NP  Active Self           Med Note CLAUD, YOLINDA T   Tue Nov 22, 2023 10:20 AM)    Blood Glucose Monitoring Suppl (BLOOD GLUCOSE MONITOR SYSTEM) w/Device KIT [id number]  Use as directed up to twice daily. Oley Bascom RAMAN, NP  Active  Self  clopidogrel  (PLAVIX ) 75 MG tablet [id number] Yes Take 1 tablet (75 mg total) by mouth daily. Oley Bascom RAMAN, NP  Active Self           Med Note CLAUD, MICHEAL T   Tue Dec 27, 2023  9:35 AM)    cyclobenzaprine  (FLEXERIL ) 10 MG tablet [id number]  Take 1 tablet (10 mg total) by mouth 3 (three) times daily as needed for muscle spasms. Oley Bascom RAMAN, NP  Active   Evolocumab  (REPATHA  SURECLICK) 140 MG/ML EMMANUEL [id number] Yes Inject 140 mg into the skin every 14 (fourteen) days. Oley Bascom RAMAN, NP  Active Self           Med Note CLAUD, MICHEAL DASEN   Tue Dec 27, 2023  9:34 AM) Next dose due:12/29/23  gabapentin  (NEURONTIN ) 600 MG tablet [id number]  Take 1 tablet (600 mg total) by mouth 2 (two) times daily. Oley Bascom RAMAN, NP  Expired 04/08/24 2359   Glucose Blood (BLOOD GLUCOSE TEST STRIPS) STRP [id number]  Check up to twice daily. Oley Bascom RAMAN, NP  Active Self  Lancet Device MISC [id number]  Check up to twice daily. May substitute  to any manufacturer covered by patient's insurance. Nichols, Tonya S, NP  Active Self  lisinopril -hydrochlorothiazide  (ZESTORETIC ) 10-12.5 MG tablet [id number] Yes TAKE 1 TABLET BY MOUTH DAILY. Oley Bascom RAMAN, NP  Active   metFORMIN  (GLUCOPHAGE -XR) 500 MG 24 hr tablet [id number] Yes Take 2 tablets (1,000 mg total) by mouth daily with breakfast. Nichols, Tonya S, NP  Active   metoprolol  succinate (TOPROL -XL) 25 MG 24 hr tablet [id number] Yes Take 1 tablet (25 mg total) by mouth daily. Oley Bascom RAMAN, NP  Active   NON FORMULARY [id number]  Take 2 mg by mouth at bedtime. Klonopin 1 mg [provider]  Active Self           Med Note CLAUD, MICHEAL DASEN   Tue Dec 27, 2023  9:39 AM) Patient reports taking 2 mg of Klonopin (not prescribed to him) nightly, using a friend's medication. Disclosure made due to upcoming anesthesia and the controlled nature of the drug.  Semaglutide , 1 MG/DOSE, (OZEMPIC , 1 MG/DOSE,) 4 MG/3ML SOPN [id number] Yes Inject 1 mg as directed once a  week. Oley Bascom RAMAN, NP  Active             Assessment/Plan:   Diabetes: - Currently controlled based on last A1c 6.6%, below goal < 7%. Patient is a greatcandidate for continued tx with GLP-1RA given ASCVD and BMI 31. 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 Can consider in the future if additional glycemic control is needed.  - UACR 02/07/24 - 16 mg/g - Reviewed long term cardiovascular and renal outcomes of uncontrolled blood sugar - Reviewed goal A1c, goal fasting, and goal 2 hour post prandial glucose - Reviewed dietary modifications including to improve blood sugar control. - Recommend to continue metformin  XR 1000 mg daily in the morning. - Recommend to continue Ozempic  1 mg weekly.  - Recommend to check glucose in the morning while fasting and two-hours after a meal a couple times per week - Next A1C due now   Hypertension: - Currently controlled with clinic BP readings below goal < 130/80 mmHg. Appropriate to continue current regimen - Reviewed long term cardiovascular and renal outcomes of uncontrolled blood pressure - Reviewed appropriate blood pressure monitoring technique and reviewed goal blood pressure. Recommended to check home blood pressure and heart rate periodically - Recommend to continue metoprolol  succinate 25 mg daily and lisinopril /hydrochlorothiazide  10/12.5 mg daily   Hyperlipidemia/ASCVD Risk Reduction: - Currently controlled based on most recent LDL of 41 mg/dL (improved from 836 mg/dL) below goal < 55 mg/dL since starting Repatha . He has spotty adherence to statin. Appropriate to continue current regimen. - Reviewed long term complications of uncontrolled cholesterol - Recommend to continue atorvastatin  80 mg daily with food  - Recommend to continue Repatha  140 mg injected into the skin once every 14 days.    Advised patient on risks/benefits of prolonged  use of clonazepam for sleep. Advised to discuss with PCP at upcoming appt on 05/05/23.     Follow Up Plan: PCP 05/05/23, Pharmacist telephone 06/27/23   Lorain Baseman, PharmD [facility] [phone]    "

## 2024-05-04 ENCOUNTER — Encounter: Payer: Self-pay | Admitting: Nurse Practitioner

## 2024-05-04 ENCOUNTER — Ambulatory Visit: Admitting: Nurse Practitioner

## 2024-05-04 ENCOUNTER — Telehealth: Payer: Self-pay | Admitting: Nurse Practitioner

## 2024-05-04 VITALS — BP 123/77 | HR 76 | Temp 98.1°F | Wt 260.4 lb

## 2024-05-04 DIAGNOSIS — E118 Type 2 diabetes mellitus with unspecified complications: Secondary | ICD-10-CM

## 2024-05-04 DIAGNOSIS — F419 Anxiety disorder, unspecified: Secondary | ICD-10-CM | POA: Diagnosis not present

## 2024-05-04 LAB — POCT GLYCOSYLATED HEMOGLOBIN (HGB A1C): Hemoglobin A1C: 7 % — AB (ref 4.0–5.6)

## 2024-05-04 NOTE — Telephone Encounter (Signed)
 SABRA

## 2024-05-04 NOTE — Progress Notes (Signed)
 "  Subjective   Patient ID: Aaron Kennedy, male    DOB: [birth year], 54 y.o.   MRN: [id number]  Chief Complaint  Patient presents with   Diabetes    6 month. Need a referral to psch. Need a note for work at [employer].     Referring provider: Oley Aaron RAMAN, NP  [name] Medicine is a 54 y.o. male with Past Medical History: No date: Ankle instability, right No date: Anxiety No date: Arthritis No date: Chronic pain of right knee 08/26/2021: DM type 2, controlled, with complication (HCC) No date: Hypertension 01/2019: Insomnia No date: Osteochondral defect of talus No date: PAD (peripheral artery disease) No date: Peroneal tendinitis, right No date: Posterior tibial tendonitis, right No date: Stroke Aaron Kennedy)     Comment:  right side weakness   HPI  Patient presents today for follow-up visit.  He did recently have ankle surgery Maitri foot and ankle.  Overall doing well after surgery.  Patient does request a referral to psychiatry today for anxiety medication.  Overall patient is doing well vital signs are stable in office today. A1c was 7.0. Denies f/c/s, n/v/d, hemoptysis, PND, leg swelling. Denies chest pain or edema.    Allergies[1]  Immunization History  Administered Date(s) Administered   Influenza, Seasonal, Injecte, Preservative Fre 12/23/2022   Influenza,inj,Quad PF,6+ Mos 12/29/2015, 02/06/2019, 02/25/2021, 03/01/2022   Pneumococcal Polysaccharide-23 09/26/2015   Tdap 12/29/2015    Tobacco History: Tobacco Use History[2] Ready to quit: Not Answered Counseling given: Not Answered   Outpatient Encounter Medications as of 05/04/2024  Medication Sig   Accu-Chek Softclix Lancets lancets Check up to twice daily.   Blood Glucose Monitoring Suppl (BLOOD GLUCOSE MONITOR SYSTEM) w/Device KIT Use as directed up to twice daily.   clopidogrel  (PLAVIX ) 75 MG tablet Take 1 tablet (75 mg total) by mouth daily.   cyclobenzaprine  (FLEXERIL ) 10 MG tablet Take 1 tablet (10 mg total)  by mouth 3 (three) times daily as needed for muscle spasms.   Evolocumab  (REPATHA  SURECLICK) 140 MG/ML SOAJ Inject 140 mg into the skin every 14 (fourteen) days.   gabapentin  (NEURONTIN ) 600 MG tablet Take 1 tablet (600 mg total) by mouth 2 (two) times daily.   Glucose Blood (BLOOD GLUCOSE TEST STRIPS) STRP Check up to twice daily.   Lancet Device MISC Check up to twice daily. May substitute to any manufacturer covered by patient's insurance.   lisinopril -hydrochlorothiazide  (ZESTORETIC ) 10-12.5 MG tablet TAKE 1 TABLET BY MOUTH DAILY.   metFORMIN  (GLUCOPHAGE -XR) 500 MG 24 hr tablet Take 2 tablets (1,000 mg total) by mouth daily with breakfast.   metoprolol  succinate (TOPROL -XL) 25 MG 24 hr tablet Take 1 tablet (25 mg total) by mouth daily.   NON FORMULARY Take 2 mg by mouth at bedtime. Klonopin 1 mg   Semaglutide , 1 MG/DOSE, (OZEMPIC , 1 MG/DOSE,) 4 MG/3ML SOPN Inject 1 mg as directed once a week.   atorvastatin  (LIPITOR) 80 MG tablet Take 1 tablet (80 mg total) by mouth daily. (Patient not taking: Reported on 05/04/2024)   No facility-administered encounter medications on file as of 05/04/2024.    Review of Systems  Review of Systems  Constitutional: Negative.   HENT: Negative.    Cardiovascular: Negative.   Gastrointestinal: Negative.   Allergic/Immunologic: Negative.   Neurological: Negative.   Psychiatric/Behavioral: Negative.       Objective:   BP 123/77   Pulse 76   Temp 98.1 F (36.7 C) (Temporal)   Wt 260 lb 6.4 oz (118.1 kg)  SpO2 93%   BMI 36.32 kg/m   Wt Readings from Last 5 Encounters:  05/04/24 260 lb 6.4 oz (118.1 kg)  03/27/24 214 lb 1.1 oz (97.1 kg)  02/16/24 214 lb 1.1 oz (97.1 kg)  01/06/24 214 lb 1.1 oz (97.1 kg)  01/03/24 214 lb (97.1 kg)     Physical Exam Vitals and nursing note reviewed.  Constitutional:      General: He is not in acute distress.    Appearance: He is well-developed.  Cardiovascular:     Rate and Rhythm: Normal rate and regular  rhythm.  Pulmonary:     Effort: Pulmonary effort is normal.     Breath sounds: Normal breath sounds.  Skin:    General: Skin is warm and dry.  Neurological:     Mental Status: He is alert and oriented to person, place, and time.       Assessment & Plan:   DM type 2, controlled, with complication (HCC) -     POCT glycosylated hemoglobin (Hb A1C) -     CBC -     Comprehensive metabolic panel with GFR -     Lipid panel  Anxiety -     Ambulatory referral to Psychiatry     Return in about 3 months (around 08/02/2024).    Aaron GORMAN Borer, NP 05/04/2024     [1] No Known Allergies [2]  Social History Tobacco Use  Smoking Status Every Day   Current packs/day: 0.50   Average packs/day: 0.5 packs/day for 20.0 years (10.0 ttl pk-yrs)   Types: Cigarettes  Smokeless Tobacco Never   "

## 2024-05-04 NOTE — Telephone Encounter (Signed)
 Open encounter by mistake

## 2024-05-05 LAB — COMPREHENSIVE METABOLIC PANEL WITH GFR
ALT: 24 IU/L (ref 0–44)
AST: 14 IU/L (ref 0–40)
Albumin: 4.5 g/dL (ref 3.8–4.9)
Alkaline Phosphatase: 109 IU/L (ref 47–123)
BUN/Creatinine Ratio: 10 (ref 9–20)
BUN: 8 mg/dL (ref 6–24)
Bilirubin Total: 0.5 mg/dL (ref 0.0–1.2)
CO2: 20 mmol/L (ref 20–29)
Calcium: 8.9 mg/dL (ref 8.7–10.2)
Chloride: 102 mmol/L (ref 96–106)
Creatinine, Ser: 0.84 mg/dL (ref 0.76–1.27)
Globulin, Total: 2.2 g/dL (ref 1.5–4.5)
Glucose: 95 mg/dL (ref 70–99)
Potassium: 4.2 mmol/L (ref 3.5–5.2)
Sodium: 138 mmol/L (ref 134–144)
Total Protein: 6.7 g/dL (ref 6.0–8.5)
eGFR: 104 mL/min/1.73

## 2024-05-05 LAB — LIPID PANEL
Chol/HDL Ratio: 3.7 ratio (ref 0.0–5.0)
Cholesterol, Total: 116 mg/dL (ref 100–199)
HDL: 31 mg/dL — ABNORMAL LOW
LDL Chol Calc (NIH): 69 mg/dL (ref 0–99)
Triglycerides: 76 mg/dL (ref 0–149)
VLDL Cholesterol Cal: 16 mg/dL (ref 5–40)

## 2024-05-05 LAB — CBC
Hematocrit: 49.9 % (ref 37.5–51.0)
Hemoglobin: 16.2 g/dL (ref 13.0–17.7)
MCH: 33.5 pg — ABNORMAL HIGH (ref 26.6–33.0)
MCHC: 32.5 g/dL (ref 31.5–35.7)
MCV: 103 fL — ABNORMAL HIGH (ref 79–97)
Platelets: 221 x10E3/uL (ref 150–450)
RBC: 4.83 x10E6/uL (ref 4.14–5.80)
RDW: 13.7 % (ref 11.6–15.4)
WBC: 8.2 x10E3/uL (ref 3.4–10.8)

## 2024-05-07 ENCOUNTER — Ambulatory Visit: Payer: Self-pay | Admitting: Nurse Practitioner

## 2024-06-26 ENCOUNTER — Ambulatory Visit: Admitting: Podiatry

## 2024-06-26 ENCOUNTER — Other Ambulatory Visit: Payer: Self-pay

## 2024-07-03 ENCOUNTER — Encounter

## 2024-07-25 ENCOUNTER — Ambulatory Visit (HOSPITAL_COMMUNITY): Payer: Self-pay

## 2024-08-02 ENCOUNTER — Ambulatory Visit: Payer: Self-pay | Admitting: Nurse Practitioner

## 2024-08-13 ENCOUNTER — Ambulatory Visit: Admitting: Nurse Practitioner

## 2024-09-19 ENCOUNTER — Ambulatory Visit: Admitting: Physician Assistant
